# Patient Record
Sex: Female | Born: 1935 | Race: White | Hispanic: No | Smoking: Never smoker
Health system: Southern US, Community
[De-identification: ages and names within clinical notes are randomized; demographics above are authoritative.]

## PROBLEM LIST (undated history)

## (undated) DIAGNOSIS — E785 Hyperlipidemia, unspecified: Secondary | ICD-10-CM

## (undated) DIAGNOSIS — F411 Generalized anxiety disorder: Secondary | ICD-10-CM

## (undated) DIAGNOSIS — M8580 Other specified disorders of bone density and structure, unspecified site: Secondary | ICD-10-CM

## (undated) DIAGNOSIS — Z9289 Personal history of other medical treatment: Secondary | ICD-10-CM

## (undated) DIAGNOSIS — D62 Acute posthemorrhagic anemia: Secondary | ICD-10-CM

## (undated) DIAGNOSIS — J45909 Unspecified asthma, uncomplicated: Secondary | ICD-10-CM

## (undated) DIAGNOSIS — M1990 Unspecified osteoarthritis, unspecified site: Secondary | ICD-10-CM

## (undated) DIAGNOSIS — Z9889 Other specified postprocedural states: Secondary | ICD-10-CM

## (undated) DIAGNOSIS — I2510 Atherosclerotic heart disease of native coronary artery without angina pectoris: Secondary | ICD-10-CM

## (undated) DIAGNOSIS — K56600 Partial intestinal obstruction, unspecified as to cause: Secondary | ICD-10-CM

## (undated) DIAGNOSIS — E039 Hypothyroidism, unspecified: Secondary | ICD-10-CM

## (undated) DIAGNOSIS — G250 Essential tremor: Secondary | ICD-10-CM

## (undated) DIAGNOSIS — I10 Essential (primary) hypertension: Secondary | ICD-10-CM

## (undated) DIAGNOSIS — J309 Allergic rhinitis, unspecified: Secondary | ICD-10-CM

## (undated) DIAGNOSIS — F419 Anxiety disorder, unspecified: Secondary | ICD-10-CM

## (undated) DIAGNOSIS — I25810 Atherosclerosis of coronary artery bypass graft(s) without angina pectoris: Secondary | ICD-10-CM

## (undated) DIAGNOSIS — A419 Sepsis, unspecified organism: Secondary | ICD-10-CM

## (undated) DIAGNOSIS — K219 Gastro-esophageal reflux disease without esophagitis: Secondary | ICD-10-CM

## (undated) DIAGNOSIS — G894 Chronic pain syndrome: Secondary | ICD-10-CM

## (undated) DIAGNOSIS — J189 Pneumonia, unspecified organism: Secondary | ICD-10-CM

## (undated) DIAGNOSIS — G4700 Insomnia, unspecified: Secondary | ICD-10-CM

## (undated) DIAGNOSIS — R5381 Other malaise: Secondary | ICD-10-CM

## (undated) DIAGNOSIS — E119 Type 2 diabetes mellitus without complications: Secondary | ICD-10-CM

## (undated) HISTORY — DX: Unspecified osteoarthritis, unspecified site: M19.90

## (undated) HISTORY — DX: Other malaise: R53.81

## (undated) HISTORY — DX: Other specified disorders of bone density and structure, unspecified site: M85.80

## (undated) HISTORY — DX: Hyperlipidemia, unspecified: E78.5

## (undated) HISTORY — DX: Acute posthemorrhagic anemia: D62

## (undated) HISTORY — DX: Gastro-esophageal reflux disease without esophagitis: K21.9

## (undated) HISTORY — DX: Personal history of other medical treatment: Z92.89

## (undated) HISTORY — DX: Other specified postprocedural states: Z98.890

## (undated) HISTORY — DX: Allergic rhinitis, unspecified: J30.9

## (undated) HISTORY — DX: Essential tremor: G25.0

## (undated) HISTORY — DX: Generalized anxiety disorder: F41.1

## (undated) HISTORY — DX: Chronic pain syndrome: G89.4

## (undated) HISTORY — DX: Anxiety disorder, unspecified: F41.9

## (undated) HISTORY — DX: Sepsis, unspecified organism: J18.9

## (undated) HISTORY — DX: Atherosclerotic heart disease of native coronary artery without angina pectoris: I25.10

## (undated) HISTORY — DX: Partial intestinal obstruction, unspecified as to cause: K56.600

## (undated) HISTORY — DX: Hypothyroidism, unspecified: E03.9

## (undated) HISTORY — DX: Atherosclerosis of coronary artery bypass graft(s) without angina pectoris: I25.810

## (undated) HISTORY — DX: Sepsis, unspecified organism: A41.9

## (undated) HISTORY — DX: Essential (primary) hypertension: I10

## (undated) HISTORY — DX: Insomnia, unspecified: G47.00

---
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
## 1958-08-20 HISTORY — PX: DILATION AND CURETTAGE OF UTERUS: SHX78

## 1968-08-20 HISTORY — PX: OTHER SURGICAL HISTORY: SHX169

## 1968-08-20 HISTORY — PX: BREAST LUMPECTOMY: SHX2

## 1969-08-20 HISTORY — PX: VESICOVAGINAL FISTULA CLOSURE W/ TAH: SUR271

## 1978-08-20 HISTORY — PX: NASAL SINUS SURGERY: SHX719

## 1997-12-03 ENCOUNTER — Ambulatory Visit (HOSPITAL_COMMUNITY): Admission: RE | Admit: 1997-12-03 | Discharge: 1997-12-03 | Payer: Self-pay | Admitting: Family Medicine

## 1997-12-07 ENCOUNTER — Encounter: Admission: RE | Admit: 1997-12-07 | Discharge: 1997-12-07 | Payer: Self-pay | Admitting: Family Medicine

## 1997-12-07 ENCOUNTER — Ambulatory Visit (HOSPITAL_COMMUNITY): Admission: RE | Admit: 1997-12-07 | Discharge: 1997-12-07 | Payer: Self-pay | Admitting: Family Medicine

## 1997-12-15 ENCOUNTER — Encounter: Admission: RE | Admit: 1997-12-15 | Discharge: 1997-12-15 | Payer: Self-pay | Admitting: Family Medicine

## 1997-12-28 ENCOUNTER — Encounter: Admission: RE | Admit: 1997-12-28 | Discharge: 1997-12-28 | Payer: Self-pay | Admitting: Family Medicine

## 1998-01-06 ENCOUNTER — Encounter: Admission: RE | Admit: 1998-01-06 | Discharge: 1998-01-06 | Payer: Self-pay | Admitting: Family Medicine

## 1998-01-20 ENCOUNTER — Encounter: Admission: RE | Admit: 1998-01-20 | Discharge: 1998-01-20 | Payer: Self-pay | Admitting: Family Medicine

## 1998-02-15 ENCOUNTER — Encounter: Admission: RE | Admit: 1998-02-15 | Discharge: 1998-02-15 | Payer: Self-pay | Admitting: Family Medicine

## 1998-03-01 ENCOUNTER — Encounter: Admission: RE | Admit: 1998-03-01 | Discharge: 1998-03-01 | Payer: Self-pay | Admitting: Family Medicine

## 1998-03-29 ENCOUNTER — Encounter: Admission: RE | Admit: 1998-03-29 | Discharge: 1998-03-29 | Payer: Self-pay | Admitting: Family Medicine

## 1998-04-28 ENCOUNTER — Encounter: Admission: RE | Admit: 1998-04-28 | Discharge: 1998-04-28 | Payer: Self-pay | Admitting: Family Medicine

## 1998-05-24 ENCOUNTER — Encounter: Admission: RE | Admit: 1998-05-24 | Discharge: 1998-05-24 | Payer: Self-pay | Admitting: Family Medicine

## 1998-06-21 ENCOUNTER — Encounter: Admission: RE | Admit: 1998-06-21 | Discharge: 1998-06-21 | Payer: Self-pay | Admitting: Family Medicine

## 1998-07-12 ENCOUNTER — Encounter: Admission: RE | Admit: 1998-07-12 | Discharge: 1998-07-12 | Payer: Self-pay | Admitting: Family Medicine

## 1998-07-26 ENCOUNTER — Encounter: Admission: RE | Admit: 1998-07-26 | Discharge: 1998-07-26 | Payer: Self-pay | Admitting: Family Medicine

## 1998-07-28 ENCOUNTER — Encounter: Admission: RE | Admit: 1998-07-28 | Discharge: 1998-07-28 | Payer: Self-pay | Admitting: Family Medicine

## 1998-08-18 ENCOUNTER — Emergency Department (HOSPITAL_COMMUNITY): Admission: EM | Admit: 1998-08-18 | Discharge: 1998-08-18 | Payer: Self-pay | Admitting: Emergency Medicine

## 1998-08-23 ENCOUNTER — Encounter: Admission: RE | Admit: 1998-08-23 | Discharge: 1998-08-23 | Payer: Self-pay | Admitting: Family Medicine

## 1998-08-30 ENCOUNTER — Encounter: Admission: RE | Admit: 1998-08-30 | Discharge: 1998-08-30 | Payer: Self-pay | Admitting: Sports Medicine

## 1998-09-22 ENCOUNTER — Ambulatory Visit (HOSPITAL_COMMUNITY): Admission: RE | Admit: 1998-09-22 | Discharge: 1998-09-22 | Payer: Self-pay | Admitting: Cardiology

## 1998-09-26 DIAGNOSIS — Z9889 Other specified postprocedural states: Secondary | ICD-10-CM

## 1998-09-26 HISTORY — DX: Other specified postprocedural states: Z98.890

## 1998-09-30 ENCOUNTER — Observation Stay (HOSPITAL_COMMUNITY): Admission: AD | Admit: 1998-09-30 | Discharge: 1998-10-01 | Payer: Self-pay | Admitting: Cardiovascular Disease

## 1998-09-30 DIAGNOSIS — Z9889 Other specified postprocedural states: Secondary | ICD-10-CM

## 1998-09-30 HISTORY — DX: Other specified postprocedural states: Z98.890

## 1998-10-11 ENCOUNTER — Encounter: Admission: RE | Admit: 1998-10-11 | Discharge: 1998-10-11 | Payer: Self-pay | Admitting: Family Medicine

## 1998-10-19 ENCOUNTER — Encounter: Admission: RE | Admit: 1998-10-19 | Discharge: 1998-10-19 | Payer: Self-pay | Admitting: Family Medicine

## 1998-10-28 ENCOUNTER — Encounter: Admission: RE | Admit: 1998-10-28 | Discharge: 1998-10-28 | Payer: Self-pay | Admitting: Family Medicine

## 1998-11-15 ENCOUNTER — Encounter: Admission: RE | Admit: 1998-11-15 | Discharge: 1998-11-15 | Payer: Self-pay | Admitting: Family Medicine

## 1998-11-29 ENCOUNTER — Encounter: Admission: RE | Admit: 1998-11-29 | Discharge: 1998-11-29 | Payer: Self-pay | Admitting: Family Medicine

## 1998-12-13 ENCOUNTER — Encounter: Admission: RE | Admit: 1998-12-13 | Discharge: 1998-12-13 | Payer: Self-pay | Admitting: Sports Medicine

## 1998-12-27 ENCOUNTER — Encounter: Admission: RE | Admit: 1998-12-27 | Discharge: 1998-12-27 | Payer: Self-pay | Admitting: Family Medicine

## 1999-01-04 ENCOUNTER — Ambulatory Visit (HOSPITAL_COMMUNITY): Admission: RE | Admit: 1999-01-04 | Discharge: 1999-01-04 | Payer: Self-pay | Admitting: Family Medicine

## 1999-01-04 ENCOUNTER — Encounter: Payer: Self-pay | Admitting: Family Medicine

## 1999-01-17 ENCOUNTER — Inpatient Hospital Stay (HOSPITAL_COMMUNITY): Admission: AD | Admit: 1999-01-17 | Discharge: 1999-01-23 | Payer: Self-pay | Admitting: Family Medicine

## 1999-01-17 ENCOUNTER — Encounter: Admission: RE | Admit: 1999-01-17 | Discharge: 1999-01-17 | Payer: Self-pay | Admitting: Family Medicine

## 1999-01-17 ENCOUNTER — Encounter: Payer: Self-pay | Admitting: Family Medicine

## 1999-01-17 ENCOUNTER — Ambulatory Visit (HOSPITAL_COMMUNITY): Admission: RE | Admit: 1999-01-17 | Discharge: 1999-01-17 | Payer: Self-pay | Admitting: Family Medicine

## 1999-01-19 ENCOUNTER — Encounter: Payer: Self-pay | Admitting: Family Medicine

## 1999-02-07 ENCOUNTER — Encounter: Admission: RE | Admit: 1999-02-07 | Discharge: 1999-02-07 | Payer: Self-pay | Admitting: Family Medicine

## 1999-02-20 ENCOUNTER — Encounter: Admission: RE | Admit: 1999-02-20 | Discharge: 1999-02-20 | Payer: Self-pay | Admitting: Family Medicine

## 1999-02-28 ENCOUNTER — Encounter: Admission: RE | Admit: 1999-02-28 | Discharge: 1999-02-28 | Payer: Self-pay | Admitting: Family Medicine

## 1999-03-21 ENCOUNTER — Encounter: Admission: RE | Admit: 1999-03-21 | Discharge: 1999-03-21 | Payer: Self-pay | Admitting: Family Medicine

## 1999-04-04 ENCOUNTER — Encounter: Admission: RE | Admit: 1999-04-04 | Discharge: 1999-04-04 | Payer: Self-pay | Admitting: Family Medicine

## 1999-04-06 ENCOUNTER — Emergency Department (HOSPITAL_COMMUNITY): Admission: EM | Admit: 1999-04-06 | Discharge: 1999-04-06 | Payer: Self-pay | Admitting: Emergency Medicine

## 1999-04-06 ENCOUNTER — Encounter: Payer: Self-pay | Admitting: Emergency Medicine

## 1999-05-02 ENCOUNTER — Encounter: Admission: RE | Admit: 1999-05-02 | Discharge: 1999-05-02 | Payer: Self-pay | Admitting: Family Medicine

## 1999-05-23 ENCOUNTER — Encounter: Admission: RE | Admit: 1999-05-23 | Discharge: 1999-05-23 | Payer: Self-pay | Admitting: Sports Medicine

## 1999-06-06 ENCOUNTER — Encounter: Admission: RE | Admit: 1999-06-06 | Discharge: 1999-06-06 | Payer: Self-pay | Admitting: Family Medicine

## 1999-07-04 ENCOUNTER — Encounter: Admission: RE | Admit: 1999-07-04 | Discharge: 1999-07-04 | Payer: Self-pay | Admitting: Family Medicine

## 1999-08-01 ENCOUNTER — Encounter: Admission: RE | Admit: 1999-08-01 | Discharge: 1999-08-01 | Payer: Self-pay | Admitting: Sports Medicine

## 1999-08-24 ENCOUNTER — Encounter: Admission: RE | Admit: 1999-08-24 | Discharge: 1999-08-24 | Payer: Self-pay | Admitting: Family Medicine

## 1999-08-29 ENCOUNTER — Encounter: Admission: RE | Admit: 1999-08-29 | Discharge: 1999-08-29 | Payer: Self-pay | Admitting: Family Medicine

## 1999-09-26 ENCOUNTER — Encounter: Admission: RE | Admit: 1999-09-26 | Discharge: 1999-09-26 | Payer: Self-pay | Admitting: Family Medicine

## 1999-11-28 ENCOUNTER — Encounter: Admission: RE | Admit: 1999-11-28 | Discharge: 1999-11-28 | Payer: Self-pay | Admitting: Family Medicine

## 2000-01-31 ENCOUNTER — Encounter: Admission: RE | Admit: 2000-01-31 | Discharge: 2000-01-31 | Payer: Self-pay | Admitting: Family Medicine

## 2000-02-01 ENCOUNTER — Encounter: Payer: Self-pay | Admitting: Family Medicine

## 2000-02-01 ENCOUNTER — Ambulatory Visit (HOSPITAL_COMMUNITY): Admission: RE | Admit: 2000-02-01 | Discharge: 2000-02-01 | Payer: Self-pay | Admitting: Family Medicine

## 2000-03-20 ENCOUNTER — Ambulatory Visit (HOSPITAL_COMMUNITY): Admission: RE | Admit: 2000-03-20 | Discharge: 2000-03-20 | Payer: Self-pay | Admitting: Gastroenterology

## 2000-03-20 ENCOUNTER — Encounter: Payer: Self-pay | Admitting: Gastroenterology

## 2000-03-26 ENCOUNTER — Encounter: Admission: RE | Admit: 2000-03-26 | Discharge: 2000-03-26 | Payer: Self-pay | Admitting: Family Medicine

## 2000-04-19 ENCOUNTER — Encounter: Admission: RE | Admit: 2000-04-19 | Discharge: 2000-07-18 | Payer: Self-pay | Admitting: Gastroenterology

## 2000-05-29 ENCOUNTER — Encounter: Admission: RE | Admit: 2000-05-29 | Discharge: 2000-05-29 | Payer: Self-pay | Admitting: Family Medicine

## 2000-06-11 ENCOUNTER — Encounter: Admission: RE | Admit: 2000-06-11 | Discharge: 2000-06-11 | Payer: Self-pay | Admitting: Family Medicine

## 2000-06-17 ENCOUNTER — Encounter: Payer: Self-pay | Admitting: Family Medicine

## 2000-06-17 ENCOUNTER — Encounter: Admission: RE | Admit: 2000-06-17 | Discharge: 2000-06-17 | Payer: Self-pay | Admitting: Family Medicine

## 2000-06-18 ENCOUNTER — Encounter: Admission: RE | Admit: 2000-06-18 | Discharge: 2000-06-18 | Payer: Self-pay | Admitting: Family Medicine

## 2000-07-30 ENCOUNTER — Encounter: Admission: RE | Admit: 2000-07-30 | Discharge: 2000-07-30 | Payer: Self-pay | Admitting: Family Medicine

## 2000-09-10 ENCOUNTER — Encounter: Admission: RE | Admit: 2000-09-10 | Discharge: 2000-09-10 | Payer: Self-pay | Admitting: Family Medicine

## 2000-09-17 ENCOUNTER — Encounter: Admission: RE | Admit: 2000-09-17 | Discharge: 2000-09-17 | Payer: Self-pay | Admitting: Internal Medicine

## 2000-09-17 ENCOUNTER — Encounter: Payer: Self-pay | Admitting: Internal Medicine

## 2000-10-08 ENCOUNTER — Encounter: Admission: RE | Admit: 2000-10-08 | Discharge: 2000-10-08 | Payer: Self-pay | Admitting: Family Medicine

## 2000-11-05 ENCOUNTER — Encounter: Admission: RE | Admit: 2000-11-05 | Discharge: 2000-11-05 | Payer: Self-pay | Admitting: Family Medicine

## 2000-12-31 ENCOUNTER — Encounter: Admission: RE | Admit: 2000-12-31 | Discharge: 2000-12-31 | Payer: Self-pay | Admitting: Family Medicine

## 2001-03-11 ENCOUNTER — Encounter: Admission: RE | Admit: 2001-03-11 | Discharge: 2001-03-11 | Payer: Self-pay | Admitting: Family Medicine

## 2001-03-12 ENCOUNTER — Encounter: Payer: Self-pay | Admitting: Family Medicine

## 2001-03-12 ENCOUNTER — Ambulatory Visit (HOSPITAL_COMMUNITY): Admission: RE | Admit: 2001-03-12 | Discharge: 2001-03-12 | Payer: Self-pay | Admitting: Family Medicine

## 2001-05-06 ENCOUNTER — Encounter: Admission: RE | Admit: 2001-05-06 | Discharge: 2001-05-06 | Payer: Self-pay | Admitting: Family Medicine

## 2001-07-15 ENCOUNTER — Encounter: Admission: RE | Admit: 2001-07-15 | Discharge: 2001-07-15 | Payer: Self-pay | Admitting: Family Medicine

## 2001-09-30 ENCOUNTER — Encounter: Admission: RE | Admit: 2001-09-30 | Discharge: 2001-09-30 | Payer: Self-pay | Admitting: Family Medicine

## 2001-11-25 ENCOUNTER — Encounter: Admission: RE | Admit: 2001-11-25 | Discharge: 2001-11-25 | Payer: Self-pay | Admitting: Family Medicine

## 2001-12-24 ENCOUNTER — Encounter: Admission: RE | Admit: 2001-12-24 | Discharge: 2001-12-24 | Payer: Self-pay | Admitting: Family Medicine

## 2001-12-24 ENCOUNTER — Encounter: Payer: Self-pay | Admitting: Family Medicine

## 2001-12-31 ENCOUNTER — Encounter: Admission: RE | Admit: 2001-12-31 | Discharge: 2001-12-31 | Payer: Self-pay | Admitting: Family Medicine

## 2002-01-08 ENCOUNTER — Encounter: Admission: RE | Admit: 2002-01-08 | Discharge: 2002-01-08 | Payer: Self-pay | Admitting: Family Medicine

## 2002-02-09 ENCOUNTER — Encounter: Admission: RE | Admit: 2002-02-09 | Discharge: 2002-02-09 | Payer: Self-pay | Admitting: Family Medicine

## 2002-03-16 ENCOUNTER — Encounter: Admission: RE | Admit: 2002-03-16 | Discharge: 2002-03-16 | Payer: Self-pay | Admitting: Family Medicine

## 2002-04-28 ENCOUNTER — Encounter: Admission: RE | Admit: 2002-04-28 | Discharge: 2002-04-28 | Payer: Self-pay | Admitting: Family Medicine

## 2002-06-16 ENCOUNTER — Encounter: Admission: RE | Admit: 2002-06-16 | Discharge: 2002-06-16 | Payer: Self-pay | Admitting: Family Medicine

## 2002-07-14 ENCOUNTER — Encounter: Admission: RE | Admit: 2002-07-14 | Discharge: 2002-07-14 | Payer: Self-pay | Admitting: Family Medicine

## 2002-08-18 ENCOUNTER — Encounter: Admission: RE | Admit: 2002-08-18 | Discharge: 2002-08-18 | Payer: Self-pay | Admitting: Family Medicine

## 2003-08-21 HISTORY — PX: REVISION TOTAL KNEE ARTHROPLASTY: SHX767

## 2003-08-22 ENCOUNTER — Inpatient Hospital Stay (HOSPITAL_COMMUNITY): Admission: EM | Admit: 2003-08-22 | Discharge: 2003-08-22 | Payer: Self-pay | Admitting: Emergency Medicine

## 2003-11-24 ENCOUNTER — Encounter
Admission: RE | Admit: 2003-11-24 | Discharge: 2004-02-22 | Payer: Self-pay | Admitting: Physical Medicine & Rehabilitation

## 2004-03-07 ENCOUNTER — Inpatient Hospital Stay (HOSPITAL_COMMUNITY): Admission: RE | Admit: 2004-03-07 | Discharge: 2004-03-10 | Payer: Self-pay | Admitting: Orthopedic Surgery

## 2004-03-10 ENCOUNTER — Inpatient Hospital Stay (HOSPITAL_COMMUNITY)
Admission: RE | Admit: 2004-03-10 | Discharge: 2004-03-16 | Payer: Self-pay | Admitting: Physical Medicine & Rehabilitation

## 2004-08-24 ENCOUNTER — Ambulatory Visit: Payer: Self-pay | Admitting: Internal Medicine

## 2004-09-14 ENCOUNTER — Ambulatory Visit: Payer: Self-pay | Admitting: Internal Medicine

## 2004-10-20 ENCOUNTER — Ambulatory Visit: Payer: Self-pay | Admitting: Internal Medicine

## 2004-10-30 ENCOUNTER — Inpatient Hospital Stay (HOSPITAL_COMMUNITY): Admission: RE | Admit: 2004-10-30 | Discharge: 2004-11-02 | Payer: Self-pay | Admitting: Orthopedic Surgery

## 2005-01-16 ENCOUNTER — Ambulatory Visit: Payer: Self-pay | Admitting: Internal Medicine

## 2005-03-22 ENCOUNTER — Encounter: Admission: RE | Admit: 2005-03-22 | Discharge: 2005-03-22 | Payer: Self-pay | Admitting: Orthopedic Surgery

## 2005-04-27 ENCOUNTER — Encounter: Admission: RE | Admit: 2005-04-27 | Discharge: 2005-04-27 | Payer: Self-pay | Admitting: General Surgery

## 2005-05-24 ENCOUNTER — Ambulatory Visit: Payer: Self-pay | Admitting: Internal Medicine

## 2005-11-15 ENCOUNTER — Ambulatory Visit: Payer: Self-pay | Admitting: Internal Medicine

## 2005-11-22 ENCOUNTER — Inpatient Hospital Stay (HOSPITAL_COMMUNITY): Admission: RE | Admit: 2005-11-22 | Discharge: 2005-11-26 | Payer: Self-pay | Admitting: Orthopedic Surgery

## 2005-11-22 ENCOUNTER — Ambulatory Visit: Payer: Self-pay | Admitting: Physical Medicine & Rehabilitation

## 2006-03-03 ENCOUNTER — Emergency Department (HOSPITAL_COMMUNITY): Admission: EM | Admit: 2006-03-03 | Discharge: 2006-03-03 | Payer: Self-pay | Admitting: Emergency Medicine

## 2006-05-16 ENCOUNTER — Ambulatory Visit: Payer: Self-pay | Admitting: Internal Medicine

## 2006-12-20 ENCOUNTER — Ambulatory Visit: Payer: Self-pay | Admitting: Internal Medicine

## 2007-07-08 DIAGNOSIS — J302 Other seasonal allergic rhinitis: Secondary | ICD-10-CM

## 2007-07-08 DIAGNOSIS — F419 Anxiety disorder, unspecified: Secondary | ICD-10-CM

## 2007-07-08 DIAGNOSIS — J4520 Mild intermittent asthma, uncomplicated: Secondary | ICD-10-CM | POA: Insufficient documentation

## 2007-07-08 DIAGNOSIS — M129 Arthropathy, unspecified: Secondary | ICD-10-CM | POA: Insufficient documentation

## 2007-07-08 DIAGNOSIS — I2510 Atherosclerotic heart disease of native coronary artery without angina pectoris: Secondary | ICD-10-CM | POA: Insufficient documentation

## 2007-07-08 DIAGNOSIS — J3089 Other allergic rhinitis: Secondary | ICD-10-CM

## 2007-07-08 DIAGNOSIS — E119 Type 2 diabetes mellitus without complications: Secondary | ICD-10-CM | POA: Insufficient documentation

## 2007-07-23 ENCOUNTER — Encounter: Payer: Self-pay | Admitting: Internal Medicine

## 2007-07-23 ENCOUNTER — Ambulatory Visit: Payer: Self-pay | Admitting: Internal Medicine

## 2007-08-05 ENCOUNTER — Telehealth (INDEPENDENT_AMBULATORY_CARE_PROVIDER_SITE_OTHER): Payer: Self-pay | Admitting: *Deleted

## 2007-10-16 DIAGNOSIS — I2510 Atherosclerotic heart disease of native coronary artery without angina pectoris: Secondary | ICD-10-CM | POA: Insufficient documentation

## 2007-11-04 DIAGNOSIS — F411 Generalized anxiety disorder: Secondary | ICD-10-CM | POA: Insufficient documentation

## 2007-11-07 ENCOUNTER — Encounter: Admission: RE | Admit: 2007-11-07 | Discharge: 2007-11-07 | Payer: Self-pay | Admitting: Family Medicine

## 2007-12-22 DIAGNOSIS — Z9889 Other specified postprocedural states: Secondary | ICD-10-CM

## 2007-12-22 HISTORY — DX: Other specified postprocedural states: Z98.890

## 2008-03-04 ENCOUNTER — Encounter: Admission: RE | Admit: 2008-03-04 | Discharge: 2008-03-04 | Payer: Self-pay | Admitting: Family Medicine

## 2008-03-18 ENCOUNTER — Encounter: Admission: RE | Admit: 2008-03-18 | Discharge: 2008-03-18 | Payer: Self-pay | Admitting: Family Medicine

## 2008-03-30 DIAGNOSIS — Z9289 Personal history of other medical treatment: Secondary | ICD-10-CM

## 2008-03-30 HISTORY — DX: Personal history of other medical treatment: Z92.89

## 2009-05-24 DIAGNOSIS — J309 Allergic rhinitis, unspecified: Secondary | ICD-10-CM | POA: Insufficient documentation

## 2010-06-22 ENCOUNTER — Encounter: Payer: Self-pay | Admitting: Internal Medicine

## 2010-07-16 ENCOUNTER — Observation Stay (HOSPITAL_COMMUNITY)
Admission: EM | Admit: 2010-07-16 | Discharge: 2010-07-18 | Payer: Self-pay | Source: Home / Self Care | Admitting: Emergency Medicine

## 2010-07-17 ENCOUNTER — Encounter: Payer: Self-pay | Admitting: Internal Medicine

## 2010-09-21 NOTE — Letter (Signed)
Summary: [facility]  [facility]   Imported By: Sherian Rein 07/07/2010 09:20:50  _____________________________________________________________________  External Attachment:    Type:   Image     Comment:   External Document

## 2010-10-31 LAB — CBC
HCT: 38.8 % (ref 36.0–46.0)
Hemoglobin: 12.3 g/dL (ref 12.0–15.0)
MCHC: 31.7 g/dL (ref 30.0–36.0)
MCV: 82.4 fL (ref 78.0–100.0)
RDW: 14 % (ref 11.5–15.5)

## 2010-10-31 LAB — DIFFERENTIAL
Basophils Relative: 0 % (ref 0–1)
Eosinophils Relative: 4 % (ref 0–5)
Monocytes Absolute: 0.8 10*3/uL (ref 0.1–1.0)
Monocytes Relative: 11 % (ref 3–12)
Neutro Abs: 3.6 10*3/uL (ref 1.7–7.7)

## 2010-10-31 LAB — URINALYSIS, ROUTINE W REFLEX MICROSCOPIC
Bilirubin Urine: NEGATIVE
Ketones, ur: NEGATIVE mg/dL
Nitrite: NEGATIVE
Urobilinogen, UA: 0.2 mg/dL (ref 0.0–1.0)
pH: 5.5 (ref 5.0–8.0)

## 2010-10-31 LAB — BASIC METABOLIC PANEL
BUN: 20 mg/dL (ref 6–23)
BUN: 22 mg/dL (ref 6–23)
CO2: 30 mEq/L (ref 19–32)
Calcium: 9.3 mg/dL (ref 8.4–10.5)
Chloride: 102 mEq/L (ref 96–112)
Chloride: 99 mEq/L (ref 96–112)
Creatinine, Ser: 0.72 mg/dL (ref 0.4–1.2)
Creatinine, Ser: 0.86 mg/dL (ref 0.4–1.2)
GFR calc Af Amer: >60 mL/min (ref >60)
GFR calc non Af Amer: >60 mL/min (ref >60)
Glucose, Bld: 93 mg/dL (ref 70–99)
Potassium: 3.4 mEq/L — ABNORMAL LOW (ref 3.5–5.1)
Sodium: 140 mEq/L (ref 135–145)

## 2010-10-31 LAB — GLUCOSE, CAPILLARY
Glucose-Capillary: 127 mg/dL — ABNORMAL HIGH (ref 70–99)
Glucose-Capillary: 142 mg/dL — ABNORMAL HIGH (ref 70–99)
Glucose-Capillary: 147 mg/dL — ABNORMAL HIGH (ref 70–99)
Glucose-Capillary: 164 mg/dL — ABNORMAL HIGH (ref 70–99)

## 2010-10-31 LAB — POCT CARDIAC MARKERS
Myoglobin, poc: 117 ng/mL (ref 12–200)
Myoglobin, poc: 167 ng/mL (ref 12–200)
Troponin i, poc: <0.05 ng/mL (ref 0.00–0.09)
Troponin i, poc: <0.05 ng/mL (ref 0.00–0.09)

## 2010-10-31 LAB — URINE CULTURE
Colony Count: NO GROWTH
Culture  Setup Time: 201111281748

## 2010-10-31 LAB — HEMOGLOBIN A1C
Hgb A1c MFr Bld: 6.8 % — ABNORMAL HIGH (ref <5.7)
Mean Plasma Glucose: 148 mg/dL — ABNORMAL HIGH (ref <117)

## 2010-10-31 LAB — URINE MICROSCOPIC-ADD ON

## 2010-10-31 LAB — MAGNESIUM: Magnesium: 2 mg/dL (ref 1.5–2.5)

## 2011-01-02 NOTE — Assessment & Plan Note (Signed)
[facility]                             PULMONARY OFFICE NOTE   NAME:RAYLEBristyn, [name]                        MRN:          [id number]  DATE:07/23/2007                            DOB:          05-22-1925    PROBLEM:  1. Allergic rhinitis.  2. Asthmatic bronchitis.  3. Coronary disease.  4. Diabetes.  5. Arthritis.  6. Anxiety.   HISTORY:  Sore throat for the past 2 weeks.  About a week ago she had  stomach upset and stomach still burns a little.  Less nausea now.  Never  did vomit very much.  Has had some global headache.  She began wheezing  about 2 days ago, coughing some green sputum, and she is treating that  with Mucinex and her home nebulizer.  She has had some cortisone in each  ankle a while back.  Had a flu shot.   MEDICATION:  Meds were reviewed without changes noted.   OBJECTIVE:  BP 122/80, pulse 87, room air saturation 94%, weight 207.8  pounds.  She is overweight, not really in any distress but there is some  dry cough and mild bilateral wheeze noted today.  No accessory muscle  use or dullness, no neck vein distention or edema.  HEART SOUNDS:  Regular without murmur.   IMPRESSION:  Exacerbation of asthmatic bronchitis.   PLAN:  1. Z-Pak.  2. Depo-Medrol 80 mg IM.  3. Xopenex 1.25 mg neb treatment.  4. Home meds include nebulizer with albuterol and ipratropium.  5. Schedule return in 6 months, but earlier p.r.n.     Clinton D. Maple Hudson, MD, Tonny Bollman, FACP  Electronically Signed    CDY/MedQ  DD: 07/26/2007  DT: 07/27/2007  Job #: [id number]   cc:   Veverly Fells. Altheimer, M.D.

## 2011-01-05 NOTE — Assessment & Plan Note (Signed)
[facility]                             PULMONARY OFFICE NOTE   NAME:[name], [name]                        MRN:          [id number]  DATE:12/20/2006                            DOB:          Sep 05, 1924    PROBLEM:  1. Allergic rhinitis.  2. Asthmatic bronchitis.  3. Coronary disease.  4. Diabetes.  5. Arthritis.  6. Anxiety.   HISTORY:  She feels her asthma is doing fairly well this spring. Not  bothered by pollen exposures but she is staying in a great deal. Her  activity and outdoor exposure are limited because of her chronic knee  problems.  There have been no acute events and no significant changes.  Medications were reviewed without change.   OBJECTIVE:  Weight 206 pounds, BP 128/70, pulse regular, 71. Room air  saturation 95%.  She is obese, moving with a cane. Nasal airway is clear  with no post nasal drainage or stridor.   Chest is clear and quiet, breathing is unlabored.  Heart sounds are  regular without murmur.   IMPRESSION:  Asthmatic bronchitis and previously significant allergic  rhinitis, not currently a problem.   PLAN:  We have refilled her albuterol rescue inhaler for p.r.n. use.  Schedule to return in 6 months, earlier p.r.n.     Clinton D. Maple Hudson, MD, Tonny Bollman, FACP  Electronically Signed    CDY/MedQ  DD: 12/22/2006  DT: 12/22/2006  Job #: [id number]   cc:   Veverly Fells. Altheimer, M.D.

## 2011-01-05 NOTE — Procedures (Signed)
[facility]  Patient:    [name], [name]                          MRN: [id number] Proc. Date: 03/20/00 Attending:  Florencia Reasons, M.D. CC:         Deniece Portela A. Sheffield Slider, M.D.             Thereasa Solo. Little, M.D.             Clinton D. Maple Hudson, M.D.                           Procedure Report  PROCEDURE PERFORMED:  Screening colonoscopy (attempted).  ENDOSCOPIST:  Florencia Reasons, M.D.  INDICATIONS:  A 77 year old for cancer screening.  There has been a subtle change in bowel habits recently.  There is also a remote history of ischemic colitis.  FINDINGS:  Sigmoid fixation with inability to pass the scope beyond the region of the distal descending colon.  DESCRIPTION OF PROCEDURE:  The nature, purpose and risk of the procedure have been discussed with the patient and she provided written consent.  Sedation for this procedure and the upper endoscopy which preceded it totalled fentanyl 150 mcg and Versed 10 mg IV without arrhythmias or desaturation (she went down to about 90% periodically during the procedure).  The procedure was initiated with the Olympus adult video colonoscope which was able to be advanced to about 50 cm or so and felt to correspond to perhaps the proximal sigmoid region.  However, the scope would seem to "lock" as I tried to advance it beyond that point.  It is not clear whether this is simply due to fixation or some form of looping, but I switched to the pediatric colonoscope which essentially the same results, and therefore decided to terminate the procedure.  Up to the limit of the exam, this was a normal exam without evidence of polyps, cancer, colitis, vascular malformations or diverticular disease, and there were no obvious post-ischemic strictures.  Retroflexion of the rectum was unremarkable although there were some mild to moderate internal hemorrhoids present, best seen during pull out through the anal canal.  No biopsies were  obtained.  The patient tolerated the procedure well and there were no apparent complications.  IMPRESSION:  Normal limited exam of the colon.  Unable to get beyond the region of the distal descending colon due to apparent fixation and/or looping of the scope.  PLAN:  Proceed to single-column barium enema while the patient is prepped, for screening purposes. DD:  03/20/00 TD:  03/21/00 Job: [id number]

## 2011-01-05 NOTE — Discharge Summary (Signed)
[name], [name]                 ACCOUNT NO.:  [id number]   MEDICAL RECORD NO.:  [id number]          PATIENT TYPE:  INP   LOCATION:  0454                         FACILITY:  [facility]   PHYSICIAN:  Annette Frankel. Charlann [name], M.D.  DATE OF BIRTH:  December 07, 1924   DATE OF ADMISSION:  10/30/2004  DATE OF DISCHARGE:  11/02/2004                                 DISCHARGE SUMMARY   ADMITTING DIAGNOSES:  1.  Right knee arthrofibrosis, associated with patella clunk phenomenon.  2.  Hypertension.  3.  Coronary artery disease.  4.  Chronic obstructive pulmonary disease.  5.  Anxiety.  6.  Acid reflux.  7.  Diabetes.  8.  Hypothyroidism.  9.  Osteoarthritis.   DISCHARGE DIAGNOSES:  1.  Right knee arthrofibrosis, associated with patella clunk phenomenon.  2.  Hypertension.  3.  Coronary artery disease.  4.  Chronic obstructive pulmonary disease.  5.  Anxiety.  6.  Acid reflux.  7.  Diabetes.  8.  Hypothyroidism.  9.  Osteoarthritis.   OPERATION:  On October 30, 2004 the patient underwent right knee diagnostic  and operative arthroscopy, with extensive tricompartmental synovectomy and  debridement of scar tissue; re-establishing suprapatellar pouch, medial and  lateral gutters and anterior aspect of the knee.   BRIEF HISTORY:  This 77 year old lady underwent right total knee replacement  arthroplasty in or about July 2005.  Recently she had mechanical symptoms of  the knees, primarily with flexion extension.  Patellar clunk was noted on  examination.  She is quite uncomfortable and had an essentially  deteriorating situation after her total knee last year.  She is now for  surgical intervention.   HOSPITAL COURSE:  The patient tolerated the surgical procedure quite well.  She was placed on CPM.  She was ambulating in the hall, doing quite well  with her knee.  The main complaint was low back pain, consistent and  persistent and radiating to both buttocks.  She had a negative straight-leg  raise.   Sensory was intact.  Motor was intact in the lower extremities.   X-rays taken of the lumbar spine revealed degenerative disk disease, with no  acute abnormality.   It was felt that as long as her back was concerned, she could follow up with  one of our spine surgeons (as far as her knee was concerned).  Home Health  was arranged.  On the day of discharge she was given a Medrol dose pack --  primarily for her back as well as muscle relaxers.  We started the Medrol  dose pack the day prior to discharge; she improved as far has her back was  concerned.  She had minimal discomfort there.   She will start out with outpatient in our office on November 09, 2004.  The  wound was dry.  Neurovascular was intact in the operative extremity.   LABORATORY VALUES:  (In the hospital)  Hematologically showed a very mild  normocytic/normochromic hemoglobin and anemia; with a hemoglobin at 11.7,  hematocrit 35.6.  Blood chemistries were essentially normal.  A back x-ray  showed degenerative  disk disease, and no acute abnormality.  No  electrocardiogram or chest x-ray seen on this chart.   CONDITION ON DISCHARGE:  Improved.   PLAN:  The patient is discharged to her home.  Continue with weightbearing  as tolerated.  Followup with Dr. Otelia Sergeant for her back.  Return to see Dr. Charlann [name]  in two weeks after the date of surgery.  She is to continue with her home  medications and diet.  Vicodin is given for discomfort, and Robaxin 750 mg  as a muscle relaxant.  She is to continue with her Medrol dose pack; which  was started in the hospital.  Call with any problems.  Do a dry dressing  p.r.n.Marland Kitchen      DLU/MEDQ  D:  11/13/2004  T:  11/13/2004  Job:  [id number]

## 2011-01-05 NOTE — H&P (Signed)
NAMESHARETHA, NEWSON                           ACCOUNT NO.:  [id number]   MEDICAL RECORD NO.:  [id number]                   PATIENT TYPE:  INP   LOCATION:  NA                                   [facility]ACILITY[facility]  [facility]   PHYSICIAN:  Madlyn Frankel. Charlann Boxer, M.D.               DATE OF BIRTH:  [birth year]   DATE OF ADMISSION:  DATE OF DISCHARGE:                                HISTORY & PHYSICAL   DATE OF ADMISSION:  March 07, 2004   CHIEF COMPLAINT:  Right knee pain.   HISTORY OF PRESENT ILLNESS:  The patient is a 77 year old female who Dr.  Charlann Boxer has been following for an considerable amount of time.  She was last  seen in the office on Jan 18, 2004 when in prior visits she was noted to  have bone-on-bone degenerative changes primarily in the medial compartment  of the right knee.  She has previously undergone conservative treatment  including steroid injections and antiinflammatories which has not helped her  symptoms at all.  She is getting some relief from a Lidoderm patch that she  has obtained from a pain clinic; however, she continues to have symptoms.  Radiographs in the office revealed bone-on-bone arthritis in the medial  compartment.  Upon reviewing these x-rays and the patient's failed  conservative measures, Dr. Charlann Boxer feels at this point it is best to proceed  with a right total knee arthroplasty and the patient agrees.  The risks and  benefits of the surgery were discussed with the patient and the patient  wishes to proceed.   PAST MEDICAL HISTORY:  1. Gastroesophageal reflux disease.  2. Hypercholesterolemia.  3. Hypertension.  4. Diabetes mellitus.  5. Hypothyroidism.  6. Asthma.  7. Irritable bowel syndrome.   PAST SURGICAL HISTORY:  1. Appendectomy.  2. Hysterectomy.  3. Bladder tack.  4. Two breast surgeries.  5. Cataract surgery on the left.  6. Angioplasty.   MEDICATIONS:  1. Chlorazepate 15 mg one p.o. daily.  2. Astelin dose and schedule not known.  3. Advair 100/50  inhaled b.i.d.  4. Prilosec dosage and schedule not known.  5. Singulair 10 mg p.o. q.h.s.  6. Lipitor 40 mg p.o. q.h.s.  7. Lexapro 10 mg p.o. q.h.s.  8. Toprol-XL 50 mg p.o. q.h.s.  9. Micardis HCT 80 mg/12.5 mg one p.o. q.a.m.  10.      Zetia 10 mg one p.o. q.a.m.  11.      Actos 30 mg one p.o. q.a.m.  12.      Wellbutrin XL 150 mg one p.o. q.a.m.  13.      Isosorbide mononitrate 30 mg one p.o. q.a.m.  14.      Synthroid 75 mcg one p.o. q.a.m.  15.      Albuterol inhaler 17 mcg p.r.n.  16.      NitroQuick 0.4 mg p.r.n.  17.  Mucinex 600 mg schedule not known.   ALLERGIES:  PHENOBARBITAL causes hives.   SOCIAL HISTORY:  The patient denies any tobacco or alcohol use.  She is a  widow.  She lives in a one-story house with two steps entering her house and  her son and granddaughter will be caregivers after surgery.   FAMILY MEDICAL HISTORY:  Coronary artery disease, hypertension,  cerebrovascular accident, osteoarthritis.   REVIEW OF SYSTEMS:  GENERAL:  Denies fevers, chills, night sweats, bleeding  tendencies.  CNS:  Denies blurry or double vision, seizures, headaches,  paralysis.  RESPIRATORY:  Positive shortness of breath.  Denies productive  cough, hemoptysis.  CV:  Denies chest pain, angina, orthopnea.  GI:  Positive diarrhea. Denies nausea, vomiting, constipation, melena, or bloody  stools.  GU:  Positive frequency.  Denies dysuria, hematuria, or discharge.  MUSCULOSKELETAL:  As pertinent to HPI.   PHYSICAL EXAMINATION:  VITAL SIGNS:  Blood pressure 140/80, pulse 76,  respirations 16.  GENERAL:  Well-developed, well-nourished 77 year old female.  HEENT:  Normocephalic, atraumatic.  Pupils equal, round, react to light.  NECK:  Supple, no carotid bruit noted.  CHEST:  Clear to auscultation bilaterally.  No wheezes or crackles.  HEART:  Regular rate and rhythm.  No murmurs, rubs, or gallops.  ABDOMEN:  Soft, nontender, nondistended, positive bowel sounds x4.   EXTREMITIES:  Range of motion of the right knee reveals a near full  extension with a lag of about 5 degrees.  She is able to extend to 100  degrees with pain.  She has stable ligaments to pseudolaxity medially.  She  has intact MCL and she is neurovascularly intact to the right lower  extremity.  SKIN:  No rashes or lesions.   Radiographs reveal bone-on-bone arthritis in the medial compartment of the  right knee.   IMPRESSION:  1. Osteoarthritis of the right knee.  2. Gastroesophageal reflux disease.  3. Hypercholesterolemia.  4. Hypertension.  5. Diabetes mellitus.  6. Hypothyroidism.  7. Asthma.  8. Irritable bowel syndrome.   PLAN:  The patient will be admitted to [facility] on March 07, 2004  and undergo right total knee arthroplasty by Dr. Durene Romans.     Clarene Reamer, P.A.-C.                   Madlyn Frankel Charlann Boxer, M.D.    SW/MEDQ  D:  03/06/2004  T:  03/06/2004  Job:  [id number]   cc:   Veverly Fells. Altheimer, M.D.  [address]  [place]  [place] [address]  Fax: [phone]

## 2011-01-05 NOTE — Consult Note (Signed)
[name], [name]                           ACCOUNT NO.:  [id number]   MEDICAL RECORD NO.:  [id number]                   PATIENT TYPE:  INP   LOCATION:  1828                                 FACILITY:  [facility]   PHYSICIAN:  Oley Balm. Sung Amabile, M.D. [facility]          DATE OF BIRTH:  December 10, 1924   DATE OF CONSULTATION:  DATE OF DISCHARGE:                                   CONSULTATION   REQUESTING PHYSICIAN:  Trudi Ida. Denton Lank, M.D.   REASON FOR CONSULTATION:  Acute asthma exacerbation.   HISTORY OF PRESENT ILLNESS:  The patient is a 77 year old patient of Dr.  Joni Fears D. Young with a 45-year history of asthma.  Purportedly, she only  uses nebulized albuterol which she uses on a daily basis several times.  She  presents with seven days of subjective fever, cough productive of yellow  mucus, dyspnea and chest tightness.  She was treated in the emergency  department with nebulized bronchodilators, and it improved significantly.  Dr. Denton Lank believed she was ready for discharge, but the patient was  reluctant to go home due to the fact that she lives alone.  Consequently I  was called to admit her for inpatient therapy.  After a further discussion  about our options, she is willing to go home and will have her brother who  lives one street away keep a close eye on her.  She also informs me that she  has followup scheduled with Dr. Joni Fears D. Young on August 23, 2003.  She  denies pleuritic and anginal chest pain.  She denies hemoptysis and lower-  extremity edema.  There is no nausea, vomiting, diarrhea or dysuria.   PAST MEDICAL HISTORY:  1. Asthma.  2. Hypertension.  3. Diabetes.  4. Hysterectomy.  5. Appendectomy.  6. Right knee surgery x2.  7. Coronary artery disease, status post percutaneous angioplasty several     years ago.   CURRENT MEDICATIONS:  1. She is uncertain of her current medications.  2. Her only asthma medication that she know of is albuterol.  3. She takes Actos for  diabetes.  4. She takes a hypertension medication.  5. She does not know the rest of her medications.   SOCIAL HISTORY:  She has never smoked.  No history of significant  occupational exposures.   FAMILY HISTORY:  Noncontributory.   REVIEW OF SYSTEMS:  As per the history of present illness.   PHYSICAL EXAMINATION:  VITAL SIGNS: Afebrile with normal vital signs.  GENERAL:  She is well developed, well nourished but in no acute cardiac or  respiratory distress.  HEENT:  Head and neck exam revealed no acute abnormalities.  CHEST:  Examination reveals a normal percussion noted throughout.  BREASTS:  Sounds are full with coarse scattered expiratory wheezes.  CARDIAC:  Exam reveals a regular rate and rhythm, no murmurs.  ABDOMEN:  Obese, soft, nontender with normal bowel sounds.  EXTREMITIES:  Without clubbing, cyanosis or edema.  NEUROLOGIC:  Reveals no focal deficits.   LABORATORY DATA:  Chest x-ray shows no evidence of edema.  I do not believe  there are any infiltrates seen though the radiologist did raise the concern  for a right middle lobe infiltrate.  I believe these are chronic changes and  are seen on prior films.  CBC and basic metabolic panel are okay.   IMPRESSION:  Asthma exacerbation, relatively mild.  I believe the patient is  stable for discharge to home.  Notably, she does not appear to be on a  controller medications such as an inhaled steroid.  I wonder if this is due  to the fact that she does not understand her medical regimen. I feel certain  that it is not Dr. Roxy Cedar intention that she be on nebulized albuterol  only.   PLAN/RECOMMENDATIONS:  We will treat her with a course of prednisone 60 mg  x3 days, 40 mg x3 days, 20 mg x3 days, stop.  Also a course of Avelox 400 mg  a day x5 days.  I have instructed her to follow up with Dr. Maple Hudson tomorrow  as scheduled. I have instructed her to use the nebulized bronchodilators up  to every four hours as needed.  Upon  followup with Dr. Maple Hudson, she needs to  discuss a maintenance regimen for asthma.                                               Oley Balm Sung Amabile, M.D. [facility]    DBS/MEDQ  D:  08/22/2003  T:  08/22/2003  Job:  [id number]   cc:   Joni Fears D. Young, M.D.  [address] [place]  [place] [address]  Fax: [phone]   Veverly Fells. Altheimer, M.D.  [address], [address]  [place]  [place] [address]  Fax: [phone]

## 2011-01-05 NOTE — Assessment & Plan Note (Signed)
REFERRING PHYSICIAN:  Veverly Fells. Altheimer, M.D.   A 77 year old female with several year history of right knee pain as well as  low back pain both made worse by walking as well as doing housework,  relieved by sitting as well as medication.  She was last seen by me in  initial evaluation on November 26, 2003.  We switched her from OxyContin 20  b.i.d. to just hydrocodone 5/500 one p.o. t.i.d. She has had no new medical  complications since I last saw her.  She is interested in getting knee  surgery but has been told that she could not tolerate it from a cardiac  standpoint.  She wants to check out whether this applies to just general  anesthesia problems or whether this would apply to local as well.   In terms of her knee pain, her Lidoderm patch has been a life saver, She  has worn them at night mainly rather than during the day time as this is  helping with her sleep.   PAST MEDICAL HISTORY:  1. History of pneumonia in January, hospitalized for recurrence.  2. Diabetes onset February 2004.  3. Hypertension.  4. Dyslipidemia.  5. Hypothyroidism.  6. Asthma.  7. Irritable bowel syndrome.  8. Depression.   CONSULTATIONS:  1. Thereasa Solo. Little, M.D., cardiology.  2. Clinton D. Maple Hudson, M.D., pulmonary.  3. Bernette Redbird, M.D., GI.   CURRENT MEDICATIONS:  1. Singulair.  2. Synthroid.  3. Lexapro.  4. Zetia.  5. Actos.  6. Wellbutrin.  7. Toprol.  8. Isosorbide dinitrate.  9. Nexium.  10.      Chlorazepate.  11.      Lipitor.  12.      Advair.  13.      Micardis.  14.      Hydrocodone 5/500 one p.o. t.i.d.  15.      Lidoderm patches to the right knee on 12 and off 12.   SOCIAL HISTORY:  She lives with her brother.  Nonsmoker.  No history of  alcohol abuse.   PAIN EXACERBATING FACTORS:  Walking.   IMPROVING FACTORS:  Rest as well as patch medication.   Her average pain is now 3/10, going up sometimes to 5, and depends on how  much she wants to do in terms of housework  which she is now doing again.   REVIEW OF SYMPTOMS:  Weakness in the right leg only.  No longer has any  depression, anxiety symptoms.   PHYSICAL EXAMINATION:  VITAL SIGNS:  Blood pressure 129/66, pulse 68,  respiratory rate 14, O2 saturation 96%.  NEUROLOGIC:  Gait is with antalgia favoring right side slightly wide base of  support.  Affect is alert.  Appearance is normal.   She has good forward flexion approximately 75%, extension to 50%, lateral  rotation 50%.  Motor examination 4/5 bilateral deltoid, biceps, triceps,  grip, hip flexion, knee extension, ankle dorsiflexion.  He has 0 to 120  degree knee range of motion bilaterally.  Good hip range of motion  bilaterally without any pain with internal or external rotation of the hip.  Faber's test shows no evidence of pain in the SI area.  Deep tendon reflexes  are normal.  She has tenderness to palpation right knee.  No evidence of  fusion or erythema.   IMPRESSION:  1. Right knee osteoarthritis with chronic right greater than left knee pain.  2. Lumbar pain, possible stenosis.  She does have a somewhat stenotic  appearing gait.  I do not have the MRIs done at [facility].  3. Balance multifactorial.  No falls recently.  Uses a cane.  4. Irritable bowel syndrome.  Tendency toward diarrhea.  5. Depression which seems to have improved with her pain management.  She is     on Lexapro 10 mg a day as well.   PLAN:  1. She has gone through home health PT and OT and has followed up with home     exercise program.  She is quite happy with this.  Continue Lidoderm patch     to the right knee.  I told her I can write a prescription for the left     knee but she states that really her right knee just needs it.  2. Hydrocodone 5/500 one p.o. t.i.d.  There is a couple of days a week where     she might use an extra tablet and to that effect, I will write her     instead of 90 tablets a month, 100 tablets a month and monitor her  for     signs of dosage escalation.  If so, then would have to switch to a longer     acting medication.   I will see her back in six to eight weeks.      Erick Colace, M.D.   AEK/MedQ  D:  12/24/2003 12:49:19  T:  12/24/2003 14:49:04  Job #:  [id number]   cc:   Veverly Fells. Altheimer, M.D.  [address]  [place]  [place] [address]  Fax: [phone]   Madlyn Frankel. Charlann Boxer, M.D.  [facility]  [address]  [address]  [place]  [place] [address]  Fax: [phone]

## 2011-01-05 NOTE — Assessment & Plan Note (Signed)
[facility]                               PULMONARY OFFICE NOTE   NAME:[name], Ferner                        MRN:          [id number]  DATE:05/16/2006                            DOB:          06-28-1925    PROBLEM LIST:  1. Allergic rhinitis.  2. Asthmatic bronchitis.  3. Coronary disease.  4. Diabetes.  5. Arthritis.  6. Anxiety.   HISTORY:  She says she had no respiratory problems with knee surgery in  April  under spinal anesthesia.  She stayed in most of the summer after her  surgery and to avoid the air quality and weather issues and heat.  She had a  rash, she says was not shingles but she got the shingles vaccine and Dr.  Leslie Dales gave her a flu shot for this year in early September.  She says  her breathing is good right now as far as she is concerned.   MEDICATIONS:  Singulair, Prilosec, Wellbutrin, Advair 100/50, Lexapro,  Lipitor, Actos, Toprol, home nebulizer with albuterol and ipratropium used  rarely, Allegra, albuterol metered inhaler, Astelin, Mucinex, occasional  Benadryl or tranxene.   ALLERGIES:  DRUG INTOLERANCE TO DOXYCYCLINE AND SPIRIVA IS AVOIDED BECAUSE  OF GLAUCOMA.   OBJECTIVE:  VITAL SIGNS:  Weight 204 pounds, blood pressure 122/78, pulse  regular 63, room air saturation 93%.  CHEST:  Quiet chest, shallow respiratory pattern consistent with her obese  body habitus but unlabored.  HEART:  Sounds regular without murmur or gallop.  HEENT: Nasal airway is clear.  SKIN:  I see no visible rash now corresponding to her description from  earlier.  EXTREMITIES:  There is no edema in the feet.   IMPRESSION:  Allergic rhinitis and asthma with bronchitis are currently  stable.   PLAN:  No change to offer.  Schedule return in six months.  As much as she  is able, I have encouraged her to walk for endurance and weight loss.       Clinton D. Maple Hudson, MD, FCCP, FACP      CDY/MedQ  DD:  05/19/2006  DT:  05/20/2006  Job #:  [id number]   cc:   Veverly Fells. Altheimer, M.D.  Thereasa Solo. Little, M.D.  Madlyn Frankel Charlann Boxer, M.D.

## 2011-01-05 NOTE — Discharge Summary (Signed)
[name], [name]                           ACCOUNT NO.:  [id number]   MEDICAL RECORD NO.:  [id number]                   PATIENT TYPE:  INP   LOCATION:  0474                                 FACILITY:  [facility]   PHYSICIAN:  Madlyn Frankel. Charlann Boxer, M.D.               DATE OF BIRTH:  03/17/1925   DATE OF ADMISSION:  03/07/2004  DATE OF DISCHARGE:  03/10/2004                                 DISCHARGE SUMMARY   ADMISSION DIAGNOSES:  1. Osteoarthritis of right knee.  2. Gastroesophageal reflux disease.  3. Hypercholesterolemia.  4. Hypertension.  5. Diabetes mellitus.  6. Hypothyroidism.  7. Asthma.  8. Irritable bowel syndrome.   DISCHARGE DIAGNOSES:  1. Osteoarthritis of right knee.  2. Gastroesophageal reflux disease.  3. Hypercholesterolemia.  4. Hypertension.  5. Diabetes mellitus.  6. Hypothyroidism.  7. Asthma.  8. Irritable bowel syndrome.  9. Mild postoperative anemia with hyponatremia (mild).   OPERATION:  On March 03, 2004, the patient underwent DePuy Sigma knee system  with cobalt chromium tray, all 3 components cemented.  Jenne Campus  assisted.   CONSULTS:  Dr. Casimiro Needle Altheimer   BRIEF HISTORY:  This is 77 year old lady with persistent problems concerning  her right knee.  This has been going on for well over 2 years.  The patient  was cleared for this surgical procedure by Dr. Leslie Dales.  X-rays have shows  significant bone-on-bone deformity in the medial compartment of the right  knee.  She had a deformity to the knee as well as beginning limitation of  range of motion secondary to the pain and actual mechanical deformity.  After much discussion and the clearance admission above, it was decided the  patient would benefit from surgical intervention and admitted for the above  procedure.   COURSE IN THE HOSPITAL:  The patient tolerated the surgical procedure quite  well.  She was placed in the CPM protocol for total knee postoperatively and  tolerated 60 degrees  quite well right off.  Dr. Leslie Dales saw the patient  and followed her very closely throughout her entire hospitalization,  adjusting her medications as indicated.  The patient was placed on Coumadin  protocol for postoperative prevention of DVT.  It was felt that since this  was such an active lady, prior to onset of present illness, she would  benefit from intensive inpatient rehabilitation program.  Dr. Thomasena Edis saw  the patient from the rehab department at [facility] and agreed.  We  worked very carefully with discharge planning and were able to continue with  the postop protocol until the time for transfer.  Dr. Leslie Dales saw the  patient on the day of discharge and was aware of her transfer.  She did have  slightly elevated creatinine, and she was given some Lasix.   On the day of discharge, neurovascular was intact to the operative  extremity.  Wound was dry.  The calf was soft and Homans was negative.   LABORATORY VALUES IN THE HOSPITAL:  Hematologically showed a CBC with  differential on admission completely within normal limits.  Her hemoglobin  dropped to 9.6 with hematocrit of 29.4  She was asymptomatic as far as  ambulation was concerned.  Blood chemistries were essentially normal other  than the slightly elevated glucose at 148, and the sodium was 130 at  discharge.  Urinalysis was essentially negative for urinary tract infection.  Her chest x-ray preoperatively showed right middle lobe air space disease.  That was ready by Jolaine Click, MD.  Electrocardiogram showed normal sinus  rhythm with multiple __________ criteria for LVH which may be normal  variant.   CONDITION ON DISCHARGE:  Improved, stable.   PLAN:  The patient is transferred to [facility] to continue with an  inpatient rehabilitation program.  She is to continue with flexion with CPM,  active and passive to the operative extremity.  She may weightbear as  tolerated.  Please notify Dr. Leslie Dales  should she have any medication  questions concerning this patient's care.   DISCHARGE MEDICATIONS:  1. Coumadin protocol.  2. Senokot 1 tab b.i.d.  3. Trinsicon 1 cap t.i.d.  4. Singular p.o. q.h.s.  5. Advair 100/50 Diskus b.i.d.  6. Avapro 300 mg tab daily.  7. Hydrochlorothiazide 12.5 mg cap daily.  8. Toprol XL 1 p.o. h.s.  9. Imdur 30 mg tab 1 daily.  10.      Zocor 20 mg 1 daily.  11.      Protonix 40 mg EC, 1 daily WC.  12.      Zetia 10 mg tab 1 daily.  13.      Synthroid/Levo thyroid 75 mcg tab 1 daily.  14.      Actos 30 mg tab 1 daily WC.  15.      Lexapro 10 mg tab q.h.s.  16.      Wellbutrin 150 mg XL tab daily.  17.      Robaxin 500 mg p.r.n. muscle spasm.  She only required mild     analgesics.     Dooley L. Cherlynn June.                 Madlyn Frankel Charlann Boxer, M.D.    DLU/MEDQ  D:  03/10/2004  T:  03/10/2004  Job:  [id number]   cc:   Veverly Fells. Altheimer, M.D.  [address], [address]  [place]  [place] [address]  Fax: [phone]

## 2011-01-05 NOTE — Group Therapy Note (Signed)
CHIEF COMPLAINT:  Right knee pain and back pain.   HISTORY OF PRESENT ILLNESS:  A 77 year old female with a several year  history of right knee pain as well as low back pain.  Her back pain and the  knee pain are both made worse by walking and doing her housework.  Relieved  by sitting as well as medication.  She had been followed by Dr. Renaldo Fiddler  in the past for pain management.  She currently is seeing Dr. Lajoyce Corners in  regards to right knee.  She has had two arthroscopic surgeries for the knee.  She has been told by her Cardiologist that it would not be safe for her to  undergo knee replacement surgery.  She has been on OxyContin 20 mg b.i.d.  per Dr. Lew Dawes, as well as Oxycodone 5 mg q.i.d.  She felt that she did just  as well on the short acting medication.  She has not had any injections for  her low back.  However, she did have a right knee injection per Dr. Charlann Boxer  last week.  She states that the knee injections are helpful temporarily for  her.   Functionally, she is able to ambulate but uses a cane to only go household  distances.  She showers in a standing position.  Does not have a tube, bench  or shower chair.  She has difficulty in getting up and sitting down on low  surfaces.  She has had a fall when she was trying to sit back down.  She  notes that her pain ranges from a 2-8/10 averaging 7.   PAST MEDICAL HISTORY:  1. Recent pneumonia in January, hospitalized.  2. Diabetic onset diagnosed February 2004 with hemoglobin A1C most recently     measured at 6.7.  3. Hypertensive, seen by Dr. Clarene Duke from Cardiology.  4. Dyslipidemia.  5. Hypothyroidism.  6. Asthma, sees Dr. Jetty Duhamel.  7. Irritable bowel, see Dr. Matthias Hughs.  8. Depression.   CURRENT MEDICATIONS:  1. Singulair 10 mg p.o. daily.  2. Synthroid 75 mcg daily.  3. Lexapro 10 mg q.h.s.  4. Zetia 10 mg p.o. daily.  5. Actos 30 mg p.o. daily.  6. Wellbutrin XL 150 mg p.o. daily.  7. Toprol XL 50 mg p.o.  q.h.s.  8. Isosorbide dinitrate 30 mg p.o. daily.  9. Nexium 40 mg p.o. daily.  10.      Clorazepate 15 mg t.i.d.  11.      Lipitor 40 mg p.o. daily.  12.      Advair 10/500 daily.  13.      Micardis hydrochlorothiazide 80/12.5 p.o. daily.   ALLERGIES:  None.   PAST SURGICAL HISTORY:  Arthroscopic to the right knee x2.   FAMILY HISTORY:  Noncontributory.   SOCIAL HISTORY:  She lives with her brother.  No history of alcohol abuse.  Nonsmoker.   REVIEW OF SYSTEMS:  Wheezing, coughing, weakness right leg, numbness,  depression, anxiety, poor sleep at times.  She has reflux.  She has diarrhea  more than constipation with her irritable bowel with abdominal pain, poor  appetite.   PHYSICAL EXAMINATION:  VITAL SIGNS:  Blood pressure 153/79, pulse 66,  respirations 14, O2 saturation 95% on room air.  Exam was chaperoned per  Irena Reichmann, R. N.  NEUROLOGICAL:  Gait is antalgic, favoring the right side, uses a cane short  distances.  Needs assistance getting on and off the exam table and a step.  Affect is  alert.  Appearance is normal.  Neck has full range of motion.  Back has good forward flexion, extension is to 50%, lateral rotation is 50%.  Motor strength is 4/5 bilateral, deltoid, biceps and triceps, grip as well  as hip flexion, knee extension, ankle dorsiflexion.  She has 0-120 degree  knee range of motion.  She has good hip range of motion bilaterally,  although, she does have pain with internal and external rotation of the  hips.  Favor test shows no evidence of pain within the SI area.  Deep tendon  reflexes are normal.  She has tenderness to palpation of the right knee.  No  evidence of effusion or erythema.   IMPRESSION:  1. Right knee osteoarthritis with lack of cardiac clearance for total knee     replacement.  2. Lumbar pain, sounds like lumbar stenosis.  I do not have any exams     available to me.  She did have MRI's done at [facility], and I     will  ask her to get that for me.  3. Balance disorder, multifactorial.  4. Irritable bowel, tends to have diarrhea.  5. Depression.   PLAN:  1. Will have home health, physical therapy, occupational going to her home     three times a week.  Work on [employer].  Look at equipment and work on     balance and endurance.  2. Hydrocodone 5/500 one p.o. t.i.d.  3. Lidoderm patch to the right knee, on 12 hours, off 12 hours.  4. I will see her back in four weeks.     Erick Colace, M.D.   AEK/MedQ  D:  11/26/2003 10:42:47  T:  11/26/2003 12:34:19  Job #:  [id number]   cc:   Madlyn Frankel Charlann Boxer, M.D.  [facility]  [address]  [address]  [place]  [place] [address]  Fax: [phone]

## 2011-01-05 NOTE — Op Note (Signed)
[name], [name]                           ACCOUNT NO.:  [id number]   MEDICAL RECORD NO.:  [id number]                   PATIENT TYPE:  INP   LOCATION:  0474                                 FACILITY:  [facility]   PHYSICIAN:  Madlyn Frankel. Charlann Boxer, M.D.               DATE OF BIRTH:  Jul 14, 1925   DATE OF PROCEDURE:  03/07/2004  DATE OF DISCHARGE:                                 OPERATIVE REPORT   PREOPERATIVE DIAGNOSIS:  End-stage right knee osteoarthritis.   POSTOPERATIVE DIAGNOSIS:  End-stage right knee osteoarthritis.   PROCEDURE:  Right total knee replacement.   COMPONENTS USED:  DePuy Sigma Knee System with a size 3 femur, a 2.5 mm  __________ cobalt chromium tray with a 10 mm posterior stabilizing insert  and a 35 patella.   SURGEON:  Madlyn Frankel. Charlann Boxer, M.D.   ASSISTANTDruscilla Brownie. Cherlynn June.   ANESTHESIA:  Spinal plus MAC.   ESTIMATED BLOOD LOSS:  50 cc.   TOURNIQUET TIME:  61 minutes at 300 mmHg.   DRAINS:  One.   COMPLICATIONS:  None apparent.   INDICATIONS FOR PROCEDURE:  [name] is a pleasant 77 year old female who  has had persistent and longstanding problems with the right knee over the  past two years.  She has been cleared for surgical intervention for right  total knee replacement last year by her medical physician, Dr. Debria Garret; however, she delayed her surgery slightly and became a patient of  mine.  There was still some anxiety concerning whether or not she had had  clearance but after working this out with her physician, she was cleared for  surgery.  She had significant end-stage bone-on-bone arthritis in the medial  component of the right knee, which caused her persistent pain despite  conservative measures.  She failed all conservative measures.  Based on  this, we discussed the risks and benefits of knee replacement, which  included the failure of the components, DVT, bleeding, as well as those  associated with her comorbidities.  After  reviewing these, she consented for  the procedure.   PROCEDURE IN DETAIL:  Patient was brought to the operating theater.  Once  adequate anesthesia and preoperative antibiotics had been administered  following 1 gm of Ancef, the patient was positioned supine.  A proximal  thigh tourniquet was placed.  The right lower extremity was then prepped and  draped in a sterile fashion.  A midline incision was then made followed by  medial parapatellar arthrotomy.  The knee was exposed without patellar  eversion.  Following knee exposure, the intramedullary guide was placed into  the femur, and 11 mm of band was dissected off of the distal femur at 5  degrees of valgus.  The patient had a 5-10 degree flexion contracture.  After this, the sizing jig determined the size 3 femur would fit best  without notching anteriorly along the  anterior reference.  Following this,  the anterior, posterior, and chamfer cuts were made for a size 3 femur.  A  box cut was made off the lateral aspect of the medial femoral condyle.  At  this point, attention was directed to the tibia.  Tibial exposure was  obtained.  A 10 mm resection was made off of the lateral side of the tibia.  Adequate bone appeared to be removed with the knee in flexion and extension.  The cut surface of the tibia appeared to be best fitted with a size 2.5  tibia.  Trial reduction was carried out.  The knee came to full extension  and was stable with both flexion and extension with a size 10 insert.  At  this point, attention was directed to the patella.  The patella was everted  to allow for cutting for the cut surface of the patella.  The precut  measurement was approximately 23.  After cutting the patella, it was down to  14 mm.  We sized a size 35 patellar button and drilled holes in the superior  medial aspect of the patella.  The patella tracked without problems or  pressure.  At this point, the tibial rotation was __________.  Final   preparation of the tibia was carried out with drilling and keel punch.  Final components were brought into the field, as noted.  The knee was  copiously irrigated with a pulse lavage, normal saline solution, and dried.  Once the cement was mixed and ready, the tibial component was cemented into  position, followed by the femur with placement of a cruciate-containing  exposure to allow for the knee to be brought into extension while the cement  cured and the patella was cemented into position.  Excessive cement removed.  Once the cement had cured, excessive cement was removed and debrided from  the joints.  The knee was irrigated again, and a final 10 mm posterior  stabilized 2.5 mm polyethelene liner was impacted onto the tray.  The  tourniquet was let down following irrigation.  Hemostasis was obtained other  than that from the cut surface of the bone.  The extensor mechanism was  reapproximated using a #1 PDS.  The remainder of the wound was closed in  several layers, as she had 3 cm of subcutaneous fat present around the knee.  The skin edges were reapproximated with a 4-0 Monocryl.  The skin was then  cleaned and dried and dressed sterilely with Steri-Strips, dressings,  sponges, and tape.  The patient tolerated the procedure without  complications and was transferred to the recovery room.                                               Madlyn Frankel Charlann Boxer, M.D.    MDO/MEDQ  D:  03/07/2004  T:  03/07/2004  Job:  [id number]

## 2011-01-05 NOTE — H&P (Signed)
Annette, [name]                 ACCOUNT NO.:  [id number]   MEDICAL RECORD NO.:  [id number]          PATIENT TYPE:  INP   LOCATION:  NA                           FACILITY:  [facility]   PHYSICIAN:  Madlyn Frankel. Charlann Boxer, M.D.  DATE OF BIRTH:  October 05, 1924   DATE OF ADMISSION:  10/23/2004  DATE OF DISCHARGE:                                HISTORY & PHYSICAL   CHIEF COMPLAINT:  Right knee arthrofibrosis associated with infrapatellar  fat pad scarring in the form of patella clunk.   HISTORY OF PRESENT ILLNESS:  [name] is a 77 year old female, who is now 8  months out from a right total knee replacement.  She had done quite well  until recently when she noted a progressive popping-type sensation which was  mechanical in nature, primarily in the anterior aspect of her knee.  It  happened with terminal extension from a flexed position.  Each time it would  do this, it would aggravate her knee and cause problems.  She has had no  problems with fevers, chills, night sweats, had progressed nicely until this  began bothering her.  I had reviewed with her at one of her most recent  visits the possibility of this being a persistent problem and in follow up,  it has been.  She has had some pulmonary complications that have limited her  function; however, she has made progressive nonetheless with her total knee  replacement.  She is now scheduled as per our discussion to have a right  knee arthroscopic debridement and removal of scar tissue including this fat  pad formation.   PAST MEDICAL HISTORY:  1.  Hypertension.  2.  Coronary artery disease, status post cardiac catheterization.  3.  History of chronic obstructive pulmonary disease, asthma, bronchitis.  4.  Anxiety.  5.  Acid reflux.  6.  Diabetes.  7.  Hypothyroidism.   PAST SURGICAL HISTORY:  Right total knee replacement performed July 2005.   FAMILY MEDICAL HISTORY:  Coronary disease, hypertension, diabetes, stroke,  and arthritis.   MEDICATIONS:  1.  Micardis HCT 80 mg daily.  2.  Zetia.  3.  Actos 30 mg daily.  4.  Wellbutrin.  5.  Singulair.  6.  Toprol.  7.  Lexapro.  8.  Lipitor.  9.  Tranxene.  10. Synthroid.  11. Albuterol.   DRUG ALLERGIES:  No known drug allergies.   REVIEW OF SYSTEMS:  She has been healthy with regard to her upper  respiratory and pulmonary function over the past 2 weeks.  She has had no  lower GI or upper GI symptoms.  No diarrhea.  No problem with her  genitourinary system.  No chest pains.   PHYSICAL EXAMINATION:  VITAL SIGNS:  Examination reveals a temperature of  98.8, pulse 80, respirations 24, blood pressure 130/82.  GENERAL:  She overall is awake, alert, oriented, and cooperative in normal  attire, in no acute distress.  HEAD AND NECK:  Normal facial nerve function.  Her neck is supple to  palpation with no nodules palpated.  She has negative bruits to  auscultation.  CHEST:  Clear to auscultation.  No inspiratory wheezing.  HEART:  Regular rate and rhythm with no murmur detected.  ABDOMEN:  Soft with positive bowel sounds.  EXTREMITIES AND SKIN:  A well-healed surgical incision on the right knee.  She does have a mechanical popping of the knee when it comes from flexed to  terminal extension position.  This produces discomfort.  Ligaments are  otherwise stable.  Patella is stable.  She has otherwise palpable pulses,  intact sensibility in the right lower extremity.  No significant swelling or  erythema or concern for infection in the right leg.   RADIOGRAPHS:  Right knee films had been ordered which revealed well  positioned and fixed right total knee components with no evidence of  complication of the patella or extensor mechanism.   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   PLAN:  As reviewed with Ms. Lafrance, this is a phenomenon that occurs not  completely commonly but does occasional happen with a posterior stabilized   total knee replacement.  Arthroscopic debridement is a very adequate way to  remove this tissue as opposed to an open debridement.  For this reason,  risks and benefits were reviewed; consent will be obtained.  Surgery is  scheduled for March 13.      MDO/MEDQ  D:  10/23/2004  T:  10/23/2004  Job:  [id number]

## 2011-01-05 NOTE — Op Note (Signed)
NAMECICLEY, GANESH                 ACCOUNT NO.:  [id number]   MEDICAL RECORD NO.:  [id number]          PATIENT TYPE:  INP   LOCATION:  1516                         FACILITY:  [facility]   PHYSICIAN:  Madlyn Frankel. Charlann Boxer, M.D.  DATE OF BIRTH:  1925-05-30   DATE OF PROCEDURE:  11/22/2005  DATE OF DISCHARGE:                                 OPERATIVE REPORT   PREOPERATIVE DIAGNOSIS:  Patellar clunk, status post right total knee  replacement, with a diagnosis of abundant scar in the suprapatellar pouch.   POSTOPERATIVE DIAGNOSIS:  Patellar clunk, status post right total knee  replacement with a diagnosis of abundant scar in the suprapatellar pouch.   PROCEDURE:  1.  Incision and drainage of right knee with excision of scar and complete      synovectomy.  2.  Polyethylene exchange with DePuy Sigma fixed bearing, size 2.5 x 10 mm      posterior stabilized insert.   SURGEON:  Madlyn Frankel. Charlann Boxer, M.D.   ASSISTANT:  Georges Lynch. Darrelyn Hillock, M.D.   ANESTHESIA:  Spinal plus MAC.   SPECIMENS:  None.   BLOOD LOSS:  Minimal.   TOURNIQUET TIME:  65 minutes at 250 mmHg.   COMPLICATIONS:  None.   DRAINS:  Times one.   INDICATIONS FOR PROCEDURE:  Ms. Dunnigan is a 77 year old female who is status  post total knee replacement approximately two years out.  She had initially  done well but had begun having symptoms of a snapping-type sensation with  knee flexion.  This was palpable on passive examination in the office.  She  had undergone a knee arthroscopy procedure for debridement of scar which  initially provided relief;however, symptoms recurred.  After discussing with  her over the past several months her current situation, we opted for  operative exploration and excision of scar.  Risks and benefits were  discussed including infection, DVT, component failure, or the persistence of  recurrence of these symptoms.  Consent was obtained.   PROCEDURE DETAILS:  The patient was brought to the operative  theater.  Once  adequate anesthesia and preoperative antibiotics were administered, 1 gm of  Ancef, the patient was positioned supine with the proximal thigh tourniquet  placed.  The right lower extremity was then prepped and draped in a sterile  fashion.  The patient's previous incision was incised.  Sharp dissection was  carried down to the extensor mechanism.  A medial parapatellar arthrotomy  was created.   Initially, upon making the medial patellar arthrotomy, there was a portion  of synovium and scar that pooched out through the wound, which I thought was  somewhat unusual.  But, once I opened up the knee, there did not appear to  be an exuberant amount of scar.  Knee exposure was obtained routinely.  I  did free up and debride the lateral medial gutter, performed synovectomies  in both.  In the suprapatellar pouch, again, there was not an excessive  amount of scar, particularly over this proximal pole of the patella.  Nonetheless, an extensive synovectomy was carried out in the suprapatellar  pouch  back to the normal fat pad.   The knee was flexed.  The old polyethylene insert was removed.  The  posterior aspect of the knee was debrided.  Once this was carried out, the  wound was irrigated with 2 L of normal saline solution with pulse lavage.  Once I was content that the scar excision was satisfactory, I placed a final  size 10 mm posterior stabilizer insert, size 2.5, for this fixed bearing  knee.   Note that the knee came out to near-full extension.   No lateral release was carried out, but the patella tracking was noted to be  excellent without any application of pressure.   At this point, a medium Hemovac drain was placed deep.  The extensor  mechanism was reapproximated using #1 PDS with the knee in flexion.  The  remainder of the wound was closed in layers with a 4-0 Monocryl on the skin.  The skin was dried.  Steri-Strips were applied.  The tourniquet was let down   after wound closure, and the wound was dressed in a sterile bulky Jones  dressing.      Madlyn Frankel Charlann Boxer, M.D.  Electronically Signed     MDO/MEDQ  D:  11/22/2005  T:  11/23/2005  Job:  [id number]

## 2011-01-05 NOTE — Consult Note (Signed)
[name], [name]                           ACCOUNT NO.:  [id number]   MEDICAL RECORD NO.:  [id number]                   PATIENT TYPE:  IPS   LOCATION:  4001                                 FACILITY:  [facility]   PHYSICIAN:  Clinton D. Maple Hudson, M.D.              DATE OF BIRTH:  December 15, 1924   DATE OF CONSULTATION:  03/13/2004  DATE OF DISCHARGE:                                   CONSULTATION   PROBLEM:  A 77 year old white female, nonsmoker, seen in pulmonary  consultation at the request of Dr. Riley Kill for wheezing and dyspnea. She is  postoperative right knee surgery which she reports as uncomplicated with  spinal anesthesia. She has been wheezing somewhat more than usual since  surgery.   REVIEW OF SYSTEMS:  She feels she has a little loose phlegm, but cannot  cough anything up. Denies chest pain or palpitations. He is mildly dyspneic.  Bronchodilator nebulizer treatments help only briefly. She denies nausea or  vomiting, fever, or sweats.   PAST MEDICAL HISTORY:  Chronic asthmatic bronchitis with suspected  components also of vocal cord dysfunction and GERD. Essential hypertension.  Diabetes mellitus, type 2, non-insulin dependent. Report of an unremarkable  cardiac stress test earlier this year with details not available.   SOCIAL HISTORY:  Widow. Assisted by son and granddaughter.   HOME MEDICATIONS:  1. Synthroid 75 mcg.  2. Albuterol/Advair now at 250/50.  3. Astelin nasal spray.  4. Chlorazepate 15 mg daily.  5. Prilosec 20 mg.  6. Mucinex.  7. Phenobarbital.  8. Singulair 10 mg.  9. Lexapro 10 mg.  10.      Actos 3 mg.  11.      Wellbutrin XL 150 mg.  12.      Micardis/HCTZ 80/12.5 mg.   FAMILY HISTORY:  Coronary artery disease, cerebrovascular disease.   RECENT LABORATORY:  In my office on February 15, 2004, chest x-ray showed CT  ratio of 14/26 with clear lung fields, normal heart size, no acute process,  and no recognized change from August 2004.  Pulmonary function  testing  reported an FVC of 1500 (63%), FEV1 of 1000 (56%), and ratio of 0.67.  There  was insignificant response to bronchodilator. Interpretation was moderate  restriction and obstruction. Some of the restrictive component was felt to  reflect obesity with hypoventilation.   OBJECTIVE:  VITAL SIGNS: BP 125/67, respirations 20, temperature 97.7, room  air saturation now 91%.  GENERAL: Obese, passive, calm woman lying supine, unlabored on room air and  worried that her hair does not look right.  SKIN: No rash.  ADENOPATHY: None found.  HEENT: Oral mucosa clear. No stridor. Some slight inspiratory and expiratory  high-pitched wheeze type noise at the larynx was distractible and  interpreted as possible VCD. There seems to be a lipoma on her neck.  CHEST: Mild bilateral upper zone wheeze posteriorly. No dullness. Breath  sounds are  shallow.  HEART: Regular rhythm without murmur or gallop.  ABDOMEN: Obese and soft.  EXTREMITIES: Postoperative right knee. No cyanosis or clubbing.   IMPRESSION:  1. Chronic asthmatic bronchitis which may be near her baseline function now.  2. Obesity with hyperventilation contributing to hypoxia, which is mild,     with room air saturation of 91%.  3. Her current medications are appropriate. I would rather not aggravate her     diabetes by loading with systemic steroids if it can be avoided and we     may have to accept a trial of Solu-Medrol with sliding scale coverage.  4. As soon as possible she needs to spend more time up and out of bed. Her     lungs will inflate better when she is upright.  5. When her knee is ready, she can probably go home like this to her home     medication and outpatient follow-up.  6. To clarify status, I have ordered an ABG and portable chest x-ray.                                               Clinton D. Maple Hudson, M.D.    CDY/MEDQ  D:  03/13/2004  T:  03/13/2004  Job:  [id number]   cc:   Madlyn Frankel Charlann Boxer, M.D.  Signature  Place Office  [address]  [address]  [place]  [place] [address]  Fax: [phone]   Thereasa Solo. Little, M.D.  [address]  [address]  [place]  [place] [address]  Fax: [phone]   Veverly Fells. Altheimer, M.D.  [address], [address]  [place]  [place] [address]  Fax: [phone]

## 2011-01-05 NOTE — Procedures (Signed)
[facility]  Patient:    [name], [name]                        MRN: [id number] Proc. Date: 03/20/00 Adm. Date:  [id number] Attending:  Rich Brave CC:         Wayne A. Sheffield Slider, M.D.  Thereasa Solo. Little, M.D.  Clinton D. Maple Hudson, M.D.   Procedure Report  PROCEDURE:  Upper endoscopy.  INDICATIONS FOR PROCEDURE:  A 77 year old with nonspecific reflux type symptoms.  FINDINGS:  Essentially normal exam.  DESCRIPTION OF PROCEDURE:  The nature, purpose and risk of the procedure have been discussed with the patient who provided written consent. Sedation was fentanyl 100 mcg and Versed 7 mg IV without arrhythmias or desaturation. The Olympus video endoscope was passed under direct vision. The vocal cords appeared slightly atrophic and the larynx perhaps slightly edematous, perhaps from reflux. I did not see any vocal cord nodularity. The esophagus was fairly easily entered and had normal mucosa, without evidence of reflux esophagitis, Barretts esophagus, varices, infection or neoplasia. No ring, stricture or hiatal hernia was appreciated. The stomach was entered. It contained no significant residual and had normal mucosa except in the antrum where there were a couple of linear areas of erythema, as might be seen with aspirin or NSAID exposure (the patient does take a daily enteric coated aspirin tablet). The pylorus, duodenal bulb and second duodenum looked normal. Retroflexed view into the proximal stomach showed no evidence of a hiatal hernia or other abnormality. The scope was then removed from the patient. No biopsies were obtained. The patient tolerated the procedure well and there were no apparent complications.  IMPRESSION:  Essentially normal upper endoscopy. Possible subtle changes of inflammation in the laryngeal region. No reflux-induced esophageal mucosal abnormalities.  PLAN:  Medical therapy as needed to control reflux symptoms. At the  moment, her symptoms are rather nonspecific but are quite well controlled by the use of Prilosec. She does have occasional breakthrough symptoms. DD:  03/20/00 TD:  03/21/00 Job: [id number]

## 2011-01-05 NOTE — H&P (Signed)
[name], [name]                 ACCOUNT NO.:  [id number]   MEDICAL RECORD NO.:  [id number]          PATIENT TYPE:   LOCATION:                                 FACILITY:   PHYSICIAN:  Annette Frankel. Charlann [name], M.D.  DATE OF BIRTH:  08/15/1925   DATE OF ADMISSION:  12/21/2005  DATE OF DISCHARGE:                                HISTORY & PHYSICAL   CHIEF COMPLAINT:  Pain in my right knee.   PRESENT ILLNESS:  This 77 year old lady is status post total knee  replacement arthroplasty to the right knee. She has a limited result  secondary to intraarticular scarring and arthrofibrosis. She has had  arthroscopic surgery after her total knee to remove a considerable amount of  synovium but due to the mechanics of the knee versus the arthroscope, we  were unable to remove the scar tissue. She has developed a patella clunk and  now will require open arthrotomy of the total knee to remove the scar tissue  so that the patella can track correctly. Due to the fact that she has  persistent discomfort and catching in the suprapatellar pouch and the fact  that this lady is really quite an active woman but this is markedly  interfering with her day-to-day activities, and after much discussion  including the risks and benefits of surgery, it was decided to go ahead with  open I&D with excision of scar over the right knee, possible liner exchange.   PAST MEDICAL HISTORY:  This lady has been in relatively good health. She has  been treated by Dr. Leslie Dales for her internal medicine problems and Dr.  Clarene Duke is her cardiologist. She has been cleared preoperatively for this  surgical procedure by Dr. Julieanne Manson and a Cardiolite study in January  2007 showed a 76% ejection fraction and negative for ischemia. She has no  known drug allergies.   CURRENT MEDICATIONS:  1.  Singulair 10 mg daily.  2.  Albuterol 90 mcg inhaler.  3.  Lipitor 40 mg daily.  4.  Lexapro 10 mg daily.  5.  Toprol-XL 50 mg daily.  6.   Aspirin 81 mg daily (will stop prior to surgery).  7.  Zetia 10 mg daily.  8.  Actos 30 mg daily.  9.  Wellbutrin XL 150 mg daily.  10. Synthroid 75 mcg daily.  11. Micardis HCT 80/12.5 mg daily.  12. Advair inhaler p.r.n.  13. Prilosec OTC p.r.n.  14. Tranxene 15 mg  b.i.d. p.r.n.  15. DeVilbiss nebulizer p.r.n.   The patient has asthma with intermittent bronchitis, pneumonia 3 years ago.  She has hypertension, reflux, non-insulin-dependent diabetes mellitus,  hypothyroidism.   SURGERIES:  Include two breast surgeries, some kind of bladder surgery that  sounds like a tack-up, hysterectomy, lens implant left eye after cataract  removal and then subsequent laser iridotomy for glaucoma crisis.   FAMILY HISTORY:  Positive for heart disease, hypertension, diabetes, and  stroke.   SOCIAL HISTORY:  The patient is widowed, retired, has no intake of alcohol  or tobacco products. Has one son who will be the  major caregiver after  surgery.   REVIEW OF SYSTEMS:  CNS:  No seizure disorder, paralysis, numbness, double  vision. RESPIRATORY:  No productive cough, no hemoptysis, rare shortness of  breath and is well controlled with inhalers. CARDIOVASCULAR:  No chest pain,  no angina or orthopnea. GASTROINTESTINAL:  No nausea, vomiting, melena, or  bloody stools. Occasional constipation. GENITOURINARY:  No discharge,  dysuria. MUSCULOSKELETAL:  Primarily in present illness.   PHYSICAL EXAMINATION:  GENERAL:  Alert and cooperative, friendly, somewhat  apprehensive 77 year old white female who is accompanied by her son.  VITAL SIGNS:  Blood pressure 130/78 seated, pulse 82, respirations are 12.  HEENT:  Normocephalic, PERRLA, EOM intact, oropharynx is clear.  CHEST:  Clear to auscultation, no rhonchi, no rales.  HEART:  Regular rate and rhythm, no murmurs are heard.  ABDOMEN:  Soft, nontender. Liver and spleen not felt.  GENITALIA, RECTAL, PELVIC, BREAST:  Not done, not pertinent to present   illness.  EXTREMITIES:  The patient has pretty good range of motion of the right knee.  However, on extension she has catching of patella with pain into the patella  pouch.   ADMITTING DIAGNOSES:  1.  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  2.  Asthma.  3.  Hypothyroidism.  4.  Non-insulin-dependent diabetes mellitus.  5.  Hypertension.  6.  Coronary artery disease.   PLAN:  The patient will undergo open I&D and excision of scar of the right  knee, possible exchange of liner. Should we have any cardiac problems we  will certainly call Dr. Clarene Duke, and any medical problems we will notify Dr.  Leslie Dales.      [name].      [name], M.D.  Electronically Signed    DLU/MEDQ  D:  11/01/2005  T:  11/01/2005  Job:  [id number]   cc:   Thereasa Solo. Little, M.D.  Fax: [phone]   Veverly Fells. Altheimer, M.D.  Fax: [phone]

## 2011-01-05 NOTE — Op Note (Signed)
[name], [name]                 ACCOUNT NO.:  [id number]   MEDICAL RECORD NO.:  [id number]          PATIENT TYPE:  INP   LOCATION:  0007                         FACILITY:  [facility]   PHYSICIAN:  Madlyn Frankel. Charlann Boxer, M.D.  DATE OF BIRTH:  28-Oct-1924   DATE OF PROCEDURE:  10/30/2004  DATE OF DISCHARGE:                                 OPERATIVE REPORT   PREOPERATIVE DIAGNOSIS:  Patellar clunking, status post right total knee  arthroplasty, or arthrofibrosis with abundant scar tissue, right total knee  arthroplasty.   POSTOPERATIVE DIAGNOSIS/FINDINGS:  There was an abundant amount of scar  tissue within the anterior, anterior medial, anterolateral, anterior  patellar pouches of this patient's knee.  Please note that she had fairly  good range of motion after her knee replacement but had significant  mechanical clunking with knee flexion and extension.  Findings were  consistent with her complaints.   PROCEDURES:  Right knee diagnostic and operative arthroscopy with an  extensive tricompartmental synovectomy and debridement of scar tissue,  reestablishing suprapatellar pouch, medial and lateral gutters and anterior  aspect of knee.   SURGEON:  Madlyn Frankel. Charlann Boxer, M.D.   ASSISTANT:  None.   ANESTHESIA:  Spinal plus MAC.   SPECIMENS:  None.   BLOOD LOSS:  None.   COMPLICATIONS:  None.   INDICATIONS FOR PROCEDURE:  [name] is a very pleasant 77 year old female  who is status post a right total knee replacement, roughly in July, 2005.  She had done quite well in her therapy but had presented recently with  concerns for mechanical symptoms in her knee with knee flexion and  extension.  Based on the exam findings of this, this very much had the sense  of a potential scarring of a patellar clunk that can happen after a  posterior cruciate sacrificing total knee replacement.  After reviewing with  her these findings and concerns, she was consented for operative procedure.  Risks included  recurrence of scar tissue and loss of motion and potential  infection, status post total knee replacement.  After reviewing risks and  benefits, consent was obtained for diagnostic and operative arthroscopy  versus open procedure.   PROCEDURE IN DETAIL:  Patient was brought to the operating theater.  Once  adequate anesthesia and preoperative antibiotics of 1 gm of Ancef were  administered, the patient was positioned supine.  The right leg was placed  in a leg holder.  The right lower extremity was then prepped and draped in a  sterile fashion.  Standard inferior lateral, superior lateral, and inferior  medial portals were utilized.  These were previous portal sites that the  patient already had from a previous arthroscopy.  Upon entry into the knee  through the inferior lateral compartment, in the inferior aspect of the  knee, there was noted to be an abundant amount of scar tissue.  Once  orientation was established with volume into the knee, the inferior medial  portal was entered, and a 3.5 shaver introduced.  They used a 3.5 shaver for  a while but then converted over to a  4.2 mm shaver in order to maximize  suction and debridement capacity.  It was clearly evident at this point  there was an abundant amount of scar tissue over the anteromedial and  anterolateral aspect of the knee.  The scar tissue was directly anterior  within the notch.  The 4.2 shaver was utilized and debrided.  We established  the medial and lateral gutters.  Following this debridement, I was able to  get into the suprapatellar pouch and visualized some of the debridement that  was required over there, mainly in the superolateral aspect.  A 4.2 shaver  was utilized to debride this.  I checked the knee several different times to  make sure these gutters were established.  There was noted to be a  significant amount of scar tissue within the notch but also impinging within  the tibiofemoral joint itself.  Following  and at various times during the  case, I would remove the instruments to do range of motion, and I felt that  the range of motion was much more pure without any large clunking sensation.  Following this extensive debridement, instrumentation was removed.  The  portals were reapproximated using 4-0 nylon.  The knee was injected with  0.25% Marcaine with epinephrine, in an effort mainly to establish some  hemostasis.  The knee was then dressed into a sterile, bulky Jones dressing  and transferred to the recovery room in stable condition.   We will have the patient admitted for 23-48 hours observation and begin  range of motion.  She will continue working on therapy and home exercises  and return to see me in two weeks, at which point she will establish  outpatient physical therapy.  May establish home health physical therapy for  a two-week period of time in an effort to maximize her range of motion  potential in this period of time.      MDO/MEDQ  D:  10/30/2004  T:  10/30/2004  Job:  [id number]

## 2011-01-05 NOTE — Discharge Summary (Signed)
[name], [name]                 ACCOUNT NO.:  [id number]   MEDICAL RECORD NO.:  [id number]          PATIENT TYPE:  INP   LOCATION:  1516                         FACILITY:  [facility]   PHYSICIAN:  Madlyn Frankel. Charlann Boxer, M.D.  DATE OF BIRTH:  11-28-1924   DATE OF ADMISSION:  11/22/2005  DATE OF DISCHARGE:  11/26/2005                                 DISCHARGE SUMMARY   ADMISSION DIAGNOSES:  1.  Patella Club syndrome status post right total knee replacement      arthroplasty.  2.  Asthma.  3.  Hypothyroidism.  4.  Non-insulin-dependent diabetes mellitus.  5.  Hypertension.  6.  Coronary artery disease.   DISCHARGE DIAGNOSES:  1.  Patella Club syndrome status post right total knee replacement      arthroplasty with diagnosis of abundant scar in the suprapatellar pouch.  2.  Asthma.  3.  Hypothyroidism.  4.  Non-insulin-dependent diabetes mellitus.  5.  Hypertension.  6.  Coronary artery disease.  7.  Mild postoperative anemia.   OPERATION:  On November 22, 2005, the patient underwent one incision and  drainage of right knee with excision of scar and complete synovectomy.  Polyethelene exchange with diffuse sigma fixed bearing.  Posterior  stabilizing insert.  Ronald A. Gioffre assisted.   BRIEF HISTORY:  This 77 year old lady who underwent total knee replacement  arthroplasty two years ago had been doing well until recently when she had a  snapping feeling in the knee primarily with flexion.  Knee arthroscopy was  done prior to the above surgery for debridement of scar which provided her  with short term relief.  However, the symptoms reoccurred.  After much  discussion, including the risks and benefits of surgery with control in the  patient, opted for operative expiration with excision of the scar.   HOSPITAL COURSE:  The patient tolerated the surgical procedure quite well.  She was very familiar with the total knee protocol, and she worked with  physical therapy, ambulating at least  70 feet in the hall.  Exterior  training was underway prior to discharge, and once this was done she could  be discharged home.  Home Health was arranged with the patient with physical  therapy.  She had much less pain and discomfort with flexion of the knee.  Wound was clean and dry and neurovascularly intact to the operative  extremity, and she was stable.   LABORATORY DATA:  Hematologically, CBC preoperatively completely within  normal limits.  Hemoglobin was 12.3, hematocrit 37.9, final hemoglobin 10.3,  hematocrit 31.3.  Blood chemistries were normal.  Urinalysis was negative  for urinary tract infection.  Chest x-ray showed basilar atelectasis.  No  electrocardiogram seen.   CONDITION ON DISCHARGE:  Stable.   PLAN:  The patient was discharged to her home in the care of her family.  She was to continue with home medications and follow with her family  physician as indicated.  We wrote prescriptions for Vicodin for pain,  Robaxin as a muscle relaxant, one aspirin a day x4 weeks and prednisone two  days as directed.  Dooley L. Cherlynn June.      Madlyn Frankel Charlann Boxer, M.D.  Electronically Signed    DLU/MEDQ  D:  12/06/2005  T:  12/07/2005  Job:  [id number]   cc:   Thereasa Solo. Little, M.D.  Fax: [phone]   Veverly Fells. Altheimer, M.D.  Fax: [phone]

## 2011-01-05 NOTE — Discharge Summary (Signed)
[name], [name]                           ACCOUNT NO.:  [id number]   MEDICAL RECORD NO.:  [id number]                   PATIENT TYPE:  IPS   LOCATION:  4001                                 FACILITY:  [facility]   PHYSICIAN:  Ranelle Oyster, M.D.             DATE OF BIRTH:  [birth year]   DATE OF ADMISSION:  03/10/2004  DATE OF DISCHARGE:  03/16/2004                                 DISCHARGE SUMMARY   DISCHARGE DIAGNOSES:  1. Right total knee replacement.  2. Asthma.  3. Diabetes mellitus.  4. History of anxiety and depression.   HISTORY OF PRESENT ILLNESS:  Mr. [name] is a 77 year old female with history  of hypertension, diabetes mellitus, end-stage OA right knee.  She elected to  undergo right total knee replacement on July 15 by Dr. Charlann Boxer.  Postop is  weightbearing as tolerated and Arixtra for DVT prophylaxis.  Therapies  initiated and this patient progressing along slowly.  ___________ was  consulted for ambulation and ADL needs.   PAST MEDICAL HISTORY:  1. GI bleed.  2. Dyslipidemia.  3. Hypertension.  4. DM type 2.  5. Hypothyroidism.  6. Asthma.  7. Depression.  8. DOE.  9. Frequency.  10.      IBS.  11.      Appendectomy, hysterectomy, bladder tack.  12.      History of angioplasty.   SOCIAL HISTORY:  The patient is a widow, lives in one-level home with two  steps at entry.  Does not use any tobacco or alcohol.   HOSPITAL COURSE:  Ms. [name] was admitted to rehab on March 10, 2004,  for inpatient therapies to consist of PT/OT daily.  Past admission she was  maintained on Coumadin for DVT prophylaxis.  The patient was noted to have  superficial edema of the right knee; however, the incision was intact and  has healed well without any signs or symptoms of infection.  Of issue during  this stay have been problems with increasing dyspnea requiring nebulizer use  and some O2 x24-48 hours past admission.  Dr. Leslie Dales, who has been  following the patient along,  recommended pulmonary input as well as question  of need of short burst of steroids for treatment.  Dr. Maple Hudson, who evaluated  the patient, felt that the patient's chronic asthmatic bronchitis may be  baseline function and that obesity with hypoventilation was contributing to  her hypoxia.  He felt that she needed to be mobilized to help with  evaluation and wanted to hold off on steroids unless absolutely necessary.  The patient's respiratory status has improved.  Dyspnea has resolved.  Wheezing had also resolved by time of discharge.  Other workup includes  follow-up of patient's anemia, showing hemoglobin of 8.9, hematocrit 24.1,  white count 5.2, platelets 297.  The patient continues on supplements past  discharge, iron supplement which is to continue past discharge.  Check  of  labs showed sodium 142, potassium 3.4, and chloride 106, CO2 26, BUN 24,  creatinine 0.9, glucose 140.  The patient was encouraged to push p.o.  fluids.  The patient's diabetes was monitored with a.c. and h.s. blood sugar  checks.  Blood sugars continued ranging from 110 to 140.  Dr. Leslie Dales has  been following for input, and no further adjustments recommended.   Therapy is initiated, and patient's mood has been stable.  She has been  motivated and has progressed along well during her therapies.  At time of  discharge the patient was at modified independent level for upper body care,  modified independent with assistance for lower body care, modified  independent for toileting, requires supervision for kitchen safety, for  advanced ADLs.  She is independent for bed mobility, modified independent  for transfers, modified independent for ambulating 150 feet with a rolling  walker.  Requires supervision for navigating a wheelchair in a controlled  environment.  Knee flexion is currently at 0-95 degrees.  Further follow-up  therapies to include home health PT/OT by [facility] past  discharge.   Home health has been arranged to draw pro times with [facility]  pharmacy to follow Coumadin through August 15.  On March 16, 2004, the  patient is discharged to home.   DISCHARGE MEDICATIONS:  1. Wellbutrin XL 150 mg per day.  2. Coumadin 2 mg two p.o. q.p.m.  3. Lipitor 10 mg a day.  4. Imdur 30 mg a day.  5. Toprol XL 50 mg a day.  6. Lexapro 10 mg a day.  7. Singulair 10 mg a day.  8. Prilosec 30 mg a day.  9. Actos 30 mg a day.  10.      Trinsicon one p.o. b.i.d.  11.      Micardis/hydrochlorothiazide 80/12.5 mg one per day.  12.      Synthroid 25 mcg a day.  13.      Zetia 10 mg a day.  14.      Advair 25/50 mcg one puff b.i.d.  15.      Oxycodone IR 5-10 mg q.4-6h. p.r.n. pain.   ACTIVITY:  Walker.   DIET:  Low fat.   WOUND CARE:  Wash incision with soap and water.  Keep area clean and dry.   SPECIAL INSTRUCTIONS:  No alcohol, no smoking, no driving.  [facility] to provide PT/OT and R.N.   FOLLOW-UP:  Patient to follow up with Dr. Charlann Boxer and Dr. Leslie Dales in the next  few weeks.  Follow up with Dr. Riley Kill as needed.      Greg Cutter, P.A.                    Ranelle Oyster, M.D.    PP/MEDQ  D:  03/16/2004  T:  03/17/2004  Job:  [id number]   cc:   Veverly Fells. Al[place]imer, M.D.  [address], [address]  [place]  [place] [address]  Fax: [phone]   Madlyn Frankel. Charlann Boxer, M.D.  [facility]  [address]  [address]  [place]  [place] [address]  Fax: [phone]   Clinton D. Young, M.D.  [address] [place]  [place] [address]  Fax: [phone]

## 2011-08-28 DIAGNOSIS — E789 Disorder of lipoprotein metabolism, unspecified: Secondary | ICD-10-CM | POA: Diagnosis not present

## 2011-08-28 DIAGNOSIS — I2510 Atherosclerotic heart disease of native coronary artery without angina pectoris: Secondary | ICD-10-CM | POA: Diagnosis not present

## 2011-08-28 DIAGNOSIS — G459 Transient cerebral ischemic attack, unspecified: Secondary | ICD-10-CM | POA: Diagnosis not present

## 2011-08-28 DIAGNOSIS — E119 Type 2 diabetes mellitus without complications: Secondary | ICD-10-CM | POA: Diagnosis not present

## 2011-08-29 ENCOUNTER — Other Ambulatory Visit: Payer: Self-pay | Admitting: Internal Medicine

## 2011-08-29 DIAGNOSIS — R479 Unspecified speech disturbances: Secondary | ICD-10-CM

## 2011-09-04 ENCOUNTER — Ambulatory Visit
Admission: RE | Admit: 2011-09-04 | Discharge: 2011-09-04 | Disposition: A | Discharge status: Home / Self Care | Payer: PRIVATE HEALTH INSURANCE | Source: Ambulatory Visit | Attending: Internal Medicine | Admitting: Internal Medicine

## 2011-09-04 DIAGNOSIS — R479 Unspecified speech disturbances: Secondary | ICD-10-CM

## 2011-09-04 DIAGNOSIS — I6789 Other cerebrovascular disease: Secondary | ICD-10-CM | POA: Diagnosis not present

## 2011-09-04 DIAGNOSIS — E119 Type 2 diabetes mellitus without complications: Secondary | ICD-10-CM | POA: Diagnosis not present

## 2011-09-04 DIAGNOSIS — G459 Transient cerebral ischemic attack, unspecified: Secondary | ICD-10-CM | POA: Diagnosis not present

## 2011-09-04 DIAGNOSIS — R4789 Other speech disturbances: Secondary | ICD-10-CM | POA: Diagnosis not present

## 2011-09-11 ENCOUNTER — Other Ambulatory Visit: Payer: Self-pay | Admitting: Internal Medicine

## 2011-09-11 DIAGNOSIS — E041 Nontoxic single thyroid nodule: Secondary | ICD-10-CM

## 2011-09-18 ENCOUNTER — Ambulatory Visit
Admission: RE | Admit: 2011-09-18 | Discharge: 2011-09-18 | Disposition: A | Discharge status: Home / Self Care | Payer: Medicare Other | Source: Ambulatory Visit | Attending: Internal Medicine | Admitting: Internal Medicine

## 2011-09-18 DIAGNOSIS — E042 Nontoxic multinodular goiter: Secondary | ICD-10-CM | POA: Diagnosis not present

## 2011-09-18 DIAGNOSIS — E041 Nontoxic single thyroid nodule: Secondary | ICD-10-CM

## 2011-09-20 ENCOUNTER — Other Ambulatory Visit: Payer: Self-pay | Admitting: Internal Medicine

## 2011-09-20 DIAGNOSIS — E041 Nontoxic single thyroid nodule: Secondary | ICD-10-CM

## 2011-09-25 DIAGNOSIS — I10 Essential (primary) hypertension: Secondary | ICD-10-CM | POA: Diagnosis not present

## 2011-09-25 DIAGNOSIS — J019 Acute sinusitis, unspecified: Secondary | ICD-10-CM | POA: Diagnosis not present

## 2011-09-26 ENCOUNTER — Ambulatory Visit
Admission: RE | Admit: 2011-09-26 | Discharge: 2011-09-26 | Disposition: A | Discharge status: Home / Self Care | Payer: Medicare Other | Source: Ambulatory Visit | Attending: Internal Medicine | Admitting: Internal Medicine

## 2011-09-26 ENCOUNTER — Other Ambulatory Visit (HOSPITAL_COMMUNITY)
Admission: RE | Admit: 2011-09-26 | Discharge: 2011-09-26 | Disposition: A | Discharge status: Home / Self Care | Payer: Medicare Other | Source: Ambulatory Visit | Attending: Interventional Radiology | Admitting: Interventional Radiology

## 2011-09-26 DIAGNOSIS — E041 Nontoxic single thyroid nodule: Secondary | ICD-10-CM

## 2011-09-26 DIAGNOSIS — E049 Nontoxic goiter, unspecified: Secondary | ICD-10-CM | POA: Diagnosis not present

## 2011-10-08 DIAGNOSIS — R5381 Other malaise: Secondary | ICD-10-CM | POA: Diagnosis not present

## 2011-10-08 DIAGNOSIS — R5383 Other fatigue: Secondary | ICD-10-CM | POA: Diagnosis not present

## 2011-10-08 DIAGNOSIS — J0100 Acute maxillary sinusitis, unspecified: Secondary | ICD-10-CM | POA: Diagnosis not present

## 2011-10-08 DIAGNOSIS — R05 Cough: Secondary | ICD-10-CM | POA: Diagnosis not present

## 2011-10-08 DIAGNOSIS — J029 Acute pharyngitis, unspecified: Secondary | ICD-10-CM | POA: Diagnosis not present

## 2011-10-15 DIAGNOSIS — I10 Essential (primary) hypertension: Secondary | ICD-10-CM | POA: Diagnosis not present

## 2011-10-15 DIAGNOSIS — R197 Diarrhea, unspecified: Secondary | ICD-10-CM | POA: Diagnosis not present

## 2011-10-15 DIAGNOSIS — R232 Flushing: Secondary | ICD-10-CM | POA: Diagnosis not present

## 2011-10-23 DIAGNOSIS — R198 Other specified symptoms and signs involving the digestive system and abdomen: Secondary | ICD-10-CM | POA: Diagnosis not present

## 2011-10-23 DIAGNOSIS — K591 Functional diarrhea: Secondary | ICD-10-CM | POA: Diagnosis not present

## 2011-10-25 DIAGNOSIS — I739 Peripheral vascular disease, unspecified: Secondary | ICD-10-CM | POA: Diagnosis not present

## 2011-10-25 DIAGNOSIS — E1159 Type 2 diabetes mellitus with other circulatory complications: Secondary | ICD-10-CM | POA: Diagnosis not present

## 2011-10-25 DIAGNOSIS — L608 Other nail disorders: Secondary | ICD-10-CM | POA: Diagnosis not present

## 2011-10-25 DIAGNOSIS — L84 Corns and callosities: Secondary | ICD-10-CM | POA: Diagnosis not present

## 2011-10-30 DIAGNOSIS — G4700 Insomnia, unspecified: Secondary | ICD-10-CM | POA: Diagnosis not present

## 2011-10-30 DIAGNOSIS — R5381 Other malaise: Secondary | ICD-10-CM | POA: Diagnosis not present

## 2011-11-06 DIAGNOSIS — E7800 Pure hypercholesterolemia, unspecified: Secondary | ICD-10-CM | POA: Diagnosis not present

## 2011-11-06 DIAGNOSIS — E039 Hypothyroidism, unspecified: Secondary | ICD-10-CM | POA: Diagnosis not present

## 2011-11-06 DIAGNOSIS — E119 Type 2 diabetes mellitus without complications: Secondary | ICD-10-CM | POA: Diagnosis not present

## 2011-11-06 DIAGNOSIS — I10 Essential (primary) hypertension: Secondary | ICD-10-CM | POA: Diagnosis not present

## 2011-11-09 DIAGNOSIS — E119 Type 2 diabetes mellitus without complications: Secondary | ICD-10-CM | POA: Diagnosis not present

## 2011-11-09 DIAGNOSIS — J309 Allergic rhinitis, unspecified: Secondary | ICD-10-CM | POA: Diagnosis not present

## 2011-11-09 DIAGNOSIS — E039 Hypothyroidism, unspecified: Secondary | ICD-10-CM | POA: Diagnosis not present

## 2011-12-03 DIAGNOSIS — H4020X0 Unspecified primary angle-closure glaucoma, stage unspecified: Secondary | ICD-10-CM | POA: Diagnosis not present

## 2011-12-19 DIAGNOSIS — I10 Essential (primary) hypertension: Secondary | ICD-10-CM | POA: Diagnosis not present

## 2011-12-19 DIAGNOSIS — I2510 Atherosclerotic heart disease of native coronary artery without angina pectoris: Secondary | ICD-10-CM | POA: Diagnosis not present

## 2011-12-28 DIAGNOSIS — Z1231 Encounter for screening mammogram for malignant neoplasm of breast: Secondary | ICD-10-CM | POA: Diagnosis not present

## 2012-01-07 DIAGNOSIS — E1159 Type 2 diabetes mellitus with other circulatory complications: Secondary | ICD-10-CM | POA: Diagnosis not present

## 2012-01-07 DIAGNOSIS — L608 Other nail disorders: Secondary | ICD-10-CM | POA: Diagnosis not present

## 2012-01-07 DIAGNOSIS — I739 Peripheral vascular disease, unspecified: Secondary | ICD-10-CM | POA: Diagnosis not present

## 2012-03-10 DIAGNOSIS — E039 Hypothyroidism, unspecified: Secondary | ICD-10-CM | POA: Diagnosis not present

## 2012-03-10 DIAGNOSIS — E119 Type 2 diabetes mellitus without complications: Secondary | ICD-10-CM | POA: Diagnosis not present

## 2012-03-13 DIAGNOSIS — I10 Essential (primary) hypertension: Secondary | ICD-10-CM | POA: Diagnosis not present

## 2012-03-13 DIAGNOSIS — R197 Diarrhea, unspecified: Secondary | ICD-10-CM | POA: Diagnosis not present

## 2012-03-13 DIAGNOSIS — E119 Type 2 diabetes mellitus without complications: Secondary | ICD-10-CM | POA: Diagnosis not present

## 2012-03-13 DIAGNOSIS — R55 Syncope and collapse: Secondary | ICD-10-CM | POA: Diagnosis not present

## 2012-03-17 DIAGNOSIS — E1159 Type 2 diabetes mellitus with other circulatory complications: Secondary | ICD-10-CM | POA: Diagnosis not present

## 2012-03-17 DIAGNOSIS — I739 Peripheral vascular disease, unspecified: Secondary | ICD-10-CM | POA: Diagnosis not present

## 2012-03-17 DIAGNOSIS — M659 Synovitis and tenosynovitis, unspecified: Secondary | ICD-10-CM | POA: Diagnosis not present

## 2012-03-17 DIAGNOSIS — M65979 Unspecified synovitis and tenosynovitis, unspecified ankle and foot: Secondary | ICD-10-CM | POA: Diagnosis not present

## 2012-03-17 DIAGNOSIS — L608 Other nail disorders: Secondary | ICD-10-CM | POA: Diagnosis not present

## 2012-03-17 DIAGNOSIS — G5750 Tarsal tunnel syndrome, unspecified lower limb: Secondary | ICD-10-CM | POA: Diagnosis not present

## 2012-03-17 DIAGNOSIS — M25579 Pain in unspecified ankle and joints of unspecified foot: Secondary | ICD-10-CM | POA: Diagnosis not present

## 2012-03-17 DIAGNOSIS — IMO0002 Reserved for concepts with insufficient information to code with codable children: Secondary | ICD-10-CM | POA: Diagnosis not present

## 2012-03-25 DIAGNOSIS — J019 Acute sinusitis, unspecified: Secondary | ICD-10-CM | POA: Diagnosis not present

## 2012-06-02 DIAGNOSIS — E1139 Type 2 diabetes mellitus with other diabetic ophthalmic complication: Secondary | ICD-10-CM | POA: Diagnosis not present

## 2012-06-09 DIAGNOSIS — E119 Type 2 diabetes mellitus without complications: Secondary | ICD-10-CM | POA: Diagnosis not present

## 2012-06-09 DIAGNOSIS — E1159 Type 2 diabetes mellitus with other circulatory complications: Secondary | ICD-10-CM | POA: Diagnosis not present

## 2012-06-09 DIAGNOSIS — I10 Essential (primary) hypertension: Secondary | ICD-10-CM | POA: Diagnosis not present

## 2012-06-09 DIAGNOSIS — E039 Hypothyroidism, unspecified: Secondary | ICD-10-CM | POA: Diagnosis not present

## 2012-06-09 DIAGNOSIS — L608 Other nail disorders: Secondary | ICD-10-CM | POA: Diagnosis not present

## 2012-06-09 DIAGNOSIS — I739 Peripheral vascular disease, unspecified: Secondary | ICD-10-CM | POA: Diagnosis not present

## 2012-06-09 DIAGNOSIS — N390 Urinary tract infection, site not specified: Secondary | ICD-10-CM | POA: Diagnosis not present

## 2012-06-16 DIAGNOSIS — E7800 Pure hypercholesterolemia, unspecified: Secondary | ICD-10-CM | POA: Diagnosis not present

## 2012-06-16 DIAGNOSIS — E1149 Type 2 diabetes mellitus with other diabetic neurological complication: Secondary | ICD-10-CM | POA: Diagnosis not present

## 2012-06-16 DIAGNOSIS — Z23 Encounter for immunization: Secondary | ICD-10-CM | POA: Diagnosis not present

## 2012-06-16 DIAGNOSIS — E119 Type 2 diabetes mellitus without complications: Secondary | ICD-10-CM | POA: Diagnosis not present

## 2012-06-16 DIAGNOSIS — L749 Eccrine sweat disorder, unspecified: Secondary | ICD-10-CM | POA: Diagnosis not present

## 2012-06-16 DIAGNOSIS — M79609 Pain in unspecified limb: Secondary | ICD-10-CM | POA: Diagnosis not present

## 2012-06-17 DIAGNOSIS — L749 Eccrine sweat disorder, unspecified: Secondary | ICD-10-CM | POA: Diagnosis not present

## 2012-06-23 DIAGNOSIS — E782 Mixed hyperlipidemia: Secondary | ICD-10-CM | POA: Diagnosis not present

## 2012-06-23 DIAGNOSIS — R0609 Other forms of dyspnea: Secondary | ICD-10-CM | POA: Diagnosis not present

## 2012-06-23 DIAGNOSIS — R0989 Other specified symptoms and signs involving the circulatory and respiratory systems: Secondary | ICD-10-CM | POA: Diagnosis not present

## 2012-06-23 DIAGNOSIS — I2510 Atherosclerotic heart disease of native coronary artery without angina pectoris: Secondary | ICD-10-CM | POA: Diagnosis not present

## 2012-06-26 DIAGNOSIS — Z9289 Personal history of other medical treatment: Secondary | ICD-10-CM

## 2012-06-26 DIAGNOSIS — I2510 Atherosclerotic heart disease of native coronary artery without angina pectoris: Secondary | ICD-10-CM | POA: Diagnosis not present

## 2012-06-26 DIAGNOSIS — R0602 Shortness of breath: Secondary | ICD-10-CM | POA: Diagnosis not present

## 2012-06-26 DIAGNOSIS — I10 Essential (primary) hypertension: Secondary | ICD-10-CM | POA: Diagnosis not present

## 2012-06-26 DIAGNOSIS — R079 Chest pain, unspecified: Secondary | ICD-10-CM | POA: Diagnosis not present

## 2012-06-26 HISTORY — DX: Personal history of other medical treatment: Z92.89

## 2012-07-01 DIAGNOSIS — R51 Headache: Secondary | ICD-10-CM | POA: Diagnosis not present

## 2012-07-01 DIAGNOSIS — J329 Chronic sinusitis, unspecified: Secondary | ICD-10-CM | POA: Diagnosis not present

## 2012-07-08 DIAGNOSIS — E119 Type 2 diabetes mellitus without complications: Secondary | ICD-10-CM | POA: Diagnosis not present

## 2012-07-08 DIAGNOSIS — J45909 Unspecified asthma, uncomplicated: Secondary | ICD-10-CM | POA: Diagnosis not present

## 2012-07-08 DIAGNOSIS — J329 Chronic sinusitis, unspecified: Secondary | ICD-10-CM | POA: Diagnosis not present

## 2012-07-19 ENCOUNTER — Emergency Department (HOSPITAL_COMMUNITY): Payer: Medicare Other

## 2012-07-19 ENCOUNTER — Encounter (HOSPITAL_COMMUNITY): Payer: Self-pay | Admitting: Emergency Medicine

## 2012-07-19 ENCOUNTER — Emergency Department (HOSPITAL_COMMUNITY)
Admission: EM | Admit: 2012-07-19 | Discharge: 2012-07-19 | Disposition: A | Discharge status: Home / Self Care | Payer: Medicare Other | Attending: Emergency Medicine | Admitting: Emergency Medicine

## 2012-07-19 DIAGNOSIS — J019 Acute sinusitis, unspecified: Secondary | ICD-10-CM | POA: Diagnosis not present

## 2012-07-19 DIAGNOSIS — E119 Type 2 diabetes mellitus without complications: Secondary | ICD-10-CM | POA: Insufficient documentation

## 2012-07-19 DIAGNOSIS — J45901 Unspecified asthma with (acute) exacerbation: Secondary | ICD-10-CM | POA: Insufficient documentation

## 2012-07-19 DIAGNOSIS — J329 Chronic sinusitis, unspecified: Secondary | ICD-10-CM | POA: Insufficient documentation

## 2012-07-19 DIAGNOSIS — J3489 Other specified disorders of nose and nasal sinuses: Secondary | ICD-10-CM | POA: Diagnosis not present

## 2012-07-19 DIAGNOSIS — I2510 Atherosclerotic heart disease of native coronary artery without angina pectoris: Secondary | ICD-10-CM | POA: Diagnosis not present

## 2012-07-19 DIAGNOSIS — Z79899 Other long term (current) drug therapy: Secondary | ICD-10-CM | POA: Diagnosis not present

## 2012-07-19 DIAGNOSIS — Z7982 Long term (current) use of aspirin: Secondary | ICD-10-CM | POA: Insufficient documentation

## 2012-07-19 DIAGNOSIS — R05 Cough: Secondary | ICD-10-CM | POA: Insufficient documentation

## 2012-07-19 DIAGNOSIS — R0989 Other specified symptoms and signs involving the circulatory and respiratory systems: Secondary | ICD-10-CM | POA: Diagnosis not present

## 2012-07-19 DIAGNOSIS — R509 Fever, unspecified: Secondary | ICD-10-CM | POA: Diagnosis not present

## 2012-07-19 DIAGNOSIS — R059 Cough, unspecified: Secondary | ICD-10-CM | POA: Diagnosis not present

## 2012-07-19 HISTORY — DX: Unspecified asthma, uncomplicated: J45.909

## 2012-07-19 HISTORY — DX: Type 2 diabetes mellitus without complications: E11.9

## 2012-07-19 HISTORY — DX: Atherosclerotic heart disease of native coronary artery without angina pectoris: I25.10

## 2012-07-19 LAB — BASIC METABOLIC PANEL
BUN: 22 mg/dL (ref 6–23)
Calcium: 9.9 mg/dL (ref 8.4–10.5)
Creatinine, Ser: 0.75 mg/dL (ref 0.50–1.10)
GFR calc non Af Amer: 75 mL/min — ABNORMAL LOW (ref >90)
Glucose, Bld: 138 mg/dL — ABNORMAL HIGH (ref 70–99)
Sodium: 140 mEq/L (ref 135–145)

## 2012-07-19 LAB — CBC
HCT: 40.5 % (ref 36.0–46.0)
Hemoglobin: 12.5 g/dL (ref 12.0–15.0)
MCH: 26 pg (ref 26.0–34.0)
MCHC: 30.9 g/dL (ref 30.0–36.0)
MCV: 84.2 fL (ref 78.0–100.0)

## 2012-07-19 MED ORDER — ALBUTEROL SULFATE (5 MG/ML) 0.5% IN NEBU
2.5000 mg | INHALATION_SOLUTION | Freq: Once | RESPIRATORY_TRACT | Status: AC
  Administered 2012-07-19: 2.5 mg via RESPIRATORY_TRACT
  Filled 2012-07-19: qty 0.5

## 2012-07-19 MED ORDER — IOHEXOL 300 MG/ML  SOLN
75.0000 mL | Freq: Once | INTRAMUSCULAR | Status: AC | PRN
  Administered 2012-07-19: 75 mL via INTRAVENOUS

## 2012-07-19 MED ORDER — AMOXICILLIN-POT CLAVULANATE 875-125 MG PO TABS: 1.0000 | ORAL_TABLET | Freq: Two times a day (BID) | ORAL | Status: DC

## 2012-07-19 MED ORDER — ACETAMINOPHEN-CODEINE #3 300-30 MG PO TABS: 1.0000 | ORAL_TABLET | Freq: Four times a day (QID) | ORAL | Status: DC | PRN

## 2012-07-19 MED ORDER — AMOXICILLIN-POT CLAVULANATE 875-125 MG PO TABS
1.0000 | ORAL_TABLET | Freq: Once | ORAL | Status: AC
  Administered 2012-07-19: 1 via ORAL
  Filled 2012-07-19: qty 1

## 2012-07-19 MED ORDER — IPRATROPIUM BROMIDE 0.02 % IN SOLN
0.5000 mg | Freq: Once | RESPIRATORY_TRACT | Status: AC
  Administered 2012-07-19: 0.5 mg via RESPIRATORY_TRACT
  Filled 2012-07-19: qty 2.5

## 2012-07-19 NOTE — ED Provider Notes (Signed)
History     CSN: [id number]  Arrival date & time 07/19/12  1238   First MD Initiated Contact with Patient 07/19/12 1504      Chief Complaint  Patient presents with  . Headache  . Nasal Congestion    (Consider location/radiation/quality/duration/timing/severity/associated sxs/prior treatment) Patient is a 78 y.o. female presenting with URI. The history is provided by the patient.  URI The primary symptoms include fever (subjective), headaches, cough and wheezing. Primary symptoms do not include ear pain, nausea, vomiting or rash. Primary symptoms comment: Facial pain The current episode started more than 1 week ago. This is a new problem. The problem has not changed since onset. The headache began today. The headache developed gradually. Headache is a new problem. The headache is present intermittently. The headache is not associated with aura, double vision, eye pain, decreased vision, stiff neck, paresthesias or loss of balance.  Associated with: unknown. Symptoms associated with the illness include facial pain, sinus pressure, congestion and rhinorrhea. The illness is not associated with chills or plugged ear sensation. Risk factors for severe complications from URI include being elderly.    Past Medical History  Diagnosis Date  . Asthma   . Coronary artery disease   . Diabetes mellitus without complication     History reviewed. No pertinent past surgical history.  History reviewed. No pertinent family history.  History  Substance Use Topics  . Smoking status: Never Smoker   . Smokeless tobacco: Not on file  . Alcohol Use: No    OB History    Grav Para Term Preterm Abortions TAB SAB Ect Mult Living                  Review of Systems  Constitutional: Positive for fever (subjective). Negative for chills.  HENT: Positive for congestion, rhinorrhea and sinus pressure. Negative for hearing loss, ear pain, facial swelling, neck pain, tinnitus and ear discharge.   Eyes:  Negative for double vision and pain.  Respiratory: Positive for cough and wheezing.   Gastrointestinal: Negative for nausea and vomiting.  Skin: Negative for rash.  Neurological: Positive for headaches. Negative for paresthesias and loss of balance.  All other systems reviewed and are negative.    Allergies  Doxycycline  Home Medications  No current outpatient prescriptions on file.  BP 104/65  Pulse 83  Temp 98.3 F (36.8 C) (Oral)  Resp 18  SpO2 96%  Physical Exam  Nursing note and vitals reviewed. Constitutional: She is oriented to person, place, and time. She appears well-developed and well-nourished. No distress.  HENT:  Head: Normocephalic and atraumatic.  Right Ear: External ear normal.  Left Ear: External ear normal.  Mouth/Throat: No oropharyngeal exudate.  Eyes: EOM are normal. Pupils are equal, round, and reactive to light.  Neck: Normal range of motion. Neck supple. No JVD present. No thyromegaly present.  Cardiovascular: Normal rate and regular rhythm.  Exam reveals no friction rub.   No murmur heard. Pulmonary/Chest: Effort normal. No respiratory distress. She has wheezes (very mild). She has no rales.  Abdominal: Soft. She exhibits no distension. There is no tenderness. There is no rebound.  Musculoskeletal: Normal range of motion. She exhibits no edema.  Lymphadenopathy:    She has no cervical adenopathy.  Neurological: She is alert and oriented to person, place, and time.  Skin: She is not diaphoretic.    ED Course  Procedures (including critical care time)  Labs Reviewed  BASIC METABOLIC PANEL - Abnormal; Notable for the following:  Glucose, Bld 138 (*)     GFR calc non Af Amer 75 (*)     GFR calc Af Amer 87 (*)     All other components within normal limits  CBC   Dg Chest 2 View  07/19/2012  *RADIOLOGY REPORT*  Clinical Data: Congestion.  Headache.  CHEST - 2 VIEW  Comparison: 06/16/2012 and 11/15/2005 and 03/13/2004  Findings: The heart  size and pulmonary vascularity are normal and the lungs are clear.  No effusions.  Prominent pericardial fat pads.  Extensive coronary artery calcification visible on the lateral view.  No acute osseous abnormality.  IMPRESSION: No acute abnormality.  Extensive coronary artery calcification.   Original Report Authenticated By: Francene Boyers, M.D.     Date: 07/19/2012  Rate: 79  Rhythm: normal sinus rhythm  QRS Axis: normal  Intervals: normal  ST/T Wave abnormalities: normal  Conduction Disutrbances:none  Narrative Interpretation:   Old EKG Reviewed: none available    1. Sinusitis       MDM   Patient is an 78 year old female who presents with sinus congestion, rhinorrhea, facial pain and headache. Patient has been treated by her primary care doctor with 2 azithromycin regimens and a prednisone taper. She still having symptoms and states no improvement whatsoever. She also states a muscle tension in her neck and shoulders which has been present for the past 3 weeks, however worsening for the past 3-4 days. She denies chest pain. She has mild shortness of breath with exertion however she hasn't routinely and has been deconditioned as she is not moving around much for the past 3 weeks on exam patient has frontal and maxillary sinus tenderness to palpation bilaterally. Her ears and nose are clear. Patient's throat is clear. Her lungs have mild wheezing however she has good air movement. She is normal sinus rhythm on the monitor and no hypoxia with a good waveform. She is not on any oxygen.   I will scan her face and sinuses to see if she has anything going on besides sinusitis. Plan to give her Augmentin. I also give her a breathing treatment and check basic labs and EKG. CT Face with mild sphenoid sinusitis. Continue plan with Augmentin. Patient has small parotid mass, given instructions to f/u with PCP for ENT referral.        Elwin Mocha, MD 07/19/12 [phone]

## 2012-07-19 NOTE — ED Notes (Signed)
Pt sts treated for sinus infection with 2 antibiotics and still having HA and shoulder pain with head congestion; pt sts sent here by PCP due to not improving

## 2012-07-19 NOTE — ED Notes (Signed)
Pt returns from xray, family at bedside 

## 2012-07-19 NOTE — ED Provider Notes (Signed)
I saw and evaluated the patient, reviewed the resident's note and I agree with the findings and plan.  Patient with symptoms consistent with sinusitis. Awaiting her CAT scan however. We'll likely change her current antibiotics to Augmentin and discharged home  Toy Baker, MD 07/19/12 [phone]

## 2012-07-19 NOTE — ED Notes (Signed)
Patient transported to CT 

## 2012-07-19 NOTE — ED Notes (Signed)
The patient is AOx4 and comfortable with her discharge instructions. 

## 2012-07-24 DIAGNOSIS — E782 Mixed hyperlipidemia: Secondary | ICD-10-CM | POA: Diagnosis not present

## 2012-07-24 DIAGNOSIS — I2510 Atherosclerotic heart disease of native coronary artery without angina pectoris: Secondary | ICD-10-CM | POA: Diagnosis not present

## 2012-07-24 DIAGNOSIS — Z79899 Other long term (current) drug therapy: Secondary | ICD-10-CM | POA: Diagnosis not present

## 2012-07-24 DIAGNOSIS — E039 Hypothyroidism, unspecified: Secondary | ICD-10-CM | POA: Diagnosis not present

## 2012-07-26 NOTE — ED Provider Notes (Signed)
I saw and evaluated the patient, reviewed the resident's note and I agree with the findings and plan.  Toy Baker, MD 07/26/12 [phone]

## 2012-07-28 DIAGNOSIS — J329 Chronic sinusitis, unspecified: Secondary | ICD-10-CM | POA: Diagnosis not present

## 2012-07-28 DIAGNOSIS — E119 Type 2 diabetes mellitus without complications: Secondary | ICD-10-CM | POA: Diagnosis not present

## 2012-07-28 DIAGNOSIS — E7800 Pure hypercholesterolemia, unspecified: Secondary | ICD-10-CM | POA: Diagnosis not present

## 2012-07-28 DIAGNOSIS — J45909 Unspecified asthma, uncomplicated: Secondary | ICD-10-CM | POA: Diagnosis not present

## 2012-07-29 DIAGNOSIS — I2510 Atherosclerotic heart disease of native coronary artery without angina pectoris: Secondary | ICD-10-CM | POA: Diagnosis not present

## 2012-07-29 DIAGNOSIS — E039 Hypothyroidism, unspecified: Secondary | ICD-10-CM | POA: Diagnosis not present

## 2012-07-29 DIAGNOSIS — E782 Mixed hyperlipidemia: Secondary | ICD-10-CM | POA: Diagnosis not present

## 2012-07-31 DIAGNOSIS — H6120 Impacted cerumen, unspecified ear: Secondary | ICD-10-CM | POA: Diagnosis not present

## 2012-07-31 DIAGNOSIS — D37039 Neoplasm of uncertain behavior of the major salivary glands, unspecified: Secondary | ICD-10-CM | POA: Diagnosis not present

## 2012-08-28 DIAGNOSIS — J329 Chronic sinusitis, unspecified: Secondary | ICD-10-CM | POA: Diagnosis not present

## 2012-08-28 DIAGNOSIS — R51 Headache: Secondary | ICD-10-CM | POA: Diagnosis not present

## 2012-08-29 ENCOUNTER — Other Ambulatory Visit: Payer: Self-pay | Admitting: Otolaryngology

## 2012-08-29 DIAGNOSIS — J329 Chronic sinusitis, unspecified: Secondary | ICD-10-CM

## 2012-09-01 ENCOUNTER — Other Ambulatory Visit: Payer: Medicare Other

## 2012-09-04 ENCOUNTER — Other Ambulatory Visit: Payer: Medicare Other

## 2012-09-04 ENCOUNTER — Encounter: Payer: Self-pay | Admitting: Internal Medicine

## 2012-09-04 ENCOUNTER — Ambulatory Visit (INDEPENDENT_AMBULATORY_CARE_PROVIDER_SITE_OTHER): Payer: Medicare Other | Admitting: Internal Medicine

## 2012-09-04 VITALS — BP 116/74 | HR 91 | Ht 61.0 in | Wt 198.0 lb

## 2012-09-04 DIAGNOSIS — J309 Allergic rhinitis, unspecified: Secondary | ICD-10-CM | POA: Diagnosis not present

## 2012-09-04 DIAGNOSIS — J302 Other seasonal allergic rhinitis: Secondary | ICD-10-CM

## 2012-09-04 DIAGNOSIS — J45909 Unspecified asthma, uncomplicated: Secondary | ICD-10-CM | POA: Diagnosis not present

## 2012-09-04 NOTE — Progress Notes (Signed)
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 Now describes persistent nasal congestion and feels that she has had a sinusitis since October, 2013. Asthma flare in December needing nebulizer treatments twice daily, Z-Pak, 2 or 3 weeks of prednisone and Augmentin. CT maxillofacial-mild sphenoid sinusitis and an incidental right carotid lesion for which she is seeing Dr Lazarus Salines soon. Remains short of breath and aware of weight gain. Wheeze in cold air. CAD/ Dr Tresa Endo, valvular heart disease, rotablator procedure.  CXR 07/19/12 IMPRESSION:  No acute abnormality. Extensive coronary artery calcification.  Original Report Authenticated By: Francene Boyers, M.D.   Prior to Admission medications   Medication Sig Start Date End Date Taking? Authorizing Provider  albuterol (PROVENTIL HFA;VENTOLIN HFA) 108 (90 BASE) MCG/ACT inhaler Inhale 2 puffs into the lungs every 6 (six) hours as needed. For shortness of breath   Yes Historical Provider, MD  aspirin EC 81 MG tablet Take 81 mg by mouth daily.   Yes Historical Provider, MD  CALCIUM PO Take 1 tablet by mouth daily.   Yes Historical Provider, MD  Cholecalciferol (VITAMIN D3) 2000 UNITS capsule Take 2,000 Units by mouth daily.   Yes Historical Provider, MD  clopidogrel (PLAVIX) 75 MG tablet Take 75 mg by mouth daily.   Yes Historical Provider, MD  clorazepate (TRANXENE) 15 MG tablet Take 15 mg by mouth 3 (three) times daily as needed. anxiety   Yes Historical Provider, MD  ezetimibe (ZETIA) 10 MG tablet Take 10 mg by mouth at bedtime.   Yes Historical Provider, MD  glimepiride (AMARYL) 1 MG tablet Take 1 mg by mouth daily before breakfast.   Yes Historical Provider, MD    levocetirizine (XYZAL) 5 MG tablet Take 5 mg by mouth every morning.   Yes Historical Provider, MD  levothyroxine (SYNTHROID, LEVOTHROID) 50 MCG tablet Take 25 mcg by mouth daily.    Yes Historical Provider, MD  metoprolol succinate (TOPROL-XL) 100 MG 24 hr tablet Take 1 in am and 1.5 tablets in evenings   Yes Historical Provider, MD  MICARDIS HCT 80-25 MG per tablet Take 1 tablet by mouth daily. 08/01/12  Yes Historical Provider, MD  montelukast (SINGULAIR) 10 MG tablet Take 10 mg by mouth daily.   Yes Historical Provider, MD  Multiple Vitamin (MULTIVITAMIN WITH MINERALS) TABS Take 1 tablet by mouth daily.   Yes Historical Provider, MD  nitroGLYCERIN (NITROSTAT) 0.4 MG SL tablet Place 0.4 mg under the tongue every 5 (five) minutes as needed. For chest pain   Yes Historical Provider, MD  omeprazole (PRILOSEC) 20 MG capsule Take 20 mg by mouth daily.   Yes Historical Provider, MD  rosuvastatin (CRESTOR) 20 MG tablet Take 20 mg by mouth daily.   Yes Historical Provider, MD  gabapentin (NEURONTIN) 100 MG capsule Take 100 mg by mouth 3 (three) times daily.    Historical Provider, MD   Past Medical History  Diagnosis Date  . Asthma   . Coronary artery disease   . Diabetes mellitus without complication   . Hypothyroidism   . GERD (gastroesophageal reflux disease)   . Insomnia   . Anxiety   . Hypertension   . CAD (coronary artery disease)   . Osteopenia   .  Osteoarthritis    Past Surgical History  Procedure Date  . Appendectomy 1947  . Dilation and curettage of uterus 1960  . Vesicovaginal fistula closure w/ tah 1971    Dr Dewaine Conger  . Bladder tack 1970    Dr Earlene Plater  . Breast lumpectomy 1970    Left, Dr Barbera Setters  . Nasal sinus surgery 1980    Left; for chronic sinusitis  . Revision total knee arthroplasty 2005    Dr Charlann Boxer Right knee   Family History  Problem Relation Age of Onset  . Diabetes Father   . Parkinson's disease Father   . Heart disease Father   . Asthma Father   . Diabetes  Mother   . Heart disease Mother   . Heart disease Brother   . Diabetes Brother   . Diabetes Brother   . Diabetes Brother    History   Social History  . Marital Status: Widowed    Spouse Name: N/A    Number of Children: N/A  . Years of Education: N/A   Occupational History  . Not on file.   Social History Main Topics  . Smoking status: Never Smoker   . Smokeless tobacco: Not on file  . Alcohol Use: No  . Drug Use: No  . Sexually Active:    Other Topics Concern  . Not on file   Social History Narrative  . No narrative on file   ROS-see HPI Constitutional:   No-   weight loss, night sweats, fevers, chills, fatigue, lassitude. HEENT:   No-  headaches, difficulty swallowing, tooth/dental problems, sore throat,       No-  sneezing, itching, ear ache, +nasal congestion, +post nasal drip,  CV:  No-   chest pain, orthopnea, PND, swelling in lower extremities, anasarca,                                  dizziness, palpitations Resp:+ shortness of breath with exertion or at rest.              No-   productive cough,  No non-productive cough,  No- coughing up of blood.              No-   change in color of mucus.  + wheezing.   Skin: No-   rash or lesions. GI:  No-   heartburn, indigestion, abdominal pain, nausea, vomiting, diarrhea,                 change in bowel habits, loss of appetite GU: No-   dysuria, change in color of urine, no urgency or frequency.  No- flank pain. MS:  No-   joint pain or swelling.  No- decreased range of motion.  No- back pain. Neuro-     nothing unusual Psych:  No- change in mood or affect. No depression or anxiety.  No memory loss.  OBJ- Physical Exam General- Alert, Oriented, Affect-appropriate, Distress- none acute, obese Skin- rash-none, lesions- none, excoriation- none Lymphadenopathy- none Head- atraumatic            Eyes- Gross vision intact, PERRLA, conjunctivae and secretions clear            Ears- Hearing, canals-normal            Nose-  Clear, no-Septal dev, mucus, polyps, erosion, perforation             Throat- Mallampati III , mucosa clear ,  drainage- none, tonsils- atrophic Neck- flexible , trachea midline, no stridor , thyroid nl, carotid no bruit Chest - symmetrical excursion , unlabored           Heart/CV- RRR , no murmur heard , no gallop  , no rub, nl s1 s2                           - JVD- none , edema- none, stasis changes- none, varices- none           Lung- clear to P&A, wheeze- none, cough- none , dullness-none, rub- none           Chest wall-  Abd- tender-no, distended-no, bowel sounds-present, HSM- no Br/ Gen/ Rectal- Not done, not indicated Extrem- cyanosis- none, clubbing, none, atrophy- none, strength- nl Neuro- grossly intact to observation

## 2012-09-04 NOTE — Patient Instructions (Addendum)
Order- schedule PFT, 6 MWT   Sample Spiriva 1 daily   I agree that as you find you are able to walk and get exercise, it will help with your stamina and your weight.

## 2012-09-05 ENCOUNTER — Ambulatory Visit
Admission: RE | Admit: 2012-09-05 | Discharge: 2012-09-05 | Disposition: A | Discharge status: Home / Self Care | Payer: Medicare Other | Source: Ambulatory Visit | Attending: Otolaryngology | Admitting: Otolaryngology

## 2012-09-05 DIAGNOSIS — J329 Chronic sinusitis, unspecified: Secondary | ICD-10-CM

## 2012-09-08 DIAGNOSIS — J029 Acute pharyngitis, unspecified: Secondary | ICD-10-CM | POA: Diagnosis not present

## 2012-09-17 NOTE — Assessment & Plan Note (Signed)
Wheezing cold air. Known coronary and valvular heart disease. Dyspnea is probably multifactorial. Weight loss would help.  Plan-PFT, 6 minute walk test

## 2012-09-17 NOTE — Assessment & Plan Note (Signed)
She describes bothersome persistent head congestion, slowly getting better since treated for sinusitis in October. She has glaucoma and we will avoid Spiriva. I am suggesting short, cautious use of Afrin. Neti pot

## 2012-09-23 DIAGNOSIS — E7800 Pure hypercholesterolemia, unspecified: Secondary | ICD-10-CM | POA: Diagnosis not present

## 2012-09-23 DIAGNOSIS — E039 Hypothyroidism, unspecified: Secondary | ICD-10-CM | POA: Diagnosis not present

## 2012-09-23 DIAGNOSIS — E1149 Type 2 diabetes mellitus with other diabetic neurological complication: Secondary | ICD-10-CM | POA: Diagnosis not present

## 2012-09-23 DIAGNOSIS — E119 Type 2 diabetes mellitus without complications: Secondary | ICD-10-CM | POA: Diagnosis not present

## 2012-09-25 DIAGNOSIS — E039 Hypothyroidism, unspecified: Secondary | ICD-10-CM | POA: Diagnosis not present

## 2012-09-25 DIAGNOSIS — E119 Type 2 diabetes mellitus without complications: Secondary | ICD-10-CM | POA: Diagnosis not present

## 2012-09-25 DIAGNOSIS — M25559 Pain in unspecified hip: Secondary | ICD-10-CM | POA: Diagnosis not present

## 2012-09-25 DIAGNOSIS — E7800 Pure hypercholesterolemia, unspecified: Secondary | ICD-10-CM | POA: Diagnosis not present

## 2012-10-08 DIAGNOSIS — M1710 Unilateral primary osteoarthritis, unspecified knee: Secondary | ICD-10-CM | POA: Diagnosis not present

## 2012-10-10 ENCOUNTER — Encounter: Payer: Self-pay | Admitting: Internal Medicine

## 2012-10-10 ENCOUNTER — Ambulatory Visit (INDEPENDENT_AMBULATORY_CARE_PROVIDER_SITE_OTHER)
Admission: RE | Admit: 2012-10-10 | Discharge: 2012-10-10 | Disposition: A | Discharge status: Home / Self Care | Payer: Medicare Other | Source: Ambulatory Visit | Attending: Internal Medicine | Admitting: Internal Medicine

## 2012-10-10 ENCOUNTER — Ambulatory Visit (INDEPENDENT_AMBULATORY_CARE_PROVIDER_SITE_OTHER): Payer: Medicare Other | Admitting: Internal Medicine

## 2012-10-10 VITALS — BP 112/62 | HR 82 | Ht 61.0 in | Wt 199.4 lb

## 2012-10-10 DIAGNOSIS — J45909 Unspecified asthma, uncomplicated: Secondary | ICD-10-CM

## 2012-10-10 DIAGNOSIS — R0600 Dyspnea, unspecified: Secondary | ICD-10-CM

## 2012-10-10 LAB — PULMONARY FUNCTION TEST

## 2012-10-10 LAB — GLUCOSE, POCT (MANUAL RESULT ENTRY): POC Glucose: 97 mg/dl (ref 70–99)

## 2012-10-10 MED ORDER — BECLOMETHASONE DIPROPIONATE 80 MCG/ACT IN AERS: 2.0000 | INHALATION_SPRAY | Freq: Two times a day (BID) | RESPIRATORY_TRACT | Status: DC

## 2012-10-10 NOTE — Patient Instructions (Addendum)
Order- CXR dx asthma with bronchitis  Sample Qvar 80  2 puffs, then rinse mouth well, twice every day  You can continue using the albuterol rescue inhaler and nebulizer as before, as needed

## 2012-10-10 NOTE — Progress Notes (Signed)
PFT done today. 

## 2012-10-10 NOTE — Progress Notes (Signed)
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 Now describes persistent nasal congestion and feels that she has had a sinusitis since October, 2013. Asthma flare in December needing nebulizer treatments twice daily, Z-Pak, 2 or 3 weeks of prednisone and Augmentin. CT maxillofacial-mild sphenoid sinusitis and an incidental right carotid lesion for which she is seeing Dr Lazarus Salines soon. Remains short of breath and aware of weight gain. Wheeze in cold air. CAD/ Dr Tresa Endo, valvular heart disease, rotablator procedure.  CXR 07/19/12 IMPRESSION:  No acute abnormality. Extensive coronary artery calcification.  Original Report Authenticated By: Francene Boyers, M.D.   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 Dr Lazarus Salines had done CTmax/fac- left sphenoid air-fluid level. She says she has had "asthma" all winter, meaning shortness of breath and some chest tightness. Not relieved by Spiriva. Physically very inactive. She was unable to perform 6 minute walk test PFT: 10/10/2012-mild obstructive airways disease with insignificant response to bronchodilator. Air-trapping with increased residual volume. Diffusion mildly reduced. FEC 1.61/76%, 11.05/77%, FEV1/FVC 0.65, FEF 25-75% 0.56/33%. RV 134%, DLCO 76%.  ROS-see HPI Constitutional:   No-   weight loss, night sweats, fevers, chills, fatigue, lassitude. HEENT:   No-  headaches, difficulty swallowing, tooth/dental problems, sore throat,       No-  sneezing, itching, ear  ache, +nasal congestion, +post nasal drip,  CV:  No-   chest pain, orthopnea, PND, swelling in lower extremities, anasarca,                                  dizziness, palpitations Resp:+ shortness of breath with exertion or at rest.              No-   productive cough,  No non-productive cough,  No- coughing up of blood.              No-   change in color of mucus.  + wheezing.   Skin: No-   rash or lesions. GI:  No-   heartburn, indigestion, abdominal pain, nausea, vomiting,  GU:  MS:  No-   joint pain or swelling.   Neuro-     nothing unusual Psych:  No- change in mood or affect. No depression or anxiety.  No memory loss.  OBJ- Physical Exam General- Alert, Oriented, Affect-appropriate, Distress- none acute, obese Skin- rash-none, lesions- none, excoriation- none Lymphadenopathy- none Head- atraumatic            Eyes- Gross vision intact, PERRLA, conjunctivae and secretions clear            Ears- Hearing, canals-normal            Nose- Clear, no-Septal dev, mucus, polyps, erosion, perforation             Throat- Mallampati III , mucosa clear , drainage- none, tonsils- atrophic Neck- flexible , trachea midline, no stridor , thyroid nl, carotid no bruit Chest - symmetrical excursion , unlabored  Heart/CV- RRR , no murmur heard , no gallop  , no rub, nl s1 s2                           - JVD- none , edema- none, stasis changes- none, varices- none           Lung- clear to P&A, wheeze- none, cough- none , dullness-none, rub- none. Few crackles left greater than right base.           Chest wall-  Abd-  Br/ Gen/ Rectal- Not done, not indicated Extrem- cyanosis- none, clubbing, none, atrophy- none, strength- nl. + cane Neuro- grossly intact to observation

## 2012-10-12 NOTE — Assessment & Plan Note (Signed)
Broadly mild obstructive airways disease with air trapping. Dyspnea is markedly increased by obesity and deconditioning, and by an uncertain component of valvular heart disease. Plan-recommend weight loss and exercise some effort and walking to maintain endurance if allowed by her  Arthritic knees. Start Qvar 80 with discussion.

## 2012-10-12 NOTE — Assessment & Plan Note (Signed)
Mild residual sphenoid sinusitis previously identified.

## 2012-10-13 ENCOUNTER — Telehealth: Payer: Self-pay | Admitting: Internal Medicine

## 2012-10-13 NOTE — Telephone Encounter (Signed)
Notes Recorded by Waymon Budge, MD on 10/10/2012 at 7:58 PM CXR looks clear and normal    Pt aware of results. No questions or concerns at this time.

## 2012-10-13 NOTE — Progress Notes (Signed)
Quick Note:  Pt aware of results. ______ 

## 2012-10-17 ENCOUNTER — Telehealth: Payer: Self-pay | Admitting: Internal Medicine

## 2012-10-17 MED ORDER — BECLOMETHASONE DIPROPIONATE 80 MCG/ACT IN AERS: 2.0000 | INHALATION_SPRAY | Freq: Two times a day (BID) | RESPIRATORY_TRACT | Status: DC

## 2012-10-17 NOTE — Telephone Encounter (Signed)
I spoke with pt. She stated the QVAR 80 has helped with her breathing. Requesting RX.  Also c/o nasal congestion, watery eyes, runny nose, facial pressure, PND x "a good while". No f/c/s/jn/v. Requesting recs. Please advise Dr. Maple Hudson thanks Last OV 10/10/12 Pending 01/07/13  Allergies  Allergen Reactions  . Doxycycline

## 2012-10-17 NOTE — Telephone Encounter (Signed)
Per CY---  rx for astelin nasal spray  1-2 puff bid prn  rx qvar  2 puffs  Once daily--rinse after each use refill prn.

## 2012-10-17 NOTE — Telephone Encounter (Signed)
I spoke with pt. Aware of CDY recs. She stated she already has the astelin nasal spray. She did not need rx for this. Nothing further was needed.

## 2012-11-03 ENCOUNTER — Other Ambulatory Visit: Payer: Self-pay | Admitting: Gastroenterology

## 2012-11-07 ENCOUNTER — Ambulatory Visit
Admission: RE | Admit: 2012-11-07 | Discharge: 2012-11-07 | Disposition: A | Discharge status: Home / Self Care | Payer: Medicare Other | Source: Ambulatory Visit | Attending: Gastroenterology | Admitting: Gastroenterology

## 2012-11-07 DIAGNOSIS — R1013 Epigastric pain: Secondary | ICD-10-CM

## 2012-11-07 DIAGNOSIS — K449 Diaphragmatic hernia without obstruction or gangrene: Secondary | ICD-10-CM | POA: Diagnosis not present

## 2012-11-07 DIAGNOSIS — R110 Nausea: Secondary | ICD-10-CM | POA: Diagnosis not present

## 2012-11-07 DIAGNOSIS — K219 Gastro-esophageal reflux disease without esophagitis: Secondary | ICD-10-CM | POA: Diagnosis not present

## 2012-11-12 DIAGNOSIS — R110 Nausea: Secondary | ICD-10-CM | POA: Diagnosis not present

## 2012-11-18 DIAGNOSIS — G4700 Insomnia, unspecified: Secondary | ICD-10-CM | POA: Diagnosis not present

## 2012-11-18 DIAGNOSIS — F411 Generalized anxiety disorder: Secondary | ICD-10-CM | POA: Diagnosis not present

## 2012-11-18 DIAGNOSIS — K219 Gastro-esophageal reflux disease without esophagitis: Secondary | ICD-10-CM | POA: Diagnosis not present

## 2012-11-30 ENCOUNTER — Encounter (HOSPITAL_COMMUNITY): Payer: Self-pay | Admitting: Emergency Medicine

## 2012-11-30 ENCOUNTER — Emergency Department (HOSPITAL_COMMUNITY): Payer: Medicare Other

## 2012-11-30 ENCOUNTER — Observation Stay (HOSPITAL_COMMUNITY)
Admission: EM | Admit: 2012-11-30 | Discharge: 2012-12-01 | Disposition: A | Discharge status: Home / Self Care | Payer: Medicare Other | Attending: Cardiovascular Disease | Admitting: Cardiovascular Disease

## 2012-11-30 DIAGNOSIS — I2584 Coronary atherosclerosis due to calcified coronary lesion: Secondary | ICD-10-CM | POA: Diagnosis not present

## 2012-11-30 DIAGNOSIS — R4181 Age-related cognitive decline: Secondary | ICD-10-CM | POA: Diagnosis not present

## 2012-11-30 DIAGNOSIS — I080 Rheumatic disorders of both mitral and aortic valves: Secondary | ICD-10-CM | POA: Insufficient documentation

## 2012-11-30 DIAGNOSIS — J45909 Unspecified asthma, uncomplicated: Secondary | ICD-10-CM | POA: Insufficient documentation

## 2012-11-30 DIAGNOSIS — E785 Hyperlipidemia, unspecified: Secondary | ICD-10-CM | POA: Diagnosis present

## 2012-11-30 DIAGNOSIS — E039 Hypothyroidism, unspecified: Secondary | ICD-10-CM | POA: Diagnosis not present

## 2012-11-30 DIAGNOSIS — I2510 Atherosclerotic heart disease of native coronary artery without angina pectoris: Secondary | ICD-10-CM | POA: Diagnosis present

## 2012-11-30 DIAGNOSIS — R079 Chest pain, unspecified: Secondary | ICD-10-CM | POA: Diagnosis present

## 2012-11-30 DIAGNOSIS — E119 Type 2 diabetes mellitus without complications: Secondary | ICD-10-CM | POA: Diagnosis present

## 2012-11-30 DIAGNOSIS — J40 Bronchitis, not specified as acute or chronic: Secondary | ICD-10-CM | POA: Insufficient documentation

## 2012-11-30 DIAGNOSIS — I10 Essential (primary) hypertension: Secondary | ICD-10-CM | POA: Diagnosis not present

## 2012-11-30 DIAGNOSIS — J4520 Mild intermittent asthma, uncomplicated: Secondary | ICD-10-CM | POA: Diagnosis present

## 2012-11-30 DIAGNOSIS — I4891 Unspecified atrial fibrillation: Principal | ICD-10-CM | POA: Insufficient documentation

## 2012-11-30 DIAGNOSIS — R9431 Abnormal electrocardiogram [ECG] [EKG]: Secondary | ICD-10-CM | POA: Diagnosis present

## 2012-11-30 DIAGNOSIS — R110 Nausea: Secondary | ICD-10-CM | POA: Diagnosis not present

## 2012-11-30 DIAGNOSIS — I480 Paroxysmal atrial fibrillation: Secondary | ICD-10-CM | POA: Diagnosis present

## 2012-11-30 LAB — CBC
MCHC: 33 g/dL (ref 30.0–36.0)
RDW: 13.7 % (ref 11.5–15.5)

## 2012-11-30 LAB — COMPREHENSIVE METABOLIC PANEL
ALT: 22 U/L (ref 0–35)
Albumin: 3.5 g/dL (ref 3.5–5.2)
Alkaline Phosphatase: 62 U/L (ref 39–117)
BUN: 20 mg/dL (ref 6–23)
Potassium: 3.8 mEq/L (ref 3.5–5.1)
Sodium: 140 mEq/L (ref 135–145)
Total Protein: 7.1 g/dL (ref 6.0–8.3)

## 2012-11-30 LAB — POCT I-STAT TROPONIN I
Troponin i, poc: 0 ng/mL (ref 0.00–0.08)
Troponin i, poc: 0 ng/mL (ref 0.00–0.08)

## 2012-11-30 MED ORDER — ASPIRIN EC 81 MG PO TBEC
81.0000 mg | DELAYED_RELEASE_TABLET | Freq: Every day | ORAL | Status: DC
  Administered 2012-12-01: 81 mg via ORAL
  Filled 2012-11-30: qty 1

## 2012-11-30 MED ORDER — ZOLPIDEM TARTRATE 5 MG PO TABS
10.0000 mg | ORAL_TABLET | Freq: Every evening | ORAL | Status: DC | PRN
  Administered 2012-12-01: 10 mg via ORAL
  Filled 2012-11-30: qty 2

## 2012-11-30 MED ORDER — ALBUTEROL SULFATE HFA 108 (90 BASE) MCG/ACT IN AERS
2.0000 | INHALATION_SPRAY | Freq: Four times a day (QID) | RESPIRATORY_TRACT | Status: DC | PRN
  Filled 2012-11-30: qty 6.7

## 2012-11-30 MED ORDER — VITAMIN D 1000 UNITS PO TABS
2000.0000 [IU] | ORAL_TABLET | Freq: Every day | ORAL | Status: DC
  Administered 2012-12-01: 2000 [IU] via ORAL
  Filled 2012-11-30: qty 2

## 2012-11-30 MED ORDER — ENOXAPARIN SODIUM 100 MG/ML ~~LOC~~ SOLN: 1.0000 mg/kg | Freq: Once | SUBCUTANEOUS | Status: DC

## 2012-11-30 MED ORDER — LEVOTHYROXINE SODIUM 25 MCG PO TABS
25.0000 ug | ORAL_TABLET | Freq: Every day | ORAL | Status: DC
  Administered 2012-12-01: 25 ug via ORAL
  Filled 2012-11-30: qty 1

## 2012-11-30 MED ORDER — FLUTICASONE PROPIONATE HFA 44 MCG/ACT IN AERO
1.0000 | INHALATION_SPRAY | Freq: Two times a day (BID) | RESPIRATORY_TRACT | Status: DC
  Administered 2012-12-01: 1 via RESPIRATORY_TRACT
  Filled 2012-11-30: qty 10.6

## 2012-11-30 MED ORDER — EZETIMIBE 10 MG PO TABS
10.0000 mg | ORAL_TABLET | Freq: Every day | ORAL | Status: DC
  Administered 2012-12-01: 10 mg via ORAL
  Filled 2012-11-30 (×2): qty 1

## 2012-11-30 MED ORDER — SODIUM CHLORIDE 0.9 % IJ SOLN
3.0000 mL | Freq: Two times a day (BID) | INTRAMUSCULAR | Status: DC
  Administered 2012-12-01 (×2): 3 mL via INTRAVENOUS

## 2012-11-30 MED ORDER — PANTOPRAZOLE SODIUM 40 MG PO TBEC
40.0000 mg | DELAYED_RELEASE_TABLET | Freq: Every day | ORAL | Status: DC
  Administered 2012-12-01: 40 mg via ORAL
  Filled 2012-11-30: qty 1

## 2012-11-30 MED ORDER — ATORVASTATIN CALCIUM 40 MG PO TABS
40.0000 mg | ORAL_TABLET | Freq: Every day | ORAL | Status: DC
  Filled 2012-11-30: qty 1

## 2012-11-30 MED ORDER — TELMISARTAN-HCTZ 80-25 MG PO TABS: 1.0000 | ORAL_TABLET | Freq: Every day | ORAL | Status: DC

## 2012-11-30 MED ORDER — SACCHAROMYCES BOULARDII 250 MG PO CAPS
250.0000 mg | ORAL_CAPSULE | Freq: Two times a day (BID) | ORAL | Status: DC
  Administered 2012-12-01 (×2): 250 mg via ORAL
  Filled 2012-11-30 (×3): qty 1

## 2012-11-30 MED ORDER — IRBESARTAN 300 MG PO TABS
300.0000 mg | ORAL_TABLET | Freq: Every day | ORAL | Status: DC
  Administered 2012-12-01: 300 mg via ORAL
  Filled 2012-11-30: qty 1

## 2012-11-30 MED ORDER — LORAZEPAM 0.5 MG PO TABS: 0.5000 mg | ORAL_TABLET | Freq: Every day | ORAL | Status: DC

## 2012-11-30 MED ORDER — CLOPIDOGREL BISULFATE 75 MG PO TABS
75.0000 mg | ORAL_TABLET | Freq: Every day | ORAL | Status: DC
  Administered 2012-12-01: 75 mg via ORAL

## 2012-11-30 MED ORDER — METOPROLOL SUCCINATE ER 100 MG PO TB24
100.0000 mg | ORAL_TABLET | Freq: Every day | ORAL | Status: DC
  Administered 2012-12-01: 100 mg via ORAL
  Filled 2012-11-30: qty 1

## 2012-11-30 MED ORDER — NITROGLYCERIN 0.4 MG SL SUBL: 0.4000 mg | SUBLINGUAL_TABLET | SUBLINGUAL | Status: DC | PRN

## 2012-11-30 MED ORDER — METOCLOPRAMIDE HCL 5 MG PO TABS
5.0000 mg | ORAL_TABLET | Freq: Two times a day (BID) | ORAL | Status: DC
  Administered 2012-12-01 (×2): 5 mg via ORAL
  Filled 2012-11-30 (×4): qty 1

## 2012-11-30 MED ORDER — GLIMEPIRIDE 1 MG PO TABS
1.0000 mg | ORAL_TABLET | Freq: Every day | ORAL | Status: DC
Start: 2012-12-01 — End: 2012-12-01
  Administered 2012-12-01: 1 mg via ORAL
  Filled 2012-11-30 (×2): qty 1

## 2012-11-30 MED ORDER — LORATADINE 10 MG PO TABS
10.0000 mg | ORAL_TABLET | Freq: Every day | ORAL | Status: DC
  Administered 2012-12-01: 10 mg via ORAL
  Filled 2012-11-30: qty 1

## 2012-11-30 MED ORDER — GI COCKTAIL ~~LOC~~
30.0000 mL | Freq: Once | ORAL | Status: AC
  Administered 2012-11-30: 30 mL via ORAL
  Filled 2012-11-30: qty 30

## 2012-11-30 MED ORDER — HYDROCHLOROTHIAZIDE 25 MG PO TABS
25.0000 mg | ORAL_TABLET | Freq: Every day | ORAL | Status: DC
  Administered 2012-12-01: 25 mg via ORAL
  Filled 2012-11-30: qty 1

## 2012-11-30 MED ORDER — ASPIRIN EC 81 MG PO TBEC
81.0000 mg | DELAYED_RELEASE_TABLET | Freq: Every day | ORAL | Status: DC
  Filled 2012-11-30: qty 1

## 2012-11-30 MED ORDER — LEVOCETIRIZINE DIHYDROCHLORIDE 5 MG PO TABS: 5.0000 mg | ORAL_TABLET | Freq: Every morning | ORAL | Status: DC

## 2012-11-30 MED ORDER — MONTELUKAST SODIUM 10 MG PO TABS
10.0000 mg | ORAL_TABLET | Freq: Every day | ORAL | Status: DC
  Administered 2012-12-01: 10 mg via ORAL
  Filled 2012-11-30: qty 1

## 2012-11-30 NOTE — ED Notes (Signed)
Cardiologist at bedside.  

## 2012-11-30 NOTE — Consult Note (Addendum)
Reason for Consult: Chest Pain  Referring Physician:  Physician   HPI: The patient is an 79 y/o female and former patient of Dr. Clarene Duke. She is now followed by Dr. Tresa Endo. She has a history of known CAD. In 2000, she underwent a high-speed rotational atherectomy of her LAD in the region of a very large diagonal vessel in a calcified LAD system. This was done by Dr. Tresa Endo. She had excellent angiographic result. She has remained cardiovascuarly stable since that time. In November 2013, she underwent a nuclear perfusion study which was normal and without scar or ischemia. A 2D echo at that time also revealed normal systolic function with suggestion of grade 1 diastolic dysfunction with a mitral valve E-to A ratio of 0.63. She did have mild mitral annular calcification with mild MR, mild TR, as well as moderate aortic sclerosis without stenosis. Her history is also significant for HTN, HLD, hypothyroidism and asthma. She presented to the [facility] ER today with a complaint of substernal chest pressure. She reports that she was in her usual state of health until ~9:00 am. She noted chest pressure that was non-radiating and persisted for several hours. No alleviating or exacerbating factors. Non-positional, non-pleuritic. It did not change with exertion. She had associated SOB, palpitations and mild dizziness. She denies diaphoresis, n/v, syncope/presyncope. No orthopnea, PND or LEE. No hematochezia, melena or hematuria. No recent illness, cough, fever or chills. Prior to presenting to the ED, she took 2 nitroglycerin and the pain was not responsive. On arrival, EKGs showed atrial fibrillation with RVR/ atrial flutter with a 3:1 heart block. However, she spontaneously converted to NSR. Her chest pain resolved once in sinus rhythm. She currently endorses no other symptoms. Her HR is in the 80s. BP is stable. Current EKGs show no acute changes.Troponins negative x 2. CXR is normal. Labs are WNL. She reports daily  compliance with all of her home medications.     Past Medical History  Diagnosis Date  . Asthma   . Coronary artery disease   . Diabetes mellitus without complication   . Hypothyroidism   . GERD (gastroesophageal reflux disease)   . Insomnia   . Anxiety   . Hypertension   . CAD (coronary artery disease)   . Osteopenia   . Osteoarthritis     Past Surgical History  Procedure Laterality Date  . Appendectomy  1947  . Dilation and curettage of uterus  1960  . Vesicovaginal fistula closure w/ tah  1971    Dr Dewaine Conger  . Bladder tack  1970    Dr Earlene Plater  . Breast lumpectomy  1970    Left, Dr Barbera Setters  . Nasal sinus surgery  1980    Left; for chronic sinusitis  . Revision total knee arthroplasty  2005    Dr Charlann Boxer Right knee    Family History  Problem Relation Age of Onset  . Diabetes Father   . Parkinson's disease Father   . Heart disease Father   . Asthma Father   . Diabetes Mother   . Heart disease Mother   . Heart disease Brother   . Diabetes Brother   . Diabetes Brother   . Diabetes Brother     Social History:  reports that she has never smoked. She does not have any smokeless tobacco history on file. She reports that she does not drink alcohol or use illicit drugs.  Allergies: No Active Allergies  Medications: Prior to Admission:  (Not in a hospital admission)  Results for orders placed during the hospital encounter of 11/30/12 (from the past 48 hour(s))  CBC     Status: Abnormal   Collection Time    11/30/12  2:06 PM      Result Value Range   WBC 6.6  4.0 - 10.5 K/uL   RBC 4.42  3.87 - 5.11 MIL/uL   Hemoglobin 11.9 (*) 12.0 - 15.0 g/dL   HCT 16.1  09.6 - 04.5 %   MCV 81.7  78.0 - 100.0 fL   MCH 26.9  26.0 - 34.0 pg   MCHC 33.0  30.0 - 36.0 g/dL   RDW 40.9  81.1 - 91.4 %   Platelets 209  150 - 400 K/uL  COMPREHENSIVE METABOLIC PANEL     Status: Abnormal   Collection Time    11/30/12  2:06 PM      Result Value Range   Sodium 140  135 - 145 mEq/L    Potassium 3.8  3.5 - 5.1 mEq/L   Chloride 102  96 - 112 mEq/L   CO2 28  19 - 32 mEq/L   Glucose, Bld 112 (*) 70 - 99 mg/dL   BUN 20  6 - 23 mg/dL   Creatinine, Ser 7.82  0.50 - 1.10 mg/dL   Calcium 9.4  8.4 - 95.6 mg/dL   Total Protein 7.1  6.0 - 8.3 g/dL   Albumin 3.5  3.5 - 5.2 g/dL   AST 23  0 - 37 U/L   ALT 22  0 - 35 U/L   Alkaline Phosphatase 62  39 - 117 U/L   Total Bilirubin 0.3  0.3 - 1.2 mg/dL   GFR calc non Af Amer 76 (*) >90 mL/min   GFR calc Af Amer 89 (*) >90 mL/min   Comment:            The eGFR has been calculated     using the CKD EPI equation.     This calculation has not been     validated in all clinical     situations.     eGFR's persistently     <90 mL/min signify     possible Chronic Kidney Disease.  POCT I-STAT TROPONIN I     Status: None   Collection Time    11/30/12  2:20 PM      Result Value Range   Troponin i, poc 0.00  0.00 - 0.08 ng/mL   Comment 3            Comment: Due to the release kinetics of cTnI,     a negative result within the first hours     of the onset of symptoms does not rule out     myocardial infarction with certainty.     If myocardial infarction is still suspected,     repeat the test at appropriate intervals.  POCT I-STAT TROPONIN I     Status: None   Collection Time    11/30/12  5:32 PM      Result Value Range   Troponin i, poc 0.00  0.00 - 0.08 ng/mL   Comment 3            Comment: Due to the release kinetics of cTnI,     a negative result within the first hours     of the onset of symptoms does not rule out     myocardial infarction with certainty.     If myocardial infarction is still suspected,  repeat the test at appropriate intervals.    Dg Chest 2 View  11/30/2012  *RADIOLOGY REPORT*  Clinical Data: Chest pain  CHEST - 2 VIEW  Comparison: 10/10/2012.  Findings: Lungs are essentially clear.  No focal consolidation.  No pleural effusion or pneumothorax.  The heart is top normal in size.  Mild degenerative  changes of the visualized thoracolumbar spine.  IMPRESSION: No evidence of acute cardiopulmonary disease.   Original Report Authenticated By: Charline Bills, M.D.     Review of Systems  Constitutional: Negative for fever, chills, malaise/fatigue and diaphoresis.  Respiratory: Positive for shortness of breath. Negative for cough.   Cardiovascular: Positive for chest pain and palpitations. Negative for orthopnea, claudication, leg swelling and PND.  Gastrointestinal: Negative for nausea, vomiting, diarrhea, constipation, blood in stool and melena.  Genitourinary: Negative for hematuria.  Musculoskeletal: Negative for falls.  Neurological: Positive for dizziness. Negative for loss of consciousness and weakness.   Blood pressure 131/76, pulse 88, temperature 98.6 F (37 C), temperature source Oral, resp. rate 16, SpO2 95.00%. Physical Exam  Constitutional: She is oriented to person, place, and time. She appears well-developed and well-nourished. No distress.  HENT:  Head: Atraumatic.  Eyes: Conjunctivae and EOM are normal. Pupils are equal, round, and reactive to light.  Neck: No JVD present. Carotid bruit is not present. No thyromegaly present.  Cardiovascular: Normal rate, regular rhythm and intact distal pulses.  Exam reveals no gallop and no friction rub.   No murmur heard. Pulses:      Radial pulses are 2+ on the right side, and 2+ on the left side.       Dorsalis pedis pulses are 2+ on the right side, and 2+ on the left side.  Respiratory: Effort normal and breath sounds normal. No respiratory distress. She has no wheezes. She has no rales.  GI: Soft. Bowel sounds are normal. She exhibits no distension and no mass. There is no tenderness.  Musculoskeletal: She exhibits no edema.  Lymphadenopathy:    She has no cervical adenopathy.  Neurological: She is alert and oriented to person, place, and time.  Skin: Skin is warm and dry. She is not diaphoretic.  Psychiatric: She has a normal  mood and affect. Her behavior is normal. Judgment and thought content normal.    Assessment/Plan: Principal Problem:   Atrial fibrillation with RVR Active Problems:   Chest pain   CORONARY ARTERY DISEASE   Asthma with bronchitis   HTN (hypertension)   HLD (hyperlipidemia)   Hypothyroid  Plan: She is now in NSR. Chest pain has now resolved. EKGs while in SR show no acute changes. Troponin negative x 2. CXR normal. Consider discharging home with OP F/U and 30 day event monitor vs. admitting for telemetry monitoring overnight. MD to follow with further recommendation.    Allayne Butcher, PA-C 11/30/2012, 6:39 PM    Patient seen and examined. Agree with assessment and plan. Very pleasant 78 yo WF with CAD, s/p remote HSRA of very calcified LAD, h/o GERD,who presents tonight with chest pressure different than prior pain and not certain if her GI discomfort. She has shown transitory conduction changes on RBBB and then LBBB; now NSR without bundle. Although ER physician felt pt in AF with 3:1 block, I am not certain and this may be sinus rhythm with LBBB. Will keep overnight. Serial enzymes. Continue beta blocker. ECG in am.   Lennette Bihari, MD, [facility] 11/30/2012 8:00 PM

## 2012-11-30 NOTE — ED Notes (Signed)
Onset today chest pain 8/10 pressure with no shortness of breath. Took own nitro 2 tabs no relief. Called EMS given additional 2 nitro SL. Zofran 4mg  IV for nausea, and 324mg  aspirin PO. Pain improved 6/10 pressure.

## 2012-11-30 NOTE — ED Notes (Signed)
Cardiology at bedside.

## 2012-12-01 DIAGNOSIS — R079 Chest pain, unspecified: Secondary | ICD-10-CM | POA: Diagnosis not present

## 2012-12-01 DIAGNOSIS — I2510 Atherosclerotic heart disease of native coronary artery without angina pectoris: Secondary | ICD-10-CM | POA: Diagnosis not present

## 2012-12-01 DIAGNOSIS — E119 Type 2 diabetes mellitus without complications: Secondary | ICD-10-CM | POA: Diagnosis not present

## 2012-12-01 DIAGNOSIS — R9431 Abnormal electrocardiogram [ECG] [EKG]: Secondary | ICD-10-CM | POA: Diagnosis present

## 2012-12-01 DIAGNOSIS — I4891 Unspecified atrial fibrillation: Secondary | ICD-10-CM | POA: Diagnosis not present

## 2012-12-01 LAB — BASIC METABOLIC PANEL
CO2: 31 mEq/L (ref 19–32)
Chloride: 103 mEq/L (ref 96–112)
Creatinine, Ser: 0.73 mg/dL (ref 0.50–1.10)
Sodium: 144 mEq/L (ref 135–145)

## 2012-12-01 LAB — CBC
HCT: 36.8 % (ref 36.0–46.0)
Hemoglobin: 12 g/dL (ref 12.0–15.0)
MCV: 81.2 fL (ref 78.0–100.0)
RBC: 4.53 MIL/uL (ref 3.87–5.11)
WBC: 5.6 10*3/uL (ref 4.0–10.5)

## 2012-12-01 LAB — TSH: TSH: 0.4 u[IU]/mL (ref 0.350–4.500)

## 2012-12-01 LAB — TROPONIN I: Troponin I: <0.3 ng/mL (ref <0.30)

## 2012-12-01 MED ORDER — POTASSIUM CHLORIDE CRYS ER 20 MEQ PO TBCR
40.0000 meq | EXTENDED_RELEASE_TABLET | Freq: Once | ORAL | Status: AC
  Administered 2012-12-01: 40 meq via ORAL
  Filled 2012-12-01: qty 2

## 2012-12-01 MED ORDER — METOPROLOL SUCCINATE ER 100 MG PO TB24: ORAL_TABLET | ORAL | Status: DC

## 2012-12-01 MED ORDER — METOPROLOL SUCCINATE ER 100 MG PO TB24: 100.0000 mg | ORAL_TABLET | Freq: Every day | ORAL | Status: DC

## 2012-12-01 NOTE — Discharge Summary (Signed)
Patient ID: [name],  MRN: [id number], DOB/AGE: 1925-01-12 79 y.o.  Admit date: 11/30/2012 Discharge date: 12/01/2012  Primary Care Provider: Dr Pearson Grippe Primary Cardiologist: Dr Tresa Endo  Discharge Diagnoses:  Principal Problem:   PAF - with RVR, converted sponateously  Active Problems:   Chest pain   DIABETES MELLITUS   CAD- HSRA LAD 2000, low risk Myoview 11/13   Asthma with bronchitis   HTN (hypertension)   HLD (hyperlipidemia)   Hypothyroid   Nonspecific abnormal electrocardiogram (ECG) - intermittent BBB     Hospital Course:  The patient is an 79 y/o female and former patient of Dr. Clarene Duke. She is now followed by Dr. Tresa Endo. She has a history of known CAD. In 2000, she underwent a high-speed rotational atherectomy of her LAD in the region of a very large diagonal vessel in a calcified LAD system. This was done by Dr. Tresa Endo. She had excellent angiographic result. She has remained cardiovascuarly stable since that time. In November 2013, she underwent a nuclear perfusion study which was normal and without scar or ischemia. A 2D echo at that time also revealed normal systolic function with suggestion of grade 1 diastolic dysfunction with a mitral valve E-to A ratio of 0.63. She did have mild mitral annular calcification with mild MR, mild TR, as well as moderate aortic sclerosis without stenosis. Her history is also significant for HTN, HLD, hypothyroidism and asthma. She presented to the [facility] ER today with a complaint of substernal chest pressure. She reports that she was in her usual state of health until ~9:00 am 11/30/12. She noted chest pressure that was non-radiating and persisted for several hours. No alleviating or exacerbating factors. Non-positional, non-pleuritic. It did not change with exertion. She had associated SOB, palpitations and mild dizziness. She denies diaphoresis, n/v, syncope/presyncope. No orthopnea, PND or LEE. No hematochezia, melena or hematuria. No recent  illness, cough, fever or chills. Prior to presenting to the ED, she took 2 nitroglycerin and the pain was not responsive. On arrival, EKGs showed atrial fibrillation with RVR/ atrial flutter with a 3:1 heart block. However, she spontaneously converted to NSR. Her chest pain resolved once in sinus rhythm. She currently endorses no other symptoms. Her HR is in the 80s. BP is stable. Her EKG show no acute changes.Troponins negative x 2. CXR is normal. Labs are WNL. She reports daily compliance with all of her home medications. In the ER she had transitory conduction changes on her EKG with  RBBB and then LBBB; then NSR without bundle. She was admitted for Obs. Her Troponin were negative. She had no further arrythmia and Dr Tresa Endo felt she could be discharged 12/01/12. She will follow up in the office next week. Her beta blocker was cut to 100mg  of Toprol daily.    Discharge Vitals:  Blood pressure 140/66, pulse 89, temperature 98.2 F (36.8 C), temperature source Oral, resp. rate 18, height 5\' 1"  (1.549 m), weight 88.497 kg (195 lb 1.6 oz), SpO2 97.00%.    Labs: Results for orders placed during the hospital encounter of 11/30/12 (from the past 48 hour(s))  CBC     Status: Abnormal   Collection Time    11/30/12  2:06 PM      Result Value Range   WBC 6.6  4.0 - 10.5 K/uL   RBC 4.42  3.87 - 5.11 MIL/uL   Hemoglobin 11.9 (*) 12.0 - 15.0 g/dL   HCT 08.6  57.8 - 46.9 %   MCV 81.7  78.0 -  100.0 fL   MCH 26.9  26.0 - 34.0 pg   MCHC 33.0  30.0 - 36.0 g/dL   RDW 95.6  21.3 - 08.6 %   Platelets 209  150 - 400 K/uL  COMPREHENSIVE METABOLIC PANEL     Status: Abnormal   Collection Time    11/30/12  2:06 PM      Result Value Range   Sodium 140  135 - 145 mEq/L   Potassium 3.8  3.5 - 5.1 mEq/L   Chloride 102  96 - 112 mEq/L   CO2 28  19 - 32 mEq/L   Glucose, Bld 112 (*) 70 - 99 mg/dL   BUN 20  6 - 23 mg/dL   Creatinine, Ser 5.78  0.50 - 1.10 mg/dL   Calcium 9.4  8.4 - 46.9 mg/dL   Total Protein 7.1   6.0 - 8.3 g/dL   Albumin 3.5  3.5 - 5.2 g/dL   AST 23  0 - 37 U/L   ALT 22  0 - 35 U/L   Alkaline Phosphatase 62  39 - 117 U/L   Total Bilirubin 0.3  0.3 - 1.2 mg/dL   GFR calc non Af Amer 76 (*) >90 mL/min   GFR calc Af Amer 89 (*) >90 mL/min   Comment:            The eGFR has been calculated     using the CKD EPI equation.     This calculation has not been     validated in all clinical     situations.     eGFR's persistently     <90 mL/min signify     possible Chronic Kidney Disease.  POCT I-STAT TROPONIN I     Status: None   Collection Time    11/30/12  2:20 PM      Result Value Range   Troponin i, poc 0.00  0.00 - 0.08 ng/mL   Comment 3            Comment: Due to the release kinetics of cTnI,     a negative result within the first hours     of the onset of symptoms does not rule out     myocardial infarction with certainty.     If myocardial infarction is still suspected,     repeat the test at appropriate intervals.  POCT I-STAT TROPONIN I     Status: None   Collection Time    11/30/12  5:32 PM      Result Value Range   Troponin i, poc 0.00  0.00 - 0.08 ng/mL   Comment 3            Comment: Due to the release kinetics of cTnI,     a negative result within the first hours     of the onset of symptoms does not rule out     myocardial infarction with certainty.     If myocardial infarction is still suspected,     repeat the test at appropriate intervals.  TROPONIN I     Status: None   Collection Time    12/01/12  6:50 AM      Result Value Range   Troponin I <0.30  <0.30 ng/mL   Comment:            Due to the release kinetics of cTnI,     a negative result within the first hours     of the onset of symptoms  does not rule out     myocardial infarction with certainty.     If myocardial infarction is still suspected,     repeat the test at appropriate intervals.  BASIC METABOLIC PANEL     Status: Abnormal   Collection Time    12/01/12  6:50 AM      Result Value  Range   Sodium 144  135 - 145 mEq/L   Potassium 3.3 (*) 3.5 - 5.1 mEq/L   Chloride 103  96 - 112 mEq/L   CO2 31  19 - 32 mEq/L   Glucose, Bld 125 (*) 70 - 99 mg/dL   BUN 17  6 - 23 mg/dL   Creatinine, Ser 5.62  0.50 - 1.10 mg/dL   Calcium 9.7  8.4 - 13.0 mg/dL   GFR calc non Af Amer 76 (*) >90 mL/min   GFR calc Af Amer 88 (*) >90 mL/min   Comment:            The eGFR has been calculated     using the CKD EPI equation.     This calculation has not been     validated in all clinical     situations.     eGFR's persistently     <90 mL/min signify     possible Chronic Kidney Disease.  CBC     Status: None   Collection Time    12/01/12  6:50 AM      Result Value Range   WBC 5.6  4.0 - 10.5 K/uL   RBC 4.53  3.87 - 5.11 MIL/uL   Hemoglobin 12.0  12.0 - 15.0 g/dL   HCT 86.5  78.4 - 69.6 %   MCV 81.2  78.0 - 100.0 fL   MCH 26.5  26.0 - 34.0 pg   MCHC 32.6  30.0 - 36.0 g/dL   RDW 29.5  28.4 - 13.2 %   Platelets 195  150 - 400 K/uL    Disposition:  Follow-up Information   Follow up with Abelino Derrick, PA-C On 12/08/2012. (2:40pm)    Contact information:   [address] [place] [address] [phone]       Discharge Medications:    Medication List    TAKE these medications       albuterol 108 (90 BASE) MCG/ACT inhaler  Commonly known as:  PROVENTIL HFA;VENTOLIN HFA  Inhale 2 puffs into the lungs every 6 (six) hours as needed. For shortness of breath     aspirin EC 81 MG tablet  Take 81 mg by mouth daily.     azelastine 137 MCG/SPRAY nasal spray  Commonly known as:  ASTELIN  Place 1 spray into the nose 2 (two) times daily. Use in each nostril as directed     beclomethasone 80 MCG/ACT inhaler  Commonly known as:  QVAR  Inhale 2 puffs into the lungs 2 (two) times daily.     CALCIUM PO  Take 1 tablet by mouth daily.     clopidogrel 75 MG tablet  Commonly known as:  PLAVIX  Take 75 mg by mouth daily.     clorazepate 15 MG tablet  Commonly  known as:  TRANXENE  Take 15 mg by mouth 3 (three) times daily as needed. anxiety     dexlansoprazole 60 MG capsule  Commonly known as:  DEXILANT  Take 60 mg by mouth daily.     ezetimibe 10 MG tablet  Commonly known as:  ZETIA  Take 10 mg  by mouth at bedtime.     FLORASTOR 250 MG capsule  Generic drug:  saccharomyces boulardii  Take 250 mg by mouth 2 (two) times daily.     glimepiride 1 MG tablet  Commonly known as:  AMARYL  Take 1 mg by mouth daily before breakfast.     levocetirizine 5 MG tablet  Commonly known as:  XYZAL  Take 5 mg by mouth every morning.     levothyroxine 50 MCG tablet  Commonly known as:  SYNTHROID, LEVOTHROID  Take 25 mcg by mouth daily.     LORazepam 0.5 MG tablet  Commonly known as:  ATIVAN  Take 0.5 mg by mouth at bedtime.     metoCLOPramide 5 MG tablet  Commonly known as:  REGLAN  Take 5 mg by mouth 2 (two) times daily.     metoprolol succinate 100 MG 24 hr tablet  Commonly known as:  TOPROL-XL  Take 1 tablet (100 mg total) by mouth daily. Take 1 in am and 1.5 tablets in evenings     MICARDIS HCT 80-25 MG per tablet  Generic drug:  telmisartan-hydrochlorothiazide  Take 1 tablet by mouth daily.     montelukast 10 MG tablet  Commonly known as:  SINGULAIR  Take 10 mg by mouth daily.     multivitamin with minerals Tabs  Take 1 tablet by mouth daily.     nitroGLYCERIN 0.4 MG SL tablet  Commonly known as:  NITROSTAT  Place 0.4 mg under the tongue every 5 (five) minutes as needed. For chest pain     rosuvastatin 20 MG tablet  Commonly known as:  CRESTOR  Take 20 mg by mouth daily.     Vitamin D3 2000 UNITS capsule  Take 2,000 Units by mouth daily.     zolpidem 10 MG tablet  Commonly known as:  AMBIEN  Take 10 mg by mouth at bedtime as needed for sleep.         Duration of Discharge Encounter: Greater than 30 minutes including physician time.  Jolene Provost PA-C 12/01/2012 11:51 AM

## 2012-12-01 NOTE — Progress Notes (Signed)
Subjective:  No chest pain. Frail, weak.  Objective:  Vital Signs in the last 24 hours: Temp:  [98 F (36.7 C)-98.6 F (37 C)] 98.2 F (36.8 C) (04/13 2216) Pulse Rate:  [85-112] 89 (04/14 0923) Resp:  [15-20] 18 (04/13 2216) BP: (110-143)/(49-98) 140/66 mmHg (04/14 0923) SpO2:  [94 %-98 %] 97 % (04/13 2216) Weight:  [88.497 kg (195 lb 1.6 oz)] 88.497 kg (195 lb 1.6 oz) (04/13 2216)  Intake/Output from previous day:  Intake/Output Summary (Last 24 hours) at 12/01/12 1055 Last data filed at 12/01/12 0924  Gross per 24 hour  Intake    243 ml  Output      0 ml  Net    243 ml   . aspirin EC  81 mg Oral Daily  . aspirin EC  81 mg Oral Daily  . atorvastatin  40 mg Oral q1800  . cholecalciferol  2,000 Units Oral Daily  . clopidogrel  75 mg Oral Daily  . ezetimibe  10 mg Oral QHS  . fluticasone  1 puff Inhalation BID  . glimepiride  1 mg Oral QAC breakfast  . hydrochlorothiazide  25 mg Oral Daily  . irbesartan  300 mg Oral Daily  . levothyroxine  25 mcg Oral Daily  . loratadine  10 mg Oral Daily  . LORazepam  0.5 mg Oral QHS  . metoCLOPramide  5 mg Oral BID  . metoprolol succinate  100 mg Oral Daily  . montelukast  10 mg Oral Daily  . pantoprazole  40 mg Oral Daily  . saccharomyces boulardii  250 mg Oral BID  . sodium chloride  3 mL Intravenous Q12H    Physical Exam: General appearance: alert, cooperative, moderately obese and pale Lungs: clear to auscultation bilaterally Heart: regular rate and rhythm Abd: soft, BS+ No edema   Rate: 86  Rhythm: normal sinus rhythm and she went in and out of BBB on telemetry.   Lab Results:  Recent Labs  11/30/12 1406 12/01/12 0650  WBC 6.6 5.6  HGB 11.9* 12.0  PLT 209 195    Recent Labs  11/30/12 1406 12/01/12 0650  NA 140 144  K 3.8 3.3*  CL 102 103  CO2 28 31  GLUCOSE 112* 125*  BUN 20 17  CREATININE 0.71 0.73  BNP (last 3 results) No results found for this basename: PROBNP,  in the last 8760 hours  Recent  Labs  12/01/12 0650  TROPONINI <0.30   Hepatic Function Panel  Recent Labs  11/30/12 1406  PROT 7.1  ALBUMIN 3.5  AST 23  ALT 22  ALKPHOS 62  BILITOT 0.3   No results found for this basename: CHOL,  in the last 72 hours No results found for this basename: INR,  in the last 72 hours  Imaging: Imaging results have been reviewed  Cardiac Studies:  Assessment/Plan:   Principal Problem:   PAF - with RVR, converted sponateously Active Problems:   Chest pain   DIABETES MELLITUS   CAD- HSRA LAD 2000, low risk Myoview 11/13   Asthma with bronchitis   HTN (hypertension)   HLD (hyperlipidemia)   Hypothyroid   Nonspecific abnormal electrocardiogram (ECG) - intermittent BBB  Plan- Check TSH. Replace K+. Ambulate, possibly home later today.    Corine Shelter PA-C 12/01/2012, 10:55 AM    Patient seen and examined. Agree with assessment and plan. Feels better. ECG normal without conduction abnormality. Now on Toprol  XL 100 mg in am in stead of previous bid regimen  of 50/75.  For DC today with extender f/u in 1-2 weeks and f/u with me in several weeks.   Lennette Bihari, MD, [facility] 12/01/2012 11:00 AM

## 2012-12-01 NOTE — Progress Notes (Signed)
Pt discharged to home per MD order. Pt and son received and reviewed all discharge instructions and medication information including follow-up appointments and prescriptions. Pt verbalized understanding. Pt alert and oriented at discharge with no complaints of pain. Pt escorted to private vehicle via wheelchair by guest services. Efraim Kaufmann

## 2012-12-08 DIAGNOSIS — R079 Chest pain, unspecified: Secondary | ICD-10-CM | POA: Diagnosis not present

## 2012-12-08 DIAGNOSIS — E782 Mixed hyperlipidemia: Secondary | ICD-10-CM | POA: Diagnosis not present

## 2012-12-08 DIAGNOSIS — I2510 Atherosclerotic heart disease of native coronary artery without angina pectoris: Secondary | ICD-10-CM | POA: Diagnosis not present

## 2012-12-08 DIAGNOSIS — I10 Essential (primary) hypertension: Secondary | ICD-10-CM | POA: Diagnosis not present

## 2012-12-09 DIAGNOSIS — K219 Gastro-esophageal reflux disease without esophagitis: Secondary | ICD-10-CM | POA: Diagnosis not present

## 2012-12-09 DIAGNOSIS — F411 Generalized anxiety disorder: Secondary | ICD-10-CM | POA: Diagnosis not present

## 2012-12-12 ENCOUNTER — Other Ambulatory Visit: Payer: Self-pay | Admitting: Gastroenterology

## 2012-12-12 DIAGNOSIS — K3189 Other diseases of stomach and duodenum: Secondary | ICD-10-CM | POA: Diagnosis not present

## 2012-12-12 DIAGNOSIS — R1013 Epigastric pain: Secondary | ICD-10-CM | POA: Diagnosis not present

## 2012-12-12 DIAGNOSIS — D131 Benign neoplasm of stomach: Secondary | ICD-10-CM | POA: Diagnosis not present

## 2012-12-15 NOTE — ED Provider Notes (Addendum)
History     CSN: [id number]  Arrival date & time 11/30/12  1347   First MD Initiated Contact with Patient 11/30/12 [phone]      Chief Complaint  Patient presents with  . Chest Pain    (Consider location/radiation/quality/duration/timing/severity/associated sxs/prior treatment) Patient is a 79 y.o. female presenting with chest pain. The history is provided by the patient and the EMS personnel.  Chest Pain Associated symptoms: no abdominal pain, no back pain, no fever, no headache, no nausea, no shortness of breath and not vomiting    patient brought in by EMS. Primary care doctor is Dr. Selena Batten cardiologist [facility] heart and vascular Dr. Tresa Endo. Patient with catheterization of the heart in the past question around 2000. Stress test in the fall of 2013. Patient with onset of chest pain at about 10:00 this morning. Patient took 2 of her own nitroglycerin with no change. EMS was called they gave aspirin 324 mg into more sublingual nitroglycerin decreasing the pain from a 8/10-6/10. Chest pain was left-sided chest nonradiating described as sharp and an ache. Not associated with shortness of breath nausea vomiting.  Past Medical History  Diagnosis Date  . Asthma   . Coronary artery disease   . Diabetes mellitus without complication   . Hypothyroidism   . GERD (gastroesophageal reflux disease)   . Insomnia   . Anxiety   . Hypertension   . CAD (coronary artery disease)   . Osteopenia   . Osteoarthritis     Past Surgical History  Procedure Laterality Date  . Appendectomy  1947  . Dilation and curettage of uterus  1960  . Vesicovaginal fistula closure w/ tah  1971    Dr Dewaine Conger  . Bladder tack  1970    Dr Earlene Plater  . Breast lumpectomy  1970    Left, Dr Barbera Setters  . Nasal sinus surgery  1980    Left; for chronic sinusitis  . Revision total knee arthroplasty  2005    Dr Charlann Boxer Right knee    Family History  Problem Relation Age of Onset  . Diabetes Father   . Parkinson's disease Father    . Heart disease Father   . Asthma Father   . Diabetes Mother   . Heart disease Mother   . Heart disease Brother   . Diabetes Brother   . Diabetes Brother   . Diabetes Brother     History  Substance Use Topics  . Smoking status: Never Smoker   . Smokeless tobacco: Never Used  . Alcohol Use: No    OB History   Grav Para Term Preterm Abortions TAB SAB Ect Mult Living                  Review of Systems  Constitutional: Negative for fever.  HENT: Negative for neck pain.   Eyes: Negative for redness and visual disturbance.  Respiratory: Negative for shortness of breath.   Cardiovascular: Positive for chest pain.  Gastrointestinal: Negative for nausea, vomiting and abdominal pain.  Genitourinary: Negative for dysuria.  Musculoskeletal: Negative for back pain.  Skin: Negative for rash.  Neurological: Negative for syncope and headaches.  Hematological: Does not bruise/bleed easily.  Psychiatric/Behavioral: Negative for confusion.    Allergies  Review of patient's allergies indicates no known allergies.  Home Medications   Current Outpatient Rx  Name  Route  Sig  Dispense  Refill  . albuterol (PROVENTIL HFA;VENTOLIN HFA) 108 (90 BASE) MCG/ACT inhaler   Inhalation   Inhale 2 puffs into  the lungs every 6 (six) hours as needed. For shortness of breath         . aspirin EC 81 MG tablet   Oral   Take 81 mg by mouth daily.         Marland Kitchen azelastine (ASTELIN) 137 MCG/SPRAY nasal spray   Nasal   Place 1 spray into the nose 2 (two) times daily. Use in each nostril as directed         . CALCIUM PO   Oral   Take 1 tablet by mouth daily.         . Cholecalciferol (VITAMIN D3) 2000 UNITS capsule   Oral   Take 2,000 Units by mouth daily.         . clopidogrel (PLAVIX) 75 MG tablet   Oral   Take 75 mg by mouth daily.         Marland Kitchen dexlansoprazole (DEXILANT) 60 MG capsule   Oral   Take 60 mg by mouth daily.         Marland Kitchen ezetimibe (ZETIA) 10 MG tablet   Oral   Take  10 mg by mouth at bedtime.         Marland Kitchen glimepiride (AMARYL) 1 MG tablet   Oral   Take 1 mg by mouth daily before breakfast.         . levocetirizine (XYZAL) 5 MG tablet   Oral   Take 5 mg by mouth every morning.         Marland Kitchen levothyroxine (SYNTHROID, LEVOTHROID) 50 MCG tablet   Oral   Take 25 mcg by mouth daily.          Marland Kitchen LORazepam (ATIVAN) 0.5 MG tablet   Oral   Take 0.5 mg by mouth at bedtime.         . metoCLOPramide (REGLAN) 5 MG tablet   Oral   Take 5 mg by mouth 2 (two) times daily.         Marland Kitchen MICARDIS HCT 80-25 MG per tablet   Oral   Take 1 tablet by mouth daily.         . montelukast (SINGULAIR) 10 MG tablet   Oral   Take 10 mg by mouth daily.         . Multiple Vitamin (MULTIVITAMIN WITH MINERALS) TABS   Oral   Take 1 tablet by mouth daily.         . nitroGLYCERIN (NITROSTAT) 0.4 MG SL tablet   Sublingual   Place 0.4 mg under the tongue every 5 (five) minutes as needed. For chest pain         . rosuvastatin (CRESTOR) 20 MG tablet   Oral   Take 20 mg by mouth daily.         Marland Kitchen saccharomyces boulardii (FLORASTOR) 250 MG capsule   Oral   Take 250 mg by mouth 2 (two) times daily.         Marland Kitchen zolpidem (AMBIEN) 10 MG tablet   Oral   Take 10 mg by mouth at bedtime as needed for sleep.         . beclomethasone (QVAR) 80 MCG/ACT inhaler   Inhalation   Inhale 2 puffs into the lungs 2 (two) times daily.   1 Inhaler   prn   . clorazepate (TRANXENE) 15 MG tablet   Oral   Take 15 mg by mouth 3 (three) times daily as needed. anxiety         . metoprolol succinate (  TOPROL-XL) 100 MG 24 hr tablet      Take 1 in am           BP 140/66  Pulse 89  Temp(Src) 98.2 F (36.8 C) (Oral)  Resp 18  Ht 5\' 1"  (1.549 m)  Wt 195 lb 1.6 oz (88.497 kg)  BMI 36.88 kg/m2  SpO2 97%  Physical Exam  Nursing note and vitals reviewed. Constitutional: She is oriented to person, place, and time. She appears well-developed and well-nourished. No  distress.  HENT:  Head: Normocephalic and atraumatic.  Mouth/Throat: Oropharynx is clear and moist.  Eyes: Conjunctivae and EOM are normal. Pupils are equal, round, and reactive to light.  Neck: Normal range of motion.  Cardiovascular: Normal rate and regular rhythm.   No murmur heard. Pulmonary/Chest: Effort normal and breath sounds normal. No respiratory distress.  Abdominal: Soft. Bowel sounds are normal. There is no tenderness.  Musculoskeletal: Normal range of motion. She exhibits no edema.  Neurological: She is alert and oriented to person, place, and time. No cranial nerve deficit. She exhibits normal muscle tone. Coordination normal.  Skin: Skin is warm. No rash noted.    ED Course  Procedures (including critical care time)  Labs Reviewed  CBC - Abnormal; Notable for the following:    Hemoglobin 11.9 (*)    All other components within normal limits  COMPREHENSIVE METABOLIC PANEL - Abnormal; Notable for the following:    Glucose, Bld 112 (*)    GFR calc non Af Amer 76 (*)    GFR calc Af Amer 89 (*)    All other components within normal limits  BASIC METABOLIC PANEL - Abnormal; Notable for the following:    Potassium 3.3 (*)    Glucose, Bld 125 (*)    GFR calc non Af Amer 76 (*)    GFR calc Af Amer 88 (*)    All other components within normal limits  TROPONIN I  CBC  TSH  POCT I-STAT TROPONIN I  POCT I-STAT TROPONIN I   No results found. Results for orders placed during the hospital encounter of 11/30/12  CBC      Result Value Range   WBC 6.6  4.0 - 10.5 K/uL   RBC 4.42  3.87 - 5.11 MIL/uL   Hemoglobin 11.9 (*) 12.0 - 15.0 g/dL   HCT 16.1  09.6 - 04.5 %   MCV 81.7  78.0 - 100.0 fL   MCH 26.9  26.0 - 34.0 pg   MCHC 33.0  30.0 - 36.0 g/dL   RDW 40.9  81.1 - 91.4 %   Platelets 209  150 - 400 K/uL  COMPREHENSIVE METABOLIC PANEL      Result Value Range   Sodium 140  135 - 145 mEq/L   Potassium 3.8  3.5 - 5.1 mEq/L   Chloride 102  96 - 112 mEq/L   CO2 28  19 -  32 mEq/L   Glucose, Bld 112 (*) 70 - 99 mg/dL   BUN 20  6 - 23 mg/dL   Creatinine, Ser 7.82  0.50 - 1.10 mg/dL   Calcium 9.4  8.4 - 95.6 mg/dL   Total Protein 7.1  6.0 - 8.3 g/dL   Albumin 3.5  3.5 - 5.2 g/dL   AST 23  0 - 37 U/L   ALT 22  0 - 35 U/L   Alkaline Phosphatase 62  39 - 117 U/L   Total Bilirubin 0.3  0.3 - 1.2 mg/dL   GFR calc  non Af Amer 76 (*) >90 mL/min   GFR calc Af Amer 89 (*) >90 mL/min  TROPONIN I      Result Value Range   Troponin I <0.30  <0.30 ng/mL  BASIC METABOLIC PANEL      Result Value Range   Sodium 144  135 - 145 mEq/L   Potassium 3.3 (*) 3.5 - 5.1 mEq/L   Chloride 103  96 - 112 mEq/L   CO2 31  19 - 32 mEq/L   Glucose, Bld 125 (*) 70 - 99 mg/dL   BUN 17  6 - 23 mg/dL   Creatinine, Ser 1.61  0.50 - 1.10 mg/dL   Calcium 9.7  8.4 - 09.6 mg/dL   GFR calc non Af Amer 76 (*) >90 mL/min   GFR calc Af Amer 88 (*) >90 mL/min  CBC      Result Value Range   WBC 5.6  4.0 - 10.5 K/uL   RBC 4.53  3.87 - 5.11 MIL/uL   Hemoglobin 12.0  12.0 - 15.0 g/dL   HCT 04.5  40.9 - 81.1 %   MCV 81.2  78.0 - 100.0 fL   MCH 26.5  26.0 - 34.0 pg   MCHC 32.6  30.0 - 36.0 g/dL   RDW 91.4  78.2 - 95.6 %   Platelets 195  150 - 400 K/uL  TSH      Result Value Range   TSH 0.400  0.350 - 4.500 uIU/mL  POCT I-STAT TROPONIN I      Result Value Range   Troponin i, poc 0.00  0.00 - 0.08 ng/mL   Comment 3           POCT I-STAT TROPONIN I      Result Value Range   Troponin i, poc 0.00  0.00 - 0.08 ng/mL   Comment 3            Results for orders placed during the hospital encounter of 11/30/12  CBC      Result Value Range   WBC 6.6  4.0 - 10.5 K/uL   RBC 4.42  3.87 - 5.11 MIL/uL   Hemoglobin 11.9 (*) 12.0 - 15.0 g/dL   HCT 21.3  08.6 - 57.8 %   MCV 81.7  78.0 - 100.0 fL   MCH 26.9  26.0 - 34.0 pg   MCHC 33.0  30.0 - 36.0 g/dL   RDW 46.9  62.9 - 52.8 %   Platelets 209  150 - 400 K/uL  COMPREHENSIVE METABOLIC PANEL      Result Value Range   Sodium 140  135 - 145 mEq/L    Potassium 3.8  3.5 - 5.1 mEq/L   Chloride 102  96 - 112 mEq/L   CO2 28  19 - 32 mEq/L   Glucose, Bld 112 (*) 70 - 99 mg/dL   BUN 20  6 - 23 mg/dL   Creatinine, Ser 4.13  0.50 - 1.10 mg/dL   Calcium 9.4  8.4 - 24.4 mg/dL   Total Protein 7.1  6.0 - 8.3 g/dL   Albumin 3.5  3.5 - 5.2 g/dL   AST 23  0 - 37 U/L   ALT 22  0 - 35 U/L   Alkaline Phosphatase 62  39 - 117 U/L   Total Bilirubin 0.3  0.3 - 1.2 mg/dL   GFR calc non Af Amer 76 (*) >90 mL/min   GFR calc Af Amer 89 (*) >90 mL/min  TROPONIN I  Result Value Range   Troponin I <0.30  <0.30 ng/mL  BASIC METABOLIC PANEL      Result Value Range   Sodium 144  135 - 145 mEq/L   Potassium 3.3 (*) 3.5 - 5.1 mEq/L   Chloride 103  96 - 112 mEq/L   CO2 31  19 - 32 mEq/L   Glucose, Bld 125 (*) 70 - 99 mg/dL   BUN 17  6 - 23 mg/dL   Creatinine, Ser 4.09  0.50 - 1.10 mg/dL   Calcium 9.7  8.4 - 81.1 mg/dL   GFR calc non Af Amer 76 (*) >90 mL/min   GFR calc Af Amer 88 (*) >90 mL/min  CBC      Result Value Range   WBC 5.6  4.0 - 10.5 K/uL   RBC 4.53  3.87 - 5.11 MIL/uL   Hemoglobin 12.0  12.0 - 15.0 g/dL   HCT 91.4  78.2 - 95.6 %   MCV 81.2  78.0 - 100.0 fL   MCH 26.5  26.0 - 34.0 pg   MCHC 32.6  30.0 - 36.0 g/dL   RDW 21.3  08.6 - 57.8 %   Platelets 195  150 - 400 K/uL  TSH      Result Value Range   TSH 0.400  0.350 - 4.500 uIU/mL  POCT I-STAT TROPONIN I      Result Value Range   Troponin i, poc 0.00  0.00 - 0.08 ng/mL   Comment 3           POCT I-STAT TROPONIN I      Result Value Range   Troponin i, poc 0.00  0.00 - 0.08 ng/mL   Comment 3            Dg Chest 2 View  11/30/2012  *RADIOLOGY REPORT*  Clinical Data: Chest pain  CHEST - 2 VIEW  Comparison: 10/10/2012.  Findings: Lungs are essentially clear.  No focal consolidation.  No pleural effusion or pneumothorax.  The heart is top normal in size.  Mild degenerative changes of the visualized thoracolumbar spine.  IMPRESSION: No evidence of acute cardiopulmonary disease.    Original Report Authenticated By: Charline Bills, M.D.      Date: 12/15/2012  Rate: 111  Rhythm: sinus tachycardia  QRS Axis: normal  Intervals: normal  ST/T Wave abnormalities: nonspecific ST/T changes  Conduction Disutrbances:nonspecific intraventricular conduction delay  Narrative Interpretation:   Old EKG Reviewed: changes noted His EKG raises suspicion for atrial flutter but I do think is probably sinus with a wide QRS complex. Compared to her EKG from 07/19/2012 chest narrow complex sinus rhythm at that time.   Date: 12/15/2012  Rate: 89  Rhythm: normal sinus rhythm  QRS Axis: normal  Intervals: normal  ST/T Wave abnormalities: normal  Conduction Disutrbances:none  Narrative Interpretation:   Old EKG Reviewed: unchanged Repeat EKG approximately 2 hours later shows a sinus rhythm with no significant changes compared to her EKG in November.    1. Chest pain       MDM  Patient will require admission for the chest pain. Patient onset of chest pain today a out of 10 no shortness of breath associated with it took 2 nitroglycerin glycerin tablets called EMS additional 2 nitroglycerin given by EMS also given 324 mg of aspirin. Patient's pain improved from an 8/10-6/10. Workup in the emergency department without any acute abnormalities initial EKG was somewhat confusing with a wide complex tachycardia with heart rate was only in the  low 100s troponins x2 were negative chest x-ray was negative for pneumonia pneumothorax or pulmonary edema. Patient has no leukocytosis no renal insufficiency. Patient's troponins negative x2. Discussed with cardiology and internal medicine. Patient will require admission  they will admit.        Shelda Jakes, MD 12/15/12 1024    Shelda Jakes, MD 12/15/12 1024

## 2012-12-22 DIAGNOSIS — E1139 Type 2 diabetes mellitus with other diabetic ophthalmic complication: Secondary | ICD-10-CM | POA: Diagnosis not present

## 2012-12-23 DIAGNOSIS — L608 Other nail disorders: Secondary | ICD-10-CM | POA: Diagnosis not present

## 2012-12-23 DIAGNOSIS — E1159 Type 2 diabetes mellitus with other circulatory complications: Secondary | ICD-10-CM | POA: Diagnosis not present

## 2012-12-23 DIAGNOSIS — L84 Corns and callosities: Secondary | ICD-10-CM | POA: Diagnosis not present

## 2012-12-23 DIAGNOSIS — I739 Peripheral vascular disease, unspecified: Secondary | ICD-10-CM | POA: Diagnosis not present

## 2012-12-29 DIAGNOSIS — J018 Other acute sinusitis: Secondary | ICD-10-CM | POA: Diagnosis not present

## 2012-12-29 DIAGNOSIS — R51 Headache: Secondary | ICD-10-CM | POA: Diagnosis not present

## 2012-12-29 DIAGNOSIS — R110 Nausea: Secondary | ICD-10-CM | POA: Diagnosis not present

## 2012-12-29 DIAGNOSIS — I2510 Atherosclerotic heart disease of native coronary artery without angina pectoris: Secondary | ICD-10-CM | POA: Diagnosis not present

## 2013-01-07 ENCOUNTER — Ambulatory Visit: Payer: Medicare Other | Admitting: Internal Medicine

## 2013-01-08 DIAGNOSIS — E7800 Pure hypercholesterolemia, unspecified: Secondary | ICD-10-CM | POA: Diagnosis not present

## 2013-01-08 DIAGNOSIS — I10 Essential (primary) hypertension: Secondary | ICD-10-CM | POA: Diagnosis not present

## 2013-01-08 DIAGNOSIS — J329 Chronic sinusitis, unspecified: Secondary | ICD-10-CM | POA: Diagnosis not present

## 2013-01-08 DIAGNOSIS — J309 Allergic rhinitis, unspecified: Secondary | ICD-10-CM | POA: Diagnosis not present

## 2013-01-09 ENCOUNTER — Other Ambulatory Visit: Payer: Self-pay | Admitting: Internal Medicine

## 2013-01-09 DIAGNOSIS — J329 Chronic sinusitis, unspecified: Secondary | ICD-10-CM

## 2013-01-17 ENCOUNTER — Encounter: Payer: Self-pay | Admitting: *Deleted

## 2013-01-19 ENCOUNTER — Ambulatory Visit (INDEPENDENT_AMBULATORY_CARE_PROVIDER_SITE_OTHER): Payer: Medicare Other | Admitting: Cardiovascular Disease

## 2013-01-19 ENCOUNTER — Encounter: Payer: Self-pay | Admitting: Cardiovascular Disease

## 2013-01-19 VITALS — BP 130/70 | HR 81 | Ht 61.0 in | Wt 189.0 lb

## 2013-01-19 DIAGNOSIS — R109 Unspecified abdominal pain: Secondary | ICD-10-CM | POA: Insufficient documentation

## 2013-01-19 DIAGNOSIS — I10 Essential (primary) hypertension: Secondary | ICD-10-CM | POA: Diagnosis not present

## 2013-01-19 DIAGNOSIS — R079 Chest pain, unspecified: Secondary | ICD-10-CM

## 2013-01-19 DIAGNOSIS — R9431 Abnormal electrocardiogram [ECG] [EKG]: Secondary | ICD-10-CM | POA: Diagnosis not present

## 2013-01-19 DIAGNOSIS — F411 Generalized anxiety disorder: Secondary | ICD-10-CM

## 2013-01-19 DIAGNOSIS — I4891 Unspecified atrial fibrillation: Secondary | ICD-10-CM | POA: Diagnosis not present

## 2013-01-19 DIAGNOSIS — I480 Paroxysmal atrial fibrillation: Secondary | ICD-10-CM

## 2013-01-19 NOTE — Progress Notes (Signed)
Patient ID: [name], female   DOB: 10-02-1924, 79 y.o.   MRN: [id number]    HPI: [name], is a 79 y.o. female who has known coronary artery disease and presents to the office today for followup evaluation. He every 2000, I performed high-speed rotational at directly of a calcified LAD in the region of the very large diagonal vessel. She had excellent angiographic result. She been cared for by Dr. Clarene Duke in the past. She has a history of hypothyroidism, hyperlipidemia, asthma, as well as hypertension. She apparently was recently hospitalized for lower chest pain and had transient ECG changes with transient left bundle branch block, right bundle branch block. Her beta blocker dose was reduced. She was felt most likely to have GI symptoms. An upper GI did suggest the possibility of a hiatal hernia. She did see Dr. Jeannetta Ellis and apparently underwent endoscopy and she was told of having some small polyps. Remotely she had been on Paxil and most recently has been on Prilosec. She does admit to being anxious in her abdomen currently she was also given Ativan by Dr. Selena Batten. She saw leucovorin in followup of her recent hospitalization in April 2014. She now presents for subsequent cardiology evaluation.  Past Medical History  Diagnosis Date  . Asthma   . Coronary artery disease   . Diabetes mellitus without complication   . Hypothyroidism   . GERD (gastroesophageal reflux disease)   . Insomnia   . Anxiety   . Hypertension   . CAD (coronary artery disease)   . Osteopenia   . Osteoarthritis   . H/O echocardiogram 06-26-2012    EF 55%  . H/O cardiovascular stress test 06-26-2012    compared to previous study there is no significant change; normal myocardial perfusion study. no significant ischemia demonstrated . this is a low risk scan  . History of cardiac cath 09-30-1998    cath done by Dr. Tresa Endo, successful rotation atherectomy of the left anterior decending artert with reduction from 80 to105  .  Hx of cardiac cath 09-26-1998    dignostic cath dr. Tresa Endo to do rotoblator at a later date  . History of Doppler ultrasound 03-30-2008    neg abd. doppler for aneurysm  . History of Holter monitoring 12-22-2007    NSR with pac occs. pvc's    Past Surgical History  Procedure Laterality Date  . Appendectomy  1947  . Dilation and curettage of uterus  1960  . Vesicovaginal fistula closure w/ tah  1971    Dr Dewaine Conger  . Bladder tack  1970    Dr Earlene Plater  . Breast lumpectomy  1970    Left, Dr Barbera Setters  . Nasal sinus surgery  1980    Left; for chronic sinusitis  . Revision total knee arthroplasty  2005    Dr Charlann Boxer Right knee    No Known Allergies  Current Outpatient Prescriptions  Medication Sig Dispense Refill  . albuterol (PROVENTIL HFA;VENTOLIN HFA) 108 (90 BASE) MCG/ACT inhaler Inhale 2 puffs into the lungs every 6 (six) hours as needed. For shortness of breath      . aspirin EC 81 MG tablet Take 81 mg by mouth daily.      Marland Kitchen azelastine (ASTELIN) 137 MCG/SPRAY nasal spray Place 1 spray into the nose 2 (two) times daily. Use in each nostril as directed      . beclomethasone (QVAR) 80 MCG/ACT inhaler Inhale 2 puffs into the lungs 2 (two) times daily.  1 Inhaler  prn  .  CALCIUM PO Take 1 tablet by mouth daily.      . Cholecalciferol (VITAMIN D3) 2000 UNITS capsule Take 2,000 Units by mouth daily.      . clopidogrel (PLAVIX) 75 MG tablet Take 75 mg by mouth daily.      . clorazepate (TRANXENE) 15 MG tablet Take 15 mg by mouth 3 (three) times daily as needed. anxiety      . ezetimibe (ZETIA) 10 MG tablet Take 10 mg by mouth at bedtime.      Marland Kitchen glimepiride (AMARYL) 1 MG tablet Take 1 mg by mouth daily before breakfast.      . levocetirizine (XYZAL) 5 MG tablet Take 5 mg by mouth every morning.      Marland Kitchen levothyroxine (SYNTHROID, LEVOTHROID) 50 MCG tablet Take 25 mcg by mouth daily.       Marland Kitchen LORazepam (ATIVAN) 0.5 MG tablet Take 0.5 mg by mouth at bedtime.      . metoCLOPramide (REGLAN) 5 MG tablet Take 5  mg by mouth 2 (two) times daily.      . metoprolol succinate (TOPROL-XL) 50 MG 24 hr tablet Take 100 mg by mouth daily. Take with or immediately following a meal.      . MICARDIS HCT 80-25 MG per tablet Take 1 tablet by mouth daily.      . nitroGLYCERIN (NITROSTAT) 0.4 MG SL tablet Place 0.4 mg under the tongue every 5 (five) minutes as needed. For chest pain      . rosuvastatin (CRESTOR) 20 MG tablet Take 20 mg by mouth daily.      Marland Kitchen saccharomyces boulardii (FLORASTOR) 250 MG capsule Take 250 mg by mouth 2 (two) times daily.      Marland Kitchen zolpidem (AMBIEN) 10 MG tablet Take 10 mg by mouth at bedtime as needed for sleep.       No current facility-administered medications for this visit.    Socially she is widowed with one child one grandchild. She with her son. No tobacco history.  ROS is notable for being anxious and nervous. She denies anginal type symptoms. She denies wheezing. She denies recurrent palpitations. She denies presyncope or syncope. She denies change in bowel or bladder habits. She denies significant leg swelling. Other system review is negative. PE BP 130/70  Pulse 81  Ht 5\' 1"  (1.549 m)  Wt 189 lb (85.73 kg)  BMI 35.73 kg/m2  General: Alert, oriented, no distress.  HEENT: Normocephalic, atraumatic. Pupils round and reactive; sclera anicteric;  Nose without nasal septal hypertrophy Mouth/Parynx benign; Mallinpatti scale 3 Neck: No JVD, no carotid briuts Lungs: clear to ausculatation and percussion; no wheezing or rales Heart: RRR, s1 s2 normal 1/6 systolic murmur appeared Abdomen: soft, nontender; no hepatosplenomehaly, BS+; abdominal aorta nontender and not dilated by palpation. Pulses 2+ Extremities: no clubbinbg cyanosis or edema, Homan's sign negative  Neurologic: grossly nonfocal  ECG: Normal sinus rhythm with nonspecific ST-T changes. QT interval is slightly prolonged at 490 ms.  LABS:  BMET    Component Value Date/Time   NA 144 12/01/2012 0650   K 3.3*  12/01/2012 0650   CL 103 12/01/2012 0650   CO2 31 12/01/2012 0650   GLUCOSE 125* 12/01/2012 0650   BUN 17 12/01/2012 0650   CREATININE 0.73 12/01/2012 0650   CALCIUM 9.7 12/01/2012 0650   GFRNONAA 76* 12/01/2012 0650   GFRAA 88* 12/01/2012 0650     Hepatic Function Panel     Component Value Date/Time   PROT 7.1 11/30/2012 1406  ALBUMIN 3.5 11/30/2012 1406   AST 23 11/30/2012 1406   ALT 22 11/30/2012 1406   ALKPHOS 62 11/30/2012 1406   BILITOT 0.3 11/30/2012 1406     CBC    Component Value Date/Time   WBC 5.6 12/01/2012 0650   RBC 4.53 12/01/2012 0650   HGB 12.0 12/01/2012 0650   HCT 36.8 12/01/2012 0650   PLT 195 12/01/2012 0650   MCV 81.2 12/01/2012 0650   MCH 26.5 12/01/2012 0650   MCHC 32.6 12/01/2012 0650   RDW 14.1 12/01/2012 0650   LYMPHSABS 2.1 07/16/2010 1508   MONOABS 0.8 07/16/2010 1508   EOSABS 0.3 07/16/2010 1508   BASOSABS 0.0 07/16/2010 1508     BNP    Component Value Date/Time   PROBNP 47.0 07/16/2010 1508    Lipid Panel  No results found for this basename: chol, trig, hdl, cholhdl, vldl, ldlcalc     RADIOLOGY: No results found.    ASSESSMENT AND PLAN: From a cardiac standpoint, Ms. Reeves Forth seems to be doing well. She did have an echo Doppler study November 2013 which showed an ejection fraction greater than 55% normal systolic function with grade 1 diastolic dysfunction. She did have moderate aortic sclerosis without stenosis. There was mild mitral regurgitation and tricuspid regurgitation. A nuclear perfusion study November 2013 continued to show normal perfusion without scar or ischemia. Post-rest ejection fraction was 73%. This allows recent hospitalization overnight as likely was not cardiac in etiology. She did undergo GI endoscopy procedure by Dr. Lamount Cranker. I did suggest that she may try the methadone to see if this helps with potential abdominal bloating and gas. She will followup with Dr. Selena Batten. From a heart perspective perspective she is stable. I will see  her in 6 months for reassessment.     Lennette Bihari, MD, [facility]  01/19/2013 3:18 PM

## 2013-01-19 NOTE — Patient Instructions (Signed)
Your physician recommends that you schedule a follow-up appointment in: 6 months  

## 2013-01-20 DIAGNOSIS — Z1231 Encounter for screening mammogram for malignant neoplasm of breast: Secondary | ICD-10-CM | POA: Diagnosis not present

## 2013-01-21 ENCOUNTER — Other Ambulatory Visit: Payer: 59

## 2013-01-26 DIAGNOSIS — E039 Hypothyroidism, unspecified: Secondary | ICD-10-CM | POA: Diagnosis not present

## 2013-01-26 DIAGNOSIS — M810 Age-related osteoporosis without current pathological fracture: Secondary | ICD-10-CM | POA: Diagnosis not present

## 2013-01-26 DIAGNOSIS — E119 Type 2 diabetes mellitus without complications: Secondary | ICD-10-CM | POA: Diagnosis not present

## 2013-01-30 DIAGNOSIS — D37039 Neoplasm of uncertain behavior of the major salivary glands, unspecified: Secondary | ICD-10-CM | POA: Diagnosis not present

## 2013-01-30 DIAGNOSIS — R51 Headache: Secondary | ICD-10-CM | POA: Diagnosis not present

## 2013-02-02 DIAGNOSIS — R51 Headache: Secondary | ICD-10-CM | POA: Diagnosis not present

## 2013-02-02 DIAGNOSIS — R110 Nausea: Secondary | ICD-10-CM | POA: Diagnosis not present

## 2013-02-24 DIAGNOSIS — G4700 Insomnia, unspecified: Secondary | ICD-10-CM | POA: Diagnosis not present

## 2013-02-24 DIAGNOSIS — R110 Nausea: Secondary | ICD-10-CM | POA: Diagnosis not present

## 2013-02-24 DIAGNOSIS — F411 Generalized anxiety disorder: Secondary | ICD-10-CM | POA: Diagnosis not present

## 2013-02-24 DIAGNOSIS — R109 Unspecified abdominal pain: Secondary | ICD-10-CM | POA: Diagnosis not present

## 2013-02-25 ENCOUNTER — Other Ambulatory Visit: Payer: Self-pay | Admitting: Internal Medicine

## 2013-02-25 DIAGNOSIS — R109 Unspecified abdominal pain: Secondary | ICD-10-CM

## 2013-02-25 DIAGNOSIS — I10 Essential (primary) hypertension: Secondary | ICD-10-CM

## 2013-02-25 DIAGNOSIS — I771 Stricture of artery: Secondary | ICD-10-CM

## 2013-02-25 DIAGNOSIS — K551 Chronic vascular disorders of intestine: Secondary | ICD-10-CM

## 2013-02-26 DIAGNOSIS — I10 Essential (primary) hypertension: Secondary | ICD-10-CM | POA: Diagnosis not present

## 2013-03-02 ENCOUNTER — Ambulatory Visit: Payer: Medicare Other | Admitting: Internal Medicine

## 2013-03-04 ENCOUNTER — Ambulatory Visit
Admission: RE | Admit: 2013-03-04 | Discharge: 2013-03-04 | Disposition: A | Discharge status: Home / Self Care | Payer: Medicare Other | Source: Ambulatory Visit | Attending: Internal Medicine | Admitting: Internal Medicine

## 2013-03-04 DIAGNOSIS — I10 Essential (primary) hypertension: Secondary | ICD-10-CM

## 2013-03-04 DIAGNOSIS — I700 Atherosclerosis of aorta: Secondary | ICD-10-CM | POA: Diagnosis not present

## 2013-03-04 MED ORDER — GADOBENATE DIMEGLUMINE 529 MG/ML IV SOLN
8.0000 mL | Freq: Once | INTRAVENOUS | Status: AC | PRN
  Administered 2013-03-04: 8 mL via INTRAVENOUS

## 2013-03-11 ENCOUNTER — Other Ambulatory Visit: Payer: Self-pay | Admitting: Gastroenterology

## 2013-03-11 DIAGNOSIS — R110 Nausea: Secondary | ICD-10-CM

## 2013-03-11 DIAGNOSIS — R634 Abnormal weight loss: Secondary | ICD-10-CM | POA: Diagnosis not present

## 2013-03-12 DIAGNOSIS — L608 Other nail disorders: Secondary | ICD-10-CM | POA: Diagnosis not present

## 2013-03-12 DIAGNOSIS — I739 Peripheral vascular disease, unspecified: Secondary | ICD-10-CM | POA: Diagnosis not present

## 2013-03-12 DIAGNOSIS — E1159 Type 2 diabetes mellitus with other circulatory complications: Secondary | ICD-10-CM | POA: Diagnosis not present

## 2013-03-12 DIAGNOSIS — L84 Corns and callosities: Secondary | ICD-10-CM | POA: Diagnosis not present

## 2013-03-13 ENCOUNTER — Ambulatory Visit
Admission: RE | Admit: 2013-03-13 | Discharge: 2013-03-13 | Disposition: A | Discharge status: Home / Self Care | Payer: Medicare Other | Source: Ambulatory Visit | Attending: Gastroenterology | Admitting: Gastroenterology

## 2013-03-13 DIAGNOSIS — R634 Abnormal weight loss: Secondary | ICD-10-CM

## 2013-03-13 DIAGNOSIS — R110 Nausea: Secondary | ICD-10-CM

## 2013-03-13 DIAGNOSIS — K449 Diaphragmatic hernia without obstruction or gangrene: Secondary | ICD-10-CM | POA: Diagnosis not present

## 2013-03-13 DIAGNOSIS — K5730 Diverticulosis of large intestine without perforation or abscess without bleeding: Secondary | ICD-10-CM | POA: Diagnosis not present

## 2013-03-13 MED ORDER — IOHEXOL 300 MG/ML  SOLN
100.0000 mL | Freq: Once | INTRAMUSCULAR | Status: AC | PRN
  Administered 2013-03-13: 100 mL via INTRAVENOUS

## 2013-03-24 DIAGNOSIS — R110 Nausea: Secondary | ICD-10-CM | POA: Diagnosis not present

## 2013-03-24 DIAGNOSIS — F411 Generalized anxiety disorder: Secondary | ICD-10-CM | POA: Diagnosis not present

## 2013-03-24 DIAGNOSIS — G4700 Insomnia, unspecified: Secondary | ICD-10-CM | POA: Diagnosis not present

## 2013-03-24 DIAGNOSIS — N289 Disorder of kidney and ureter, unspecified: Secondary | ICD-10-CM | POA: Diagnosis not present

## 2013-03-31 DIAGNOSIS — F411 Generalized anxiety disorder: Secondary | ICD-10-CM | POA: Diagnosis not present

## 2013-03-31 DIAGNOSIS — G4700 Insomnia, unspecified: Secondary | ICD-10-CM | POA: Diagnosis not present

## 2013-03-31 DIAGNOSIS — R110 Nausea: Secondary | ICD-10-CM | POA: Diagnosis not present

## 2013-03-31 DIAGNOSIS — R109 Unspecified abdominal pain: Secondary | ICD-10-CM | POA: Diagnosis not present

## 2013-04-28 DIAGNOSIS — G4700 Insomnia, unspecified: Secondary | ICD-10-CM | POA: Diagnosis not present

## 2013-04-28 DIAGNOSIS — F411 Generalized anxiety disorder: Secondary | ICD-10-CM | POA: Diagnosis not present

## 2013-04-28 DIAGNOSIS — M549 Dorsalgia, unspecified: Secondary | ICD-10-CM | POA: Diagnosis not present

## 2013-05-22 ENCOUNTER — Ambulatory Visit (HOSPITAL_COMMUNITY): Payer: Medicare Other | Admitting: Psychiatry

## 2013-05-25 DIAGNOSIS — M25569 Pain in unspecified knee: Secondary | ICD-10-CM | POA: Diagnosis not present

## 2013-06-08 DIAGNOSIS — M25569 Pain in unspecified knee: Secondary | ICD-10-CM | POA: Diagnosis not present

## 2013-06-08 DIAGNOSIS — M1710 Unilateral primary osteoarthritis, unspecified knee: Secondary | ICD-10-CM | POA: Diagnosis not present

## 2013-06-09 DIAGNOSIS — E1159 Type 2 diabetes mellitus with other circulatory complications: Secondary | ICD-10-CM | POA: Diagnosis not present

## 2013-06-09 DIAGNOSIS — IMO0002 Reserved for concepts with insufficient information to code with codable children: Secondary | ICD-10-CM | POA: Diagnosis not present

## 2013-06-09 DIAGNOSIS — L608 Other nail disorders: Secondary | ICD-10-CM | POA: Diagnosis not present

## 2013-06-09 DIAGNOSIS — I739 Peripheral vascular disease, unspecified: Secondary | ICD-10-CM | POA: Diagnosis not present

## 2013-06-11 DIAGNOSIS — E119 Type 2 diabetes mellitus without complications: Secondary | ICD-10-CM | POA: Diagnosis not present

## 2013-06-11 DIAGNOSIS — E039 Hypothyroidism, unspecified: Secondary | ICD-10-CM | POA: Diagnosis not present

## 2013-06-12 DIAGNOSIS — M5137 Other intervertebral disc degeneration, lumbosacral region: Secondary | ICD-10-CM | POA: Diagnosis not present

## 2013-06-12 DIAGNOSIS — M545 Low back pain: Secondary | ICD-10-CM | POA: Diagnosis not present

## 2013-06-15 DIAGNOSIS — M1710 Unilateral primary osteoarthritis, unspecified knee: Secondary | ICD-10-CM | POA: Diagnosis not present

## 2013-06-19 DIAGNOSIS — M1710 Unilateral primary osteoarthritis, unspecified knee: Secondary | ICD-10-CM | POA: Diagnosis not present

## 2013-06-24 DIAGNOSIS — H26499 Other secondary cataract, unspecified eye: Secondary | ICD-10-CM | POA: Diagnosis not present

## 2013-06-24 DIAGNOSIS — E119 Type 2 diabetes mellitus without complications: Secondary | ICD-10-CM | POA: Diagnosis not present

## 2013-06-24 DIAGNOSIS — H3530 Unspecified macular degeneration: Secondary | ICD-10-CM | POA: Diagnosis not present

## 2013-06-25 DIAGNOSIS — M1710 Unilateral primary osteoarthritis, unspecified knee: Secondary | ICD-10-CM | POA: Diagnosis not present

## 2013-06-26 DIAGNOSIS — M1710 Unilateral primary osteoarthritis, unspecified knee: Secondary | ICD-10-CM | POA: Diagnosis not present

## 2013-07-02 ENCOUNTER — Ambulatory Visit (HOSPITAL_COMMUNITY): Payer: 59 | Admitting: Psychiatry

## 2013-07-13 ENCOUNTER — Encounter: Payer: Self-pay | Admitting: Cardiovascular Disease

## 2013-07-13 ENCOUNTER — Ambulatory Visit (INDEPENDENT_AMBULATORY_CARE_PROVIDER_SITE_OTHER): Payer: Medicare Other | Admitting: Cardiovascular Disease

## 2013-07-13 VITALS — BP 118/60 | HR 79 | Ht 61.0 in | Wt 182.0 lb

## 2013-07-13 DIAGNOSIS — E039 Hypothyroidism, unspecified: Secondary | ICD-10-CM

## 2013-07-13 DIAGNOSIS — E782 Mixed hyperlipidemia: Secondary | ICD-10-CM | POA: Diagnosis not present

## 2013-07-13 DIAGNOSIS — I10 Essential (primary) hypertension: Secondary | ICD-10-CM

## 2013-07-13 DIAGNOSIS — I2510 Atherosclerotic heart disease of native coronary artery without angina pectoris: Secondary | ICD-10-CM | POA: Diagnosis not present

## 2013-07-13 DIAGNOSIS — E119 Type 2 diabetes mellitus without complications: Secondary | ICD-10-CM

## 2013-07-13 DIAGNOSIS — E785 Hyperlipidemia, unspecified: Secondary | ICD-10-CM

## 2013-07-13 NOTE — Patient Instructions (Signed)
Your physician recommends that you return for lab work fasting.  Your physician recommends that you schedule a follow-up appointment in: 1 YEAR 

## 2013-07-15 ENCOUNTER — Encounter: Payer: Self-pay | Admitting: Cardiovascular Disease

## 2013-07-15 NOTE — Progress Notes (Signed)
Patient ID: [name], female   DOB: 10/02/1924, 79 y.o.   MRN: [id number]      HPI: [name], is a 79 y.o. female who has known coronary artery disease and presents to the office today for a six-month followup evaluation.  [name] underwent  high-speed rotational atherectomy of a calcified LAD in the region of the very large diagonal vessel by me in 2000. She had excellent angiographic result. She been cared for by Dr. Clarene Duke in the past. She has a history of hypothyroidism, hyperlipidemia, asthma, as well as hypertension. She apparently was recently hospitalized for lower chest pain and had transient ECG changes with transient left bundle branch block, right bundle branch block. Her beta blocker dose was reduced. She was felt most likely to have GI symptoms. An upper GI did suggest the possibility of a hiatal hernia. She underwent endoscopy  and she was told of having some small polyps. Remotely she had been on Paxil and most recently has been on Prilosec presents for subsequent cardiology evaluation. Over the past 6 months, she has continued to be stable from a cardiac standpoint. She uses a walker secondary to her significant arthritis. She does have some asthma. She denies recent chest pain. She denies tachycardia palpitations. There is no presyncope.  Past Medical History  Diagnosis Date  . Asthma   . Coronary artery disease   . Diabetes mellitus without complication   . Hypothyroidism   . GERD (gastroesophageal reflux disease)   . Insomnia   . Anxiety   . Hypertension   . CAD (coronary artery disease)   . Osteopenia   . Osteoarthritis   . H/O echocardiogram 06-26-2012    EF 55%  . H/O cardiovascular stress test 06-26-2012    compared to previous study there is no significant change; normal myocardial perfusion study. no significant ischemia demonstrated . this is a low risk scan  . History of cardiac cath 09-30-1998    cath done by Dr. Tresa Endo, successful rotation atherectomy  of the left anterior decending artert with reduction from 80 to105  . Hx of cardiac cath 09-26-1998    dignostic cath dr. Tresa Endo to do rotoblator at a later date  . History of Doppler ultrasound 03-30-2008    neg abd. doppler for aneurysm  . History of Holter monitoring 12-22-2007    NSR with pac occs. pvc's    Past Surgical History  Procedure Laterality Date  . Appendectomy  1947  . Dilation and curettage of uterus  1960  . Vesicovaginal fistula closure w/ tah  1971    Dr Dewaine Conger  . Bladder tack  1970    Dr Earlene Plater  . Breast lumpectomy  1970    Left, Dr Barbera Setters  . Nasal sinus surgery  1980    Left; for chronic sinusitis  . Revision total knee arthroplasty  2005    Dr Charlann Boxer Right knee    No Known Allergies  Current Outpatient Prescriptions  Medication Sig Dispense Refill  . acetaminophen-codeine (TYLENOL #3) 300-30 MG per tablet Take 1 tablet by mouth every 4 (four) hours as needed for moderate pain.      Marland Kitchen albuterol (PROVENTIL HFA;VENTOLIN HFA) 108 (90 BASE) MCG/ACT inhaler Inhale 2 puffs into the lungs every 6 (six) hours as needed. For shortness of breath      . aspirin EC 81 MG tablet Take 81 mg by mouth daily.      Marland Kitchen azelastine (ASTELIN) 137 MCG/SPRAY nasal spray Place 1 spray  into the nose 2 (two) times daily. Use in each nostril as directed      . beclomethasone (QVAR) 80 MCG/ACT inhaler Inhale 2 puffs into the lungs 2 (two) times daily.  1 Inhaler  prn  . CALCIUM PO Take 1 tablet by mouth daily.      . Cholecalciferol (VITAMIN D3) 2000 UNITS capsule Take 2,000 Units by mouth daily.      . clopidogrel (PLAVIX) 75 MG tablet Take 75 mg by mouth daily.      . clorazepate (TRANXENE) 15 MG tablet Take 15 mg by mouth 3 (three) times daily as needed. anxiety      . diclofenac sodium (VOLTAREN) 1 % GEL Apply 2 g topically as needed.      . ezetimibe (ZETIA) 10 MG tablet Take 10 mg by mouth at bedtime.      . fluticasone (FLONASE) 50 MCG/ACT nasal spray Place 1 spray into both nostrils  daily as needed for allergies or rhinitis.      Marland Kitchen glimepiride (AMARYL) 1 MG tablet Take 1 mg by mouth daily before breakfast.      . levocetirizine (XYZAL) 5 MG tablet Take 5 mg by mouth every morning.      Marland Kitchen levothyroxine (SYNTHROID, LEVOTHROID) 50 MCG tablet Take 25 mcg by mouth daily.       Marland Kitchen LORazepam (ATIVAN) 0.5 MG tablet Take 0.5 mg by mouth at bedtime.      . metoprolol succinate (TOPROL-XL) 50 MG 24 hr tablet Take 100 mg by mouth daily. Take with or immediately following a meal.      . MICARDIS HCT 80-25 MG per tablet Take 1 tablet by mouth daily.      . nitroGLYCERIN (NITROSTAT) 0.4 MG SL tablet Place 0.4 mg under the tongue every 5 (five) minutes as needed. For chest pain      . omeprazole (PRILOSEC) 20 MG capsule Take 20 mg by mouth daily.      . rosuvastatin (CRESTOR) 20 MG tablet Take 20 mg by mouth daily.      Marland Kitchen saccharomyces boulardii (FLORASTOR) 250 MG capsule Take 250 mg by mouth 2 (two) times daily.      . sucralfate (CARAFATE) 1 G tablet Take 1 g by mouth 4 (four) times daily.      Marland Kitchen zolpidem (AMBIEN) 10 MG tablet Take 10 mg by mouth at bedtime as needed for sleep.       No current facility-administered medications for this visit.    Socially she is widowed with one child one grandchild. She with her son. No tobacco history.  ROS is negative for fever chills or night sweats. She denies any skin rash. She denies change in vision. She does wear glasses. She denies cough or sputum production. She does admit to some fatigue. She denies anginal symptoms. She denies GI bleeding. She denies blood in her stool. She denies myalgias. She does have arthritis. There is a history of hypothyroidism. She also has history of hyperlipidemia as well as history of diabetes. Dr. Selena Batten had check laboratory this past summer. She denies anginal type symptoms. She denies wheezing. She denies recurrent palpitations. She denies presyncope or syncope. She denies significant leg swelling. Other  comprehensive 12 point system review is negative.   PE BP 118/60  Pulse 79  Ht 5\' 1"  (1.549 m)  Wt 182 lb (82.555 kg)  BMI 34.41 kg/m2  General: Alert, oriented, no distress.  HEENT: Normocephalic, atraumatic. Pupils round and reactive; sclera anicteric;  Nose  without nasal septal hypertrophy Mouth/Parynx benign; Mallinpatti scale 3 Neck: No JVD, no carotid briuts Lungs: clear to ausculatation and percussion; no wheezing or rales Heart: RRR, s1 s2 normal 1/6 systolic murmur appeared Abdomen: soft, nontender; no hepatosplenomehaly, BS+; abdominal aorta nontender and not dilated by palpation. Pulses 2+ Extremities: no clubbinbg cyanosis or edema, Homan's sign negative  Neurologic: grossly nonfocal  ECG: Normal sinus rhythm at 79 beats per minute. Mild incomplete conduction delay. Normal intervals. LABS:  BMET    Component Value Date/Time   NA 144 12/01/2012 0650   K 3.3* 12/01/2012 0650   CL 103 12/01/2012 0650   CO2 31 12/01/2012 0650   GLUCOSE 125* 12/01/2012 0650   BUN 17 12/01/2012 0650   CREATININE 0.73 12/01/2012 0650   CALCIUM 9.7 12/01/2012 0650   GFRNONAA 76* 12/01/2012 0650   GFRAA 88* 12/01/2012 0650     Hepatic Function Panel     Component Value Date/Time   PROT 7.1 11/30/2012 1406   ALBUMIN 3.5 11/30/2012 1406   AST 23 11/30/2012 1406   ALT 22 11/30/2012 1406   ALKPHOS 62 11/30/2012 1406   BILITOT 0.3 11/30/2012 1406     CBC    Component Value Date/Time   WBC 5.6 12/01/2012 0650   RBC 4.53 12/01/2012 0650   HGB 12.0 12/01/2012 0650   HCT 36.8 12/01/2012 0650   PLT 195 12/01/2012 0650   MCV 81.2 12/01/2012 0650   MCH 26.5 12/01/2012 0650   MCHC 32.6 12/01/2012 0650   RDW 14.1 12/01/2012 0650   LYMPHSABS 2.1 07/16/2010 1508   MONOABS 0.8 07/16/2010 1508   EOSABS 0.3 07/16/2010 1508   BASOSABS 0.0 07/16/2010 1508     BNP    Component Value Date/Time   PROBNP 47.0 07/16/2010 1508    Lipid Panel  No results found for this basename: chol,  trig,  hdl,   cholhdl,  vldl,  ldlcalc     RADIOLOGY: No results found.    ASSESSMENT AND PLAN: From a cardiac standpoint, [name] continues to be doing well. An echo Doppler study November 2013 showed an ejection fraction greater than 55% normal systolic function with grade 1 diastolic dysfunction. She did have moderate aortic sclerosis without stenosis. There was mild mitral regurgitation and tricuspid regurgitation. A nuclear perfusion study November 2013 revealed normal perfusion without scar or ischemia. Post-rest ejection fraction was 73%. Presently, she is without anginal symptoms. Her blood pressure is well-controlled on combination therapy consisting of myocarditis HCT 80/25, Toprol-XL 100 mg.  She does have arthritic issues. She is on lipid-lowering therapy with Crestor 20 mg and Zetia 10 mg. She is on antiplatelet therapy with Plavix and aspirin 81 mg. As long as she remains stable, we'll see her in 6 months for cardiology reevaluation.   Lennette Bihari, MD, [facility]  07/15/2013 4:53 PM

## 2013-07-20 ENCOUNTER — Encounter: Payer: Self-pay | Admitting: Cardiovascular Disease

## 2013-08-03 ENCOUNTER — Emergency Department (HOSPITAL_COMMUNITY): Payer: Medicare Other

## 2013-08-03 ENCOUNTER — Emergency Department (HOSPITAL_COMMUNITY)
Admission: EM | Admit: 2013-08-03 | Discharge: 2013-08-04 | Disposition: A | Discharge status: Home / Self Care | Payer: Medicare Other | Attending: Emergency Medicine | Admitting: Emergency Medicine

## 2013-08-03 ENCOUNTER — Encounter (HOSPITAL_COMMUNITY): Payer: Self-pay | Admitting: Emergency Medicine

## 2013-08-03 DIAGNOSIS — E039 Hypothyroidism, unspecified: Secondary | ICD-10-CM | POA: Insufficient documentation

## 2013-08-03 DIAGNOSIS — Z7982 Long term (current) use of aspirin: Secondary | ICD-10-CM | POA: Diagnosis not present

## 2013-08-03 DIAGNOSIS — M899 Disorder of bone, unspecified: Secondary | ICD-10-CM | POA: Diagnosis not present

## 2013-08-03 DIAGNOSIS — K219 Gastro-esophageal reflux disease without esophagitis: Secondary | ICD-10-CM | POA: Insufficient documentation

## 2013-08-03 DIAGNOSIS — IMO0002 Reserved for concepts with insufficient information to code with codable children: Secondary | ICD-10-CM | POA: Diagnosis not present

## 2013-08-03 DIAGNOSIS — Z7902 Long term (current) use of antithrombotics/antiplatelets: Secondary | ICD-10-CM | POA: Diagnosis not present

## 2013-08-03 DIAGNOSIS — R079 Chest pain, unspecified: Secondary | ICD-10-CM | POA: Diagnosis not present

## 2013-08-03 DIAGNOSIS — M1990 Unspecified osteoarthritis, unspecified site: Secondary | ICD-10-CM | POA: Diagnosis not present

## 2013-08-03 DIAGNOSIS — R071 Chest pain on breathing: Secondary | ICD-10-CM | POA: Diagnosis not present

## 2013-08-03 DIAGNOSIS — I10 Essential (primary) hypertension: Secondary | ICD-10-CM | POA: Diagnosis not present

## 2013-08-03 DIAGNOSIS — Z79899 Other long term (current) drug therapy: Secondary | ICD-10-CM | POA: Insufficient documentation

## 2013-08-03 DIAGNOSIS — R911 Solitary pulmonary nodule: Secondary | ICD-10-CM | POA: Diagnosis not present

## 2013-08-03 DIAGNOSIS — Z95818 Presence of other cardiac implants and grafts: Secondary | ICD-10-CM | POA: Diagnosis not present

## 2013-08-03 DIAGNOSIS — G4700 Insomnia, unspecified: Secondary | ICD-10-CM | POA: Diagnosis not present

## 2013-08-03 DIAGNOSIS — R0781 Pleurodynia: Secondary | ICD-10-CM

## 2013-08-03 DIAGNOSIS — Z9889 Other specified postprocedural states: Secondary | ICD-10-CM | POA: Insufficient documentation

## 2013-08-03 DIAGNOSIS — I2510 Atherosclerotic heart disease of native coronary artery without angina pectoris: Secondary | ICD-10-CM | POA: Insufficient documentation

## 2013-08-03 DIAGNOSIS — J45909 Unspecified asthma, uncomplicated: Secondary | ICD-10-CM | POA: Diagnosis not present

## 2013-08-03 DIAGNOSIS — E119 Type 2 diabetes mellitus without complications: Secondary | ICD-10-CM | POA: Insufficient documentation

## 2013-08-03 DIAGNOSIS — J9819 Other pulmonary collapse: Secondary | ICD-10-CM | POA: Diagnosis not present

## 2013-08-03 DIAGNOSIS — F411 Generalized anxiety disorder: Secondary | ICD-10-CM | POA: Insufficient documentation

## 2013-08-03 DIAGNOSIS — R0602 Shortness of breath: Secondary | ICD-10-CM | POA: Diagnosis not present

## 2013-08-03 LAB — POCT I-STAT TROPONIN I: Troponin i, poc: 0 ng/mL (ref 0.00–0.08)

## 2013-08-03 NOTE — ED Notes (Signed)
Pt reports she was putting on her pajamas and began having R sided lower rib pain, denies injuring this area, reports feeling ShOB with pain with deep inspiration.

## 2013-08-04 ENCOUNTER — Emergency Department (HOSPITAL_COMMUNITY): Payer: Medicare Other

## 2013-08-04 ENCOUNTER — Encounter (HOSPITAL_COMMUNITY): Payer: Self-pay

## 2013-08-04 DIAGNOSIS — R911 Solitary pulmonary nodule: Secondary | ICD-10-CM | POA: Diagnosis not present

## 2013-08-04 DIAGNOSIS — J9819 Other pulmonary collapse: Secondary | ICD-10-CM | POA: Diagnosis not present

## 2013-08-04 LAB — HEPATIC FUNCTION PANEL
AST: 28 U/L (ref 0–37)
Albumin: 3.7 g/dL (ref 3.5–5.2)
Total Bilirubin: 0.1 mg/dL — ABNORMAL LOW (ref 0.3–1.2)
Total Protein: 7.2 g/dL (ref 6.0–8.3)

## 2013-08-04 LAB — POCT I-STAT TROPONIN I: Troponin i, poc: 0.01 ng/mL (ref 0.00–0.08)

## 2013-08-04 LAB — BASIC METABOLIC PANEL
BUN: 31 mg/dL — ABNORMAL HIGH (ref 6–23)
CO2: 24 mEq/L (ref 19–32)
Chloride: 102 mEq/L (ref 96–112)
Creatinine, Ser: 0.94 mg/dL (ref 0.50–1.10)
GFR calc non Af Amer: 53 mL/min — ABNORMAL LOW (ref >90)
Sodium: 139 mEq/L (ref 135–145)

## 2013-08-04 LAB — TROPONIN I: Troponin I: <0.3 ng/mL (ref <0.30)

## 2013-08-04 LAB — CBC
HCT: 38.7 % (ref 36.0–46.0)
Hemoglobin: 12.3 g/dL (ref 12.0–15.0)
MCHC: 31.8 g/dL (ref 30.0–36.0)
MCV: 83.2 fL (ref 78.0–100.0)
RDW: 13.5 % (ref 11.5–15.5)
WBC: 9.8 10*3/uL (ref 4.0–10.5)

## 2013-08-04 MED ORDER — POTASSIUM CHLORIDE CRYS ER 20 MEQ PO TBCR
40.0000 meq | EXTENDED_RELEASE_TABLET | Freq: Once | ORAL | Status: AC
  Administered 2013-08-04: 40 meq via ORAL
  Filled 2013-08-04: qty 2

## 2013-08-04 MED ORDER — IOHEXOL 350 MG/ML SOLN
100.0000 mL | Freq: Once | INTRAVENOUS | Status: AC | PRN
  Administered 2013-08-04: 100 mL via INTRAVENOUS

## 2013-08-04 MED ORDER — FENTANYL CITRATE 0.05 MG/ML IJ SOLN
50.0000 ug | INTRAMUSCULAR | Status: DC | PRN
  Administered 2013-08-04: 50 ug via INTRAVENOUS
  Filled 2013-08-04: qty 2

## 2013-08-04 MED ORDER — ONDANSETRON HCL 4 MG/2ML IJ SOLN
4.0000 mg | Freq: Once | INTRAMUSCULAR | Status: DC
  Filled 2013-08-04: qty 2

## 2013-08-04 NOTE — ED Provider Notes (Signed)
CSN: [id number]     Arrival date & time 08/03/13  2202 History   First MD Initiated Contact with Patient 08/04/13 0032     Chief Complaint  Patient presents with  . Rib Pain    (Consider location/radiation/quality/duration/timing/severity/associated sxs/prior Treatment) HPI Hx per PT - R sided rib pain onset tonight at home. Was putting on her pajamas with inset of pain, she is unable to say if she was bending or twisting, pain sharp and severe, now worse with deep inspiration. No radiation of pain, no pain if she lays still and breaths easy, no SOB otherwise. Lives alone.   Past Medical History  Diagnosis Date  . Asthma   . Coronary artery disease   . Diabetes mellitus without complication   . Hypothyroidism   . GERD (gastroesophageal reflux disease)   . Insomnia   . Anxiety   . Hypertension   . CAD (coronary artery disease)   . Osteopenia   . Osteoarthritis   . H/O echocardiogram 06-26-2012    EF 55%  . H/O cardiovascular stress test 06-26-2012    compared to previous study there is no significant change; normal myocardial perfusion study. no significant ischemia demonstrated . this is a low risk scan  . History of cardiac cath 09-30-1998    cath done by Dr. Tresa Endo, successful rotation atherectomy of the left anterior decending artert with reduction from 80 to105  . Hx of cardiac cath 09-26-1998    dignostic cath dr. Tresa Endo to do rotoblator at a later date  . History of Doppler ultrasound 03-30-2008    neg abd. doppler for aneurysm  . History of Holter monitoring 12-22-2007    NSR with pac occs. pvc's   Past Surgical History  Procedure Laterality Date  . Appendectomy  1947  . Dilation and curettage of uterus  1960  . Vesicovaginal fistula closure w/ tah  1971    Dr Dewaine Conger  . Bladder tack  1970    Dr Earlene Plater  . Breast lumpectomy  1970    Left, Dr Barbera Setters  . Nasal sinus surgery  1980    Left; for chronic sinusitis  . Revision total knee arthroplasty  2005    Dr Charlann Boxer Right knee    Family History  Problem Relation Age of Onset  . Diabetes Father   . Parkinson's disease Father   . Heart disease Father   . Asthma Father   . Diabetes Mother   . Heart disease Mother   . Heart disease Brother   . Diabetes Brother   . Diabetes Brother   . Diabetes Brother    History  Substance Use Topics  . Smoking status: Never Smoker   . Smokeless tobacco: Never Used  . Alcohol Use: No   OB History   Grav Para Term Preterm Abortions TAB SAB Ect Mult Living                 Review of Systems  Constitutional: Negative for fever and chills.  Eyes: Negative for pain.  Respiratory: Negative for shortness of breath.   Cardiovascular: Positive for chest pain.  Gastrointestinal: Negative for vomiting and abdominal pain.  Genitourinary: Negative for dysuria.  Musculoskeletal: Negative for back pain, neck pain and neck stiffness.  Skin: Negative for rash.  Neurological: Negative for headaches.  All other systems reviewed and are negative.    Allergies  Review of patient's allergies indicates no known allergies.  Home Medications   Current Outpatient Rx  Name  Route  Sig  Dispense  Refill  . acetaminophen-codeine (TYLENOL #3) 300-30 MG per tablet   Oral   Take 1 tablet by mouth every 4 (four) hours as needed for moderate pain.         Marland Kitchen albuterol (PROVENTIL HFA;VENTOLIN HFA) 108 (90 BASE) MCG/ACT inhaler   Inhalation   Inhale 2 puffs into the lungs every 6 (six) hours as needed. For shortness of breath         . aspirin EC 81 MG tablet   Oral   Take 81 mg by mouth at bedtime.          Marland Kitchen azelastine (ASTELIN) 137 MCG/SPRAY nasal spray   Nasal   Place 1 spray into the nose 2 (two) times daily. Use in each nostril as directed         . beclomethasone (QVAR) 80 MCG/ACT inhaler   Inhalation   Inhale 2 puffs into the lungs 2 (two) times daily.   1 Inhaler   prn   . CALCIUM PO   Oral   Take 1 tablet by mouth daily.         . Cholecalciferol (VITAMIN  D3) 2000 UNITS capsule   Oral   Take 2,000 Units by mouth daily.         . clopidogrel (PLAVIX) 75 MG tablet   Oral   Take 75 mg by mouth daily.         . clorazepate (TRANXENE) 15 MG tablet   Oral   Take 15 mg by mouth at bedtime. anxiety         . ezetimibe (ZETIA) 10 MG tablet   Oral   Take 10 mg by mouth at bedtime.         . fluticasone (FLONASE) 50 MCG/ACT nasal spray   Each Nare   Place 1 spray into both nostrils daily as needed for allergies or rhinitis.         Marland Kitchen glimepiride (AMARYL) 1 MG tablet   Oral   Take 1 mg by mouth daily before breakfast.         . levothyroxine (SYNTHROID, LEVOTHROID) 50 MCG tablet   Oral   Take 25 mcg by mouth daily.          Marland Kitchen LORazepam (ATIVAN) 0.5 MG tablet   Oral   Take 0.5 mg by mouth at bedtime.         . metoprolol succinate (TOPROL-XL) 50 MG 24 hr tablet   Oral   Take 100 mg by mouth daily. Take with or immediately following a meal.         . MICARDIS HCT 80-25 MG per tablet   Oral   Take 1 tablet by mouth daily.         . nitroGLYCERIN (NITROSTAT) 0.4 MG SL tablet   Sublingual   Place 0.4 mg under the tongue every 5 (five) minutes as needed. For chest pain         . omeprazole (PRILOSEC) 20 MG capsule   Oral   Take 20 mg by mouth daily.         . rosuvastatin (CRESTOR) 20 MG tablet   Oral   Take 20 mg by mouth at bedtime.          Marland Kitchen saccharomyces boulardii (FLORASTOR) 250 MG capsule   Oral   Take 250 mg by mouth 2 (two) times daily.         . sucralfate (CARAFATE) 1 G tablet   Oral  Take 1 g by mouth 4 (four) times daily.         Marland Kitchen zolpidem (AMBIEN) 10 MG tablet   Oral   Take 10 mg by mouth at bedtime.           BP 143/64  Pulse 78  Temp(Src) 99 F (37.2 C) (Oral)  Resp 20  SpO2 94% Physical Exam  Constitutional: She is oriented to person, place, and time. She appears well-developed and well-nourished.  HENT:  Head: Normocephalic and atraumatic.  Eyes: EOM are  normal. Pupils are equal, round, and reactive to light.  Neck: Neck supple.  Cardiovascular: Normal rate, regular rhythm and intact distal pulses.   Pulmonary/Chest: Effort normal and breath sounds normal. No respiratory distress. She exhibits no tenderness.  No rash or crepitus  Abdominal: Soft. Bowel sounds are normal. She exhibits no distension. There is no tenderness.  No RUQ TTP, neg Murphys  Musculoskeletal: Normal range of motion. She exhibits no edema.  Neurological: She is alert and oriented to person, place, and time.  Skin: Skin is warm and dry.    ED Course  Procedures (including critical care time) Labs Review Labs Reviewed  BASIC METABOLIC PANEL - Abnormal; Notable for the following:    Potassium 3.4 (*)    Glucose, Bld 130 (*)    BUN 31 (*)    GFR calc non Af Amer 53 (*)    GFR calc Af Amer 62 (*)    All other components within normal limits  D-DIMER, QUANTITATIVE - Abnormal; Notable for the following:    D-Dimer, Quant 1.66 (*)    All other components within normal limits  HEPATIC FUNCTION PANEL - Abnormal; Notable for the following:    Total Bilirubin 0.1 (*)    All other components within normal limits  CBC  TROPONIN I  LIPASE, BLOOD  POCT I-STAT TROPONIN I  POCT I-STAT TROPONIN I   Imaging Review Dg Chest 2 View (if Patient Has Fever And/or Copd)  08/03/2013   CLINICAL DATA:  Shortness of breath.  Right-sided chest pain  EXAM: CHEST  2 VIEW  COMPARISON:  11/30/2012  FINDINGS: Normal heart size and mediastinal contours. Mild thickening of the right minor fissure. No infiltrate or edema. No effusion or pneumothorax. No visible fracture.  IMPRESSION: No active cardiopulmonary disease.   Electronically Signed   By: Tiburcio Pea M.D.   On: 08/03/2013 23:02   Ct Angio Chest Pe W/cm &/or Wo Cm  08/04/2013   CLINICAL DATA:  Right-sided chest pain  EXAM: CT ANGIOGRAPHY CHEST WITH CONTRAST  TECHNIQUE: Multidetector CT imaging of the chest was performed using the  standard protocol during bolus administration of intravenous contrast. Multiplanar CT image reconstructions including MIPs were obtained to evaluate the vascular anatomy.  CONTRAST:  OMNIPAQUE IOHEXOL 350 MG/ML SOLN  COMPARISON:  None.  FINDINGS: THORACIC INLET/BODY WALL:  No acute abnormality.  MEDIASTINUM:  Normal heart size. No pericardial effusion. Extensive atherosclerosis, including the coronary arteries diffusely. No acute vascular abnormality, including aortic dissection or pulmonary embolism. No adenopathy.  LUNG WINDOWS:  No consolidation or edema. Mild diffuse bronchial wall thickening. No pneumothorax or pleural effusion. Mild atelectasis in the right lower and middle lobes. 4 mm pulmonary nodule in the peripheral right lower lobe.  UPPER ABDOMEN:  No acute findings.  OSSEOUS:  No acute fracture.  No suspicious lytic or blastic lesions.  Review of the MIP images confirms the above findings.  IMPRESSION: 1. Negative for pulmonary embolism or other  acute intrathoracic disease. 2. 4 mm pulmonary nodule on the right.   Electronically Signed   By: Tiburcio Pea M.D.   On: 08/04/2013 01:55    EKG Interpretation    Date/Time:  Tuesday August 04 2013 02:17:54 EST Ventricular Rate:  68 PR Interval:  169 QRS Duration: 92 QT Interval:  419 QTC Calculation: 446 R Axis:   48 Text Interpretation:  Sinus rhythm Borderline repolarization abnormality Nonspecific ST abnormality Abnormal ECG No significant change since last tracing Confirmed by Silvanna Ohmer  MD, Altonio Schwertner ([id number]) on 08/04/2013 2:39:42 AM           Room air pulse ox 94% is adequate  IV fentanyl. IV Zofran. Potassium for mild hypokalemia.  3:15 AM on recheck has been ambulating to the bathroom still with some pain, sharp and worse with movement.   I offered patient further evaluation but she prefers to be discharged home. Plan treatment for musculoskeletal etiology of symptoms. She agrees to recheck with her physician in one to 2 days  for any persistent symptoms or return here for any worsening condition. She has Tylenol No. 3 at home and prefers to take those as needed.  MDM  Diagnoses: Right lateral rib pain  Evaluated with chest x-ray labs, EKG and CT scan as above. No pneumothorax. No PE. Incidental pulmonary nodule.  Treated with IV narcotics with intermittent pain control Vital signs and nursing notes reviewed and considered    Sunnie Nielsen, MD 08/04/13 [phone]

## 2013-09-10 DIAGNOSIS — R51 Headache: Secondary | ICD-10-CM | POA: Diagnosis not present

## 2013-09-10 DIAGNOSIS — L989 Disorder of the skin and subcutaneous tissue, unspecified: Secondary | ICD-10-CM | POA: Diagnosis not present

## 2013-09-17 DIAGNOSIS — L84 Corns and callosities: Secondary | ICD-10-CM | POA: Diagnosis not present

## 2013-09-17 DIAGNOSIS — L608 Other nail disorders: Secondary | ICD-10-CM | POA: Diagnosis not present

## 2013-09-17 DIAGNOSIS — I739 Peripheral vascular disease, unspecified: Secondary | ICD-10-CM | POA: Diagnosis not present

## 2013-09-17 DIAGNOSIS — E1159 Type 2 diabetes mellitus with other circulatory complications: Secondary | ICD-10-CM | POA: Diagnosis not present

## 2013-10-28 DIAGNOSIS — H61009 Unspecified perichondritis of external ear, unspecified ear: Secondary | ICD-10-CM | POA: Diagnosis not present

## 2013-11-02 ENCOUNTER — Other Ambulatory Visit: Payer: Self-pay | Admitting: *Deleted

## 2013-11-02 MED ORDER — METOPROLOL SUCCINATE ER 50 MG PO TB24: 100.0000 mg | ORAL_TABLET | Freq: Every day | ORAL | Status: DC

## 2013-11-02 NOTE — Telephone Encounter (Signed)
Rx was sent to pharmacy electronically. 

## 2013-12-03 DIAGNOSIS — I10 Essential (primary) hypertension: Secondary | ICD-10-CM | POA: Diagnosis not present

## 2013-12-03 DIAGNOSIS — E039 Hypothyroidism, unspecified: Secondary | ICD-10-CM | POA: Diagnosis not present

## 2013-12-03 DIAGNOSIS — E119 Type 2 diabetes mellitus without complications: Secondary | ICD-10-CM | POA: Diagnosis not present

## 2013-12-14 DIAGNOSIS — I739 Peripheral vascular disease, unspecified: Secondary | ICD-10-CM | POA: Diagnosis not present

## 2013-12-14 DIAGNOSIS — L84 Corns and callosities: Secondary | ICD-10-CM | POA: Diagnosis not present

## 2013-12-14 DIAGNOSIS — E1159 Type 2 diabetes mellitus with other circulatory complications: Secondary | ICD-10-CM | POA: Diagnosis not present

## 2013-12-14 DIAGNOSIS — L608 Other nail disorders: Secondary | ICD-10-CM | POA: Diagnosis not present

## 2013-12-15 DIAGNOSIS — F411 Generalized anxiety disorder: Secondary | ICD-10-CM | POA: Diagnosis not present

## 2013-12-15 DIAGNOSIS — I10 Essential (primary) hypertension: Secondary | ICD-10-CM | POA: Diagnosis not present

## 2013-12-15 DIAGNOSIS — E119 Type 2 diabetes mellitus without complications: Secondary | ICD-10-CM | POA: Diagnosis not present

## 2013-12-15 DIAGNOSIS — E039 Hypothyroidism, unspecified: Secondary | ICD-10-CM | POA: Diagnosis not present

## 2014-01-06 DIAGNOSIS — H0019 Chalazion unspecified eye, unspecified eyelid: Secondary | ICD-10-CM | POA: Diagnosis not present

## 2014-01-18 ENCOUNTER — Encounter (HOSPITAL_COMMUNITY): Payer: Self-pay | Admitting: Emergency Medicine

## 2014-01-18 ENCOUNTER — Observation Stay (HOSPITAL_COMMUNITY)
Admission: EM | Admit: 2014-01-18 | Discharge: 2014-01-19 | Disposition: A | Discharge status: Home / Self Care | Payer: Medicare Other | Attending: Family Medicine | Admitting: Family Medicine

## 2014-01-18 ENCOUNTER — Emergency Department (HOSPITAL_COMMUNITY): Payer: Medicare Other

## 2014-01-18 DIAGNOSIS — I4891 Unspecified atrial fibrillation: Secondary | ICD-10-CM | POA: Diagnosis not present

## 2014-01-18 DIAGNOSIS — Z7982 Long term (current) use of aspirin: Secondary | ICD-10-CM | POA: Diagnosis not present

## 2014-01-18 DIAGNOSIS — E039 Hypothyroidism, unspecified: Secondary | ICD-10-CM | POA: Diagnosis not present

## 2014-01-18 DIAGNOSIS — R0902 Hypoxemia: Secondary | ICD-10-CM

## 2014-01-18 DIAGNOSIS — Z79899 Other long term (current) drug therapy: Secondary | ICD-10-CM | POA: Diagnosis not present

## 2014-01-18 DIAGNOSIS — R079 Chest pain, unspecified: Secondary | ICD-10-CM

## 2014-01-18 DIAGNOSIS — R0602 Shortness of breath: Secondary | ICD-10-CM | POA: Diagnosis not present

## 2014-01-18 DIAGNOSIS — Z7902 Long term (current) use of antithrombotics/antiplatelets: Secondary | ICD-10-CM | POA: Diagnosis not present

## 2014-01-18 DIAGNOSIS — R05 Cough: Secondary | ICD-10-CM | POA: Diagnosis not present

## 2014-01-18 DIAGNOSIS — K219 Gastro-esophageal reflux disease without esophagitis: Secondary | ICD-10-CM | POA: Insufficient documentation

## 2014-01-18 DIAGNOSIS — J302 Other seasonal allergic rhinitis: Secondary | ICD-10-CM

## 2014-01-18 DIAGNOSIS — J45909 Unspecified asthma, uncomplicated: Secondary | ICD-10-CM | POA: Diagnosis not present

## 2014-01-18 DIAGNOSIS — I2510 Atherosclerotic heart disease of native coronary artery without angina pectoris: Secondary | ICD-10-CM | POA: Diagnosis not present

## 2014-01-18 DIAGNOSIS — I480 Paroxysmal atrial fibrillation: Secondary | ICD-10-CM

## 2014-01-18 DIAGNOSIS — J45901 Unspecified asthma with (acute) exacerbation: Principal | ICD-10-CM | POA: Diagnosis present

## 2014-01-18 DIAGNOSIS — Z9089 Acquired absence of other organs: Secondary | ICD-10-CM | POA: Diagnosis not present

## 2014-01-18 DIAGNOSIS — J3089 Other allergic rhinitis: Secondary | ICD-10-CM

## 2014-01-18 DIAGNOSIS — F411 Generalized anxiety disorder: Secondary | ICD-10-CM | POA: Diagnosis not present

## 2014-01-18 DIAGNOSIS — R9431 Abnormal electrocardiogram [ECG] [EKG]: Secondary | ICD-10-CM

## 2014-01-18 DIAGNOSIS — E785 Hyperlipidemia, unspecified: Secondary | ICD-10-CM | POA: Diagnosis not present

## 2014-01-18 DIAGNOSIS — R109 Unspecified abdominal pain: Secondary | ICD-10-CM

## 2014-01-18 DIAGNOSIS — E119 Type 2 diabetes mellitus without complications: Secondary | ICD-10-CM | POA: Diagnosis present

## 2014-01-18 DIAGNOSIS — I10 Essential (primary) hypertension: Secondary | ICD-10-CM | POA: Diagnosis present

## 2014-01-18 DIAGNOSIS — R0789 Other chest pain: Secondary | ICD-10-CM | POA: Diagnosis not present

## 2014-01-18 DIAGNOSIS — R0609 Other forms of dyspnea: Secondary | ICD-10-CM

## 2014-01-18 DIAGNOSIS — M129 Arthropathy, unspecified: Secondary | ICD-10-CM

## 2014-01-18 DIAGNOSIS — R059 Cough, unspecified: Secondary | ICD-10-CM | POA: Diagnosis not present

## 2014-01-18 LAB — URINALYSIS, ROUTINE W REFLEX MICROSCOPIC
BILIRUBIN URINE: NEGATIVE
Glucose, UA: NEGATIVE mg/dL
Hgb urine dipstick: NEGATIVE
Ketones, ur: NEGATIVE mg/dL
NITRITE: NEGATIVE
PROTEIN: NEGATIVE mg/dL
Specific Gravity, Urine: 1.016 (ref 1.005–1.030)
UROBILINOGEN UA: 0.2 mg/dL (ref 0.0–1.0)
pH: 5 (ref 5.0–8.0)

## 2014-01-18 LAB — CBC
HCT: 37.5 % (ref 36.0–46.0)
Hemoglobin: 12.1 g/dL (ref 12.0–15.0)
MCH: 26.4 pg (ref 26.0–34.0)
MCHC: 32.3 g/dL (ref 30.0–36.0)
MCV: 81.7 fL (ref 78.0–100.0)
PLATELETS: 208 10*3/uL (ref 150–400)
RBC: 4.59 MIL/uL (ref 3.87–5.11)
RDW: 14 % (ref 11.5–15.5)
WBC: 7.7 10*3/uL (ref 4.0–10.5)

## 2014-01-18 LAB — BASIC METABOLIC PANEL
BUN: 36 mg/dL — ABNORMAL HIGH (ref 6–23)
CALCIUM: 9.8 mg/dL (ref 8.4–10.5)
CO2: 25 mEq/L (ref 19–32)
CREATININE: 0.9 mg/dL (ref 0.50–1.10)
Chloride: 103 mEq/L (ref 96–112)
GFR calc Af Amer: 65 mL/min — ABNORMAL LOW (ref >90)
GFR calc non Af Amer: 56 mL/min — ABNORMAL LOW (ref >90)
GLUCOSE: 87 mg/dL (ref 70–99)
Potassium: 4.2 mEq/L (ref 3.7–5.3)
Sodium: 140 mEq/L (ref 137–147)

## 2014-01-18 LAB — URINE MICROSCOPIC-ADD ON

## 2014-01-18 LAB — PRO B NATRIURETIC PEPTIDE: Pro B Natriuretic peptide (BNP): 250.3 pg/mL (ref 0–450)

## 2014-01-18 LAB — TROPONIN I

## 2014-01-18 MED ORDER — TOPIRAMATE 25 MG PO TABS
25.0000 mg | ORAL_TABLET | Freq: Every day | ORAL | Status: DC
  Administered 2014-01-18: 25 mg via ORAL
  Filled 2014-01-18 (×2): qty 1

## 2014-01-18 MED ORDER — HYDROCHLOROTHIAZIDE 25 MG PO TABS
25.0000 mg | ORAL_TABLET | Freq: Every day | ORAL | Status: DC
  Administered 2014-01-19: 25 mg via ORAL
  Filled 2014-01-18: qty 1

## 2014-01-18 MED ORDER — LORAZEPAM 1 MG PO TABS
1.0000 mg | ORAL_TABLET | Freq: Three times a day (TID) | ORAL | Status: DC | PRN
  Administered 2014-01-19: 1 mg via ORAL
  Filled 2014-01-18: qty 1

## 2014-01-18 MED ORDER — FLUOXETINE HCL 10 MG PO CAPS
10.0000 mg | ORAL_CAPSULE | Freq: Every day | ORAL | Status: DC
  Administered 2014-01-19: 10 mg via ORAL
  Filled 2014-01-18: qty 1

## 2014-01-18 MED ORDER — ONDANSETRON HCL 4 MG/2ML IJ SOLN: 4.0000 mg | Freq: Four times a day (QID) | INTRAMUSCULAR | Status: DC | PRN

## 2014-01-18 MED ORDER — IRBESARTAN 300 MG PO TABS
300.0000 mg | ORAL_TABLET | Freq: Every day | ORAL | Status: DC
  Administered 2014-01-19: 300 mg via ORAL
  Filled 2014-01-18: qty 1

## 2014-01-18 MED ORDER — ACETAMINOPHEN 325 MG PO TABS
650.0000 mg | ORAL_TABLET | Freq: Four times a day (QID) | ORAL | Status: DC | PRN
  Administered 2014-01-18: 650 mg via ORAL
  Filled 2014-01-18: qty 2

## 2014-01-18 MED ORDER — ALBUTEROL SULFATE (2.5 MG/3ML) 0.083% IN NEBU: 2.5000 mg | INHALATION_SOLUTION | Freq: Four times a day (QID) | RESPIRATORY_TRACT | Status: DC | PRN

## 2014-01-18 MED ORDER — EZETIMIBE 10 MG PO TABS
10.0000 mg | ORAL_TABLET | Freq: Every day | ORAL | Status: DC
  Administered 2014-01-18: 10 mg via ORAL
  Filled 2014-01-18 (×2): qty 1

## 2014-01-18 MED ORDER — METOPROLOL SUCCINATE ER 100 MG PO TB24
100.0000 mg | ORAL_TABLET | Freq: Every day | ORAL | Status: DC
  Administered 2014-01-19: 100 mg via ORAL
  Filled 2014-01-18: qty 1

## 2014-01-18 MED ORDER — SACCHAROMYCES BOULARDII 250 MG PO CAPS
250.0000 mg | ORAL_CAPSULE | Freq: Two times a day (BID) | ORAL | Status: DC
  Administered 2014-01-18 – 2014-01-19 (×2): 250 mg via ORAL
  Filled 2014-01-18 (×3): qty 1

## 2014-01-18 MED ORDER — GLIMEPIRIDE 1 MG PO TABS
1.0000 mg | ORAL_TABLET | Freq: Every day | ORAL | Status: DC
  Administered 2014-01-19: 1 mg via ORAL
  Filled 2014-01-18 (×2): qty 1

## 2014-01-18 MED ORDER — ALBUTEROL SULFATE (2.5 MG/3ML) 0.083% IN NEBU
2.5000 mg | INHALATION_SOLUTION | RESPIRATORY_TRACT | Status: DC
  Administered 2014-01-18: 2.5 mg via RESPIRATORY_TRACT
  Filled 2014-01-18: qty 3

## 2014-01-18 MED ORDER — INSULIN ASPART 100 UNIT/ML ~~LOC~~ SOLN: 0.0000 [IU] | Freq: Three times a day (TID) | SUBCUTANEOUS | Status: DC

## 2014-01-18 MED ORDER — ACETAMINOPHEN 650 MG RE SUPP: 650.0000 mg | Freq: Four times a day (QID) | RECTAL | Status: DC | PRN

## 2014-01-18 MED ORDER — TELMISARTAN-HCTZ 80-25 MG PO TABS: 1.0000 | ORAL_TABLET | Freq: Every day | ORAL | Status: DC

## 2014-01-18 MED ORDER — AZELASTINE HCL 0.1 % NA SOLN
1.0000 | Freq: Two times a day (BID) | NASAL | Status: DC
  Administered 2014-01-18 – 2014-01-19 (×2): 1 via NASAL
  Filled 2014-01-18: qty 30

## 2014-01-18 MED ORDER — MONTELUKAST SODIUM 10 MG PO TABS
10.0000 mg | ORAL_TABLET | Freq: Every day | ORAL | Status: DC
  Administered 2014-01-18: 10 mg via ORAL
  Filled 2014-01-18 (×2): qty 1

## 2014-01-18 MED ORDER — IPRATROPIUM BROMIDE 0.02 % IN SOLN
0.5000 mg | Freq: Once | RESPIRATORY_TRACT | Status: AC
  Administered 2014-01-18: 0.5 mg via RESPIRATORY_TRACT
  Filled 2014-01-18: qty 2.5

## 2014-01-18 MED ORDER — ENOXAPARIN SODIUM 40 MG/0.4ML ~~LOC~~ SOLN
40.0000 mg | SUBCUTANEOUS | Status: DC
  Administered 2014-01-18: 40 mg via SUBCUTANEOUS
  Filled 2014-01-18 (×2): qty 0.4

## 2014-01-18 MED ORDER — ONDANSETRON HCL 4 MG PO TABS: 4.0000 mg | ORAL_TABLET | Freq: Four times a day (QID) | ORAL | Status: DC | PRN

## 2014-01-18 MED ORDER — AZITHROMYCIN 250 MG PO TABS
500.0000 mg | ORAL_TABLET | Freq: Every day | ORAL | Status: AC
  Administered 2014-01-18: 500 mg via ORAL
  Filled 2014-01-18: qty 2

## 2014-01-18 MED ORDER — CLOPIDOGREL BISULFATE 75 MG PO TABS
75.0000 mg | ORAL_TABLET | Freq: Every day | ORAL | Status: DC
  Administered 2014-01-19: 75 mg via ORAL
  Filled 2014-01-18: qty 1

## 2014-01-18 MED ORDER — ALBUTEROL SULFATE (2.5 MG/3ML) 0.083% IN NEBU: 2.5000 mg | INHALATION_SOLUTION | RESPIRATORY_TRACT | Status: DC | PRN

## 2014-01-18 MED ORDER — BUDESONIDE 0.25 MG/2ML IN SUSP
0.2500 mg | Freq: Two times a day (BID) | RESPIRATORY_TRACT | Status: DC
  Administered 2014-01-18 – 2014-01-19 (×2): 0.25 mg via RESPIRATORY_TRACT
  Filled 2014-01-18 (×4): qty 2

## 2014-01-18 MED ORDER — LEVOTHYROXINE SODIUM 25 MCG PO TABS
25.0000 ug | ORAL_TABLET | Freq: Every day | ORAL | Status: DC
  Administered 2014-01-19: 25 ug via ORAL
  Filled 2014-01-18 (×2): qty 1

## 2014-01-18 MED ORDER — PREDNISONE 20 MG PO TABS
60.0000 mg | ORAL_TABLET | Freq: Once | ORAL | Status: AC
  Administered 2014-01-18: 60 mg via ORAL
  Filled 2014-01-18: qty 3

## 2014-01-18 MED ORDER — QUETIAPINE FUMARATE 50 MG PO TABS
50.0000 mg | ORAL_TABLET | Freq: Every day | ORAL | Status: DC
  Administered 2014-01-19: 50 mg via ORAL
  Filled 2014-01-18 (×2): qty 1

## 2014-01-18 MED ORDER — SODIUM CHLORIDE 0.9 % IJ SOLN
3.0000 mL | Freq: Two times a day (BID) | INTRAMUSCULAR | Status: DC
  Administered 2014-01-18 – 2014-01-19 (×2): 3 mL via INTRAVENOUS

## 2014-01-18 MED ORDER — CLORAZEPATE DIPOTASSIUM 7.5 MG PO TABS
15.0000 mg | ORAL_TABLET | Freq: Every day | ORAL | Status: DC
  Administered 2014-01-18: 15 mg via ORAL
  Filled 2014-01-18: qty 2

## 2014-01-18 MED ORDER — ALBUTEROL SULFATE (2.5 MG/3ML) 0.083% IN NEBU
2.5000 mg | INHALATION_SOLUTION | Freq: Three times a day (TID) | RESPIRATORY_TRACT | Status: DC
  Administered 2014-01-19 (×2): 2.5 mg via RESPIRATORY_TRACT
  Filled 2014-01-18 (×2): qty 3

## 2014-01-18 MED ORDER — ATORVASTATIN CALCIUM 40 MG PO TABS
40.0000 mg | ORAL_TABLET | Freq: Every day | ORAL | Status: DC
  Filled 2014-01-18: qty 1

## 2014-01-18 MED ORDER — ALBUTEROL SULFATE (2.5 MG/3ML) 0.083% IN NEBU
5.0000 mg | INHALATION_SOLUTION | Freq: Once | RESPIRATORY_TRACT | Status: AC
  Administered 2014-01-18: 5 mg via RESPIRATORY_TRACT
  Filled 2014-01-18: qty 6

## 2014-01-18 MED ORDER — NITROGLYCERIN 0.4 MG SL SUBL: 0.4000 mg | SUBLINGUAL_TABLET | SUBLINGUAL | Status: DC | PRN

## 2014-01-18 MED ORDER — ADULT MULTIVITAMIN W/MINERALS CH
1.0000 | ORAL_TABLET | Freq: Every day | ORAL | Status: DC
  Administered 2014-01-19: 1 via ORAL
  Filled 2014-01-18: qty 1

## 2014-01-18 MED ORDER — ASPIRIN EC 81 MG PO TBEC
81.0000 mg | DELAYED_RELEASE_TABLET | Freq: Every day | ORAL | Status: DC
  Administered 2014-01-18: 81 mg via ORAL
  Filled 2014-01-18 (×2): qty 1

## 2014-01-18 MED ORDER — ALBUTEROL SULFATE (2.5 MG/3ML) 0.083% IN NEBU: 5.0000 mg | INHALATION_SOLUTION | RESPIRATORY_TRACT | Status: DC | PRN

## 2014-01-18 MED ORDER — PANTOPRAZOLE SODIUM 40 MG PO TBEC
40.0000 mg | DELAYED_RELEASE_TABLET | Freq: Every day | ORAL | Status: DC
  Administered 2014-01-19: 40 mg via ORAL
  Filled 2014-01-18: qty 1

## 2014-01-18 MED ORDER — AZITHROMYCIN 250 MG PO TABS
250.0000 mg | ORAL_TABLET | Freq: Every day | ORAL | Status: DC
  Administered 2014-01-19: 250 mg via ORAL
  Filled 2014-01-18: qty 1

## 2014-01-18 MED ORDER — FLUTICASONE PROPIONATE 50 MCG/ACT NA SUSP
1.0000 | Freq: Every day | NASAL | Status: DC | PRN
  Filled 2014-01-18: qty 16

## 2014-01-18 NOTE — Progress Notes (Signed)
  CARE MANAGEMENT ED NOTE 01/18/2014  Patient:  [name], [name]   Account Number:  [id number]  Date Initiated:  01/18/2014  Documentation initiated by:  Livia Snellen  Subjective/Objective Assessment:   Patient presents to Ed with worsening shortness of breath and wheezing     Subjective/Objective Assessment Detail:   Patient with pmhx of asthma, CAD, DM, HTN.  Pulse ox on room air while ambulating 88%     Action/Plan:   Action/Plan Detail:   Anticipated DC Date:       Status Recommendation to Physician:   Result of Recommendation:    Other ED Services  Consult [facility] [place]  Other    Choice offered to / List presented to:  C-4 Adult Children          Status of service:  Completed, signed off  ED Comments:   ED Comments Detail:  EDCM spoke to patient and her son at bedside.  Patient lives at home alone.  Patient's son Delfino Lovett checks in on and assists patient occassionally.  Patient's brother lives around the corner from her.  Patient does not have any home health services at this time.  Patient has a cane, walker, bedside commode, a lift chair, and a raised toilet seat at home.  Patient's son reports, some neighbors call and check in on the patient.  Patient reports she worked at [employer] for twenty years on the [employer]. Patient confirms her pcp is Dr. [name].  Patient reports it is getting more difficult to do things on her own at home and, "I could use a little help.  How do I go about getting help?"  [facility] provided patient with a list of home health agencies in [facility], list of private duty nursing agencies, printed information regarding the senior resources of [place] and printed information regarding the [facility].  EDCM explained with home health services she  may receive a visiting RN, PT, OT, aide and social worker if needed.  EDCM also explained that private duty nursing services would be an out of pocket expense for her.   Patient verbalized understanding.  Dr. [name] is a participant in the [facility] and patient is agreeable to referral.  [facility] placed [facility] referral.  Patient very thankful for assistance.  All printed information given to patinet's son Delfino Lovett.  No further EDCM needs at this time.

## 2014-01-18 NOTE — H&P (Signed)
[facility] History and Physical  [name] LOV:[id number] DOB: 11/20/1924 DOA: 01/18/2014  Referring physician: ER physician. PCP: Annette Gravel, MD   Chief Complaint: Shortness of breath.  HPI: [name] is a 80 y.o. female with history of bronchial asthma, diabetes mellitus, CAD, atrial fibrillation, hypothyroidism has been experiencing shortness of breath with nonproductive cough for last one week. Denies any chest pain or exertional symptoms. In the ER patient was found to be wheezing. Chest x-ray does not show anything acute. Patient's symptoms improved with albuterol but still has mild wheezing and has been admitted for bronchial asthma exacerbation. Patient otherwise denies any nausea vomiting abdominal pain diarrhea.   Review of Systems: As presented in the history of presenting illness, rest negative.  Past Medical History  Diagnosis Date  . Asthma   . Coronary artery disease   . Diabetes mellitus without complication   . Hypothyroidism   . GERD (gastroesophageal reflux disease)   . Insomnia   . Anxiety   . Hypertension   . CAD (coronary artery disease)   . Osteopenia   . Osteoarthritis   . H/O echocardiogram 06-26-2012    EF 55%  . H/O cardiovascular stress test 06-26-2012    compared to previous study there is no significant change; normal myocardial perfusion study. no significant ischemia demonstrated . this is a low risk scan  . History of cardiac cath 09-30-1998    cath done by Dr. Claiborne Billings, successful rotation atherectomy of the left anterior decending artert with reduction from 80 to105  . Hx of cardiac cath 09-26-1998    [facility] cath dr. Claiborne Billings to do rotoblator at a later date  . History of Doppler ultrasound 03-30-2008    neg abd. doppler for aneurysm  . History of Holter monitoring 12-22-2007    NSR with pac occs. pvc's   Past Surgical History  Procedure Laterality Date  . Appendectomy  1947  . Dilation and curettage of uterus  1960  . Vesicovaginal  fistula closure w/ tah  1971    Dr Mallie Mussel  . Bladder tack  1970    Dr Rosana Hoes  . Breast lumpectomy  1970    Left, Dr Leandrew Koyanagi  . Nasal sinus surgery  1980    Left; for chronic sinusitis  . Revision total knee arthroplasty  2005    Dr Alvan Dame Right knee   Social History:  reports that she has never smoked. She has never used smokeless tobacco. She reports that she does not drink alcohol or use illicit drugs. Where does patient live home. Can patient participate in ADLs? Yes.  No Known Allergies  Family History:  Family History  Problem Relation Age of Onset  . Diabetes Father   . Parkinson's disease Father   . Heart disease Father   . Asthma Father   . Diabetes Mother   . Heart disease Mother   . Heart disease Brother   . Diabetes Brother   . Diabetes Brother   . Diabetes Brother       Prior to Admission medications   Medication Sig Start Date End Date Taking? Authorizing Provider  albuterol (PROVENTIL HFA;VENTOLIN HFA) 108 (90 BASE) MCG/ACT inhaler Inhale 2 puffs into the lungs every 6 (six) hours as needed. For shortness of breath   Yes Historical Provider, MD  aspirin EC 81 MG tablet Take 81 mg by mouth at bedtime.    Yes Historical Provider, MD  azelastine (ASTELIN) 137 MCG/SPRAY nasal spray Place 1 spray into the nose  2 (two) times daily. Use in each nostril as directed   Yes Historical Provider, MD  clopidogrel (PLAVIX) 75 MG tablet Take 75 mg by mouth daily.   Yes Historical Provider, MD  clorazepate (TRANXENE) 15 MG tablet Take 15 mg by mouth at bedtime. anxiety   Yes Historical Provider, MD  ezetimibe (ZETIA) 10 MG tablet Take 10 mg by mouth at bedtime.   Yes Historical Provider, MD  FLUoxetine (PROZAC) 10 MG capsule Take 10 mg by mouth daily.   Yes Historical Provider, MD  fluticasone (FLONASE) 50 MCG/ACT nasal spray Place 1 spray into both nostrils daily as needed for allergies or rhinitis.   Yes Historical Provider, MD  glimepiride (AMARYL) 1 MG tablet Take 1 mg by mouth  daily before breakfast.   Yes Historical Provider, MD  levothyroxine (SYNTHROID, LEVOTHROID) 50 MCG tablet Take 25 mcg by mouth daily.    Yes Historical Provider, MD  LORazepam (ATIVAN) 1 MG tablet Take 1 mg by mouth every 8 (eight) hours as needed for anxiety.   Yes Historical Provider, MD  metoprolol succinate (TOPROL-XL) 50 MG 24 hr tablet Take 2 tablets (100 mg total) by mouth daily. Take with or immediately following a meal. 11/02/13  Yes Troy Sine, MD  MICARDIS HCT 80-25 MG per tablet Take 1 tablet by mouth daily. 08/01/12  Yes Historical Provider, MD  montelukast (SINGULAIR) 10 MG tablet Take 10 mg by mouth at bedtime.   Yes Historical Provider, MD  Multiple Vitamin (MULTIVITAMIN WITH MINERALS) TABS tablet Take 1 tablet by mouth daily.   Yes Historical Provider, MD  nitroGLYCERIN (NITROSTAT) 0.4 MG SL tablet Place 0.4 mg under the tongue every 5 (five) minutes as needed. For chest pain   Yes Historical Provider, MD  omeprazole (PRILOSEC) 20 MG capsule Take 20 mg by mouth daily.   Yes Historical Provider, MD  QUEtiapine (SEROQUEL) 25 MG tablet Take 50 mg by mouth daily with breakfast.   Yes Historical Provider, MD  rosuvastatin (CRESTOR) 20 MG tablet Take 20 mg by mouth at bedtime.    Yes Historical Provider, MD  saccharomyces boulardii (FLORASTOR) 250 MG capsule Take 250 mg by mouth 2 (two) times daily.   Yes Historical Provider, MD  topiramate (TOPAMAX) 25 MG tablet Take 25 mg by mouth at bedtime.   Yes Historical Provider, MD    Physical Exam: Filed Vitals:   01/18/14 1712 01/18/14 1908 01/18/14 1930 01/18/14 2046  BP:  114/48 126/66 99/57  Pulse:  73 76 72  Temp:      TempSrc:      Resp:  16 18 18   SpO2: 88% 93% 93% 95%     General:  Well-developed and nourished.  Eyes: Anicteric no pallor.  ENT: No discharge from the ears eyes nose mouth.  Neck: No mass felt.  Cardiovascular: S1-S2 heard.  Respiratory: Mild expiratory wheeze heard no crepitations.  Abdomen: Soft  nontender bowel sounds present. No guarding rigidity.  Skin: No rash.  Musculoskeletal: No edema.  Psychiatric: Appears normal.  Neurologic: Alert awake oriented to time place and person. Moves all extremities.  Labs on Admission:  Basic Metabolic Panel:  Recent Labs Lab 01/18/14 1449  NA 140  K 4.2  CL 103  CO2 25  GLUCOSE 87  BUN 36*  CREATININE 0.90  CALCIUM 9.8   Liver Function Tests: No results found for this basename: AST, ALT, ALKPHOS, BILITOT, PROT, ALBUMIN,  in the last 168 hours No results found for this basename: LIPASE, AMYLASE,  in the last 168 hours No results found for this basename: AMMONIA,  in the last 168 hours CBC:  Recent Labs Lab 01/18/14 1449  WBC 7.7  HGB 12.1  HCT 37.5  MCV 81.7  PLT 208   Cardiac Enzymes:  Recent Labs Lab 01/18/14 1513  TROPONINI <0.30    BNP (last 3 results)  Recent Labs  01/18/14 1512  PROBNP 250.3   CBG: No results found for this basename: GLUCAP,  in the last 168 hours  Radiological Exams on Admission: Dg Chest 2 View (if Patient Has Fever And/or Copd)  01/18/2014   CLINICAL DATA:  Cough and congestion  EXAM: CHEST  2 VIEW  COMPARISON:  08/03/2013  FINDINGS: The heart size and mediastinal contours are within normal limits. Both lungs are clear. The visualized skeletal structures are unremarkable.  IMPRESSION: No active cardiopulmonary disease.   Electronically Signed   By: Inez Catalina M.D.   On: 01/18/2014 15:01    EKG: Independently reviewed. Normal sinus rhythm.  Assessment/Plan Principal Problem:   Asthma exacerbation Active Problems:   DIABETES MELLITUS   CAD- HSRA LAD 2000, low risk Myoview 11/13   HTN (hypertension)   HLD (hyperlipidemia)   1. Bronchial asthma exacerbation - patient had received one dose of prednisone in the ER. I have placed patient on Pulmicort and albuterol nebulizer further doses of steroids to be decided based on patient's response. Patient is on  Zithromax. 2. Diabetes mellitus - continue home medications and closely follow CBGs since patient has received prednisone. 3. CAD - denies any chest pain. 4. Atrial fibrillation - presently in sinus rhythm. 5. Hyperlipidemia - continue present medications. 6. Hypertension - continue present medications. 7. Hypothyroidism - on Synthroid.    Code Status: Full code.  Family Communication: Patient's son at the bedside.  Disposition Plan: Admit to inpatient.    [facility] Pager [phone].  If 7PM-7AM, please contact night-coverage [email or web address] Password [facility] 01/18/2014, 8:49 PM

## 2014-01-18 NOTE — ED Notes (Signed)
Pt states that she has asthma and last Monday she started having issues with it. Pt been taking her meds but not helping with the congestion. Pt was admitted here in December for similar thing.

## 2014-01-18 NOTE — ED Notes (Signed)
Patient transported to X-ray 

## 2014-01-18 NOTE — ED Notes (Signed)
Pt still unable to urinate, gave some water, and advised pt she still was not allowed food, pt states she is diabetic, and is afraid her sugar will drop.

## 2014-01-18 NOTE — ED Notes (Signed)
Pt had provided urine specimen, but unfortunately sample was dumped from hat, when I returned to retrieve specimen. Pt unable to urinate now due to empty bladder.

## 2014-01-18 NOTE — ED Notes (Signed)
MD at bedside. 

## 2014-01-18 NOTE — ED Provider Notes (Signed)
CSN: [id number]     Arrival date & time 01/18/14  1340 History   First MD Initiated Contact with Patient 01/18/14 1500     Chief Complaint  Patient presents with  . Asthma     (Consider location/radiation/quality/duration/timing/severity/associated sxs/prior Treatment) HPI Patient reports she has a history of asthma. She reports for the past week she's been having worsening shortness of breath and wheezing. She states she has a cough with mucus however she is unable to cough it up. She denies any fever or chills. She reports dyspnea on exertion. She denies any pain or swelling in her lower extremities. She states she has a sore throat when she coughs. She has white rhinorrhea. She denies sneezing. She states her nebulizers are not helping. She does not use oxygen at home. She states she has chest tightness without chest pain. She does not recall being on steroids recently. She relates the last time she was admitted for several years ago for pneumonia. She states she's never had to be on a ventilator.  PCP Dr Maudie Mercury Pulmonary Dr Annamaria Boots years ago Cardiologist Dr Claiborne Billings  Past Medical History  Diagnosis Date  . Asthma   . Coronary artery disease   . Diabetes mellitus without complication   . Hypothyroidism   . GERD (gastroesophageal reflux disease)   . Insomnia   . Anxiety   . Hypertension   . CAD (coronary artery disease)   . Osteopenia   . Osteoarthritis   . H/O echocardiogram 06-26-2012    EF 55%  . H/O cardiovascular stress test 06-26-2012    compared to previous study there is no significant change; normal myocardial perfusion study. no significant ischemia demonstrated . this is a low risk scan  . History of cardiac cath 09-30-1998    cath done by Dr. Claiborne Billings, successful rotation atherectomy of the left anterior decending artert with reduction from 80 to105  . Hx of cardiac cath 09-26-1998    [facility] cath dr. Claiborne Billings to do rotoblator at a later date  . History of Doppler ultrasound  03-30-2008    neg abd. doppler for aneurysm  . History of Holter monitoring 12-22-2007    NSR with pac occs. pvc's   Past Surgical History  Procedure Laterality Date  . Appendectomy  1947  . Dilation and curettage of uterus  1960  . Vesicovaginal fistula closure w/ tah  1971    Dr Mallie Mussel  . Bladder tack  1970    Dr Rosana Hoes  . Breast lumpectomy  1970    Left, Dr Leandrew Koyanagi  . Nasal sinus surgery  1980    Left; for chronic sinusitis  . Revision total knee arthroplasty  2005    Dr Alvan Dame Right knee   Family History  Problem Relation Age of Onset  . Diabetes Father   . Parkinson's disease Father   . Heart disease Father   . Asthma Father   . Diabetes Mother   . Heart disease Mother   . Heart disease Brother   . Diabetes Brother   . Diabetes Brother   . Diabetes Brother    History  Substance Use Topics  . Smoking status: Never Smoker   . Smokeless tobacco: Never Used  . Alcohol Use: No   Lives at home Lives alone Uses a cane  OB History   Grav Para Term Preterm Abortions TAB SAB Ect Mult Living                 Review of Systems  All other systems reviewed and are negative.     Allergies  Review of patient's allergies indicates no known allergies.  Home Medications   Prior to Admission medications   Medication Sig Start Date End Date Taking? Authorizing Provider  albuterol (PROVENTIL HFA;VENTOLIN HFA) 108 (90 BASE) MCG/ACT inhaler Inhale 2 puffs into the lungs every 6 (six) hours as needed. For shortness of breath   Yes Historical Provider, MD  aspirin EC 81 MG tablet Take 81 mg by mouth at bedtime.    Yes Historical Provider, MD  azelastine (ASTELIN) 137 MCG/SPRAY nasal spray Place 1 spray into the nose 2 (two) times daily. Use in each nostril as directed   Yes Historical Provider, MD  clopidogrel (PLAVIX) 75 MG tablet Take 75 mg by mouth daily.   Yes Historical Provider, MD  clorazepate (TRANXENE) 15 MG tablet Take 15 mg by mouth at bedtime. anxiety   Yes Historical  Provider, MD  ezetimibe (ZETIA) 10 MG tablet Take 10 mg by mouth at bedtime.   Yes Historical Provider, MD  FLUoxetine (PROZAC) 10 MG capsule Take 10 mg by mouth daily.   Yes Historical Provider, MD  fluticasone (FLONASE) 50 MCG/ACT nasal spray Place 1 spray into both nostrils daily as needed for allergies or rhinitis.   Yes Historical Provider, MD  glimepiride (AMARYL) 1 MG tablet Take 1 mg by mouth daily before breakfast.   Yes Historical Provider, MD  levothyroxine (SYNTHROID, LEVOTHROID) 50 MCG tablet Take 25 mcg by mouth daily.    Yes Historical Provider, MD  LORazepam (ATIVAN) 1 MG tablet Take 1 mg by mouth every 8 (eight) hours as needed for anxiety.   Yes Historical Provider, MD  metoprolol succinate (TOPROL-XL) 50 MG 24 hr tablet Take 2 tablets (100 mg total) by mouth daily. Take with or immediately following a meal. 11/02/13  Yes Troy Sine, MD  MICARDIS HCT 80-25 MG per tablet Take 1 tablet by mouth daily. 08/01/12  Yes Historical Provider, MD  montelukast (SINGULAIR) 10 MG tablet Take 10 mg by mouth at bedtime.   Yes Historical Provider, MD  Multiple Vitamin (MULTIVITAMIN WITH MINERALS) TABS tablet Take 1 tablet by mouth daily.   Yes Historical Provider, MD  nitroGLYCERIN (NITROSTAT) 0.4 MG SL tablet Place 0.4 mg under the tongue every 5 (five) minutes as needed. For chest pain   Yes Historical Provider, MD  omeprazole (PRILOSEC) 20 MG capsule Take 20 mg by mouth daily.   Yes Historical Provider, MD  QUEtiapine (SEROQUEL) 25 MG tablet Take 50 mg by mouth daily with breakfast.   Yes Historical Provider, MD  rosuvastatin (CRESTOR) 20 MG tablet Take 20 mg by mouth at bedtime.    Yes Historical Provider, MD  saccharomyces boulardii (FLORASTOR) 250 MG capsule Take 250 mg by mouth 2 (two) times daily.   Yes Historical Provider, MD  topiramate (TOPAMAX) 25 MG tablet Take 25 mg by mouth at bedtime.   Yes Historical Provider, MD   BP 91/75  Pulse 73  Temp(Src) 99.8 F (37.7 C) (Oral)   Resp 18  SpO2 95%  Vital signs normal except hypotension  Physical Exam  Nursing note and vitals reviewed. Constitutional: She is oriented to person, place, and time. She appears well-developed and well-nourished.  Non-toxic appearance. She does not appear ill. No distress.  HENT:  Head: Normocephalic and atraumatic.  Right Ear: External ear normal.  Left Ear: External ear normal.  Nose: Nose normal. No mucosal edema or rhinorrhea.  Mouth/Throat: Oropharynx is clear and  moist and mucous membranes are normal. No dental abscesses or uvula swelling.  Eyes: Conjunctivae and EOM are normal. Pupils are equal, round, and reactive to light.  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 Neck supple.  Cardiovascular: Normal rate, regular rhythm and normal heart sounds.  Exam reveals no gallop and no friction rub.   No murmur heard. Pulmonary/Chest: Effort normal and breath sounds normal. No respiratory distress. She has no wheezes. She has no rhonchi. She has no rales. She exhibits no tenderness and no crepitus.  Patient has diffuse diminished breath sounds. She has faint end expiratory wheezing that is diffuse  Abdominal: Soft. Normal appearance and bowel sounds are normal. She exhibits no distension. There is no tenderness. There is no rebound and no guarding.  Musculoskeletal: Normal range of motion. She exhibits no edema and no tenderness.  Moves all extremities well.   Neurological: She is alert and oriented to person, place, and time. She has normal strength. No cranial nerve deficit.  Skin: Skin is warm, dry and intact. No rash noted. No erythema. No pallor.  Psychiatric: She has a normal mood and affect. Her speech is normal and behavior is normal. Her mood appears not anxious.    ED Course  Procedures (including critical care time)  Medications  azithromycin  (ZITHROMAX) tablet 500 mg (500 mg Oral Given 01/18/14 1910)    Followed by  azithromycin (ZITHROMAX) tablet 250 mg (not administered)  albuterol (PROVENTIL) (2.5 MG/3ML) 0.083% nebulizer solution 5 mg (5 mg Nebulization Given 01/18/14 1541)  ipratropium (ATROVENT) nebulizer solution 0.5 mg (0.5 mg Nebulization Given 01/18/14 1542)  predniSONE (DELTASONE) tablet 60 mg (60 mg Oral Given 01/18/14 1528)  ipratropium (ATROVENT) nebulizer solution 0.5 mg (0.5 mg Nebulization Given 01/18/14 1736)  albuterol (PROVENTIL) (2.5 MG/3ML) 0.083% nebulizer solution 5 mg (5 mg Nebulization Given 01/18/14 1736)    16:45 Recheck, feeling better, has end faint expir wheezing then starts coughing and gets audible wheezing.   17:10Nursing staff states when patient ambulated to the bathroom her pulse ox dropped to 88% both going to the bathroom and coming back from the bathroom to her room. Pt reports it made her feel more SOB.   20:34 Dr Hal Hope admit to med-surg, team 8  Labs Review Results for orders placed during the hospital encounter of [id number]  BASIC METABOLIC PANEL      Result Value Ref Range   Sodium 140  137 - 147 mEq/L   Potassium 4.2  3.7 - 5.3 mEq/L   Chloride 103  96 - 112 mEq/L   CO2 25  19 - 32 mEq/L   Glucose, Bld 87  70 - 99 mg/dL   BUN 36 (*) 6 - 23 mg/dL   Creatinine, Ser 0.90  0.50 - 1.10 mg/dL   Calcium 9.8  8.4 - 10.5 mg/dL   GFR calc non Af Amer 56 (*) >90 mL/min   GFR calc Af Amer 65 (*) >90 mL/min  CBC      Result Value Ref Range   WBC 7.7  4.0 - 10.5 K/uL   RBC 4.59  3.87 - 5.11 MIL/uL   Hemoglobin 12.1  12.0 - 15.0 g/dL   HCT 37.5  36.0 - 46.0 %   MCV 81.7  78.0 - 100.0 fL   MCH 26.4  26.0 - 34.0 pg   MCHC 32.3  30.0 -  36.0 g/dL   RDW 14.0  11.5 - 15.5 %   Platelets 208  150 - 400 K/uL  PRO B NATRIURETIC PEPTIDE      Result Value Ref Range   Pro B Natriuretic peptide (BNP) 250.3  0 - 450 pg/mL  TROPONIN I      Result Value Ref Range   Troponin I <0.30  <0.30 ng/mL    Laboratory interpretation all normal except elevated BUN   Imaging Review Dg Chest 2 View (if Patient Has Fever And/or Copd)  01/18/2014   CLINICAL DATA:  Cough and congestion  EXAM: CHEST  2 VIEW  COMPARISON:  08/03/2013  FINDINGS: The heart size and mediastinal contours are within normal limits. Both lungs are clear. The visualized skeletal structures are unremarkable.  IMPRESSION: No active cardiopulmonary disease.   Electronically Signed   By: Inez Catalina M.D.   On: 01/18/2014 15:01     EKG Interpretation   Date/Time:  Monday January 18 2014 14:19:07 EDT Ventricular Rate:  74 PR Interval:  141 QRS Duration: 92 QT Interval:  385 QTC Calculation: 427 R Axis:   21 Text Interpretation:  Sinus rhythm Anteroseptal infarct, old Borderline  repolarization abnormality No significant change since last tracing 04 Aug 2013 Confirmed by Catrina Fellenz  MD-I, Hanz Winterhalter ([id number]) on 01/18/2014 4:28:06 PM      MDM   Final diagnoses:  Asthma with bronchitis  Hypoxia  DOE (dyspnea on exertion)   Plan admission  Rolland Porter, MD, Alanson Aly, MD 01/18/14 [phone]

## 2014-01-19 DIAGNOSIS — J45909 Unspecified asthma, uncomplicated: Secondary | ICD-10-CM | POA: Diagnosis not present

## 2014-01-19 LAB — BASIC METABOLIC PANEL
BUN: 39 mg/dL — ABNORMAL HIGH (ref 6–23)
CHLORIDE: 101 meq/L (ref 96–112)
CO2: 24 mEq/L (ref 19–32)
CREATININE: 0.9 mg/dL (ref 0.50–1.10)
Calcium: 9.3 mg/dL (ref 8.4–10.5)
GFR calc Af Amer: 65 mL/min — ABNORMAL LOW (ref >90)
GFR calc non Af Amer: 56 mL/min — ABNORMAL LOW (ref >90)
GLUCOSE: 156 mg/dL — AB (ref 70–99)
Potassium: 4.8 mEq/L (ref 3.7–5.3)
Sodium: 137 mEq/L (ref 137–147)

## 2014-01-19 LAB — CBC
HEMATOCRIT: 38.1 % (ref 36.0–46.0)
HEMOGLOBIN: 12.3 g/dL (ref 12.0–15.0)
MCH: 26.6 pg (ref 26.0–34.0)
MCHC: 32.3 g/dL (ref 30.0–36.0)
MCV: 82.5 fL (ref 78.0–100.0)
Platelets: 209 10*3/uL (ref 150–400)
RBC: 4.62 MIL/uL (ref 3.87–5.11)
RDW: 14.2 % (ref 11.5–15.5)
WBC: 5.7 10*3/uL (ref 4.0–10.5)

## 2014-01-19 LAB — GLUCOSE, CAPILLARY
Glucose-Capillary: 223 mg/dL — ABNORMAL HIGH (ref 70–99)
Glucose-Capillary: 97 mg/dL (ref 70–99)
Glucose-Capillary: 98 mg/dL (ref 70–99)

## 2014-01-19 MED ORDER — PREDNISONE 50 MG PO TABS: ORAL_TABLET | ORAL | Status: DC

## 2014-01-19 MED ORDER — ALBUTEROL SULFATE (2.5 MG/3ML) 0.083% IN NEBU: 2.5000 mg | INHALATION_SOLUTION | Freq: Four times a day (QID) | RESPIRATORY_TRACT | Status: DC | PRN

## 2014-01-19 MED ORDER — FLUTICASONE PROPIONATE HFA 44 MCG/ACT IN AERO: 2.0000 | INHALATION_SPRAY | Freq: Two times a day (BID) | RESPIRATORY_TRACT | Status: DC

## 2014-01-19 NOTE — Progress Notes (Addendum)
Received referral for [facility] Care Management services from ED RNCM. Met with patient and son. Explained [facility] Care Management services Consents obtained. She will receive post hospital discharge calls and will be evaluated for monthly home visits. Confirmed best contact number and left [facility] Care Management packet at bedside. Appreciative of visit.  [facility] [phone]

## 2014-01-19 NOTE — Discharge Summary (Signed)
Physician Discharge Summary  [name] AOZ:[id number] DOB: 1925/02/14 DOA: 01/18/2014  PCP: [name], MD  Admit date: 01/18/2014 Discharge date: 01/19/2014  Time spent: > 35 minutes  Recommendations for Outpatient Follow-up:  1. Added flovent and short course of prednisone  Discharge Diagnoses:   Discharge Condition: stable  Diet recommendation: diabetic diet  Filed Weights   01/18/14 2233  Weight: 84.142 kg (185 lb 8 oz)    History of present illness:  Pt is a 80 y/o with history of bronchial asthma, diabetes mellitus, CAD, atrial fibrillation, hypothyroidism has been experiencing shortness of breath with nonproductive cough for last one week.     Hospital Course:  Asthma exacerbation - add flovent - pt breathing comfortably on room air - may continue albuterol q 4 hours prn sob or wheezes - not convinced patient has active bacterial lung infection given abscence of fevers, normal WBC levels and non productive cough as such will not prescribe abx on discharge.  For other known medical conditions will continue home regimen  Consultations:  None  Discharge Exam: Filed Vitals:   01/19/14 1032  BP: 135/70  Pulse: 63  Temp:   Resp:     General: Pt in NAD, alert and awake Cardiovascular: normal s1 and s2, no murmurs Respiratory: no audible wheezes, no increased wob on room air, breath sounds BL  Discharge Instructions You were cared for by a hospitalist during your hospital stay. If you have any questions about your discharge medications or the care you received while you were in the hospital after you are discharged, you can call the unit and asked to speak with the hospitalist on call if the hospitalist that took care of you is not available. Once you are discharged, your primary care physician will handle any further medical issues. Please note that NO REFILLS for any discharge medications will be authorized once you are discharged, as it is imperative that you  return to your primary care physician (or establish a relationship with a primary care physician if you do not have one) for your aftercare needs so that they can reassess your need for medications and monitor your lab values.  Discharge Instructions   Call MD for:  difficulty breathing, headache or visual disturbances    Complete by:  As directed      Call MD for:  temperature >100.4    Complete by:  As directed      Diet - low sodium heart healthy    Complete by:  As directed      Discharge instructions    Complete by:  As directed   Please f/u with your primary care physician in 1-2 weeks or sooner should any new concerns arise.     Increase activity slowly    Complete by:  As directed             Medication List    STOP taking these medications       albuterol 108 (90 BASE) MCG/ACT inhaler  Commonly known as:  PROVENTIL HFA;VENTOLIN HFA  Replaced by:  albuterol (2.5 MG/3ML) 0.083% nebulizer solution      TAKE these medications       albuterol (2.5 MG/3ML) 0.083% nebulizer solution  Commonly known as:  PROVENTIL  Take 3 mLs (2.5 mg total) by nebulization every 6 (six) hours as needed for wheezing.     aspirin EC 81 MG tablet  Take 81 mg by mouth at bedtime.     azelastine 0.1 % nasal  spray  Commonly known as:  ASTELIN  Place 1 spray into the nose 2 (two) times daily. Use in each nostril as directed     clopidogrel 75 MG tablet  Commonly known as:  PLAVIX  Take 75 mg by mouth daily.     clorazepate 15 MG tablet  Commonly known as:  TRANXENE  Take 15 mg by mouth at bedtime. anxiety     ezetimibe 10 MG tablet  Commonly known as:  ZETIA  Take 10 mg by mouth at bedtime.     FLUoxetine 10 MG capsule  Commonly known as:  PROZAC  Take 10 mg by mouth daily.     fluticasone 44 MCG/ACT inhaler  Commonly known as:  FLOVENT HFA  Inhale 2 puffs into the lungs 2 (two) times daily.     fluticasone 50 MCG/ACT nasal spray  Commonly known as:  FLONASE  Place 1 spray into  both nostrils daily as needed for allergies or rhinitis.     glimepiride 1 MG tablet  Commonly known as:  AMARYL  Take 1 mg by mouth daily before breakfast.     levothyroxine 50 MCG tablet  Commonly known as:  SYNTHROID, LEVOTHROID  Take 25 mcg by mouth daily.     LORazepam 1 MG tablet  Commonly known as:  ATIVAN  Take 1 mg by mouth every 8 (eight) hours as needed for anxiety.     metoprolol succinate 50 MG 24 hr tablet  Commonly known as:  TOPROL-XL  Take 2 tablets (100 mg total) by mouth daily. Take with or immediately following a meal.     MICARDIS HCT 80-25 MG per tablet  Generic drug:  telmisartan-hydrochlorothiazide  Take 1 tablet by mouth daily.     montelukast 10 MG tablet  Commonly known as:  SINGULAIR  Take 10 mg by mouth at bedtime.     multivitamin with minerals Tabs tablet  Take 1 tablet by mouth daily.     nitroGLYCERIN 0.4 MG SL tablet  Commonly known as:  NITROSTAT  Place 0.4 mg under the tongue every 5 (five) minutes as needed. For chest pain     omeprazole 20 MG capsule  Commonly known as:  PRILOSEC  Take 20 mg by mouth daily.     predniSONE 50 MG tablet  Commonly known as:  DELTASONE  Take one tab daily for the next 4 days     QUEtiapine 25 MG tablet  Commonly known as:  SEROQUEL  Take 50 mg by mouth daily with breakfast.     rosuvastatin 20 MG tablet  Commonly known as:  CRESTOR  Take 20 mg by mouth at bedtime.     saccharomyces boulardii 250 MG capsule  Commonly known as:  FLORASTOR  Take 250 mg by mouth 2 (two) times daily.     topiramate 25 MG tablet  Commonly known as:  TOPAMAX  Take 25 mg by mouth at bedtime.       No Known Allergies    The results of significant diagnostics from this hospitalization (including imaging, microbiology, ancillary and laboratory) are listed below for reference.    Significant Diagnostic Studies: Dg Chest 2 View (if Patient Has Fever And/or Copd)  01/18/2014   CLINICAL DATA:  Cough and congestion   EXAM: CHEST  2 VIEW  COMPARISON:  08/03/2013  FINDINGS: The heart size and mediastinal contours are within normal limits. Both lungs are clear. The visualized skeletal structures are unremarkable.  IMPRESSION: No active cardiopulmonary disease.  Electronically Signed   By: Inez Catalina M.D.   On: 01/18/2014 15:01    Microbiology: No results found for this or any previous visit (from the past 240 hour(s)).   Labs: Basic Metabolic Panel:  Recent Labs Lab 01/18/14 1449 01/19/14 0531  NA 140 137  K 4.2 4.8  CL 103 101  CO2 25 24  GLUCOSE 87 156*  BUN 36* 39*  CREATININE 0.90 0.90  CALCIUM 9.8 9.3   Liver Function Tests: No results found for this basename: AST, ALT, ALKPHOS, BILITOT, PROT, ALBUMIN,  in the last 168 hours No results found for this basename: LIPASE, AMYLASE,  in the last 168 hours No results found for this basename: AMMONIA,  in the last 168 hours CBC:  Recent Labs Lab 01/18/14 1449 01/19/14 0531  WBC 7.7 5.7  HGB 12.1 12.3  HCT 37.5 38.1  MCV 81.7 82.5  PLT 208 209   Cardiac Enzymes:  Recent Labs Lab 01/18/14 1513  TROPONINI <0.30   BNP: BNP (last 3 results)  Recent Labs  01/18/14 1512  PROBNP 250.3   CBG:  Recent Labs Lab 01/18/14 2121 01/19/14 0753 01/19/14 1140  GLUCAP 223* 97 98       Signed:  Gaius Ishaq  [facility] 01/19/2014, 2:07 PM

## 2014-01-19 NOTE — Care Management Note (Unsigned)
    Page 1 of 1   01/19/2014     2:55:50 PM CARE MANAGEMENT NOTE 01/19/2014  Patient:  [name], [name]   Account Number:  [id number]  Date Initiated:  01/19/2014  Documentation initiated by:  [facility]  Subjective/Objective Assessment:   80 year old female admitted with asthma exacerbation.     Action/Plan:   From home.   Anticipated DC Date:  01/22/2014   Anticipated DC Plan:  [place]  CM consult      Choice offered to / List presented to:             Status of service:  Completed, signed off Medicare Important Message given?   (If response is "NO", the following Medicare IM given date fields will be blank) Date Medicare IM given:   Date Additional Medicare IM given:    Discharge Disposition:  HOME/SELF CARE  Per UR Regulation:  Reviewed for med. necessity/level of care/duration of stay  If discussed at [place] of Stay Meetings, dates discussed:    Comments:

## 2014-01-19 NOTE — Progress Notes (Signed)
Patient verbalized understanding of discharge instructions. Patient is stable at discharge. Patient assessment has not changed from am. 

## 2014-01-19 NOTE — Progress Notes (Signed)
Clinical Social Work Department BRIEF PSYCHOSOCIAL ASSESSMENT 01/19/2014  Patient:  [name], [name]     Account Number:  [id number]     Admit date:  01/18/2014  Clinical Social Worker:  Earlie Server  Date/Time:  01/19/2014 02:30 PM  Referred by:  Physician  Date Referred:  01/19/2014 Referred for  Advanced Directives   Other Referral:   Interview type:  Patient Other interview type:    PSYCHOSOCIAL DATA Living Status:  ALONE Admitted from facility:   Level of care:   Primary support name:  Richard Primary support relationship to patient:  CHILD, ADULT Degree of support available:   Strong    CURRENT CONCERNS Current Concerns  Other - See comment   Other Concerns:   Advanced Directives    SOCIAL WORK ASSESSMENT / PLAN CSW received referral in order to assist with advanced directives. CSW reviewed chart and met with patient and son at bedside. CSW introduced myself and explained role.    Patient reports that she lives alone and is not married and is concerned about HCPOA and durable POA. CSW explained the difference between durable POA and HCPOA and family is more interested in durable POA but took advanced directive packet that CSW provided. CSW explained living will and HCPOA. Patient reports that she is not married and only has one son that makes decisions. CSW reported that if patient wanted to designate son as HCPOA then CSW would assist with completing forms today. Patient reports she is not feeling well and will review forms at home. CSW reminded patient to not sign forms until in front of witness and notary.    CSW provided CSW contact information and encouraged patient to call with any further questions. CSW is signing off but available if any further needs arise.   Assessment/plan status:  No Further Intervention Required Other assessment/ plan:   Information/referral to community resources:   Advanced directives    PATIENT'S/FAMILY'S RESPONSE TO PLAN OF  CARE: Patient alert and oriented. Patient reports that her son is very involved and assists and needed so she would want him to be her HCPOA. Son aware of durable POA needs and reports he will contact a Chief Executive Officer. Patient reports that she will review all information for advanced directives later this week because she does not want to feel rushed to complete forms. Patient thanked CSW for assistance but reports no further needs.       [place], [place] [phone]

## 2014-02-04 DIAGNOSIS — G894 Chronic pain syndrome: Secondary | ICD-10-CM | POA: Diagnosis not present

## 2014-02-04 DIAGNOSIS — M5137 Other intervertebral disc degeneration, lumbosacral region: Secondary | ICD-10-CM | POA: Diagnosis not present

## 2014-02-10 DIAGNOSIS — J45909 Unspecified asthma, uncomplicated: Secondary | ICD-10-CM | POA: Diagnosis not present

## 2014-03-19 DIAGNOSIS — L608 Other nail disorders: Secondary | ICD-10-CM | POA: Diagnosis not present

## 2014-03-19 DIAGNOSIS — L84 Corns and callosities: Secondary | ICD-10-CM | POA: Diagnosis not present

## 2014-03-19 DIAGNOSIS — I739 Peripheral vascular disease, unspecified: Secondary | ICD-10-CM | POA: Diagnosis not present

## 2014-03-19 DIAGNOSIS — E1159 Type 2 diabetes mellitus with other circulatory complications: Secondary | ICD-10-CM | POA: Diagnosis not present

## 2014-04-14 DIAGNOSIS — H35379 Puckering of macula, unspecified eye: Secondary | ICD-10-CM | POA: Diagnosis not present

## 2014-04-29 DIAGNOSIS — G894 Chronic pain syndrome: Secondary | ICD-10-CM | POA: Diagnosis not present

## 2014-04-29 DIAGNOSIS — M5137 Other intervertebral disc degeneration, lumbosacral region: Secondary | ICD-10-CM | POA: Diagnosis not present

## 2014-04-30 ENCOUNTER — Telehealth: Payer: Self-pay | Admitting: Cardiovascular Disease

## 2014-04-30 NOTE — Telephone Encounter (Signed)
Dr. Martinique OK'd for patient to HOLD Plavix x 5 days prior to spinal injections.  Order faxed [phone].

## 2014-04-30 NOTE — Telephone Encounter (Signed)
[name] called in needing medical clearance for [name], stating that she will be receiving an injection next Friday and will need to come off of plavix 3 days prior. Does she need to be seen to receive clearance? Please call  Thanks

## 2014-05-07 DIAGNOSIS — M545 Low back pain, unspecified: Secondary | ICD-10-CM | POA: Diagnosis not present

## 2014-05-07 DIAGNOSIS — M47817 Spondylosis without myelopathy or radiculopathy, lumbosacral region: Secondary | ICD-10-CM | POA: Diagnosis not present

## 2014-05-07 DIAGNOSIS — G894 Chronic pain syndrome: Secondary | ICD-10-CM | POA: Diagnosis not present

## 2014-06-03 DIAGNOSIS — I739 Peripheral vascular disease, unspecified: Secondary | ICD-10-CM | POA: Diagnosis not present

## 2014-06-03 DIAGNOSIS — L603 Nail dystrophy: Secondary | ICD-10-CM | POA: Diagnosis not present

## 2014-06-03 DIAGNOSIS — E1151 Type 2 diabetes mellitus with diabetic peripheral angiopathy without gangrene: Secondary | ICD-10-CM | POA: Diagnosis not present

## 2014-06-03 DIAGNOSIS — L84 Corns and callosities: Secondary | ICD-10-CM | POA: Diagnosis not present

## 2014-06-10 DIAGNOSIS — I10 Essential (primary) hypertension: Secondary | ICD-10-CM | POA: Diagnosis not present

## 2014-06-10 DIAGNOSIS — E039 Hypothyroidism, unspecified: Secondary | ICD-10-CM | POA: Diagnosis not present

## 2014-06-10 DIAGNOSIS — E119 Type 2 diabetes mellitus without complications: Secondary | ICD-10-CM | POA: Diagnosis not present

## 2014-06-23 DIAGNOSIS — E780 Pure hypercholesterolemia: Secondary | ICD-10-CM | POA: Diagnosis not present

## 2014-06-23 DIAGNOSIS — I10 Essential (primary) hypertension: Secondary | ICD-10-CM | POA: Diagnosis not present

## 2014-06-23 DIAGNOSIS — E119 Type 2 diabetes mellitus without complications: Secondary | ICD-10-CM | POA: Diagnosis not present

## 2014-06-23 DIAGNOSIS — F419 Anxiety disorder, unspecified: Secondary | ICD-10-CM | POA: Diagnosis not present

## 2014-07-01 DIAGNOSIS — R140 Abdominal distension (gaseous): Secondary | ICD-10-CM | POA: Diagnosis not present

## 2014-07-01 DIAGNOSIS — R110 Nausea: Secondary | ICD-10-CM | POA: Diagnosis not present

## 2014-07-01 DIAGNOSIS — R194 Change in bowel habit: Secondary | ICD-10-CM | POA: Diagnosis not present

## 2014-07-13 ENCOUNTER — Ambulatory Visit: Payer: Self-pay | Admitting: Cardiovascular Disease

## 2014-07-29 ENCOUNTER — Encounter: Payer: Self-pay | Admitting: Cardiovascular Disease

## 2014-07-29 ENCOUNTER — Ambulatory Visit (INDEPENDENT_AMBULATORY_CARE_PROVIDER_SITE_OTHER): Payer: Medicare Other | Admitting: Cardiovascular Disease

## 2014-07-29 VITALS — BP 140/78 | HR 85 | Ht 61.0 in | Wt 187.7 lb

## 2014-07-29 DIAGNOSIS — I2510 Atherosclerotic heart disease of native coronary artery without angina pectoris: Secondary | ICD-10-CM | POA: Diagnosis not present

## 2014-07-29 DIAGNOSIS — E785 Hyperlipidemia, unspecified: Secondary | ICD-10-CM

## 2014-07-29 DIAGNOSIS — I10 Essential (primary) hypertension: Secondary | ICD-10-CM

## 2014-07-29 DIAGNOSIS — I447 Left bundle-branch block, unspecified: Secondary | ICD-10-CM

## 2014-07-29 DIAGNOSIS — E039 Hypothyroidism, unspecified: Secondary | ICD-10-CM | POA: Diagnosis not present

## 2014-07-29 NOTE — Patient Instructions (Signed)
Your physician has requested that you have a lexiscan myoview and office appointment in November 2016. No changes were made today in your therapy.

## 2014-07-29 NOTE — Progress Notes (Signed)
Patient ID: [name], female   DOB: 1924/11/02, 80 y.o.   MRN: [id number]     HPI: [name],is a 80 y.o. female who has known coronary artery disease and presents to the office today for a one year followup evaluation.  Mrs Tredway has known CAD and underwent  high-speed rotational atherectomy of a calcified LAD in the region of the very large diagonal vessel by me in 2000. She had excellent angiographic result. She been cared for by Dr. Rex Kras in the past. She has a history of hypothyroidism, hyperlipidemia, asthma, as well as hypertension. She was hospitalized last year for lower chest pain and had transient ECG changes with transient left bundle branch block, right bundle branch block. Her beta blocker dose was reduced. She was felt most likely to have GI symptoms. An upper GI did suggest the possibility of a hiatal hernia. She underwent endoscopy  and she was told of having some small polyps. Remotely she had been on Paxil and most recently has been on Prilosec presents for subsequent cardiology evaluation.  She has a history of hypertension and is on Micardis HCT 80/25, Toprol 100 mg in the morning.  She has a history of hyper lipidemia and has been on Zetia 10 mg in addition to Crestor 20 mg daily.  She has been taking Synthroid 25 g for mild hypothyroidism.  Type 2 diabetes mellitus has been treated with Clomid per minute 1 mg.  She continues to be on dual antiplatelet therapy.  Since I saw her one year ago she has had some issues with asthma.  She  recently saw Dr. Cristina Gong and was placed on probiotics.  She has required back injections into her lower spine by Dr. Nelva Bush.  She denies recurrent anginal symptoms.  She denies significant shortness of breath.   She underwent blood work by Dr. Maudie Mercury and I was able to obtain these results today for my review.  Laboratory was done on 06/10/2014 revealed a hemoglobin of 12.8, hematocrit 40.4.  Blood sugar was 119.  BUN 44, creatinine 1.3.  Hemoglobin  A1c was elevated at 6.4.  She normal liver function studies.  Lipid studies revealed a total cholesterol 194, triglycerides 132, HDL 64, and LDL 104.  TSH was 0.58.   She presents for cardiology evaluation.  Past Medical History  Diagnosis Date  . Asthma   . Coronary artery disease   . Diabetes mellitus without complication   . Hypothyroidism   . GERD (gastroesophageal reflux disease)   . Insomnia   . Anxiety   . Hypertension   . CAD (coronary artery disease)   . Osteopenia   . Osteoarthritis   . H/O echocardiogram 06-26-2012    EF 55%  . H/O cardiovascular stress test 06-26-2012    compared to previous study there is no significant change; normal myocardial perfusion study. no significant ischemia demonstrated . this is a low risk scan  . History of cardiac cath 09-30-1998    cath done by Dr. Claiborne Billings, successful rotation atherectomy of the left anterior decending artert with reduction from 80 to105  . Hx of cardiac cath 09-26-1998    dignostic cath dr. Claiborne Billings to do rotoblator at a later date  . History of Doppler ultrasound 03-30-2008    neg abd. doppler for aneurysm  . History of Holter monitoring 12-22-2007    NSR with pac occs. pvc's    Past Surgical History  Procedure Laterality Date  . Appendectomy  1947  . Dilation and  curettage of uterus  1960  . Vesicovaginal fistula closure w/ tah  1971    Dr Mallie Mussel  . Bladder tack  1970    Dr Rosana Hoes  . Breast lumpectomy  1970    Left, Dr Leandrew Koyanagi  . Nasal sinus surgery  1980    Left; for chronic sinusitis  . Revision total knee arthroplasty  2005    Dr Alvan Dame Right knee    No Known Allergies  Current Outpatient Prescriptions  Medication Sig Dispense Refill  . acetaminophen-codeine (TYLENOL #3) 300-30 MG per tablet Take 1 tablet by mouth as needed.   0  . albuterol (PROVENTIL) (2.5 MG/3ML) 0.083% nebulizer solution Take 3 mLs (2.5 mg total) by nebulization every 6 (six) hours as needed for wheezing. 75 mL 0  . aspirin EC 81 MG tablet Take  81 mg by mouth at bedtime.     Marland Kitchen azelastine (ASTELIN) 137 MCG/SPRAY nasal spray Place 1 spray into the nose 2 (two) times daily. Use in each nostril as directed    . clopidogrel (PLAVIX) 75 MG tablet Take 75 mg by mouth daily.    . clorazepate (TRANXENE) 15 MG tablet Take 15 mg by mouth at bedtime. anxiety    . ezetimibe (ZETIA) 10 MG tablet Take 10 mg by mouth at bedtime.    Marland Kitchen FLUoxetine (PROZAC) 20 MG capsule Take 20 mg by mouth daily.    Marland Kitchen glimepiride (AMARYL) 1 MG tablet Take 1 mg by mouth daily before breakfast.    . levothyroxine (SYNTHROID, LEVOTHROID) 50 MCG tablet Take 25 mcg by mouth daily.     Marland Kitchen LORazepam (ATIVAN) 1 MG tablet Take 1 mg by mouth every 8 (eight) hours as needed for anxiety.    . metoprolol succinate (TOPROL-XL) 50 MG 24 hr tablet Take 2 tablets (100 mg total) by mouth daily. Take with or immediately following a meal. 180 tablet 2  . MICARDIS HCT 80-25 MG per tablet Take 1 tablet by mouth daily.    . montelukast (SINGULAIR) 10 MG tablet Take 10 mg by mouth at bedtime.    . Multiple Vitamin (MULTIVITAMIN WITH MINERALS) TABS tablet Take 1 tablet by mouth daily.    . nitroGLYCERIN (NITROSTAT) 0.4 MG SL tablet Place 0.4 mg under the tongue every 5 (five) minutes as needed. For chest pain    . omeprazole (PRILOSEC) 20 MG capsule Take 20 mg by mouth daily.    . predniSONE (DELTASONE) 50 MG tablet Take one tab daily for the next 4 days 4 tablet 0  . PROAIR HFA 108 (90 BASE) MCG/ACT inhaler Inhale 1 puff into the lungs as needed.   4  . QUEtiapine (SEROQUEL) 25 MG tablet Take 50 mg by mouth daily with breakfast.    . rosuvastatin (CRESTOR) 20 MG tablet Take 20 mg by mouth at bedtime.     . saccharomyces boulardii (FLORASTOR) 250 MG capsule Take 250 mg by mouth 2 (two) times daily.    Marland Kitchen topiramate (TOPAMAX) 25 MG tablet Take 25 mg by mouth at bedtime.     No current facility-administered medications for this visit.    Socially she is widowed with one child one grandchild. She  with her son. No tobacco history.   ROS General: Negative; No fevers, chills, or night sweats;  HEENT: Negative; No changes in vision or hearing, sinus congestion, difficulty swallowing Pulmonary: Positive for asthma; no shortness of breath, hemoptysis Cardiovascular: Negative; No chest pain, presyncope, syncope, palpitations GI: Negative; No nausea, vomiting, diarrhea, or abdominal  pain GU: Negative; No dysuria, hematuria, or difficulty voiding Musculoskeletal: Negative; no myalgias, joint pain, or weakness Hematologic/Oncology: Negative; no easy bruising, bleeding Endocrine: Positive for hypothyroidism and type 2 diabetes mellitus Neuro: Negative; no changes in balance, headaches Skin: Negative; No rashes or skin lesions Psychiatric: Negative; No behavioral problems, depression Sleep: Negative; No snoring, daytime sleepiness, hypersomnolence, bruxism, restless legs, hypnogognic hallucinations, no cataplexy Other comprehensive 14 point system review is negative.   PE BP 140/78 mmHg  Pulse 85  Ht 5\' 1"  (1.549 m)  Wt 187 lb 11.2 oz (85.14 kg)  BMI 35.48 kg/m2  General: Alert, oriented, no distress.  HEENT: Normocephalic, atraumatic. Pupils round and reactive; sclera anicteric;  Nose without nasal septal hypertrophy Mouth/Parynx benign; Mallinpatti scale 3 Neck: No JVD, no carotid bruits with normal carotid upstroke Lungs: clear to ausculatation and percussion; no wheezing or rales Heart: RRR, s1 s2 normal 1/6 systolic murmur; no diastolic murmur.  No rubs thrills or heaves. Abdomen: soft, nontender; no hepatosplenomehaly, BS+; abdominal aorta nontender and not dilated by palpation. Pulses 2+ Extremities: no clubbinbg cyanosis or edema, Homan's sign negative  Neurologic: grossly nonfocal Psychological: Normal affect and mood  ECG (independently read by me).  Normal sinus rhythm. Left Bundle branch block with repolarization changes compared to her prior tracing of one year  ago.  Prior November 2014 ECG: Normal sinus rhythm at 79 beats per minute. Mild incomplete conduction delay. Normal intervals. LABS:  BMET    Component Value Date/Time   NA 137 01/19/2014 0531   K 4.8 01/19/2014 0531   CL 101 01/19/2014 0531   CO2 24 01/19/2014 0531   GLUCOSE 156* 01/19/2014 0531   BUN 39* 01/19/2014 0531   CREATININE 0.90 01/19/2014 0531   CALCIUM 9.3 01/19/2014 0531   GFRNONAA 56* 01/19/2014 0531   GFRAA 65* 01/19/2014 0531     Hepatic Function Panel     Component Value Date/Time   PROT 7.2 08/04/2013 0010   ALBUMIN 3.7 08/04/2013 0010   AST 28 08/04/2013 0010   ALT 29 08/04/2013 0010   ALKPHOS 68 08/04/2013 0010   BILITOT 0.1* 08/04/2013 0010   BILIDIR <0.1 08/04/2013 0010   IBILI NOT CALCULATED 08/04/2013 0010     CBC    Component Value Date/Time   WBC 5.7 01/19/2014 0531   RBC 4.62 01/19/2014 0531   HGB 12.3 01/19/2014 0531   HCT 38.1 01/19/2014 0531   PLT 209 01/19/2014 0531   MCV 82.5 01/19/2014 0531   MCH 26.6 01/19/2014 0531   MCHC 32.3 01/19/2014 0531   RDW 14.2 01/19/2014 0531   LYMPHSABS 2.1 07/16/2010 1508   MONOABS 0.8 07/16/2010 1508   EOSABS 0.3 07/16/2010 1508   BASOSABS 0.0 07/16/2010 1508     BNP    Component Value Date/Time   PROBNP 250.3 01/18/2014 1512    Lipid Panel  No results found for: CHOL   RADIOLOGY: No results found.    ASSESSMENT AND PLAN: Ms. Stotts is now 80 years old and is 15 years status post high-speed rotational atherectomy of a very calcified LAD.  She continues to be without anginal symptomatology.  She has been on chronic delirium antiplatelet therapy and continues to tolerate this well without bleeding.  Her blood pressure today initially was 140/78.  When taken by the nurse and on repeat by me was 130/78.  On her current dose of my Cardis HCT 80/25 in addition to Toprol-XL 100 mg.  Her resting pulse is 85.  She is now with  left bundle branch block.  She continues to be on Zetia and Crestor  for hyperlipidemia.  Ideal LDL is less than 70 in this patient with established card artery disease.  Her most recent laboratory in October 2015 revealed an LDL of 104.  She is 80 years old.   An echo Doppler study November 2013 showed an ejection fraction greater than 20% normal systolic function with grade 1 diastolic dysfunction. She did have moderate aortic sclerosis without stenosis. There was mild mitral regurgitation and tricuspid regurgitation. A nuclear perfusion study November 2013 revealed normal perfusion without scar or ischemia. Post-rest ejection fraction was 73%.  She is on Synthroid for hypothyroidism and Nipride for her diabetes mellitus type 2.  As long as she is stable, she will continue current therapy.  In November 2016 .  I am recommending that she undergo a three-year follow-up Lexiscan myocardial perfusion study to reassess scar/ischemia.  I will see her in the office in follow-up and further recommendations will be made at that time.  Time spent: 25 minutes.  Troy Sine, MD, [facility]  07/29/2014 4:33 PM

## 2014-07-31 ENCOUNTER — Encounter: Payer: Self-pay | Admitting: Cardiovascular Disease

## 2014-07-31 DIAGNOSIS — I447 Left bundle-branch block, unspecified: Secondary | ICD-10-CM | POA: Insufficient documentation

## 2014-08-05 ENCOUNTER — Other Ambulatory Visit: Payer: Self-pay | Admitting: Cardiovascular Disease

## 2014-08-05 NOTE — Telephone Encounter (Signed)
Rx was sent to pharmacy electronically. 

## 2014-08-31 DIAGNOSIS — B370 Candidal stomatitis: Secondary | ICD-10-CM | POA: Diagnosis not present

## 2014-09-13 DIAGNOSIS — L84 Corns and callosities: Secondary | ICD-10-CM | POA: Diagnosis not present

## 2014-09-13 DIAGNOSIS — L603 Nail dystrophy: Secondary | ICD-10-CM | POA: Diagnosis not present

## 2014-09-13 DIAGNOSIS — I739 Peripheral vascular disease, unspecified: Secondary | ICD-10-CM | POA: Diagnosis not present

## 2014-09-13 DIAGNOSIS — E1151 Type 2 diabetes mellitus with diabetic peripheral angiopathy without gangrene: Secondary | ICD-10-CM | POA: Diagnosis not present

## 2014-10-07 DIAGNOSIS — J45901 Unspecified asthma with (acute) exacerbation: Secondary | ICD-10-CM | POA: Diagnosis not present

## 2014-11-05 DIAGNOSIS — R143 Flatulence: Secondary | ICD-10-CM | POA: Diagnosis not present

## 2014-11-05 DIAGNOSIS — J45909 Unspecified asthma, uncomplicated: Secondary | ICD-10-CM | POA: Diagnosis not present

## 2014-11-05 DIAGNOSIS — F458 Other somatoform disorders: Secondary | ICD-10-CM | POA: Diagnosis not present

## 2014-11-05 DIAGNOSIS — R05 Cough: Secondary | ICD-10-CM | POA: Diagnosis not present

## 2014-11-05 DIAGNOSIS — K219 Gastro-esophageal reflux disease without esophagitis: Secondary | ICD-10-CM | POA: Diagnosis not present

## 2014-11-05 DIAGNOSIS — R110 Nausea: Secondary | ICD-10-CM | POA: Diagnosis not present

## 2014-11-09 DIAGNOSIS — M791 Myalgia: Secondary | ICD-10-CM | POA: Diagnosis not present

## 2014-11-09 DIAGNOSIS — W19XXXA Unspecified fall, initial encounter: Secondary | ICD-10-CM | POA: Diagnosis not present

## 2014-11-09 DIAGNOSIS — M25552 Pain in left hip: Secondary | ICD-10-CM | POA: Diagnosis not present

## 2014-11-09 DIAGNOSIS — S3992XA Unspecified injury of lower back, initial encounter: Secondary | ICD-10-CM | POA: Diagnosis not present

## 2014-11-09 DIAGNOSIS — M545 Low back pain: Secondary | ICD-10-CM | POA: Diagnosis not present

## 2014-11-09 DIAGNOSIS — S79912A Unspecified injury of left hip, initial encounter: Secondary | ICD-10-CM | POA: Diagnosis not present

## 2014-11-09 DIAGNOSIS — F419 Anxiety disorder, unspecified: Secondary | ICD-10-CM | POA: Diagnosis not present

## 2014-11-19 DIAGNOSIS — J45901 Unspecified asthma with (acute) exacerbation: Secondary | ICD-10-CM | POA: Diagnosis not present

## 2014-11-19 DIAGNOSIS — J209 Acute bronchitis, unspecified: Secondary | ICD-10-CM | POA: Diagnosis not present

## 2014-11-19 DIAGNOSIS — J45909 Unspecified asthma, uncomplicated: Secondary | ICD-10-CM | POA: Diagnosis not present

## 2014-12-02 DIAGNOSIS — R0989 Other specified symptoms and signs involving the circulatory and respiratory systems: Secondary | ICD-10-CM | POA: Diagnosis not present

## 2014-12-02 DIAGNOSIS — J45909 Unspecified asthma, uncomplicated: Secondary | ICD-10-CM | POA: Diagnosis not present

## 2014-12-02 DIAGNOSIS — B370 Candidal stomatitis: Secondary | ICD-10-CM | POA: Diagnosis not present

## 2014-12-02 DIAGNOSIS — Z79899 Other long term (current) drug therapy: Secondary | ICD-10-CM | POA: Diagnosis not present

## 2014-12-13 DIAGNOSIS — L84 Corns and callosities: Secondary | ICD-10-CM | POA: Diagnosis not present

## 2014-12-13 DIAGNOSIS — I739 Peripheral vascular disease, unspecified: Secondary | ICD-10-CM | POA: Diagnosis not present

## 2014-12-13 DIAGNOSIS — L603 Nail dystrophy: Secondary | ICD-10-CM | POA: Diagnosis not present

## 2014-12-13 DIAGNOSIS — E1151 Type 2 diabetes mellitus with diabetic peripheral angiopathy without gangrene: Secondary | ICD-10-CM | POA: Diagnosis not present

## 2014-12-15 DIAGNOSIS — J45909 Unspecified asthma, uncomplicated: Secondary | ICD-10-CM | POA: Diagnosis not present

## 2014-12-15 DIAGNOSIS — R0989 Other specified symptoms and signs involving the circulatory and respiratory systems: Secondary | ICD-10-CM | POA: Diagnosis not present

## 2014-12-15 DIAGNOSIS — R1310 Dysphagia, unspecified: Secondary | ICD-10-CM | POA: Diagnosis not present

## 2014-12-15 DIAGNOSIS — B370 Candidal stomatitis: Secondary | ICD-10-CM | POA: Diagnosis not present

## 2014-12-16 DIAGNOSIS — E780 Pure hypercholesterolemia: Secondary | ICD-10-CM | POA: Diagnosis not present

## 2014-12-16 DIAGNOSIS — E559 Vitamin D deficiency, unspecified: Secondary | ICD-10-CM | POA: Diagnosis not present

## 2014-12-16 DIAGNOSIS — I10 Essential (primary) hypertension: Secondary | ICD-10-CM | POA: Diagnosis not present

## 2014-12-16 DIAGNOSIS — E039 Hypothyroidism, unspecified: Secondary | ICD-10-CM | POA: Diagnosis not present

## 2014-12-16 DIAGNOSIS — E119 Type 2 diabetes mellitus without complications: Secondary | ICD-10-CM | POA: Diagnosis not present

## 2014-12-20 DIAGNOSIS — K219 Gastro-esophageal reflux disease without esophagitis: Secondary | ICD-10-CM | POA: Diagnosis not present

## 2014-12-20 DIAGNOSIS — J387 Other diseases of larynx: Secondary | ICD-10-CM | POA: Diagnosis not present

## 2014-12-20 DIAGNOSIS — R220 Localized swelling, mass and lump, head: Secondary | ICD-10-CM | POA: Diagnosis not present

## 2014-12-20 DIAGNOSIS — J329 Chronic sinusitis, unspecified: Secondary | ICD-10-CM | POA: Diagnosis not present

## 2014-12-22 DIAGNOSIS — I10 Essential (primary) hypertension: Secondary | ICD-10-CM | POA: Diagnosis not present

## 2014-12-22 DIAGNOSIS — E119 Type 2 diabetes mellitus without complications: Secondary | ICD-10-CM | POA: Diagnosis not present

## 2014-12-22 DIAGNOSIS — E039 Hypothyroidism, unspecified: Secondary | ICD-10-CM | POA: Diagnosis not present

## 2014-12-22 DIAGNOSIS — E780 Pure hypercholesterolemia: Secondary | ICD-10-CM | POA: Diagnosis not present

## 2015-01-20 ENCOUNTER — Ambulatory Visit (INDEPENDENT_AMBULATORY_CARE_PROVIDER_SITE_OTHER): Payer: Medicare Other | Admitting: Internal Medicine

## 2015-01-20 ENCOUNTER — Encounter: Payer: Self-pay | Admitting: Internal Medicine

## 2015-01-20 ENCOUNTER — Ambulatory Visit (INDEPENDENT_AMBULATORY_CARE_PROVIDER_SITE_OTHER)
Admission: RE | Admit: 2015-01-20 | Discharge: 2015-01-20 | Disposition: A | Discharge status: Home / Self Care | Payer: Medicare Other | Source: Ambulatory Visit | Attending: Internal Medicine | Admitting: Internal Medicine

## 2015-01-20 VITALS — BP 122/62 | HR 90 | Ht 61.0 in | Wt 191.0 lb

## 2015-01-20 DIAGNOSIS — J4540 Moderate persistent asthma, uncomplicated: Secondary | ICD-10-CM | POA: Diagnosis not present

## 2015-01-20 DIAGNOSIS — I480 Paroxysmal atrial fibrillation: Secondary | ICD-10-CM

## 2015-01-20 DIAGNOSIS — J45909 Unspecified asthma, uncomplicated: Secondary | ICD-10-CM | POA: Diagnosis not present

## 2015-01-20 DIAGNOSIS — J4521 Mild intermittent asthma with (acute) exacerbation: Secondary | ICD-10-CM

## 2015-01-20 MED ORDER — UMECLIDINIUM-VILANTEROL 62.5-25 MCG/INH IN AEPB: 1.0000 | INHALATION_SPRAY | Freq: Every day | RESPIRATORY_TRACT | Status: DC

## 2015-01-20 MED ORDER — LEVALBUTEROL HCL 0.63 MG/3ML IN NEBU
0.6300 mg | INHALATION_SOLUTION | Freq: Once | RESPIRATORY_TRACT | Status: AC
  Administered 2015-01-20: 0.63 mg via RESPIRATORY_TRACT

## 2015-01-20 MED ORDER — METHYLPREDNISOLONE ACETATE 80 MG/ML IJ SUSP
80.0000 mg | Freq: Once | INTRAMUSCULAR | Status: AC
  Administered 2015-01-20: 80 mg via INTRAMUSCULAR

## 2015-01-20 NOTE — Assessment & Plan Note (Signed)
Probably initially a winter viral bronchitis, now lingering. Plan- CXR, neb treatment and depomedrol. Try Anoro.

## 2015-01-20 NOTE — Progress Notes (Signed)
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 Now describes persistent nasal congestion and feels that she has had a sinusitis since October, 2013. Asthma flare in December needing nebulizer treatments twice daily, Z-Pak, 2 or 3 weeks of prednisone and Augmentin. CT maxillofacial-mild sphenoid sinusitis and an incidental right carotid lesion for which she is seeing Dr Erik Obey soon. Remains short of breath and aware of weight gain. Wheeze in cold air. CAD/ Dr Claiborne Billings, valvular heart disease, rotablator procedure.  CXR 07/19/12 IMPRESSION:  No acute abnormality. Extensive coronary artery calcification.  Original Report Authenticated By: Lorriane Shire, M.D.   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 Dr Erik Obey had done CTmax/fac- left sphenoid air-fluid level. She says she has had "asthma" all winter, meaning shortness of breath and some chest tightness. Not relieved by Spiriva. Physically very inactive. She was unable to perform 6 minute walk test PFT: 10/10/2012-mild obstructive airways disease with insignificant response to bronchodilator. Air-trapping with increased residual volume. Diffusion mildly reduced. FEC 1.61/76%, 11.05/77%, FEV1/FVC 0.65, FEF 25-75% 0.56/33%. RV 134%, DLCO 76%.  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 Dr Erik Obey ENT saw raw area near larynx "cough and reflux". Wheezes with any exertion. Mostly dry cough. Proair 1x daily, nebulizer 2-3x/ week.  ROS-see HPI Constitutional:   No-   weight loss, night sweats, fevers, chills, fatigue, lassitude. HEENT:   No-  headaches, difficulty swallowing, tooth/dental problems, sore throat,       No-  sneezing, itching, ear ache, +nasal congestion, +post nasal drip,  CV:  No-   chest pain, orthopnea, PND, swelling in lower extremities, anasarca,                                  dizziness, palpitations Resp:+ shortness of breath with exertion or at rest.              No-   productive cough,  No non-productive cough,  No- coughing up of blood.              No-   change in color of mucus.  + wheezing.   Skin: No-   rash or lesions. GI:  No-   heartburn, indigestion, abdominal pain, nausea, vomiting,  GU:  MS:  No-   joint pain or swelling.   Neuro-     nothing unusual Psych:  No- change in mood or affect. No depression or anxiety.  No memory loss.  OBJ- Physical Exam General- Alert, Oriented, Affect-appropriate, Distress- none acute, obese Skin- rash-none, lesions- none, excoriation- none Lymphadenopathy- none Head- atraumatic            Eyes- Gross vision intact, PERRLA, conjunctivae and secretions clear  Ears- Hearing, canals-normal            Nose- Clear, no-Septal dev, mucus, polyps, erosion, perforation             Throat- Mallampati III , mucosa clear , drainage- none, tonsils- atrophic, + broken tooth Neck- flexible , trachea midline, no stridor , thyroid nl, carotid no bruit Chest - symmetrical excursion , unlabored           Heart/CV- RRR , no murmur heard , no gallop  , no rub, nl s1 s2                           - JVD- none , edema- none, stasis changes- none, varices- none           Lung- clear to P&A, wheeze+trace, cough-+dry , dullness-none, rub- none.            Chest wall-   Abd-  Br/ Gen/ Rectal- Not done, not indicated Extrem- cyanosis- none, clubbing, none, atrophy- none, strength- nl. + cane Neuro- grossly intact to observation

## 2015-01-20 NOTE — Patient Instructions (Signed)
Order- CXr   Dx asthmatic bronchitis  Neb xop 0.63  Depo 80  Sample Anoro Ellipta inhaler   1 puff, once daily  Ok to use your Proair and nebulizer as needed

## 2015-01-20 NOTE — Assessment & Plan Note (Signed)
She is not describing recent palpitation that might relate to sympathetic bronchotherapy.

## 2015-01-21 NOTE — Progress Notes (Signed)
Quick Note:  Called and spoke to pt. Informed her of the results and recs per CY. Pt verbalized understanding and denied any further questions or concerns at this time.   ______ 

## 2015-01-27 ENCOUNTER — Telehealth: Payer: Self-pay | Admitting: Internal Medicine

## 2015-01-27 MED ORDER — UMECLIDINIUM-VILANTEROL 62.5-25 MCG/INH IN AEPB: 1.0000 | INHALATION_SPRAY | Freq: Every day | RESPIRATORY_TRACT | Status: DC

## 2015-01-27 NOTE — Telephone Encounter (Signed)
Spoke with pt's husband. She needs a prescription for Anoro sent to their pharmacy. This has been taken care of. Nothing further was needed.

## 2015-02-09 DIAGNOSIS — R490 Dysphonia: Secondary | ICD-10-CM | POA: Diagnosis not present

## 2015-02-09 DIAGNOSIS — K5901 Slow transit constipation: Secondary | ICD-10-CM | POA: Diagnosis not present

## 2015-02-09 DIAGNOSIS — R140 Abdominal distension (gaseous): Secondary | ICD-10-CM | POA: Diagnosis not present

## 2015-03-07 DIAGNOSIS — L603 Nail dystrophy: Secondary | ICD-10-CM | POA: Diagnosis not present

## 2015-03-07 DIAGNOSIS — L84 Corns and callosities: Secondary | ICD-10-CM | POA: Diagnosis not present

## 2015-03-07 DIAGNOSIS — E1151 Type 2 diabetes mellitus with diabetic peripheral angiopathy without gangrene: Secondary | ICD-10-CM | POA: Diagnosis not present

## 2015-03-07 DIAGNOSIS — I739 Peripheral vascular disease, unspecified: Secondary | ICD-10-CM | POA: Diagnosis not present

## 2015-03-22 DIAGNOSIS — J329 Chronic sinusitis, unspecified: Secondary | ICD-10-CM | POA: Diagnosis not present

## 2015-03-22 DIAGNOSIS — R490 Dysphonia: Secondary | ICD-10-CM | POA: Diagnosis not present

## 2015-03-22 DIAGNOSIS — K219 Gastro-esophageal reflux disease without esophagitis: Secondary | ICD-10-CM | POA: Diagnosis not present

## 2015-04-27 DIAGNOSIS — E119 Type 2 diabetes mellitus without complications: Secondary | ICD-10-CM | POA: Diagnosis not present

## 2015-04-27 DIAGNOSIS — I10 Essential (primary) hypertension: Secondary | ICD-10-CM | POA: Diagnosis not present

## 2015-04-27 DIAGNOSIS — E039 Hypothyroidism, unspecified: Secondary | ICD-10-CM | POA: Diagnosis not present

## 2015-05-02 ENCOUNTER — Ambulatory Visit (INDEPENDENT_AMBULATORY_CARE_PROVIDER_SITE_OTHER): Payer: Medicare Other | Admitting: Internal Medicine

## 2015-05-02 ENCOUNTER — Encounter: Payer: Self-pay | Admitting: Internal Medicine

## 2015-05-02 VITALS — BP 120/76 | HR 96 | Ht 61.0 in | Wt 193.4 lb

## 2015-05-02 DIAGNOSIS — K219 Gastro-esophageal reflux disease without esophagitis: Secondary | ICD-10-CM | POA: Diagnosis not present

## 2015-05-02 DIAGNOSIS — J4530 Mild persistent asthma, uncomplicated: Secondary | ICD-10-CM | POA: Diagnosis not present

## 2015-05-02 MED ORDER — BENZONATATE 200 MG PO CAPS: 200.0000 mg | ORAL_CAPSULE | Freq: Three times a day (TID) | ORAL | Status: DC | PRN

## 2015-05-02 MED ORDER — ALBUTEROL SULFATE (2.5 MG/3ML) 0.083% IN NEBU: 2.5000 mg | INHALATION_SOLUTION | Freq: Four times a day (QID) | RESPIRATORY_TRACT | Status: AC | PRN

## 2015-05-02 NOTE — Progress Notes (Signed)
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 Now describes persistent nasal congestion and feels that she has had a sinusitis since October, 2013. Asthma flare in December needing nebulizer treatments twice daily, Z-Pak, 2 or 3 weeks of prednisone and Augmentin. CT maxillofacial-mild sphenoid sinusitis and an incidental right carotid lesion for which she is seeing Dr Erik Obey soon. Remains short of breath and aware of weight gain. Wheeze in cold air. CAD/ Dr Claiborne Billings, valvular heart disease, rotablator procedure.  CXR 07/19/12 IMPRESSION:  No acute abnormality. Extensive coronary artery calcification.  Original Report Authenticated By: Lorriane Shire, M.D.   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 Dr Erik Obey had done CTmax/fac- left sphenoid air-fluid level. She says she has had "asthma" all winter, meaning shortness of breath and some chest tightness. Not relieved by Spiriva. Physically very inactive. She was unable to perform 6 minute walk test PFT: 10/10/2012-mild obstructive airways disease with insignificant response to bronchodilator. Air-trapping with increased residual volume. Diffusion mildly reduced. FEC 1.61/76%, 11.05/77%, FEV1/FVC 0.65, FEF 25-75% 0.56/33%. RV 134%, DLCO 76%.  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 Dr Erik Obey ENT saw raw area near larynx "cough and reflux". Wheezes with any exertion. Mostly dry cough. Proair 1x daily, nebulizer 2-3x/ week.  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 Denies any fever or chills.  Husband here              got flu vaccine from primary physician Describes some green sputum with cough. Following with ENT for some ulceration on vocal cord attributed to reflux and cough. CT of sinuses reported clear. Continues Anoro, pro-air, Singulair, omeprazole. CXR 01/20/15 IMPRESSION: No active cardiopulmonary disease. Electronically Signed  By: Kerby Moors M.D.  On: 01/20/2015 14:36  ROS-see HPI Constitutional:   No-   weight loss, night sweats, fevers, chills, fatigue, lassitude. HEENT:   No-  headaches, difficulty swallowing, tooth/dental problems, sore throat,       No-  sneezing, itching, ear ache, +nasal congestion, +post nasal drip,  CV:  No-   chest pain, orthopnea, PND, swelling in lower extremities, anasarca,                                                        dizziness, palpitations Resp:+ shortness of breath with exertion or at rest.              No-   productive cough,  No non-productive cough,  No- coughing up of blood.  No-   change in color of mucus.  + wheezing.   Skin: No-   rash or lesions. GI:  No-   heartburn, indigestion, abdominal pain, nausea, vomiting,  GU:  MS:  No-   joint pain or swelling.   Neuro-     nothing unusual Psych:  No- change in mood or affect. No depression or anxiety.  No memory loss.  OBJ- Physical Exam General- Alert, Oriented, Affect-appropriate, Distress- none acute, + obese Skin- rash-none, lesions- none, excoriation- none Lymphadenopathy- none Head- atraumatic            Eyes-  Gross vision intact, PERRLA, conjunctivae and secretions clear            Ears- Hearing, canals-normal            Nose- Clear, no-Septal dev, mucus, polyps, erosion, perforation             Throat- Mallampati III , mucosa clear , drainage- none, tonsils- atrophic, + broken tooth Neck- flexible , trachea midline, no stridor , thyroid nl, carotid no bruit Chest - symmetrical excursion , unlabored           Heart/CV- RRR , no murmur heard , no gallop  , no rub, nl s1 s2                           - JVD- none , edema- none, stasis changes- none, varices- none           Lung- clear to P&A, wheeze+trace, cough-+ raspy upper airway , dullness-none, rub- none.            Chest wall-  Abd-  Br/ Gen/ Rectal- Not done, not indicated Extrem- cyanosis- none, clubbing, none, atrophy- none, strength- nl. + cane Neuro- grossly intact to observation

## 2015-05-02 NOTE — Patient Instructions (Signed)
Script sent refilling your albuterol nebulizer solution  Script to try benzonatate perles for cough  Ok to use otc Delsym cough syrup and throat lozenges in betgween the benzonatate perles as needed  Don't forget to get your flu shot this fall

## 2015-05-04 DIAGNOSIS — I10 Essential (primary) hypertension: Secondary | ICD-10-CM | POA: Diagnosis not present

## 2015-05-04 DIAGNOSIS — K219 Gastro-esophageal reflux disease without esophagitis: Secondary | ICD-10-CM | POA: Diagnosis not present

## 2015-05-04 DIAGNOSIS — E119 Type 2 diabetes mellitus without complications: Secondary | ICD-10-CM | POA: Diagnosis not present

## 2015-05-04 DIAGNOSIS — E039 Hypothyroidism, unspecified: Secondary | ICD-10-CM | POA: Diagnosis not present

## 2015-05-08 DIAGNOSIS — K219 Gastro-esophageal reflux disease without esophagitis: Secondary | ICD-10-CM | POA: Insufficient documentation

## 2015-05-08 NOTE — Assessment & Plan Note (Signed)
Chronic bronchitis versus reflux causing cough Plan-refill albuterol nebulizer solution, benzonatate for cough, discussed indications for antibiotic

## 2015-05-08 NOTE — Assessment & Plan Note (Signed)
Plan-reflux precautions, continue omeprazole, follow-up with ENT

## 2015-05-30 DIAGNOSIS — I739 Peripheral vascular disease, unspecified: Secondary | ICD-10-CM | POA: Diagnosis not present

## 2015-05-30 DIAGNOSIS — L603 Nail dystrophy: Secondary | ICD-10-CM | POA: Diagnosis not present

## 2015-05-30 DIAGNOSIS — L84 Corns and callosities: Secondary | ICD-10-CM | POA: Diagnosis not present

## 2015-05-30 DIAGNOSIS — E1151 Type 2 diabetes mellitus with diabetic peripheral angiopathy without gangrene: Secondary | ICD-10-CM | POA: Diagnosis not present

## 2015-05-31 DIAGNOSIS — G894 Chronic pain syndrome: Secondary | ICD-10-CM | POA: Diagnosis not present

## 2015-05-31 DIAGNOSIS — M5136 Other intervertebral disc degeneration, lumbar region: Secondary | ICD-10-CM | POA: Diagnosis not present

## 2015-06-10 DIAGNOSIS — M47816 Spondylosis without myelopathy or radiculopathy, lumbar region: Secondary | ICD-10-CM | POA: Diagnosis not present

## 2015-06-10 DIAGNOSIS — M545 Low back pain: Secondary | ICD-10-CM | POA: Diagnosis not present

## 2015-06-20 ENCOUNTER — Encounter: Payer: Self-pay | Admitting: Cardiovascular Disease

## 2015-06-25 ENCOUNTER — Emergency Department (HOSPITAL_COMMUNITY): Payer: Medicare Other

## 2015-06-25 ENCOUNTER — Encounter (HOSPITAL_COMMUNITY): Payer: Self-pay | Admitting: Emergency Medicine

## 2015-06-25 ENCOUNTER — Emergency Department (HOSPITAL_COMMUNITY)
Admission: EM | Admit: 2015-06-25 | Discharge: 2015-06-26 | Disposition: A | Discharge status: Home / Self Care | Payer: Medicare Other | Attending: Emergency Medicine | Admitting: Emergency Medicine

## 2015-06-25 DIAGNOSIS — I2510 Atherosclerotic heart disease of native coronary artery without angina pectoris: Secondary | ICD-10-CM | POA: Insufficient documentation

## 2015-06-25 DIAGNOSIS — R0602 Shortness of breath: Secondary | ICD-10-CM | POA: Diagnosis present

## 2015-06-25 DIAGNOSIS — E039 Hypothyroidism, unspecified: Secondary | ICD-10-CM | POA: Insufficient documentation

## 2015-06-25 DIAGNOSIS — E119 Type 2 diabetes mellitus without complications: Secondary | ICD-10-CM | POA: Diagnosis not present

## 2015-06-25 DIAGNOSIS — F419 Anxiety disorder, unspecified: Secondary | ICD-10-CM | POA: Insufficient documentation

## 2015-06-25 DIAGNOSIS — J45901 Unspecified asthma with (acute) exacerbation: Secondary | ICD-10-CM | POA: Diagnosis not present

## 2015-06-25 DIAGNOSIS — R05 Cough: Secondary | ICD-10-CM | POA: Diagnosis not present

## 2015-06-25 DIAGNOSIS — Z79899 Other long term (current) drug therapy: Secondary | ICD-10-CM | POA: Diagnosis not present

## 2015-06-25 DIAGNOSIS — Z7982 Long term (current) use of aspirin: Secondary | ICD-10-CM | POA: Diagnosis not present

## 2015-06-25 DIAGNOSIS — K219 Gastro-esophageal reflux disease without esophagitis: Secondary | ICD-10-CM | POA: Insufficient documentation

## 2015-06-25 DIAGNOSIS — Z9889 Other specified postprocedural states: Secondary | ICD-10-CM | POA: Diagnosis not present

## 2015-06-25 DIAGNOSIS — M1990 Unspecified osteoarthritis, unspecified site: Secondary | ICD-10-CM | POA: Insufficient documentation

## 2015-06-25 DIAGNOSIS — I10 Essential (primary) hypertension: Secondary | ICD-10-CM | POA: Diagnosis not present

## 2015-06-25 DIAGNOSIS — Z7902 Long term (current) use of antithrombotics/antiplatelets: Secondary | ICD-10-CM | POA: Diagnosis not present

## 2015-06-25 DIAGNOSIS — G4700 Insomnia, unspecified: Secondary | ICD-10-CM | POA: Diagnosis not present

## 2015-06-25 LAB — BASIC METABOLIC PANEL
Anion gap: 10 (ref 5–15)
BUN: 27 mg/dL — ABNORMAL HIGH (ref 6–20)
CO2: 24 mmol/L (ref 22–32)
Calcium: 9.2 mg/dL (ref 8.9–10.3)
Chloride: 105 mmol/L (ref 101–111)
Creatinine, Ser: 1.07 mg/dL — ABNORMAL HIGH (ref 0.44–1.00)
GFR calc Af Amer: 52 mL/min — ABNORMAL LOW (ref >60)
GFR calc non Af Amer: 45 mL/min — ABNORMAL LOW (ref >60)
Glucose, Bld: 127 mg/dL — ABNORMAL HIGH (ref 65–99)
Potassium: 3.1 mmol/L — ABNORMAL LOW (ref 3.5–5.1)
Sodium: 139 mmol/L (ref 135–145)

## 2015-06-25 LAB — CBC WITH DIFFERENTIAL/PLATELET
Basophils Absolute: 0 10*3/uL (ref 0.0–0.1)
Basophils Relative: 0 %
Eosinophils Absolute: 0.2 10*3/uL (ref 0.0–0.7)
Eosinophils Relative: 2 %
HCT: 35.2 % — ABNORMAL LOW (ref 36.0–46.0)
Hemoglobin: 11.4 g/dL — ABNORMAL LOW (ref 12.0–15.0)
Lymphocytes Relative: 14 %
Lymphs Abs: 1.1 10*3/uL (ref 0.7–4.0)
MCH: 27.3 pg (ref 26.0–34.0)
MCHC: 32.4 g/dL (ref 30.0–36.0)
MCV: 84.2 fL (ref 78.0–100.0)
Monocytes Absolute: 0.9 10*3/uL (ref 0.1–1.0)
Monocytes Relative: 11 %
Neutro Abs: 5.6 10*3/uL (ref 1.7–7.7)
Neutrophils Relative %: 73 %
Platelets: 219 10*3/uL (ref 150–400)
RBC: 4.18 MIL/uL (ref 3.87–5.11)
RDW: 13.4 % (ref 11.5–15.5)
WBC: 7.8 10*3/uL (ref 4.0–10.5)

## 2015-06-25 MED ORDER — HYDROCOD POLST-CPM POLST ER 10-8 MG/5ML PO SUER
5.0000 mL | Freq: Once | ORAL | Status: AC
Start: 2015-06-25 — End: 2015-06-26
  Administered 2015-06-26: 5 mL via ORAL
  Filled 2015-06-25: qty 5

## 2015-06-25 MED ORDER — IPRATROPIUM BROMIDE 0.02 % IN SOLN
0.5000 mg | Freq: Once | RESPIRATORY_TRACT | Status: AC
  Administered 2015-06-25: 0.5 mg via RESPIRATORY_TRACT
  Filled 2015-06-25: qty 2.5

## 2015-06-25 MED ORDER — METHYLPREDNISOLONE SODIUM SUCC 125 MG IJ SOLR
125.0000 mg | Freq: Once | INTRAMUSCULAR | Status: AC
  Administered 2015-06-25: 125 mg via INTRAVENOUS
  Filled 2015-06-25: qty 2

## 2015-06-25 MED ORDER — ALBUTEROL (5 MG/ML) CONTINUOUS INHALATION SOLN
15.0000 mg/h | INHALATION_SOLUTION | RESPIRATORY_TRACT | Status: AC
  Administered 2015-06-25: 15 mg/h via RESPIRATORY_TRACT
  Filled 2015-06-25: qty 20

## 2015-06-25 NOTE — ED Notes (Signed)
Bed: WA02 Expected date:  Expected time:  Means of arrival:  Comments: Save for triage 2, please

## 2015-06-25 NOTE — ED Notes (Signed)
Pulse ox while ambulating was 96%

## 2015-06-25 NOTE — ED Notes (Signed)
Pt c/o shortness of breath with cough for the past week. Pt has hx of asthma and gets these symptoms every when the seasons change. She has used her nebulizer a few times today with little relief. She denies chest pain, fever, chills. She has some fatigue and weakness. She arrives to triage in wheelchair. She has no acute distress. Skin warm, dry. She is alert/oriented x 4.

## 2015-06-25 NOTE — ED Notes (Signed)
Patient unable to give urine sample at this time. She verbalizes some improvement following breathing treatment but not relief.

## 2015-06-26 DIAGNOSIS — J45901 Unspecified asthma with (acute) exacerbation: Secondary | ICD-10-CM | POA: Diagnosis not present

## 2015-06-26 MED ORDER — ACETAMINOPHEN-CODEINE 120-12 MG/5ML PO SOLN: 10.0000 mL | ORAL | Status: DC | PRN

## 2015-06-26 MED ORDER — PREDNISONE 50 MG PO TABS: 50.0000 mg | ORAL_TABLET | Freq: Every day | ORAL | Status: DC

## 2015-06-26 MED ORDER — GUAIFENESIN ER 1200 MG PO TB12: 1.0000 | ORAL_TABLET | Freq: Two times a day (BID) | ORAL | Status: DC

## 2015-06-26 NOTE — Discharge Instructions (Signed)
Return here as needed.  Follow-up with your primary care doctor, increase your fluid intake °

## 2015-06-26 NOTE — ED Provider Notes (Signed)
CSN: [id number]     Arrival date & time 06/25/15  1805 History   First MD Initiated Contact with Patient 06/25/15 1827     Chief Complaint  Patient presents with  . Shortness of Breath     (Consider location/radiation/quality/duration/timing/severity/associated sxs/prior Treatment) HPI Patient presents to the emergency department with asthma exacerbation is been ongoing over the last 2 weeks.  Patient states that worsened last few days she has been using her nebulized treatments at home without much relief of her symptoms.  Patient, states she has had this happen to her in the past.  She states that she has never been intubated for her asthma.  Patient states that nothing seems make her condition better, but exertion makes her asthma worse.  Patient states he does not have any chest pain, nausea, vomiting, weakness, dizziness, headache, blurred vision, fever, runny nose, sore throat, back pain, neck pain, rash, lightheadedness or syncope.  The patient states that she did not take any other medications for asthma other than her prescribed medications Past Medical History  Diagnosis Date  . Asthma   . Coronary artery disease   . Diabetes mellitus without complication ([place])   . Hypothyroidism   . GERD (gastroesophageal reflux disease)   . Insomnia   . Anxiety   . Hypertension   . CAD (coronary artery disease)   . Osteopenia   . Osteoarthritis   . H/O echocardiogram 06-26-2012    EF 55%  . H/O cardiovascular stress test 06-26-2012    compared to previous study there is no significant change; normal myocardial perfusion study. no significant ischemia demonstrated . this is a low risk scan  . History of cardiac cath 09-30-1998    cath done by Dr. Claiborne Billings, successful rotation atherectomy of the left anterior decending artert with reduction from 80 to105  . Hx of cardiac cath 09-26-1998    dignostic cath dr. Claiborne Billings to do rotoblator at a later date  . History of Doppler ultrasound 03-30-2008    neg  abd. doppler for aneurysm  . History of Holter monitoring 12-22-2007    NSR with pac occs. pvc's   Past Surgical History  Procedure Laterality Date  . Appendectomy  1947  . Dilation and curettage of uterus  1960  . Vesicovaginal fistula closure w/ tah  1971    Dr Mallie Mussel  . Bladder tack  1970    Dr Rosana Hoes  . Breast lumpectomy  1970    Left, Dr Leandrew Koyanagi  . Nasal sinus surgery  1980    Left; for chronic sinusitis  . Revision total knee arthroplasty  2005    Dr Alvan Dame Right knee   Family History  Problem Relation Age of Onset  . Diabetes Father   . Parkinson's disease Father   . Heart disease Father   . Asthma Father   . Diabetes Mother   . Heart disease Mother   . Heart disease Brother   . Diabetes Brother   . Diabetes Brother   . Diabetes Brother    Social History  Substance Use Topics  . Smoking status: Never Smoker   . Smokeless tobacco: Never Used  . Alcohol Use: No   OB History    No data available     Review of Systems All other systems negative except as documented in the HPI. All pertinent positives and negatives as reviewed in the HPI.   Allergies  Review of patient's allergies indicates no known allergies.  Home Medications   Prior to  Admission medications   Medication Sig Start Date End Date Taking? Authorizing Provider  acetaminophen (TYLENOL) 500 MG tablet Take 500 mg by mouth every 6 (six) hours as needed for moderate pain or headache.   Yes Historical Provider, MD  acetaminophen-codeine (TYLENOL #3) 300-30 MG per tablet Take 1 tablet by mouth as needed for moderate pain.  04/30/14  Yes Historical Provider, MD  albuterol (PROVENTIL) (2.5 MG/3ML) 0.083% nebulizer solution Take 3 mLs (2.5 mg total) by nebulization every 6 (six) hours as needed for wheezing. 05/02/15  Yes Deneise Lever, MD  aspirin EC 81 MG tablet Take 81 mg by mouth at bedtime.    Yes Historical Provider, MD  azelastine (ASTELIN) 137 MCG/SPRAY nasal spray Place 1 spray into the nose 2 (two)  times daily. Use in each nostril as directed   Yes Historical Provider, MD  benzonatate (TESSALON) 200 MG capsule Take 1 capsule (200 mg total) by mouth 3 (three) times daily as needed for cough. 05/02/15  Yes Deneise Lever, MD  clopidogrel (PLAVIX) 75 MG tablet Take 75 mg by mouth daily.   Yes Historical Provider, MD  clorazepate (TRANXENE) 15 MG tablet Take 15 mg by mouth at bedtime. anxiety   Yes Historical Provider, MD  ezetimibe (ZETIA) 10 MG tablet Take 10 mg by mouth at bedtime.   Yes Historical Provider, MD  glimepiride (AMARYL) 1 MG tablet Take 1 mg by mouth daily before breakfast.   Yes Historical Provider, MD  ibuprofen (ADVIL,MOTRIN) 200 MG tablet Take 400 mg by mouth every 6 (six) hours as needed for headache or moderate pain.   Yes Historical Provider, MD  levothyroxine (SYNTHROID, LEVOTHROID) 50 MCG tablet Take 25 mcg by mouth daily.    Yes Historical Provider, MD  LORazepam (ATIVAN) 1 MG tablet Take 1 mg by mouth every 8 (eight) hours as needed for anxiety.   Yes Historical Provider, MD  metoprolol succinate (TOPROL-XL) 50 MG 24 hr tablet TAKE 2 TABLETS BY MOUTH ONCE DAILY WITH OR IMMEDIATELY FOLLOWING A MEAL 08/05/14  Yes Troy Sine, MD  MICARDIS HCT 80-25 MG per tablet Take 1 tablet by mouth daily. 08/01/12  Yes Historical Provider, MD  montelukast (SINGULAIR) 10 MG tablet Take 10 mg by mouth at bedtime.   Yes Historical Provider, MD  Multiple Vitamin (MULTIVITAMIN WITH MINERALS) TABS tablet Take 1 tablet by mouth daily.   Yes Historical Provider, MD  nitroGLYCERIN (NITROSTAT) 0.4 MG SL tablet Place 0.4 mg under the tongue every 5 (five) minutes as needed. For chest pain   Yes Historical Provider, MD  pantoprazole (PROTONIX) 40 MG tablet Take 40 mg by mouth 2 (two) times daily. 05/31/15  Yes Historical Provider, MD  PROAIR HFA 108 (90 BASE) MCG/ACT inhaler Inhale 1 puff into the lungs as needed.  05/11/14  Yes Historical Provider, MD  QUEtiapine (SEROQUEL) 25 MG tablet Take 50  mg by mouth daily with breakfast.   Yes Historical Provider, MD  rosuvastatin (CRESTOR) 20 MG tablet Take 20 mg by mouth at bedtime.    Yes Historical Provider, MD  topiramate (TOPAMAX) 25 MG tablet Take 25 mg by mouth at bedtime.   Yes Historical Provider, MD  Umeclidinium-Vilanterol (ANORO ELLIPTA) 62.5-25 MCG/INH AEPB Inhale 1 puff into the lungs daily. 01/27/15  Yes Deneise Lever, MD   BP 126/62 mmHg  Pulse 90  Temp(Src) 98.5 F (36.9 C) (Oral)  Resp 16  SpO2 99% Physical Exam  Constitutional: She is oriented to person, place, and time. She  appears well-developed and well-nourished. No distress.  HENT:  Head: Normocephalic and atraumatic.  Mouth/Throat: Oropharynx is clear and moist.  Eyes: Pupils are equal, round, and reactive to light.  Neck: Normal range of motion. Neck supple.  Cardiovascular: Normal rate, regular rhythm and normal heart sounds.  Exam reveals no gallop and no friction rub.   No murmur heard. Pulmonary/Chest: Effort normal. No respiratory distress. She has wheezes.  Abdominal: Soft. Bowel sounds are normal. She exhibits no distension. There is no tenderness.  Neurological: She is alert and oriented to person, place, and time. She exhibits normal muscle tone. Coordination normal.  Skin: Skin is warm and dry. No rash noted. No erythema.  Psychiatric: She has a normal mood and affect. Her behavior is normal.  Nursing note and vitals reviewed.   ED Course  Procedures (including critical care time) Labs Review Labs Reviewed  BASIC METABOLIC PANEL - Abnormal; Notable for the following:    Potassium 3.1 (*)    Glucose, Bld 127 (*)    BUN 27 (*)    Creatinine, Ser 1.07 (*)    GFR calc non Af Amer 45 (*)    GFR calc Af Amer 52 (*)    All other components within normal limits  CBC WITH DIFFERENTIAL/PLATELET - Abnormal; Notable for the following:    Hemoglobin 11.4 (*)    HCT 35.2 (*)    All other components within normal limits  URINALYSIS, ROUTINE W REFLEX  MICROSCOPIC (NOT AT [facility])    Imaging Review Dg Chest 2 View  06/25/2015  CLINICAL DATA:  Asthma x 1 week, cough with phlegm x 6 months HTN,diabetes Hx  Of coronary artery disease and cardiac cath-2000 EXAM: CHEST  2 VIEW COMPARISON:  01/20/2015 FINDINGS: Cardiac silhouette is top-normal in size. No mediastinal or hilar masses or evidence of adenopathy. Lungs are mildly hyperexpanded but clear. No pleural effusion or pneumothorax. Bony thorax is demineralized intact. No change from the prior study. IMPRESSION: No active cardiopulmonary disease. Electronically Signed   By: Lajean Manes M.D.   On: 06/25/2015 19:29   I have personally reviewed and evaluated these images and lab results as part of my medical decision-making.   EKG Interpretation None      Patient improved dramatically following the hour-long neb treatment and her fluids.  Patient is advised return here as needed.  I also asked her to follow-up with her primary care doctor.  She is also asked to return here as needed for worsening in her condition.  She was ambulated and her pulse oximetries in the upper 90s   I reassessed the patient and she had no current wheezing at this time  Dalia Heading, PA-C 06/27/15 0235  Leo Grosser, MD 07/01/15 [phone]

## 2015-07-11 DIAGNOSIS — G894 Chronic pain syndrome: Secondary | ICD-10-CM | POA: Diagnosis not present

## 2015-07-11 DIAGNOSIS — R490 Dysphonia: Secondary | ICD-10-CM | POA: Diagnosis not present

## 2015-07-11 DIAGNOSIS — E7800 Pure hypercholesterolemia, unspecified: Secondary | ICD-10-CM | POA: Diagnosis not present

## 2015-07-11 DIAGNOSIS — J029 Acute pharyngitis, unspecified: Secondary | ICD-10-CM | POA: Diagnosis not present

## 2015-07-11 DIAGNOSIS — R05 Cough: Secondary | ICD-10-CM | POA: Diagnosis not present

## 2015-07-11 DIAGNOSIS — M5136 Other intervertebral disc degeneration, lumbar region: Secondary | ICD-10-CM | POA: Diagnosis not present

## 2015-07-15 ENCOUNTER — Encounter (HOSPITAL_COMMUNITY): Payer: Self-pay | Admitting: Emergency Medicine

## 2015-07-15 ENCOUNTER — Inpatient Hospital Stay (HOSPITAL_COMMUNITY)
Admission: EM | Admit: 2015-07-15 | Discharge: 2015-07-19 | DRG: 871 | Disposition: A | Discharge status: Skilled Nursing Facility | Payer: Medicare Other | Attending: Internal Medicine | Admitting: Internal Medicine

## 2015-07-15 ENCOUNTER — Emergency Department (HOSPITAL_COMMUNITY): Payer: Medicare Other

## 2015-07-15 DIAGNOSIS — M8580 Other specified disorders of bone density and structure, unspecified site: Secondary | ICD-10-CM | POA: Diagnosis present

## 2015-07-15 DIAGNOSIS — K5900 Constipation, unspecified: Secondary | ICD-10-CM | POA: Diagnosis present

## 2015-07-15 DIAGNOSIS — A419 Sepsis, unspecified organism: Principal | ICD-10-CM

## 2015-07-15 DIAGNOSIS — E119 Type 2 diabetes mellitus without complications: Secondary | ICD-10-CM | POA: Diagnosis not present

## 2015-07-15 DIAGNOSIS — M1990 Unspecified osteoarthritis, unspecified site: Secondary | ICD-10-CM | POA: Diagnosis present

## 2015-07-15 DIAGNOSIS — E785 Hyperlipidemia, unspecified: Secondary | ICD-10-CM

## 2015-07-15 DIAGNOSIS — R2681 Unsteadiness on feet: Secondary | ICD-10-CM | POA: Diagnosis not present

## 2015-07-15 DIAGNOSIS — Z90710 Acquired absence of both cervix and uterus: Secondary | ICD-10-CM | POA: Diagnosis not present

## 2015-07-15 DIAGNOSIS — J181 Lobar pneumonia, unspecified organism: Secondary | ICD-10-CM | POA: Diagnosis present

## 2015-07-15 DIAGNOSIS — I10 Essential (primary) hypertension: Secondary | ICD-10-CM

## 2015-07-15 DIAGNOSIS — Z79899 Other long term (current) drug therapy: Secondary | ICD-10-CM

## 2015-07-15 DIAGNOSIS — Z96651 Presence of right artificial knee joint: Secondary | ICD-10-CM | POA: Diagnosis present

## 2015-07-15 DIAGNOSIS — Z7902 Long term (current) use of antithrombotics/antiplatelets: Secondary | ICD-10-CM | POA: Diagnosis not present

## 2015-07-15 DIAGNOSIS — Z833 Family history of diabetes mellitus: Secondary | ICD-10-CM | POA: Diagnosis not present

## 2015-07-15 DIAGNOSIS — Z8249 Family history of ischemic heart disease and other diseases of the circulatory system: Secondary | ICD-10-CM | POA: Diagnosis not present

## 2015-07-15 DIAGNOSIS — N179 Acute kidney failure, unspecified: Secondary | ICD-10-CM | POA: Diagnosis present

## 2015-07-15 DIAGNOSIS — J45901 Unspecified asthma with (acute) exacerbation: Secondary | ICD-10-CM

## 2015-07-15 DIAGNOSIS — R069 Unspecified abnormalities of breathing: Secondary | ICD-10-CM | POA: Diagnosis not present

## 2015-07-15 DIAGNOSIS — K219 Gastro-esophageal reflux disease without esophagitis: Secondary | ICD-10-CM | POA: Diagnosis present

## 2015-07-15 DIAGNOSIS — Z7982 Long term (current) use of aspirin: Secondary | ICD-10-CM

## 2015-07-15 DIAGNOSIS — E872 Acidosis, unspecified: Secondary | ICD-10-CM

## 2015-07-15 DIAGNOSIS — Z820 Family history of epilepsy and other diseases of the nervous system: Secondary | ICD-10-CM | POA: Diagnosis not present

## 2015-07-15 DIAGNOSIS — Z825 Family history of asthma and other chronic lower respiratory diseases: Secondary | ICD-10-CM | POA: Diagnosis not present

## 2015-07-15 DIAGNOSIS — I2510 Atherosclerotic heart disease of native coronary artery without angina pectoris: Secondary | ICD-10-CM | POA: Diagnosis present

## 2015-07-15 DIAGNOSIS — M6281 Muscle weakness (generalized): Secondary | ICD-10-CM | POA: Diagnosis not present

## 2015-07-15 DIAGNOSIS — R05 Cough: Secondary | ICD-10-CM | POA: Diagnosis not present

## 2015-07-15 DIAGNOSIS — E039 Hypothyroidism, unspecified: Secondary | ICD-10-CM | POA: Diagnosis present

## 2015-07-15 DIAGNOSIS — I959 Hypotension, unspecified: Secondary | ICD-10-CM | POA: Diagnosis present

## 2015-07-15 DIAGNOSIS — Z79891 Long term (current) use of opiate analgesic: Secondary | ICD-10-CM | POA: Diagnosis not present

## 2015-07-15 DIAGNOSIS — F419 Anxiety disorder, unspecified: Secondary | ICD-10-CM | POA: Diagnosis present

## 2015-07-15 DIAGNOSIS — J189 Pneumonia, unspecified organism: Secondary | ICD-10-CM

## 2015-07-15 DIAGNOSIS — R509 Fever, unspecified: Secondary | ICD-10-CM | POA: Diagnosis not present

## 2015-07-15 DIAGNOSIS — Z5189 Encounter for other specified aftercare: Secondary | ICD-10-CM | POA: Diagnosis not present

## 2015-07-15 LAB — CREATININE, SERUM
Creatinine, Ser: 1.22 mg/dL — ABNORMAL HIGH (ref 0.44–1.00)
GFR, EST AFRICAN AMERICAN: 44 mL/min — AB (ref >60)
GFR, EST NON AFRICAN AMERICAN: 38 mL/min — AB (ref >60)

## 2015-07-15 LAB — URINALYSIS, ROUTINE W REFLEX MICROSCOPIC
Bilirubin Urine: NEGATIVE
GLUCOSE, UA: NEGATIVE mg/dL
Hgb urine dipstick: NEGATIVE
KETONES UR: NEGATIVE mg/dL
LEUKOCYTES UA: NEGATIVE
NITRITE: NEGATIVE
PH: 5.5 (ref 5.0–8.0)
Protein, ur: NEGATIVE mg/dL
SPECIFIC GRAVITY, URINE: 1.019 (ref 1.005–1.030)

## 2015-07-15 LAB — CBC WITH DIFFERENTIAL/PLATELET
BASOS ABS: 0 10*3/uL (ref 0.0–0.1)
BASOS PCT: 0 %
EOS PCT: 1 %
Eosinophils Absolute: 0.1 10*3/uL (ref 0.0–0.7)
HEMATOCRIT: 36.1 % (ref 36.0–46.0)
HEMOGLOBIN: 11 g/dL — AB (ref 12.0–15.0)
LYMPHS PCT: 9 %
Lymphs Abs: 1.1 10*3/uL (ref 0.7–4.0)
MCH: 26.3 pg (ref 26.0–34.0)
MCHC: 30.5 g/dL (ref 30.0–36.0)
MCV: 86.4 fL (ref 78.0–100.0)
MONOS PCT: 5 %
Monocytes Absolute: 0.6 10*3/uL (ref 0.1–1.0)
NEUTROS ABS: 10.1 10*3/uL — AB (ref 1.7–7.7)
Neutrophils Relative %: 85 %
Platelets: 166 10*3/uL (ref 150–400)
RBC: 4.18 MIL/uL (ref 3.87–5.11)
RDW: 13.9 % (ref 11.5–15.5)
WBC: 11.9 10*3/uL — ABNORMAL HIGH (ref 4.0–10.5)

## 2015-07-15 LAB — COMPREHENSIVE METABOLIC PANEL
ALBUMIN: 3.4 g/dL — AB (ref 3.5–5.0)
ALT: 23 U/L (ref 14–54)
ANION GAP: 11 (ref 5–15)
AST: 26 U/L (ref 15–41)
Alkaline Phosphatase: 51 U/L (ref 38–126)
BUN: 31 mg/dL — AB (ref 6–20)
CHLORIDE: 106 mmol/L (ref 101–111)
CO2: 24 mmol/L (ref 22–32)
Calcium: 8.8 mg/dL — ABNORMAL LOW (ref 8.9–10.3)
Creatinine, Ser: 1.21 mg/dL — ABNORMAL HIGH (ref 0.44–1.00)
GFR calc Af Amer: 45 mL/min — ABNORMAL LOW (ref >60)
GFR calc non Af Amer: 39 mL/min — ABNORMAL LOW (ref >60)
GLUCOSE: 218 mg/dL — AB (ref 65–99)
POTASSIUM: 3.4 mmol/L — AB (ref 3.5–5.1)
SODIUM: 141 mmol/L (ref 135–145)
TOTAL PROTEIN: 6.7 g/dL (ref 6.5–8.1)
Total Bilirubin: 0.5 mg/dL (ref 0.3–1.2)

## 2015-07-15 LAB — BRAIN NATRIURETIC PEPTIDE: B NATRIURETIC PEPTIDE 5: 44.2 pg/mL (ref 0.0–100.0)

## 2015-07-15 LAB — CBC
HCT: 31.6 % — ABNORMAL LOW (ref 36.0–46.0)
Hemoglobin: 10.1 g/dL — ABNORMAL LOW (ref 12.0–15.0)
MCH: 27.4 pg (ref 26.0–34.0)
MCHC: 32 g/dL (ref 30.0–36.0)
MCV: 85.6 fL (ref 78.0–100.0)
PLATELETS: 157 10*3/uL (ref 150–400)
RBC: 3.69 MIL/uL — AB (ref 3.87–5.11)
RDW: 13.9 % (ref 11.5–15.5)
WBC: 11.5 10*3/uL — AB (ref 4.0–10.5)

## 2015-07-15 LAB — I-STAT TROPONIN, ED: Troponin i, poc: 0.03 ng/mL (ref 0.00–0.08)

## 2015-07-15 LAB — GLUCOSE, CAPILLARY: Glucose-Capillary: 359 mg/dL — ABNORMAL HIGH (ref 65–99)

## 2015-07-15 LAB — I-STAT CG4 LACTIC ACID, ED: LACTIC ACID, VENOUS: 3.5 mmol/L — AB (ref 0.5–2.0)

## 2015-07-15 LAB — PROCALCITONIN: PROCALCITONIN: 0.35 ng/mL

## 2015-07-15 MED ORDER — GUAIFENESIN ER 600 MG PO TB12
1200.0000 mg | ORAL_TABLET | Freq: Two times a day (BID) | ORAL | Status: DC
  Administered 2015-07-15 – 2015-07-19 (×8): 1200 mg via ORAL
  Filled 2015-07-15 (×10): qty 2

## 2015-07-15 MED ORDER — ACETAMINOPHEN 325 MG PO TABS
650.0000 mg | ORAL_TABLET | Freq: Four times a day (QID) | ORAL | Status: DC | PRN
Start: 2015-07-15 — End: 2015-07-19

## 2015-07-15 MED ORDER — BENZONATATE 100 MG PO CAPS
200.0000 mg | ORAL_CAPSULE | Freq: Three times a day (TID) | ORAL | Status: DC | PRN
  Administered 2015-07-16: 200 mg via ORAL
  Filled 2015-07-15: qty 2

## 2015-07-15 MED ORDER — ASPIRIN EC 81 MG PO TBEC
81.0000 mg | DELAYED_RELEASE_TABLET | Freq: Every day | ORAL | Status: DC
  Administered 2015-07-15 – 2015-07-18 (×4): 81 mg via ORAL
  Filled 2015-07-15 (×4): qty 1

## 2015-07-15 MED ORDER — DEXTROSE 5 % IV SOLN
1.0000 g | INTRAVENOUS | Status: DC
  Administered 2015-07-16 – 2015-07-17 (×2): 1 g via INTRAVENOUS
  Filled 2015-07-15 (×3): qty 10

## 2015-07-15 MED ORDER — METHYLPREDNISOLONE SODIUM SUCC 125 MG IJ SOLR
60.0000 mg | Freq: Two times a day (BID) | INTRAMUSCULAR | Status: DC
  Administered 2015-07-15 – 2015-07-17 (×5): 60 mg via INTRAVENOUS
  Filled 2015-07-15 (×2): qty 2
  Filled 2015-07-15 (×4): qty 0.96

## 2015-07-15 MED ORDER — IRBESARTAN 300 MG PO TABS
300.0000 mg | ORAL_TABLET | Freq: Every day | ORAL | Status: DC
  Administered 2015-07-16 – 2015-07-19 (×4): 300 mg via ORAL
  Filled 2015-07-15 (×4): qty 1

## 2015-07-15 MED ORDER — IPRATROPIUM-ALBUTEROL 0.5-2.5 (3) MG/3ML IN SOLN: 3.0000 mL | RESPIRATORY_TRACT | Status: DC | PRN

## 2015-07-15 MED ORDER — SODIUM CHLORIDE 0.9 % IV SOLN
INTRAVENOUS | Status: DC
  Administered 2015-07-15 – 2015-07-18 (×5): via INTRAVENOUS

## 2015-07-15 MED ORDER — DEXTROSE 5 % IV SOLN
1.0000 g | Freq: Once | INTRAVENOUS | Status: AC
  Administered 2015-07-15: 1 g via INTRAVENOUS
  Filled 2015-07-15: qty 10

## 2015-07-15 MED ORDER — ACETAMINOPHEN 500 MG PO TABS
1000.0000 mg | ORAL_TABLET | Freq: Once | ORAL | Status: AC
  Administered 2015-07-15: 1000 mg via ORAL
  Filled 2015-07-15: qty 2

## 2015-07-15 MED ORDER — HYDROCHLOROTHIAZIDE 25 MG PO TABS
25.0000 mg | ORAL_TABLET | Freq: Every day | ORAL | Status: DC
  Administered 2015-07-16 – 2015-07-19 (×4): 25 mg via ORAL
  Filled 2015-07-15 (×4): qty 1

## 2015-07-15 MED ORDER — ACETAMINOPHEN 650 MG RE SUPP: 650.0000 mg | Freq: Four times a day (QID) | RECTAL | Status: DC | PRN

## 2015-07-15 MED ORDER — ROSUVASTATIN CALCIUM 20 MG PO TABS
20.0000 mg | ORAL_TABLET | Freq: Every day | ORAL | Status: DC
  Administered 2015-07-15 – 2015-07-18 (×4): 20 mg via ORAL
  Filled 2015-07-15 (×4): qty 1

## 2015-07-15 MED ORDER — SODIUM CHLORIDE 0.9 % IJ SOLN
3.0000 mL | Freq: Two times a day (BID) | INTRAMUSCULAR | Status: DC
  Administered 2015-07-15 – 2015-07-18 (×3): 3 mL via INTRAVENOUS

## 2015-07-15 MED ORDER — TELMISARTAN-HCTZ 80-25 MG PO TABS
1.0000 | ORAL_TABLET | Freq: Every day | ORAL | Status: DC
Start: 2015-07-15 — End: 2015-07-15

## 2015-07-15 MED ORDER — QUETIAPINE FUMARATE 50 MG PO TABS
50.0000 mg | ORAL_TABLET | Freq: Every day | ORAL | Status: DC
  Administered 2015-07-16 – 2015-07-19 (×4): 50 mg via ORAL
  Filled 2015-07-15 (×4): qty 1

## 2015-07-15 MED ORDER — IPRATROPIUM-ALBUTEROL 0.5-2.5 (3) MG/3ML IN SOLN
3.0000 mL | Freq: Four times a day (QID) | RESPIRATORY_TRACT | Status: DC
  Administered 2015-07-15 – 2015-07-16 (×5): 3 mL via RESPIRATORY_TRACT
  Filled 2015-07-15 (×5): qty 3

## 2015-07-15 MED ORDER — ONDANSETRON HCL 4 MG PO TABS: 4.0000 mg | ORAL_TABLET | Freq: Four times a day (QID) | ORAL | Status: DC | PRN

## 2015-07-15 MED ORDER — ONDANSETRON HCL 4 MG/2ML IJ SOLN: 4.0000 mg | Freq: Four times a day (QID) | INTRAMUSCULAR | Status: DC | PRN

## 2015-07-15 MED ORDER — TOPIRAMATE 25 MG PO TABS
25.0000 mg | ORAL_TABLET | Freq: Every day | ORAL | Status: DC
  Administered 2015-07-15 – 2015-07-18 (×4): 25 mg via ORAL
  Filled 2015-07-15 (×5): qty 1

## 2015-07-15 MED ORDER — SODIUM CHLORIDE 0.9 % IV BOLUS (SEPSIS): 1000.0000 mL | Freq: Once | INTRAVENOUS | Status: DC

## 2015-07-15 MED ORDER — AZITHROMYCIN 500 MG IV SOLR
500.0000 mg | INTRAVENOUS | Status: DC
  Administered 2015-07-16 – 2015-07-17 (×2): 500 mg via INTRAVENOUS
  Filled 2015-07-15 (×3): qty 500

## 2015-07-15 MED ORDER — LEVOTHYROXINE SODIUM 25 MCG PO TABS
25.0000 ug | ORAL_TABLET | Freq: Every day | ORAL | Status: DC
  Administered 2015-07-16 – 2015-07-19 (×4): 25 ug via ORAL
  Filled 2015-07-15 (×4): qty 1

## 2015-07-15 MED ORDER — IPRATROPIUM-ALBUTEROL 0.5-2.5 (3) MG/3ML IN SOLN
3.0000 mL | Freq: Once | RESPIRATORY_TRACT | Status: AC
  Administered 2015-07-15: 3 mL via RESPIRATORY_TRACT
  Filled 2015-07-15: qty 3

## 2015-07-15 MED ORDER — CLOPIDOGREL BISULFATE 75 MG PO TABS
75.0000 mg | ORAL_TABLET | Freq: Every day | ORAL | Status: DC
  Administered 2015-07-16 – 2015-07-19 (×4): 75 mg via ORAL
  Filled 2015-07-15 (×4): qty 1

## 2015-07-15 MED ORDER — CLORAZEPATE DIPOTASSIUM 7.5 MG PO TABS
15.0000 mg | ORAL_TABLET | Freq: Every day | ORAL | Status: DC
  Administered 2015-07-15 – 2015-07-18 (×4): 15 mg via ORAL
  Filled 2015-07-15 (×4): qty 2

## 2015-07-15 MED ORDER — HEPARIN SODIUM (PORCINE) 5000 UNIT/ML IJ SOLN
5000.0000 [IU] | Freq: Three times a day (TID) | INTRAMUSCULAR | Status: DC
  Administered 2015-07-15 – 2015-07-19 (×11): 5000 [IU] via SUBCUTANEOUS
  Filled 2015-07-15 (×13): qty 1

## 2015-07-15 MED ORDER — METOPROLOL SUCCINATE ER 50 MG PO TB24: 50.0000 mg | ORAL_TABLET | Freq: Every day | ORAL | Status: DC

## 2015-07-15 MED ORDER — SODIUM CHLORIDE 0.9 % IV BOLUS (SEPSIS)
1000.0000 mL | Freq: Once | INTRAVENOUS | Status: AC
  Administered 2015-07-15: 1000 mL via INTRAVENOUS

## 2015-07-15 MED ORDER — PANTOPRAZOLE SODIUM 40 MG PO TBEC
40.0000 mg | DELAYED_RELEASE_TABLET | Freq: Two times a day (BID) | ORAL | Status: DC
  Administered 2015-07-15 – 2015-07-19 (×8): 40 mg via ORAL
  Filled 2015-07-15 (×11): qty 1

## 2015-07-15 MED ORDER — EZETIMIBE 10 MG PO TABS
10.0000 mg | ORAL_TABLET | Freq: Every day | ORAL | Status: DC
  Administered 2015-07-15 – 2015-07-18 (×4): 10 mg via ORAL
  Filled 2015-07-15 (×5): qty 1

## 2015-07-15 MED ORDER — INSULIN ASPART 100 UNIT/ML ~~LOC~~ SOLN
0.0000 [IU] | Freq: Three times a day (TID) | SUBCUTANEOUS | Status: DC
  Administered 2015-07-16 (×3): 5 [IU] via SUBCUTANEOUS
  Administered 2015-07-17: 3 [IU] via SUBCUTANEOUS
  Administered 2015-07-17: 5 [IU] via SUBCUTANEOUS
  Administered 2015-07-17: 3 [IU] via SUBCUTANEOUS
  Administered 2015-07-18: 5 [IU] via SUBCUTANEOUS
  Administered 2015-07-18: 3 [IU] via SUBCUTANEOUS
  Administered 2015-07-18: 2 [IU] via SUBCUTANEOUS
  Administered 2015-07-19: 3 [IU] via SUBCUTANEOUS
  Administered 2015-07-19: 2 [IU] via SUBCUTANEOUS

## 2015-07-15 MED ORDER — LORAZEPAM 1 MG PO TABS: 1.0000 mg | ORAL_TABLET | Freq: Three times a day (TID) | ORAL | Status: DC | PRN

## 2015-07-15 MED ORDER — DEXTROSE 5 % IV SOLN
500.0000 mg | Freq: Once | INTRAVENOUS | Status: AC
  Administered 2015-07-15: 500 mg via INTRAVENOUS
  Filled 2015-07-15: qty 500

## 2015-07-15 MED ORDER — INSULIN ASPART 100 UNIT/ML ~~LOC~~ SOLN
0.0000 [IU] | Freq: Every day | SUBCUTANEOUS | Status: DC
  Administered 2015-07-15: 5 [IU] via SUBCUTANEOUS
  Administered 2015-07-16 – 2015-07-17 (×2): 2 [IU] via SUBCUTANEOUS

## 2015-07-15 NOTE — H&P (Addendum)
[facility] History and Physical  [name] [id number] DOB: [birth year] DOA: 07/15/2015  Referring physician: Emergency department PCP: [name], MD  Specialists:   Chief Complaint: cough, fevers  HPI: [name] is a 81 y.o. female with a hx of asthma, DM2, CAD, HTN, who presents to the ED with worsening productive cough, wheezing and sob. Pt was seen in the ED 2 weeks prior to admit and was discharged for asthma exacerbation.  In the ED, pt was found to be febrile with tmax of 102.20F rectally, hypotensive, and with lactate of 3.5. IVF hydration was initiated and patient was started on empiric azithromycin and rocephin in the ED. Pt was also given duoneb with one dose of IV steroids already given by EMS in the field  Given acuity of illness, hospitalist service was consulted for consideration for admission.  Review of Systems:  Review of Systems  Constitutional: Positive for fever and malaise/fatigue.  HENT: Negative for ear pain and tinnitus.   Eyes: Negative for pain and discharge.  Respiratory: Positive for cough, shortness of breath and wheezing.   Cardiovascular: Negative for chest pain and palpitations.  Gastrointestinal: Negative for vomiting and abdominal pain.  Genitourinary: Positive for hematuria. Negative for flank pain.  Musculoskeletal: Negative for joint pain and neck pain.  Neurological: Negative for tingling, tremors and loss of consciousness.  Psychiatric/Behavioral: Negative for hallucinations and memory loss.     Past Medical History  Diagnosis Date  . Asthma   . Coronary artery disease   . Diabetes mellitus without complication ([place])   . Hypothyroidism   . GERD (gastroesophageal reflux disease)   . Insomnia   . Anxiety   . Hypertension   . CAD (coronary artery disease)   . Osteopenia   . Osteoarthritis   . H/O echocardiogram 06-26-2012    EF 55%  . H/O cardiovascular stress test 06-26-2012    compared to previous study there is no  significant change; normal myocardial perfusion study. no significant ischemia demonstrated . this is a low risk scan  . History of cardiac cath 09-30-1998    cath done by Dr. Claiborne Billings, successful rotation atherectomy of the left anterior decending artert with reduction from 80 to105  . Hx of cardiac cath 09-26-1998    dign[facility] cath dr. Claiborne Billings to do rotoblator at a later date  . History of Doppler ultrasound 03-30-2008    neg abd. doppler for aneurysm  . History of Holter monitoring 12-22-2007    NSR with pac occs. pvc's   Past Surgical History  Procedure Laterality Date  . Appendectomy  1947  . Dilation and curettage of uterus  1960  . Vesicovaginal fistula closure w/ tah  1971    Dr Mallie Mussel  . Bladder tack  1970    Dr Rosana Hoes  . Breast lumpectomy  1970    Left, Dr Leandrew Koyanagi  . Nasal sinus surgery  1980    Left; for chronic sinusitis  . Revision total knee arthroplasty  2005    Dr Alvan Dame Right knee   Social History:  reports that she has never smoked. She has never used smokeless tobacco. She reports that she does not drink alcohol or use illicit drugs.  where does patient live--home, ALF, SNF? and with whom if at home?  Can patient participate in ADLs?  No Known Allergies  Family History  Problem Relation Age of Onset  . Diabetes Father   . Parkinson's disease Father   . Heart disease Father   .  Asthma Father   . Diabetes Mother   . Heart disease Mother   . Heart disease Brother   . Diabetes Brother   . Diabetes Brother   . Diabetes Brother      Prior to Admission medications   Medication Sig Start Date End Date Taking? Authorizing Provider  acetaminophen (TYLENOL) 500 MG tablet Take 500 mg by mouth every 6 (six) hours as needed for moderate pain or headache.   Yes Historical Provider, MD  acetaminophen-codeine (TYLENOL #3) 300-30 MG tablet Take 1 tablet by mouth 3 (three) times daily as needed. Pain 07/11/15  Yes Historical Provider, MD  acetaminophen-codeine 120-12 MG/5ML solution  Take 10 mLs by mouth every 4 (four) hours as needed (cough). 06/26/15  Yes Christopher Lawyer, PA-C  albuterol (PROVENTIL) (2.5 MG/3ML) 0.083% nebulizer solution Take 3 mLs (2.5 mg total) by nebulization every 6 (six) hours as needed for wheezing. 05/02/15  Yes Deneise Lever, MD  aspirin EC 81 MG tablet Take 81 mg by mouth at bedtime.    Yes Historical Provider, MD  azelastine (ASTELIN) 137 MCG/SPRAY nasal spray Place 1 spray into the nose 2 (two) times daily. Use in each nostril as directed   Yes Historical Provider, MD  benzonatate (TESSALON) 200 MG capsule Take 1 capsule (200 mg total) by mouth 3 (three) times daily as needed for cough. 05/02/15  Yes Deneise Lever, MD  clopidogrel (PLAVIX) 75 MG tablet Take 75 mg by mouth daily.   Yes Historical Provider, MD  clorazepate (TRANXENE) 15 MG tablet Take 15 mg by mouth at bedtime. anxiety   Yes Historical Provider, MD  ezetimibe (ZETIA) 10 MG tablet Take 10 mg by mouth at bedtime.   Yes Historical Provider, MD  glimepiride (AMARYL) 1 MG tablet Take 1 mg by mouth daily before breakfast.   Yes Historical Provider, MD  Guaifenesin 1200 MG TB12 Take 1 tablet (1,200 mg total) by mouth 2 (two) times daily. 06/26/15  Yes Christopher Lawyer, PA-C  ibuprofen (ADVIL,MOTRIN) 200 MG tablet Take 400 mg by mouth every 6 (six) hours as needed for headache or moderate pain.   Yes Historical Provider, MD  levothyroxine (SYNTHROID, LEVOTHROID) 50 MCG tablet Take 25 mcg by mouth daily.    Yes Historical Provider, MD  LORazepam (ATIVAN) 1 MG tablet Take 1 mg by mouth every 8 (eight) hours as needed for anxiety.   Yes Historical Provider, MD  metoprolol succinate (TOPROL-XL) 50 MG 24 hr tablet TAKE 2 TABLETS BY MOUTH ONCE DAILY WITH OR IMMEDIATELY FOLLOWING A MEAL 08/05/14  Yes Troy Sine, MD  MICARDIS HCT 80-25 MG per tablet Take 1 tablet by mouth daily. 08/01/12  Yes Historical Provider, MD  montelukast (SINGULAIR) 10 MG tablet Take 10 mg by mouth at bedtime.   Yes  Historical Provider, MD  Multiple Vitamin (MULTIVITAMIN WITH MINERALS) TABS tablet Take 1 tablet by mouth daily.   Yes Historical Provider, MD  nitroGLYCERIN (NITROSTAT) 0.4 MG SL tablet Place 0.4 mg under the tongue every 5 (five) minutes as needed. For chest pain   Yes Historical Provider, MD  pantoprazole (PROTONIX) 40 MG tablet Take 40 mg by mouth 2 (two) times daily. 05/31/15  Yes Historical Provider, MD  PROAIR HFA 108 (90 BASE) MCG/ACT inhaler Inhale 1 puff into the lungs as needed for wheezing.  05/11/14  Yes Historical Provider, MD  QUEtiapine (SEROQUEL) 25 MG tablet Take 50 mg by mouth daily with breakfast.   Yes Historical Provider, MD  rosuvastatin (CRESTOR) 20 MG  tablet Take 20 mg by mouth at bedtime.    Yes Historical Provider, MD  topiramate (TOPAMAX) 25 MG tablet Take 25 mg by mouth at bedtime.   Yes Historical Provider, MD  Umeclidinium-Vilanterol (ANORO ELLIPTA) 62.5-25 MCG/INH AEPB Inhale 1 puff into the lungs daily. 01/27/15  Yes Deneise Lever, MD  predniSONE (DELTASONE) 50 MG tablet Take 1 tablet (50 mg total) by mouth daily. Patient not taking: Reported on 07/15/2015 06/26/15   Dalia Heading, PA-C   Physical Exam: Danley Danker Vitals:   07/15/15 1736 07/15/15 1745 07/15/15 1745 07/15/15 1800  BP: 90/67 99/48 87/45  91/43  Pulse: 104 103 103   Temp:      TempSrc:      Resp: 20 17 19 17   SpO2: 95% 95%       General:  Awake, in nad  Eyes: PERRL B  ENT: membranes moist, dentition fair  Neck: trachea midline, neck supple  Cardiovascular: regular, s1, s2  Respiratory: mildly increased resp effort, end-expiratory wheezing throughout  Abdomen: soft,nondistended  Skin: normal skin turgor, no abnormal skin lesions seen  Musculoskeletal: perfused,no clubbing  Psychiatric: mood/affect normal// no auditory/visual hallucinations  Neurologic: cn2-12 grossly intact, strength/sensation intact  Labs on Admission:  Basic Metabolic Panel:  Recent Labs Lab 07/15/15 1515   NA 141  K 3.4*  CL 106  CO2 24  GLUCOSE 218*  BUN 31*  CREATININE 1.21*  CALCIUM 8.8*   Liver Function Tests:  Recent Labs Lab 07/15/15 1515  AST 26  ALT 23  ALKPHOS 51  BILITOT 0.5  PROT 6.7  ALBUMIN 3.4*   No results for input(s): LIPASE, AMYLASE in the last 168 hours. No results for input(s): AMMONIA in the last 168 hours. CBC:  Recent Labs Lab 07/15/15 1515  WBC 11.9*  NEUTROABS 10.1*  HGB 11.0*  HCT 36.1  MCV 86.4  PLT 166   Cardiac Enzymes: No results for input(s): CKTOTAL, CKMB, CKMBINDEX, TROPONINI in the last 168 hours.  BNP (last 3 results)  Recent Labs  07/15/15 1515  BNP 44.2    ProBNP (last 3 results) No results for input(s): PROBNP in the last 8760 hours.  CBG: No results for input(s): GLUCAP in the last 168 hours.  Radiological Exams on Admission: Dg Chest 2 View  07/15/2015  CLINICAL DATA:  Fever, productive cough, and progressive dyspnea. EXAM: CHEST  2 VIEW COMPARISON:  06/25/2015 and 01/20/2015 FINDINGS: There is slight peribronchial thickening. No discrete infiltrates or effusions. Slight atelectasis at the right lung base. Heart size and vascularity are normal. No acute osseous abnormality. IMPRESSION: Bronchitic changes with slight right base atelectasis anteriorly. Electronically Signed   By: Lorriane Shire M.D.   On: 07/15/2015 15:43    Assessment/Plan Principal Problem:   Sepsis due to pneumonia [facility]) Active Problems:   HTN (hypertension)   HLD (hyperlipidemia)   Asthma exacerbation   Diabetes mellitus type 2 without retinopathy ([place])   1. Likely sepsis with CAP 1. Pt with hypotension, fevers, mild leukocytosis and lactic acidosis 2. CXR largely unremarkable, however pt is having increased productive cough prior to admit 3. Cont on empiric azithromycin and rocephin 4. Cultures pending 5. Will check flu, strep pneumo, legionella serologies, hiv 6. Follow lactate trends 7. Given severity of illness, admit to  stepdown 2. Asthma exacerbation 1. Will cont on scheduled nebs and steroids 2. Cont O2 as needed 3. DM2 1. Will cont on SSI coverage 4. HLD 1. Cont statin per home regimen 5. CAD 1. Stable 2. Cont plavix  and ASA 3. Hold metoprolol secondary to hypotension 6. HTN 1. Pt presenting hypotensive 2. Cont with volume resuscitation 3. Hold BP meds until BP improves 7. DVT prophylaxis 1. Heparin subQ  Code Status: Full Family Communication: Pt in room Disposition Plan: Admit to stepdown   [facility], [facility] Pager [phone]  If 7PM-7AM, please contact night-coverage [email or web address] Password [email or web address] 07/15/2015, 6:30 PM

## 2015-07-15 NOTE — ED Notes (Signed)
Called to give 20 min timer to charge RN.  Room assignment changed to 1225, but room not clean at this time.

## 2015-07-15 NOTE — ED Notes (Signed)
Patient transported to X-ray 

## 2015-07-15 NOTE — Progress Notes (Signed)
PHARMACY NOTE -  ANTIBIOTIC RENAL DOSE ADJUSTMENT   Request received for Pharmacy to assist with antibiotic renal dose adjustment.   Patient has been initiated on ceftriaxone/azithromycin for CAP.  SCr 1.21, estimated CrCl  52ml/min  Plan: Current dosage is appropriate and need for further dosage adjustment appears unlikely at present. Will sign off at this time.  Please reconsult if a change in clinical status warrants re-evaluation of dosage.  Thanks, Doreene Eland, PharmD, BCPS.   Pager: [phone] 07/15/2015 6:28 PM

## 2015-07-15 NOTE — ED Notes (Signed)
Per EMS pt comes from home for weakness, shob.  Pt was up all night last night with her asthma and coughing.  Pt had to have assistance getting her pants back up when she was in the bathroom earlier.  Pt had LBBB on EKG per EMS. Albuterol 5mg , Duoneb and Solu-medrol 125mg .  Pt wasn't able to answer questions to EMS that have gotten worse since MOnday when she saw her PA.

## 2015-07-15 NOTE — ED Provider Notes (Signed)
CSN: [id number]     Arrival date & time 07/15/15  1431 History   First MD Initiated Contact with Patient 07/15/15 1456     Chief Complaint  Patient presents with  . Shortness of Breath  . Weakness     (Consider location/radiation/quality/duration/timing/severity/associated sxs/prior Treatment) HPI  Level V caveat as patient poor historian.   81 year old female who presents with dyspnea. History of asthma, coronary artery disease, type 2 diabetes. She was recently seen in the emergency department on 06/24/2014 for asthma exacerbation. States that she was treated, and took a course of steroids, which improved her symptoms temporarily. Over the course of the past several days she has been having worsening dyspnea, productive cough, and generalized weakness. Denies any chest pain, lower extremity edema, abdominal distention, nausea or vomiting, diarrhea, dysuria or urinary frequency.   Past Medical History  Diagnosis Date  . Asthma   . Coronary artery disease   . Diabetes mellitus without complication ([place])   . Hypothyroidism   . GERD (gastroesophageal reflux disease)   . Insomnia   . Anxiety   . Hypertension   . CAD (coronary artery disease)   . Osteopenia   . Osteoarthritis   . H/O echocardiogram 06-26-2012    EF 55%  . H/O cardiovascular stress test 06-26-2012    compared to previous study there is no significant change; normal myocardial perfusion study. no significant ischemia demonstrated . this is a low risk scan  . History of cardiac cath 09-30-1998    cath done by Dr. Claiborne Billings, successful rotation atherectomy of the left anterior decending artert with reduction from 80 to105  . Hx of cardiac cath 09-26-1998    dign[facility] cath dr. Claiborne Billings to do rotoblator at a later date  . History of Doppler ultrasound 03-30-2008    neg abd. doppler for aneurysm  . History of Holter monitoring 12-22-2007    NSR with pac occs. pvc's   Past Surgical History  Procedure Laterality Date  .  Appendectomy  1947  . Dilation and curettage of uterus  1960  . Vesicovaginal fistula closure w/ tah  1971    Dr Mallie Mussel  . Bladder tack  1970    Dr Rosana Hoes  . Breast lumpectomy  1970    Left, Dr Leandrew Koyanagi  . Nasal sinus surgery  1980    Left; for chronic sinusitis  . Revision total knee arthroplasty  2005    Dr Alvan Dame Right knee   Family History  Problem Relation Age of Onset  . Diabetes Father   . Parkinson's disease Father   . Heart disease Father   . Asthma Father   . Diabetes Mother   . Heart disease Mother   . Heart disease Brother   . Diabetes Brother   . Diabetes Brother   . Diabetes Brother    Social History  Substance Use Topics  . Smoking status: Never Smoker   . Smokeless tobacco: Never Used  . Alcohol Use: No   OB History    No data available     Review of Systems 10/14 systems reviewed and are negative other than those stated in the HPI  Allergies  Review of patient's allergies indicates no known allergies.  Home Medications   Prior to Admission medications   Medication Sig Start Date End Date Taking? Authorizing Provider  acetaminophen (TYLENOL) 500 MG tablet Take 500 mg by mouth every 6 (six) hours as needed for moderate pain or headache.   Yes Historical Provider, MD  acetaminophen-codeine (  TYLENOL #3) 300-30 MG tablet Take 1 tablet by mouth 3 (three) times daily as needed. Pain 07/11/15  Yes Historical Provider, MD  acetaminophen-codeine 120-12 MG/5ML solution Take 10 mLs by mouth every 4 (four) hours as needed (cough). 06/26/15  Yes Christopher Lawyer, PA-C  albuterol (PROVENTIL) (2.5 MG/3ML) 0.083% nebulizer solution Take 3 mLs (2.5 mg total) by nebulization every 6 (six) hours as needed for wheezing. 05/02/15  Yes Deneise Lever, MD  aspirin EC 81 MG tablet Take 81 mg by mouth at bedtime.    Yes Historical Provider, MD  azelastine (ASTELIN) 137 MCG/SPRAY nasal spray Place 1 spray into the nose 2 (two) times daily. Use in each nostril as directed   Yes  Historical Provider, MD  benzonatate (TESSALON) 200 MG capsule Take 1 capsule (200 mg total) by mouth 3 (three) times daily as needed for cough. 05/02/15  Yes Deneise Lever, MD  clopidogrel (PLAVIX) 75 MG tablet Take 75 mg by mouth daily.   Yes Historical Provider, MD  clorazepate (TRANXENE) 15 MG tablet Take 15 mg by mouth at bedtime. anxiety   Yes Historical Provider, MD  ezetimibe (ZETIA) 10 MG tablet Take 10 mg by mouth at bedtime.   Yes Historical Provider, MD  glimepiride (AMARYL) 1 MG tablet Take 1 mg by mouth daily before breakfast.   Yes Historical Provider, MD  Guaifenesin 1200 MG TB12 Take 1 tablet (1,200 mg total) by mouth 2 (two) times daily. 06/26/15  Yes Christopher Lawyer, PA-C  ibuprofen (ADVIL,MOTRIN) 200 MG tablet Take 400 mg by mouth every 6 (six) hours as needed for headache or moderate pain.   Yes Historical Provider, MD  levothyroxine (SYNTHROID, LEVOTHROID) 50 MCG tablet Take 25 mcg by mouth daily.    Yes Historical Provider, MD  LORazepam (ATIVAN) 1 MG tablet Take 1 mg by mouth every 8 (eight) hours as needed for anxiety.   Yes Historical Provider, MD  metoprolol succinate (TOPROL-XL) 50 MG 24 hr tablet TAKE 2 TABLETS BY MOUTH ONCE DAILY WITH OR IMMEDIATELY FOLLOWING A MEAL 08/05/14  Yes Troy Sine, MD  MICARDIS HCT 80-25 MG per tablet Take 1 tablet by mouth daily. 08/01/12  Yes Historical Provider, MD  montelukast (SINGULAIR) 10 MG tablet Take 10 mg by mouth at bedtime.   Yes Historical Provider, MD  Multiple Vitamin (MULTIVITAMIN WITH MINERALS) TABS tablet Take 1 tablet by mouth daily.   Yes Historical Provider, MD  nitroGLYCERIN (NITROSTAT) 0.4 MG SL tablet Place 0.4 mg under the tongue every 5 (five) minutes as needed. For chest pain   Yes Historical Provider, MD  pantoprazole (PROTONIX) 40 MG tablet Take 40 mg by mouth 2 (two) times daily. 05/31/15  Yes Historical Provider, MD  PROAIR HFA 108 (90 BASE) MCG/ACT inhaler Inhale 1 puff into the lungs as needed for  wheezing.  05/11/14  Yes Historical Provider, MD  QUEtiapine (SEROQUEL) 25 MG tablet Take 50 mg by mouth daily with breakfast.   Yes Historical Provider, MD  rosuvastatin (CRESTOR) 20 MG tablet Take 20 mg by mouth at bedtime.    Yes Historical Provider, MD  topiramate (TOPAMAX) 25 MG tablet Take 25 mg by mouth at bedtime.   Yes Historical Provider, MD  Umeclidinium-Vilanterol (ANORO ELLIPTA) 62.5-25 MCG/INH AEPB Inhale 1 puff into the lungs daily. 01/27/15  Yes Deneise Lever, MD  predniSONE (DELTASONE) 50 MG tablet Take 1 tablet (50 mg total) by mouth daily. Patient not taking: Reported on 07/15/2015 06/26/15   Dalia Heading, PA-C  BP 87/45 mmHg  Pulse 103  Temp(Src) 102.5 F (39.2 C) (Rectal)  Resp 19  SpO2 95% Physical Exam Physical Exam  Nursing note and vitals reviewed. Constitutional: Elderly woman, mild respiratory distress Head: Normocephalic and atraumatic.  Mouth/Throat: Oropharynx is clear. Mucous membranes are dry.  Neck: Normal range of motion. Neck supple.  Cardiovascular: Tachycardic rate and regular rhythm.  Trace pedal edema. Pulmonary/Chest: Tachypnea. No conversational dyspnea. Coarse breath sounds throughout with expiratory wheezing in all lung fields.  Abdominal: Soft. Obese. There is no tenderness. There is no rebound and no guarding.  Musculoskeletal: Normal range of motion.  Neurological: Alert, no facial droop, fluent speech, moves all extremities symmetrically Skin: Skin is warm and dry.  Psychiatric: Cooperative  ED Course  Procedures (including critical care time) Labs Review Labs Reviewed  COMPREHENSIVE METABOLIC PANEL - Abnormal; Notable for the following:    Potassium 3.4 (*)    Glucose, Bld 218 (*)    BUN 31 (*)    Creatinine, Ser 1.21 (*)    Calcium 8.8 (*)    Albumin 3.4 (*)    GFR calc non Af Amer 39 (*)    GFR calc Af Amer 45 (*)    All other components within normal limits  CBC WITH DIFFERENTIAL/PLATELET - Abnormal; Notable for the  following:    WBC 11.9 (*)    Hemoglobin 11.0 (*)    Neutro Abs 10.1 (*)    All other components within normal limits  I-STAT CG4 LACTIC ACID, ED - Abnormal; Notable for the following:    Lactic Acid, Venous 3.50 (*)    All other components within normal limits  CULTURE, BLOOD (ROUTINE X 2)  CULTURE, BLOOD (ROUTINE X 2)  URINE CULTURE  URINALYSIS, ROUTINE W REFLEX MICROSCOPIC (NOT AT [facility])  BRAIN NATRIURETIC PEPTIDE  I-STAT TROPOININ, ED  I-STAT CG4 LACTIC ACID, ED    Imaging Review Dg Chest 2 View  07/15/2015  CLINICAL DATA:  Fever, productive cough, and progressive dyspnea. EXAM: CHEST  2 VIEW COMPARISON:  06/25/2015 and 01/20/2015 FINDINGS: There is slight peribronchial thickening. No discrete infiltrates or effusions. Slight atelectasis at the right lung base. Heart size and vascularity are normal. No acute osseous abnormality. IMPRESSION: Bronchitic changes with slight right base atelectasis anteriorly. Electronically Signed   By: Lorriane Shire M.D.   On: 07/15/2015 15:43   I have personally reviewed and evaluated these images and lab results as part of my medical decision-making.   EKG Interpretation   Date/Time:  Friday July 15 2015 14:43:11 EST Ventricular Rate:  114 PR Interval:  135 QRS Duration: 152 QT Interval:  373 QTC Calculation: 514 R Axis:   10 Text Interpretation:  Sinus tachycardia Left bundle branch block since  last tracing no significant change Confirmed by BELFI  MD, MELANIE ([id number])  on 07/15/2015 2:55:02 PM      CRITICAL CARE Performed by: Forde Dandy   Total critical care time: 35 minutes  Critical care time was exclusive of separately billable procedures and treating other patients.  Critical care was necessary to treat or prevent imminent or life-threatening deterioration.  Critical care was time spent personally by me on the following activities: development of treatment plan with patient and/or surrogate as well as nursing,  discussions with consultants, evaluation of patient's response to treatment, examination of patient, obtaining history from patient or surrogate, ordering and performing treatments and interventions, ordering and review of laboratory studies, ordering and review of radiographic studies, pulse oximetry and re-evaluation of  patient's condition.  MDM   Final diagnoses:  Sepsis, due to unspecified organism ([place])  CAP (community acquired pneumonia)  Lobar pneumonia ([place])  Acute asthma exacerbation, unspecified asthma severity  AKI (acute kidney injury) ([place])  Lactic acidosis    81 year old with history of asthma, diabetes, and CAD who presents with dyspnea. Concern for sepsis that she is initially febrile to 102 Fahrenheit, tachycardic with a heart rate in the 110s to 120s, but normotensive. Has tachypnea, with coarse breath sounds and diffuse inspiratory and expiratory wheezing. Appears dry on exam. He did receive 125 mg of Solu-Medrol and a breathing treatment by EMS prior to arrival. Given additional 2 breathing treatments, and is noted to have improved work of breathing, improved air movement, but continues to have a 2 L oxygen requirement. Blood work concerning for leukocytosis of 11 and lactic acidosis of 3.5. Also notable mild AKI. Urinalysis is not suggestive of infection. Blood cultures are obtained and pending. Had received 500 mL of fluid by EMS, and given an additional liter of fluids here. She was intermittently noted to have lower blood pressures in the 0000000 systolic. Is on an additional liter of fluids here, with plans to repeat lactic acid after her second liter here. I had received ceftriaxone and azithromycin for presumed community-acquired pneumonia. Her chest x-ray shows blurring of the right heart border that is concerning for lobar pneumonia. Discussed with [facility] to admit to stepdown for ongoing management.    Forde Dandy, MD 07/15/15 [phone]

## 2015-07-15 NOTE — ED Notes (Signed)
Bed: RESB Expected date:  Expected time:  Means of arrival:  Comments: EMS SHOB 

## 2015-07-16 LAB — COMPREHENSIVE METABOLIC PANEL
ALBUMIN: 3.5 g/dL (ref 3.5–5.0)
ALK PHOS: 50 U/L (ref 38–126)
ALT: 27 U/L (ref 14–54)
AST: 42 U/L — AB (ref 15–41)
Anion gap: 10 (ref 5–15)
BILIRUBIN TOTAL: 0.3 mg/dL (ref 0.3–1.2)
BUN: 28 mg/dL — AB (ref 6–20)
CALCIUM: 8.1 mg/dL — AB (ref 8.9–10.3)
CO2: 21 mmol/L — ABNORMAL LOW (ref 22–32)
Chloride: 105 mmol/L (ref 101–111)
Creatinine, Ser: 1.08 mg/dL — ABNORMAL HIGH (ref 0.44–1.00)
GFR calc Af Amer: 52 mL/min — ABNORMAL LOW (ref >60)
GFR, EST NON AFRICAN AMERICAN: 44 mL/min — AB (ref >60)
Glucose, Bld: 284 mg/dL — ABNORMAL HIGH (ref 65–99)
POTASSIUM: 3 mmol/L — AB (ref 3.5–5.1)
SODIUM: 136 mmol/L (ref 135–145)
TOTAL PROTEIN: 6.9 g/dL (ref 6.5–8.1)

## 2015-07-16 LAB — MRSA PCR SCREENING: MRSA by PCR: NEGATIVE

## 2015-07-16 LAB — STREP PNEUMONIAE URINARY ANTIGEN: Strep Pneumo Urinary Antigen: NEGATIVE

## 2015-07-16 LAB — GLUCOSE, CAPILLARY
GLUCOSE-CAPILLARY: 222 mg/dL — AB (ref 65–99)
GLUCOSE-CAPILLARY: 246 mg/dL — AB (ref 65–99)
Glucose-Capillary: 247 mg/dL — ABNORMAL HIGH (ref 65–99)
Glucose-Capillary: 232 mg/dL — ABNORMAL HIGH (ref 65–99)

## 2015-07-16 LAB — INFLUENZA PANEL BY PCR (TYPE A & B)
H1N1FLUPCR: NOT DETECTED
Influenza A By PCR: NEGATIVE
Influenza B By PCR: NEGATIVE

## 2015-07-16 LAB — CBC
HEMATOCRIT: 35.1 % — AB (ref 36.0–46.0)
Hemoglobin: 11.2 g/dL — ABNORMAL LOW (ref 12.0–15.0)
MCH: 27.1 pg (ref 26.0–34.0)
MCHC: 31.9 g/dL (ref 30.0–36.0)
MCV: 85 fL (ref 78.0–100.0)
Platelets: 150 10*3/uL (ref 150–400)
RBC: 4.13 MIL/uL (ref 3.87–5.11)
RDW: 13.8 % (ref 11.5–15.5)
WBC: 7.6 10*3/uL (ref 4.0–10.5)

## 2015-07-16 LAB — LACTIC ACID, PLASMA
LACTIC ACID, VENOUS: 2 mmol/L (ref 0.5–2.0)
Lactic Acid, Venous: 2.2 mmol/L (ref 0.5–2.0)

## 2015-07-16 LAB — HIV ANTIBODY (ROUTINE TESTING W REFLEX): HIV Screen 4th Generation wRfx: NONREACTIVE

## 2015-07-16 MED ORDER — SODIUM CHLORIDE 0.9 % IV BOLUS (SEPSIS)
500.0000 mL | Freq: Once | INTRAVENOUS | Status: AC
  Administered 2015-07-16: 500 mL via INTRAVENOUS

## 2015-07-16 MED ORDER — POTASSIUM CHLORIDE CRYS ER 20 MEQ PO TBCR
40.0000 meq | EXTENDED_RELEASE_TABLET | Freq: Two times a day (BID) | ORAL | Status: AC
  Administered 2015-07-16 (×2): 40 meq via ORAL
  Filled 2015-07-16 (×2): qty 2

## 2015-07-16 MED ORDER — CETYLPYRIDINIUM CHLORIDE 0.05 % MT LIQD
7.0000 mL | Freq: Two times a day (BID) | OROMUCOSAL | Status: DC
  Administered 2015-07-16 – 2015-07-19 (×6): 7 mL via OROMUCOSAL

## 2015-07-16 MED ORDER — IPRATROPIUM-ALBUTEROL 0.5-2.5 (3) MG/3ML IN SOLN
3.0000 mL | Freq: Three times a day (TID) | RESPIRATORY_TRACT | Status: DC
  Administered 2015-07-17 – 2015-07-19 (×8): 3 mL via RESPIRATORY_TRACT
  Filled 2015-07-16 (×8): qty 3

## 2015-07-16 NOTE — Progress Notes (Signed)
[facility] PROGRESS NOTE  Annette RUPEL [id number] DOB: Feb 19, 1925 DOA: 07/15/2015 PCP: [name], MD  HPI/Brief narrative 81 y.o. female with a hx of asthma, DM2, CAD, HTN, who presents to the ED with worsening productive cough, wheezing and sob. Pt was seen in the ED 2 weeks prior to admit and was discharged for asthma exacerbation. Patient presented febrile with hypotension and with lactic acidosis. The patient was admitted for further work up.  Assessment/Plan:  1. Likely sepsis with CAP 1. Pt with hypotension, fevers, mild leukocytosis and lactic acidosis 2. CXR largely unremarkable, however pt is having increased productive cough prior to admit 3. Cont on empiric azithromycin and rocephin 4. flu neg, strep pneumo neg, hiv neg 5. Legionella pending 6. Lactate improving 7. On min O2 support 2. Asthma exacerbation 1. Will cont on scheduled nebs and steroids 2. Cont O2 as needed 3. DM2 1. Will cont on SSI coverage 4. HLD 1. Cont statin per home regimen 5. CAD 1. Stable 2. Cont plavix and ASA 3. Hold metoprolol secondary to hypotension 6. HTN 1. Pt presenting hypotensive 2. Cont with volume resuscitation 3. Holding BP meds until BP further improves 7. DVT prophylaxis 1. Heparin subQ  Code Status: Full Family Communication: Pt in room Disposition Plan: Home when on RA and PO meds   Consultants:    Procedures:    Antibiotics: Anti-infectives    Start     Dose/Rate Route Frequency Ordered Stop   07/15/15 1830  cefTRIAXone (ROCEPHIN) 1 g in dextrose 5 % 50 mL IVPB     1 g 100 mL/hr over 30 Minutes Intravenous Every 24 hours 07/15/15 1824 07/22/15 1559   07/15/15 1830  azithromycin (ZITHROMAX) 500 mg in dextrose 5 % 250 mL IVPB     500 mg 250 mL/hr over 60 Minutes Intravenous Every 24 hours 07/15/15 1824 07/22/15 1559   07/15/15 1515  cefTRIAXone (ROCEPHIN) 1 g in dextrose 5 % 50 mL IVPB     1 g 100 mL/hr over 30 Minutes Intravenous  Once 07/15/15  1512 07/15/15 1639   07/15/15 1515  azithromycin (ZITHROMAX) 500 mg in dextrose 5 % 250 mL IVPB     500 mg 250 mL/hr over 60 Minutes Intravenous  Once 07/15/15 1512 07/15/15 1739      HPI/Subjective: Feels better today  Objective: Filed Vitals:   07/16/15 0838 07/16/15 1000 07/16/15 1200 07/16/15 1345  BP: 126/52 106/48 97/45   Pulse: 80 95 91   Temp:      TempSrc:      Resp: 17 16 23    Height:      Weight:      SpO2: 99% 95% 93% 95%    Intake/Output Summary (Last 24 hours) at 07/16/15 1615 Last data filed at 07/16/15 1406  Gross per 24 hour  Intake 2131.67 ml  Output   1135 ml  Net 996.67 ml   Filed Weights   07/15/15 1951 07/16/15 0400  Weight: 89 kg (196 lb 3.4 oz) 91.8 kg (202 lb 6.1 oz)    Exam:   General:  Awake, in nad  Cardiovascular: regular, s1, s2  Respiratory: normal resp effort, no wheezing  Abdomen: soft,nondistended  Musculoskeletal: perfused, no clubbing   Data Reviewed: Basic Metabolic Panel:  Recent Labs Lab 07/15/15 1515 07/15/15 1846 07/16/15 0205  NA 141  --  136  K 3.4*  --  3.0*  CL 106  --  105  CO2 24  --  21*  GLUCOSE 218*  --  284*  BUN 31*  --  28*  CREATININE 1.21* 1.22* 1.08*  CALCIUM 8.8*  --  8.1*   Liver Function Tests:  Recent Labs Lab 07/15/15 1515 07/16/15 0205  AST 26 42*  ALT 23 27  ALKPHOS 51 50  BILITOT 0.5 0.3  PROT 6.7 6.9  ALBUMIN 3.4* 3.5   No results for input(s): LIPASE, AMYLASE in the last 168 hours. No results for input(s): AMMONIA in the last 168 hours. CBC:  Recent Labs Lab 07/15/15 1515 07/15/15 1846 07/16/15 0205  WBC 11.9* 11.5* 7.6  NEUTROABS 10.1*  --   --   HGB 11.0* 10.1* 11.2*  HCT 36.1 31.6* 35.1*  MCV 86.4 85.6 85.0  PLT 166 157 150   Cardiac Enzymes: No results for input(s): CKTOTAL, CKMB, CKMBINDEX, TROPONINI in the last 168 hours. BNP (last 3 results)  Recent Labs  07/15/15 1515  BNP 44.2    ProBNP (last 3 results) No results for input(s): PROBNP in  the last 8760 hours.  CBG:  Recent Labs Lab 07/15/15 2214 07/16/15 0752 07/16/15 1151  GLUCAP 359* 222* 247*    Recent Results (from the past 240 hour(s))  Blood Culture (routine x 2)     Status: None (Preliminary result)   Collection Time: 07/15/15  3:11 PM  Result Value Ref Range Status   Specimen Description BLOOD LEFT WRIST  Final   Special Requests BOTTLES DRAWN AEROBIC AND ANAEROBIC 5ML  Final   Culture   Final    NO GROWTH < 24 HOURS Performed at [facility]    Report Status PENDING  Incomplete  Blood Culture (routine x 2)     Status: None (Preliminary result)   Collection Time: 07/15/15  3:15 PM  Result Value Ref Range Status   Specimen Description BLOOD RIGHT HAND  Final   Special Requests BOTTLES DRAWN AEROBIC AND ANAEROBIC 5ML  Final   Culture   Final    NO GROWTH < 24 HOURS Performed at [facility]    Report Status PENDING  Incomplete  Urine culture     Status: None (Preliminary result)   Collection Time: 07/15/15  4:56 PM  Result Value Ref Range Status   Specimen Description URINE, CATHETERIZED  Final   Special Requests NONE  Final   Culture   Final    NO GROWTH < 24 HOURS Performed at [facility]    Report Status PENDING  Incomplete  MRSA PCR Screening     Status: None   Collection Time: 07/15/15 10:48 PM  Result Value Ref Range Status   MRSA by PCR NEGATIVE NEGATIVE Final    Comment:        The GeneXpert MRSA Assay (FDA approved for NASAL specimens only), is one component of a comprehensive MRSA colonization surveillance program. It is not intended to diagnose MRSA infection nor to guide or monitor treatment for MRSA infections.      Studies: Dg Chest 2 View  07/15/2015  CLINICAL DATA:  Fever, productive cough, and progressive dyspnea. EXAM: CHEST  2 VIEW COMPARISON:  06/25/2015 and 01/20/2015 FINDINGS: There is slight peribronchial thickening. No discrete infiltrates or effusions. Slight atelectasis at the right  lung base. Heart size and vascularity are normal. No acute osseous abnormality. IMPRESSION: Bronchitic changes with slight right base atelectasis anteriorly. Electronically Signed   By: Lorriane Shire M.D.   On: 07/15/2015 15:43    Scheduled Meds: . antiseptic oral rinse  7 mL Mouth Rinse BID  . aspirin  EC  81 mg Oral QHS  . azithromycin  500 mg Intravenous Q24H  . cefTRIAXone (ROCEPHIN)  IV  1 g Intravenous Q24H  . clopidogrel  75 mg Oral Daily  . clorazepate  15 mg Oral QHS  . ezetimibe  10 mg Oral QHS  . guaiFENesin  1,200 mg Oral BID  . heparin  5,000 Units Subcutaneous 3 times per day  . irbesartan  300 mg Oral Daily   And  . hydrochlorothiazide  25 mg Oral Daily  . insulin aspart  0-15 Units Subcutaneous TID WC  . insulin aspart  0-5 Units Subcutaneous QHS  . ipratropium-albuterol  3 mL Nebulization Q6H  . levothyroxine  25 mcg Oral QAC breakfast  . methylPREDNISolone (SOLU-MEDROL) injection  60 mg Intravenous Q12H  . pantoprazole  40 mg Oral BID  . potassium chloride  40 mEq Oral BID  . QUEtiapine  50 mg Oral Q breakfast  . rosuvastatin  20 mg Oral QHS  . sodium chloride  3 mL Intravenous Q12H  . topiramate  25 mg Oral QHS   Continuous Infusions: . sodium chloride 100 mL/hr at 07/15/15 2253    Principal Problem:   Sepsis due to pneumonia [facility]) Active Problems:   HTN (hypertension)   HLD (hyperlipidemia)   Asthma exacerbation   Diabetes mellitus type 2 without retinopathy ([place])   [facility], [facility] Pager [phone]. If 7PM-7AM, please contact night-coverage at [email or web address], password [facility] 07/16/2015, 4:15 PM  LOS: 1 day

## 2015-07-16 NOTE — Progress Notes (Signed)
CRITICAL VALUE ALERT  Critical value received:  Lactic acid 2.2  Date of notification:  07/16/2015  Time of notification:  0000  Critical value read back:Yes.    Nurse who received alert:  Reche Dixon  MD notified (1st page):  Fredirick Maudlin  Time of first page:  0002  MD notified (2nd page):  Time of second page:  Responding MD:  Fredirick Maudlin  Time MD responded: [phone]

## 2015-07-17 LAB — GLUCOSE, CAPILLARY
Glucose-Capillary: 188 mg/dL — ABNORMAL HIGH (ref 65–99)
Glucose-Capillary: 208 mg/dL — ABNORMAL HIGH (ref 65–99)
Glucose-Capillary: 195 mg/dL — ABNORMAL HIGH (ref 65–99)
Glucose-Capillary: 224 mg/dL — ABNORMAL HIGH (ref 65–99)

## 2015-07-17 LAB — URINE CULTURE: CULTURE: NO GROWTH

## 2015-07-17 LAB — BASIC METABOLIC PANEL
ANION GAP: 9 (ref 5–15)
BUN: 28 mg/dL — AB (ref 6–20)
CALCIUM: 8 mg/dL — AB (ref 8.9–10.3)
CO2: 21 mmol/L — ABNORMAL LOW (ref 22–32)
CREATININE: 0.9 mg/dL (ref 0.44–1.00)
Chloride: 108 mmol/L (ref 101–111)
GFR calc Af Amer: >60 mL/min (ref >60)
GFR, EST NON AFRICAN AMERICAN: 55 mL/min — AB (ref >60)
GLUCOSE: 209 mg/dL — AB (ref 65–99)
Potassium: 3.6 mmol/L (ref 3.5–5.1)
Sodium: 138 mmol/L (ref 135–145)

## 2015-07-17 LAB — MAGNESIUM: Magnesium: 2.2 mg/dL (ref 1.7–2.4)

## 2015-07-17 MED ORDER — MAGNESIUM CITRATE PO SOLN
1.0000 | Freq: Once | ORAL | Status: AC
  Administered 2015-07-17: 1 via ORAL

## 2015-07-17 NOTE — Progress Notes (Signed)
[facility] PROGRESS NOTE  [name] [id number] DOB: 1925-06-29 DOA: 07/15/2015 PCP: [name], MD  HPI/Brief narrative 81 y.o. female with a hx of asthma, DM2, CAD, HTN, who presents to the ED with worsening productive cough, wheezing and sob. Pt was seen in the ED 2 weeks prior to admit and was discharged for asthma exacerbation. Patient presented febrile with hypotension and with lactic acidosis. The patient was admitted for further work up.  Assessment/Plan:  1. Likely CAP with sepsis present on admission 1. Pt with hypotension, fevers, mild leukocytosis and lactic acidosis 2. CXR largely unremarkable, however pt is having increased productive cough prior to admit 3. Patient is continued on empiric azithromycin and rocephin 4. flu neg, strep pneumo neg, hiv neg 5. Legionella pending 6. Lactate improving 7. On min O2 support 2. Asthma exacerbation 1. Will cont on scheduled nebs and steroids 2. Cont O2 as needed 3. Will begin weaning solumedrol 3. DM2 1. Will cont on SSI coverage 4. HLD 1. Cont statin per home regimen 5. CAD 1. Stable 2. Cont plavix and ASA 3. Hold metoprolol secondary to hypotension 6. HTN 1. Pt presenting hypotensive 2. BP now improved with IVF 3. Holding BP meds until BP further improves 7. DVT prophylaxis 1. Heparin subQ 8. Constipation 1. No BM in past several days 2. Will give trial of mg citrate  Code Status: Full Family Communication: Pt in room, pt's son at bedside Disposition Plan: Home when on RA and PO meds   Consultants:    Procedures:    Antibiotics: Anti-infectives    Start     Dose/Rate Route Frequency Ordered Stop   07/15/15 1830  cefTRIAXone (ROCEPHIN) 1 g in dextrose 5 % 50 mL IVPB     1 g 100 mL/hr over 30 Minutes Intravenous Every 24 hours 07/15/15 1824 07/22/15 1559   07/15/15 1830  azithromycin (ZITHROMAX) 500 mg in dextrose 5 % 250 mL IVPB     500 mg 250 mL/hr over 60 Minutes Intravenous Every 24  hours 07/15/15 1824 07/22/15 1559   07/15/15 1515  cefTRIAXone (ROCEPHIN) 1 g in dextrose 5 % 50 mL IVPB     1 g 100 mL/hr over 30 Minutes Intravenous  Once 07/15/15 1512 07/15/15 1639   07/15/15 1515  azithromycin (ZITHROMAX) 500 mg in dextrose 5 % 250 mL IVPB     500 mg 250 mL/hr over 60 Minutes Intravenous  Once 07/15/15 1512 07/15/15 1739      HPI/Subjective: Reports feeling constipated  Objective: Filed Vitals:   07/16/15 2014 07/17/15 0531 07/17/15 0820 07/17/15 1246  BP: 100/48 130/68    Pulse: 96 90    Temp: 98.3 F (36.8 C) 98 F (36.7 C)    TempSrc: Oral Oral    Resp: 18 16    Height:      Weight:      SpO2: 97% 97% 96% 98%    Intake/Output Summary (Last 24 hours) at 07/17/15 1404 Last data filed at 07/17/15 0631  Gross per 24 hour  Intake 2511.67 ml  Output   1725 ml  Net 786.67 ml   Filed Weights   07/15/15 1951 07/16/15 0400  Weight: 89 kg (196 lb 3.4 oz) 91.8 kg (202 lb 6.1 oz)    Exam:   General:  Awake, laying in bed, in nad  Cardiovascular: regular, s1, s2  Respiratory: normal resp effort, no wheezing  Abdomen: soft,distended, pos bs  Musculoskeletal: perfused, no clubbing   Data Reviewed: Basic Metabolic Panel:  Recent Labs Lab 07/15/15 1515 07/15/15 1846 07/16/15 0205 07/17/15 0555  NA 141  --  136 138  K 3.4*  --  3.0* 3.6  CL 106  --  105 108  CO2 24  --  21* 21*  GLUCOSE 218*  --  284* 209*  BUN 31*  --  28* 28*  CREATININE 1.21* 1.22* 1.08* 0.90  CALCIUM 8.8*  --  8.1* 8.0*  MG  --   --   --  2.2   Liver Function Tests:  Recent Labs Lab 07/15/15 1515 07/16/15 0205  AST 26 42*  ALT 23 27  ALKPHOS 51 50  BILITOT 0.5 0.3  PROT 6.7 6.9  ALBUMIN 3.4* 3.5   No results for input(s): LIPASE, AMYLASE in the last 168 hours. No results for input(s): AMMONIA in the last 168 hours. CBC:  Recent Labs Lab 07/15/15 1515 07/15/15 1846 07/16/15 0205  WBC 11.9* 11.5* 7.6  NEUTROABS 10.1*  --   --   HGB 11.0* 10.1*  11.2*  HCT 36.1 31.6* 35.1*  MCV 86.4 85.6 85.0  PLT 166 157 150   Cardiac Enzymes: No results for input(s): CKTOTAL, CKMB, CKMBINDEX, TROPONINI in the last 168 hours. BNP (last 3 results)  Recent Labs  07/15/15 1515  BNP 44.2    ProBNP (last 3 results) No results for input(s): PROBNP in the last 8760 hours.  CBG:  Recent Labs Lab 07/16/15 1151 07/16/15 1626 07/16/15 2302 07/17/15 0746 07/17/15 1150  GLUCAP 247* 246* 232* 188* 208*    Recent Results (from the past 240 hour(s))  Blood Culture (routine x 2)     Status: None (Preliminary result)   Collection Time: 07/15/15  3:11 PM  Result Value Ref Range Status   Specimen Description BLOOD LEFT WRIST  Final   Special Requests BOTTLES DRAWN AEROBIC AND ANAEROBIC 5ML  Final   Culture   Final    NO GROWTH 2 DAYS Performed at [facility]    Report Status PENDING  Incomplete  Blood Culture (routine x 2)     Status: None (Preliminary result)   Collection Time: 07/15/15  3:15 PM  Result Value Ref Range Status   Specimen Description BLOOD RIGHT HAND  Final   Special Requests BOTTLES DRAWN AEROBIC AND ANAEROBIC 5ML  Final   Culture   Final    NO GROWTH 2 DAYS Performed at [facility]    Report Status PENDING  Incomplete  Urine culture     Status: None   Collection Time: 07/15/15  4:56 PM  Result Value Ref Range Status   Specimen Description URINE, CATHETERIZED  Final   Special Requests NONE  Final   Culture   Final    NO GROWTH 2 DAYS Performed at [facility]    Report Status 07/17/2015 FINAL  Final  MRSA PCR Screening     Status: None   Collection Time: 07/15/15 10:48 PM  Result Value Ref Range Status   MRSA by PCR NEGATIVE NEGATIVE Final    Comment:        The GeneXpert MRSA Assay (FDA approved for NASAL specimens only), is one component of a comprehensive MRSA colonization surveillance program. It is not intended to diagnose MRSA infection nor to guide or monitor treatment  for MRSA infections.      Studies: Dg Chest 2 View  07/15/2015  CLINICAL DATA:  Fever, productive cough, and progressive dyspnea. EXAM: CHEST  2 VIEW COMPARISON:  06/25/2015 and 01/20/2015 FINDINGS: There  is slight peribronchial thickening. No discrete infiltrates or effusions. Slight atelectasis at the right lung base. Heart size and vascularity are normal. No acute osseous abnormality. IMPRESSION: Bronchitic changes with slight right base atelectasis anteriorly. Electronically Signed   By: Lorriane Shire M.D.   On: 07/15/2015 15:43    Scheduled Meds: . antiseptic oral rinse  7 mL Mouth Rinse BID  . aspirin EC  81 mg Oral QHS  . azithromycin  500 mg Intravenous Q24H  . cefTRIAXone (ROCEPHIN)  IV  1 g Intravenous Q24H  . clopidogrel  75 mg Oral Daily  . clorazepate  15 mg Oral QHS  . ezetimibe  10 mg Oral QHS  . guaiFENesin  1,200 mg Oral BID  . heparin  5,000 Units Subcutaneous 3 times per day  . irbesartan  300 mg Oral Daily   And  . hydrochlorothiazide  25 mg Oral Daily  . insulin aspart  0-15 Units Subcutaneous TID WC  . insulin aspart  0-5 Units Subcutaneous QHS  . ipratropium-albuterol  3 mL Nebulization TID  . levothyroxine  25 mcg Oral QAC breakfast  . methylPREDNISolone (SOLU-MEDROL) injection  60 mg Intravenous Q12H  . pantoprazole  40 mg Oral BID  . QUEtiapine  50 mg Oral Q breakfast  . rosuvastatin  20 mg Oral QHS  . sodium chloride  3 mL Intravenous Q12H  . topiramate  25 mg Oral QHS   Continuous Infusions: . sodium chloride 100 mL/hr at 07/17/15 0601    Principal Problem:   Sepsis due to pneumonia [facility]) Active Problems:   HTN (hypertension)   HLD (hyperlipidemia)   Asthma exacerbation   Diabetes mellitus type 2 without retinopathy ([place])   [facility], [place] [facility] Pager [phone]. If 7PM-7AM, please contact night-coverage at [email or web address], password [facility] 07/17/2015, 2:04 PM  LOS: 2 days

## 2015-07-17 NOTE — Progress Notes (Signed)
Utilization review completed.  

## 2015-07-18 ENCOUNTER — Telehealth: Payer: Self-pay | Admitting: Cardiovascular Disease

## 2015-07-18 LAB — BASIC METABOLIC PANEL
Anion gap: 7 (ref 5–15)
BUN: 28 mg/dL — AB (ref 6–20)
CALCIUM: 8.3 mg/dL — AB (ref 8.9–10.3)
CO2: 24 mmol/L (ref 22–32)
Chloride: 112 mmol/L — ABNORMAL HIGH (ref 101–111)
Creatinine, Ser: 0.83 mg/dL (ref 0.44–1.00)
GFR calc Af Amer: >60 mL/min (ref >60)
GLUCOSE: 233 mg/dL — AB (ref 65–99)
Potassium: 4 mmol/L (ref 3.5–5.1)
Sodium: 143 mmol/L (ref 135–145)

## 2015-07-18 LAB — GLUCOSE, CAPILLARY
GLUCOSE-CAPILLARY: 195 mg/dL — AB (ref 65–99)
GLUCOSE-CAPILLARY: 169 mg/dL — AB (ref 65–99)
GLUCOSE-CAPILLARY: 202 mg/dL — AB (ref 65–99)
GLUCOSE-CAPILLARY: 189 mg/dL — AB (ref 65–99)

## 2015-07-18 LAB — CBC
HEMATOCRIT: 29.4 % — AB (ref 36.0–46.0)
Hemoglobin: 8.9 g/dL — ABNORMAL LOW (ref 12.0–15.0)
MCH: 25.6 pg — AB (ref 26.0–34.0)
MCHC: 30.3 g/dL (ref 30.0–36.0)
MCV: 84.7 fL (ref 78.0–100.0)
Platelets: 180 10*3/uL (ref 150–400)
RBC: 3.47 MIL/uL — ABNORMAL LOW (ref 3.87–5.11)
RDW: 14.2 % (ref 11.5–15.5)
WBC: 7.4 10*3/uL (ref 4.0–10.5)

## 2015-07-18 LAB — LEGIONELLA PNEUMOPHILA SEROGP 1 UR AG: L. PNEUMOPHILA SEROGP 1 UR AG: NEGATIVE

## 2015-07-18 LAB — HEMOGLOBIN AND HEMATOCRIT, BLOOD
HCT: 31.6 % — ABNORMAL LOW (ref 36.0–46.0)
HEMOGLOBIN: 9.9 g/dL — AB (ref 12.0–15.0)

## 2015-07-18 MED ORDER — CEFUROXIME AXETIL 250 MG PO TABS
250.0000 mg | ORAL_TABLET | Freq: Two times a day (BID) | ORAL | Status: DC
  Administered 2015-07-18 – 2015-07-19 (×3): 250 mg via ORAL
  Filled 2015-07-18 (×4): qty 1

## 2015-07-18 MED ORDER — METHYLPREDNISOLONE SODIUM SUCC 125 MG IJ SOLR
60.0000 mg | INTRAMUSCULAR | Status: DC
  Filled 2015-07-18: qty 0.96

## 2015-07-18 MED ORDER — AZITHROMYCIN 500 MG PO TABS
500.0000 mg | ORAL_TABLET | Freq: Every day | ORAL | Status: DC
  Administered 2015-07-18 – 2015-07-19 (×2): 500 mg via ORAL
  Filled 2015-07-18 (×2): qty 1

## 2015-07-18 MED ORDER — METHYLPREDNISOLONE SODIUM SUCC 40 MG IJ SOLR
40.0000 mg | INTRAMUSCULAR | Status: DC
  Administered 2015-07-18 – 2015-07-19 (×2): 40 mg via INTRAVENOUS
  Filled 2015-07-18 (×2): qty 1

## 2015-07-18 NOTE — Evaluation (Signed)
Physical Therapy Evaluation Patient Details Name: [name] MRN: [id number] DOB: [birth year] Today's Date: 07/18/2015   History of Present Illness  81 yo female admitted with hypotension, fever, pna, sepsis. Hx of asthma, dm, cad,osteopenia, oa  Clinical Impression  On eval, pt required Min assist for mobility-walked ~10 feet in room with RW. Generally weak. Fatigues quickly with minimal activity. Audible wheezing noted during session. Discussed d/c plan with pt/family-agreeable to ST rehab at [facility].     Follow Up Recommendations SNF    Equipment Recommendations  None recommended by PT    Recommendations for Other Services OT consult     Precautions / Restrictions Precautions Precautions: Fall Restrictions Weight Bearing Restrictions: No      Mobility  Bed Mobility Overal bed mobility: Needs Assistance Bed Mobility: Supine to Sit     Supine to sit: HOB elevated;Min guard     General bed mobility comments: close guard for safety. increased time. moderate reliance on bedrail  Transfers Overall transfer level: Needs assistance Equipment used: Rolling walker (2 wheeled) Transfers: Sit to/from Stand Sit to Stand: Min assist         General transfer comment: assist to rise, stabilize, control descent. x2. VC safety, hand placement  Ambulation/Gait Ambulation/Gait assistance: Min assist Ambulation Distance (Feet): 10 Feet Assistive device: Rolling walker (2 wheeled) Gait Pattern/deviations: Step-through pattern;Decreased stride length     General Gait Details: assist to stabilize. pt fatigues quickly. audible wheezing noted. O2 sats 94% on RA  Stairs            Wheelchair Mobility    Modified Rankin (Stroke Patients Only)       Balance Overall balance assessment: Needs assistance         Standing balance support: Bilateral upper extremity supported;During functional activity Standing balance-Leahy Scale: Poor                                Pertinent Vitals/Pain Pain Assessment: No/denies pain    Home Living Family/patient expects to be discharged to:: Private residence Living Arrangements: Children;Other relatives Available Help at Discharge: Family Type of Home:  (condo) Home Access: Stairs to enter   [employer] of Steps: 1 Home Layout: One level Home Equipment: [place] - 4 wheels;Cane - single point      Prior Function Level of Independence: Independent with assistive device(s);Needs assistance      ADL's / Homemaking Assistance Needed: assist with bathing, dressing on occasion  Comments: uses cane primarily.      Hand Dominance        Extremity/Trunk Assessment   Upper Extremity Assessment: Generalized weakness           Lower Extremity Assessment: Generalized weakness      Cervical / Trunk Assessment: Kyphotic  Communication   Communication: No difficulties  Cognition Arousal/Alertness: Awake/alert Behavior During Therapy: WFL for tasks assessed/performed Overall Cognitive Status: Within Functional Limits for tasks assessed                      General Comments      Exercises        Assessment/Plan    PT Assessment Patient needs continued PT services  PT Diagnosis Difficulty walking;Generalized weakness   PT Problem List Decreased strength;Decreased activity tolerance;Decreased balance;Decreased mobility;Decreased knowledge of use of DME  PT Treatment Interventions DME instruction;Gait training;Functional mobility training;Therapeutic activities;Patient/family education;Balance training;Therapeutic exercise   PT Goals (Current goals can  be found in the Care Plan section) Acute Rehab PT Goals Patient Stated Goal: to get stronger so she can go home PT Goal Formulation: With patient/family Time For Goal Achievement: 08/01/15 Potential to Achieve Goals: Good    Frequency Min 3X/week   Barriers to discharge        Co-evaluation                End of Session   Activity Tolerance: Patient limited by fatigue Patient left: in chair;with call bell/phone within reach;with chair alarm set           Time: [id number] PT Time Calculation (min) (ACUTE ONLY): 31 min   Charges:   PT Evaluation $Initial PT Evaluation Tier I: 1 Procedure PT Treatments $Gait Training: 8-22 mins   PT G Codes:        Weston Anna, MPT Pager: [phone]

## 2015-07-18 NOTE — Care Management Important Message (Signed)
Important Message  Patient Details  Name: [name] MRN: [id number] Date of Birth: 1925-08-03   Medicare Important Message Given:  Yes    Camillo Flaming 07/18/2015, 12:18 [place] Message  Patient Details  Name: [name] MRN: [id number] Date of Birth: [birth year]   Medicare Important Message Given:  Yes    Camillo Flaming 07/18/2015, 12:18 PM

## 2015-07-18 NOTE — Telephone Encounter (Signed)
Received records from [place] for appointment on 08/10/15 with Dr Claiborne Billings.  Records given to [facility] (medical records) for Dr Evette Georges schedule on 08/10/15. lp

## 2015-07-18 NOTE — NC FL2 (Signed)
[place] LEVEL OF CARE SCREENING TOOL     IDENTIFICATION  Patient Name: [name] Birthdate: [birth year] Sex: female Admission Date (Current Location): 07/15/2015  [facility] and [place] Number: Herbalist and Address:  [facility],  [place], [place]      Provider Number: [id number]  Attending Physician Name and Address:  Donne Hazel, MD  Relative Name and Phone Number:       Current Level of Care: Hospital Recommended Level of Care: [place] Prior Approval Number:    Date Approved/Denied:   PASRR Number:   [id number]  Discharge Plan: SNF    Current Diagnoses: Patient Active Problem List   Diagnosis Date Noted  . Diabetes mellitus type 2 without retinopathy ([place]) 07/15/2015  . Sepsis due to pneumonia ([place]) 07/15/2015  . GERD (gastroesophageal reflux disease) 05/08/2015  . Left bundle branch block 07/31/2014  . Asthma exacerbation 01/18/2014  . Abdominal pain, other specified site 01/19/2013  . Nonspecific abnormal electrocardiogram (ECG) - intermittent BBB 12/01/2012  . HTN (hypertension) 11/30/2012  . HLD (hyperlipidemia) 11/30/2012  . Chest pain 11/30/2012  . PAF - with RVR, converted sponateously 11/30/2012  . Hypothyroid 11/30/2012  . DIABETES MELLITUS 07/08/2007  . ANXIETY 07/08/2007  . Coronary atherosclerosis 07/08/2007  . Asthma with bronchitis 07/08/2007  . Seasonal and perennial allergic rhinitis 07/08/2007  . ARTHRITIS 07/08/2007    Orientation ACTIVITIES/SOCIAL BLADDER RESPIRATION    Self, Time, Situation, Place  Active Continent O2 (As needed) (1 L)  BEHAVIORAL SYMPTOMS/MOOD NEUROLOGICAL BOWEL NUTRITION STATUS      Continent Diet  PHYSICIAN VISITS COMMUNICATION OF NEEDS Height & Weight Skin    Verbally 5\' 1"  (154.9 cm) 202 lbs. Normal          AMBULATORY STATUS RESPIRATION    Assist extensive O2 (As needed) (1 L)      Personal Care Assistance Level of  Assistance  Bathing, Dressing     Dressing Assistance: Limited assistance      Functional Limitations [place]  PT (By licensed PT), OT (By licensed OT)                   Additional Factors Info  Code Status, Allergies Code Status Info:  (Full /code) Allergies Info: NKDA           Current Medications (07/18/2015):  This is the current hospital active medication list Current Facility-Administered Medications  Medication Dose Route Frequency Provider Last Rate Last Dose  . 0.9 %  sodium chloride infusion   Intravenous Continuous Donne Hazel, MD 75 mL/hr at 07/18/15 [phone]    . acetaminophen (TYLENOL) tablet 650 mg  650 mg Oral Q6H PRN Donne Hazel, MD       Or  . acetaminophen (TYLENOL) suppository 650 mg  650 mg Rectal Q6H PRN Donne Hazel, MD      . antiseptic oral rinse (CPC / CETYLPYRIDINIUM CHLORIDE 0.05%) solution 7 mL  7 mL Mouth Rinse BID Donne Hazel, MD   7 mL at 07/17/15 1000  . aspirin EC tablet 81 mg  81 mg Oral QHS Donne Hazel, MD   81 mg at 07/17/15 2224  . azithromycin (ZITHROMAX) tablet 500 mg  500 mg Oral Daily Donne Hazel, MD      . benzonatate (TESSALON) capsule 200 mg  200 mg  Oral TID PRN Donne Hazel, MD   200 mg at 07/16/15 1003  . cefUROXime (CEFTIN) tablet 250 mg  250 mg Oral BID WC Donne Hazel, MD   250 mg at 07/18/15 0830  . clopidogrel (PLAVIX) tablet 75 mg  75 mg Oral Daily Donne Hazel, MD   75 mg at 07/17/15 1020  . clorazepate (TRANXENE) tablet 15 mg  15 mg Oral QHS Donne Hazel, MD   15 mg at 07/17/15 2224  . ezetimibe (ZETIA) tablet 10 mg  10 mg Oral QHS Donne Hazel, MD   10 mg at 07/17/15 2224  . guaiFENesin (MUCINEX) 12 hr tablet 1,200 mg  1,200 mg Oral BID Donne Hazel, MD   1,200 mg at 07/17/15 2224  . heparin injection 5,000 Units  5,000 Units Subcutaneous 3 times per day Donne Hazel, MD   5,000 Units at 07/18/15 0545  . irbesartan (AVAPRO) tablet 300 mg  300  mg Oral Daily Donne Hazel, MD   300 mg at 07/17/15 1020   And  . hydrochlorothiazide (HYDRODIURIL) tablet 25 mg  25 mg Oral Daily Donne Hazel, MD   25 mg at 07/17/15 1020  . insulin aspart (novoLOG) injection 0-15 Units  0-15 Units Subcutaneous TID WC Donne Hazel, MD   3 Units at 07/18/15 [phone]  . insulin aspart (novoLOG) injection 0-5 Units  0-5 Units Subcutaneous QHS Donne Hazel, MD   2 Units at 07/17/15 2223  . ipratropium-albuterol (DUONEB) 0.5-2.5 (3) MG/3ML nebulizer solution 3 mL  3 mL Nebulization Q2H PRN Donne Hazel, MD      . ipratropium-albuterol (DUONEB) 0.5-2.5 (3) MG/3ML nebulizer solution 3 mL  3 mL Nebulization TID Donne Hazel, MD   3 mL at 07/17/15 2027  . levothyroxine (SYNTHROID, LEVOTHROID) tablet 25 mcg  25 mcg Oral QAC breakfast Donne Hazel, MD   25 mcg at 07/18/15 0830  . LORazepam (ATIVAN) tablet 1 mg  1 mg Oral Q8H PRN Donne Hazel, MD      . methylPREDNISolone sodium succinate (SOLU-MEDROL) 40 mg/mL injection 40 mg  40 mg Intravenous Q24H Donne Hazel, MD   40 mg at 07/18/15 0835  . ondansetron (ZOFRAN) tablet 4 mg  4 mg Oral Q6H PRN Donne Hazel, MD       Or  . ondansetron [facility]) injection 4 mg  4 mg Intravenous Q6H PRN Donne Hazel, MD      . pantoprazole (PROTONIX) EC tablet 40 mg  40 mg Oral BID Donne Hazel, MD   40 mg at 07/17/15 2224  . QUEtiapine (SEROQUEL) tablet 50 mg  50 mg Oral Q breakfast Donne Hazel, MD   50 mg at 07/18/15 0830  . rosuvastatin (CRESTOR) tablet 20 mg  20 mg Oral QHS Donne Hazel, MD   20 mg at 07/17/15 2224  . sodium chloride 0.9 % injection 3 mL  3 mL Intravenous Q12H Donne Hazel, MD   3 mL at 07/17/15 1021  . topiramate (TOPAMAX) tablet 25 mg  25 mg Oral QHS Donne Hazel, MD   25 mg at 07/17/15 2224     Discharge Medications: Please see discharge summary for a list of discharge medications.  Relevant Imaging Results:  Relevant Lab Results:  Recent Labs    Additional Information SSN  [id number]  Ludwig Clarks, LCSW

## 2015-07-18 NOTE — Progress Notes (Signed)
[facility] PROGRESS NOTE  [name] [id number] DOB: Sep 16, 1924 DOA: 07/15/2015 PCP: [name], MD  HPI/Brief narrative 81 y.o. female with a hx of asthma, DM2, CAD, HTN, who presents to the ED with worsening productive cough, wheezing and sob. Pt was seen in the ED 2 weeks prior to admit and was discharged for asthma exacerbation. Patient presented febrile with hypotension and with lactic acidosis. The patient was admitted for further work up.  Assessment/Plan:  1. Likely CAP with sepsis present on admission 1. Pt presented with hypotension, fevers, mild leukocytosis and lactic acidosis 2. CXR largely unremarkable, however pt is having increased productive cough prior to admit 3. Patient was continued on empiric azithromycin and rocephin and has shown much improvement 4. Will transition to PO azithromycin and PO ceftin 5. flu neg, strep pneumo neg, hiv neg 6. Legionella neg 7. Lactate improved 8. On min O2 support 2. Asthma exacerbation 1. Will cont on scheduled nebs and steroids 2. Cont O2 as needed 3. Continuing to wean IV solumedrol 3. DM2 1. Will cont on SSI coverage 4. HLD 1. Cont statin per home regimen 5. CAD 1. Stable 2. Cont plavix and ASA 3. Hold metoprolol secondary to hypotension 6. HTN 1. Pt presenting hypotensive 2. BP now improved with IVF 3. Holding BP meds until BP further improves 7. DVT prophylaxis 1. Heparin subQ 8. Constipation 1. Much improved with mg citrate  Code Status: Full Family Communication: Pt in room, pt's son at bedside Disposition Plan: Home when on RA and PO meds   Consultants:    Procedures:    Antibiotics: Anti-infectives    Start     Dose/Rate Route Frequency Ordered Stop   07/18/15 1000  azithromycin (ZITHROMAX) tablet 500 mg     500 mg Oral Daily 07/18/15 0743     07/18/15 0800  cefUROXime (CEFTIN) tablet 250 mg     250 mg Oral 2 times daily with meals 07/18/15 0743     07/15/15 1830  cefTRIAXone  (ROCEPHIN) 1 g in dextrose 5 % 50 mL IVPB  Status:  Discontinued     1 g 100 mL/hr over 30 Minutes Intravenous Every 24 hours 07/15/15 1824 07/18/15 0743   07/15/15 1830  azithromycin (ZITHROMAX) 500 mg in dextrose 5 % 250 mL IVPB  Status:  Discontinued     500 mg 250 mL/hr over 60 Minutes Intravenous Every 24 hours 07/15/15 1824 07/18/15 0743   07/15/15 1515  cefTRIAXone (ROCEPHIN) 1 g in dextrose 5 % 50 mL IVPB     1 g 100 mL/hr over 30 Minutes Intravenous  Once 07/15/15 1512 07/15/15 1639   07/15/15 1515  azithromycin (ZITHROMAX) 500 mg in dextrose 5 % 250 mL IVPB     500 mg 250 mL/hr over 60 Minutes Intravenous  Once 07/15/15 1512 07/15/15 1739      HPI/Subjective: Feels much better today  Objective: Filed Vitals:   07/18/15 0109 07/18/15 0517 07/18/15 1015 07/18/15 1519  BP:  160/80  119/71  Pulse:  88  86  Temp:  98 F (36.7 C)  98.7 F (37.1 C)  TempSrc:  Oral  Oral  Resp:  16  18  Height:      Weight:      SpO2: 96% 97% 96% 98%    Intake/Output Summary (Last 24 hours) at 07/18/15 1727 Last data filed at 07/18/15 1240  Gross per 24 hour  Intake 3192.08 ml  Output   1651 ml  Net 1541.08 ml  Filed Weights   07/15/15 1951 07/16/15 0400  Weight: 89 kg (196 lb 3.4 oz) 91.8 kg (202 lb 6.1 oz)    Exam:   General:  Awake, laying in bed, in nad  Cardiovascular: regular, s1, s2  Respiratory: normal resp effort, trace end-expiratory wheezing  Abdomen: soft,distended, pos bs  Musculoskeletal: perfused, no clubbing   Data Reviewed: Basic Metabolic Panel:  Recent Labs Lab 07/15/15 1515 07/15/15 1846 07/16/15 0205 07/17/15 0555 07/18/15 0450  NA 141  --  136 138 143  K 3.4*  --  3.0* 3.6 4.0  CL 106  --  105 108 112*  CO2 24  --  21* 21* 24  GLUCOSE 218*  --  284* 209* 233*  BUN 31*  --  28* 28* 28*  CREATININE 1.21* 1.22* 1.08* 0.90 0.83  CALCIUM 8.8*  --  8.1* 8.0* 8.3*  MG  --   --   --  2.2  --    Liver Function Tests:  Recent Labs Lab  07/15/15 1515 07/16/15 0205  AST 26 42*  ALT 23 27  ALKPHOS 51 50  BILITOT 0.5 0.3  PROT 6.7 6.9  ALBUMIN 3.4* 3.5   No results for input(s): LIPASE, AMYLASE in the last 168 hours. No results for input(s): AMMONIA in the last 168 hours. CBC:  Recent Labs Lab 07/15/15 1515 07/15/15 1846 07/16/15 0205 07/18/15 0450 07/18/15 1210  WBC 11.9* 11.5* 7.6 7.4  --   NEUTROABS 10.1*  --   --   --   --   HGB 11.0* 10.1* 11.2* 8.9* 9.9*  HCT 36.1 31.6* 35.1* 29.4* 31.6*  MCV 86.4 85.6 85.0 84.7  --   PLT 166 157 150 180  --    Cardiac Enzymes: No results for input(s): CKTOTAL, CKMB, CKMBINDEX, TROPONINI in the last 168 hours. BNP (last 3 results)  Recent Labs  07/15/15 1515  BNP 44.2    ProBNP (last 3 results) No results for input(s): PROBNP in the last 8760 hours.  CBG:  Recent Labs Lab 07/17/15 1737 07/17/15 2130 07/18/15 0751 07/18/15 1209 07/18/15 1641  GLUCAP 195* 224* 195* 169* 202*    Recent Results (from the past 240 hour(s))  Blood Culture (routine x 2)     Status: None (Preliminary result)   Collection Time: 07/15/15  3:11 PM  Result Value Ref Range Status   Specimen Description BLOOD LEFT WRIST  Final   Special Requests BOTTLES DRAWN AEROBIC AND ANAEROBIC 5ML  Final   Culture   Final    NO GROWTH 3 DAYS Performed at [facility]    Report Status PENDING  Incomplete  Blood Culture (routine x 2)     Status: None (Preliminary result)   Collection Time: 07/15/15  3:15 PM  Result Value Ref Range Status   Specimen Description BLOOD RIGHT HAND  Final   Special Requests BOTTLES DRAWN AEROBIC AND ANAEROBIC 5ML  Final   Culture   Final    NO GROWTH 3 DAYS Performed at [facility]    Report Status PENDING  Incomplete  Urine culture     Status: None   Collection Time: 07/15/15  4:56 PM  Result Value Ref Range Status   Specimen Description URINE, CATHETERIZED  Final   Special Requests NONE  Final   Culture   Final    NO GROWTH 2  DAYS Performed at [facility]    Report Status 07/17/2015 FINAL  Final  MRSA PCR Screening  Status: None   Collection Time: 07/15/15 10:48 PM  Result Value Ref Range Status   MRSA by PCR NEGATIVE NEGATIVE Final    Comment:        The GeneXpert MRSA Assay (FDA approved for NASAL specimens only), is one component of a comprehensive MRSA colonization surveillance program. It is not intended to diagnose MRSA infection nor to guide or monitor treatment for MRSA infections.      Studies: No results found.  Scheduled Meds: . antiseptic oral rinse  7 mL Mouth Rinse BID  . aspirin EC  81 mg Oral QHS  . azithromycin  500 mg Oral Daily  . cefUROXime  250 mg Oral BID WC  . clopidogrel  75 mg Oral Daily  . clorazepate  15 mg Oral QHS  . ezetimibe  10 mg Oral QHS  . guaiFENesin  1,200 mg Oral BID  . heparin  5,000 Units Subcutaneous 3 times per day  . irbesartan  300 mg Oral Daily   And  . hydrochlorothiazide  25 mg Oral Daily  . insulin aspart  0-15 Units Subcutaneous TID WC  . insulin aspart  0-5 Units Subcutaneous QHS  . ipratropium-albuterol  3 mL Nebulization TID  . levothyroxine  25 mcg Oral QAC breakfast  . methylPREDNISolone (SOLU-MEDROL) injection  40 mg Intravenous Q24H  . pantoprazole  40 mg Oral BID  . QUEtiapine  50 mg Oral Q breakfast  . rosuvastatin  20 mg Oral QHS  . sodium chloride  3 mL Intravenous Q12H  . topiramate  25 mg Oral QHS   Continuous Infusions: . sodium chloride 75 mL/hr at 07/18/15 [id number]    Principal Problem:   Sepsis due to pneumonia [facility]) Active Problems:   HTN (hypertension)   HLD (hyperlipidemia)   Asthma exacerbation   Diabetes mellitus type 2 without retinopathy ([place])   [facility], [place] [facility] Pager [phone]. If 7PM-7AM, please contact night-coverage at [email or web address], password [facility] 07/18/2015, 5:27 PM  LOS: 3 days

## 2015-07-18 NOTE — Clinical Social Work Placement (Signed)
   CLINICAL SOCIAL WORK PLACEMENT  NOTE  Date:  07/18/2015  Patient Details  Name: [name] MRN: [id number] Date of Birth: 09-05-1924  Clinical Social Work is seeking post-discharge placement for this patient at the [place] level of care (*CSW will initial, date and re-position this form in  chart as items are completed):  No   Patient/family provided with [place] Work Department's list of facilities offering this level of care within the geographic area requested by the patient (or if unable, by the patient's family).  Yes   Patient/family informed of their freedom to choose among providers that offer the needed level of care, that participate in Medicare, Medicaid or managed care program needed by the patient, have an available bed and are willing to accept the patient.  No   Patient/family informed of [place]'s ownership interest in [facility] and [facility], as well as of the fact that they are under no obligation to receive care at these facilities.  PASRR submitted to EDS on 07/18/15     PASRR number received on 07/18/15     Existing PASRR number confirmed on       FL2 transmitted to all facilities in geographic area requested by pt/family on 07/18/15     FL2 transmitted to all facilities within larger geographic area on       Patient informed that his/her managed care company has contracts with or will negotiate with certain facilities, including the following:            Patient/family informed of bed offers received.  Patient chooses bed at       Physician recommends and patient chooses bed at      Patient to be transferred to   on  .  Patient to be transferred to facility by       Patient family notified on   of transfer.  Name of family member notified:        PHYSICIAN Please prepare priority discharge summary, including medications, Please sign FL2     Additional Comment:     _______________________________________________ Ludwig Clarks, LCSW 07/18/2015, 12:39 PM

## 2015-07-18 NOTE — Clinical Social Work Note (Signed)
Clinical Social Work Assessment  Patient Details  Name: [name] MRN: [id number] Date of Birth: [birth year]  Date of referral:  07/18/15               Reason for consult:  Facility Placement                Permission sought to share information with:  Chartered certified accountant granted to share information::  Yes, Verbal Permission Granted  Name::     Son- Engineer, maintenance (IT)::     Relationship::  SNF's  Contact Information:     Housing/Transportation Living arrangements for the past 2 months:  Single Family Home Source of Information:  Patient, Adult Children Patient Interpreter Needed:  None Criminal Activity/Legal Involvement Pertinent to Current Situation/Hospitalization:  No - Comment as needed Significant Relationships:  Adult Children, Community Support, Friend Lives with:    Do you feel safe going back to the place where you live?  No Need for family participation in patient care:  Yes (Comment)  Care giving concerns:  CSW met with patient and her son [name] at bedside- patient admitted from home where she usually has a family member with her- she also has a brother nearby.     Social Worker assessment / plan:  Discussed short SNF stay with patient and son- they are agreeable- FL2 and PASARR completed for SNF search. Employment status:  Retired Forensic scientist:  [employer] PT Recommendations:  [place] / Referral to community resources:  [place]  Patient/Family's Response to care:  Both patient and son understand plan of care including plans for SNF search.   Patient/Family's Understanding of and Emotional Response to Diagnosis, Current Treatment, and Prognosis:  Family understands her medical needs and plan of care for this at this time. They are hopeful for a 2 week SNF stay and then back home.   Emotional Assessment Appearance:  Developmentally appropriate Attitude/Demeanor/Rapport:    Affect (typically  observed):  Appropriate, Accepting, Hopeful Orientation:  Oriented to Self, Oriented to Place, Oriented to  Time, Oriented to Situation Alcohol / Substance use:  Not Applicable Psych involvement (Current and /or in the community):  No (Comment)  Discharge Needs  Concerns to be addressed:    Readmission within the last 30 days:  No Current discharge risk:  None Barriers to Discharge:  No Barriers Identified   Ludwig Clarks, LCSW 07/18/2015, 12:22 PM

## 2015-07-18 NOTE — Progress Notes (Signed)
OT Cancellation Note  Patient Details Name: [name] MRN: [id number] DOB: 13-Feb-1925   Cancelled Treatment:    Noted plans for SNF- will defer OT eval to SNF. Kari Baars, [place] Payton Mccallum D 07/18/2015, 11:22 AM

## 2015-07-19 DIAGNOSIS — M6281 Muscle weakness (generalized): Secondary | ICD-10-CM | POA: Diagnosis not present

## 2015-07-19 DIAGNOSIS — D509 Iron deficiency anemia, unspecified: Secondary | ICD-10-CM | POA: Diagnosis not present

## 2015-07-19 DIAGNOSIS — R5381 Other malaise: Secondary | ICD-10-CM | POA: Diagnosis not present

## 2015-07-19 DIAGNOSIS — Z5189 Encounter for other specified aftercare: Secondary | ICD-10-CM | POA: Diagnosis not present

## 2015-07-19 DIAGNOSIS — J45909 Unspecified asthma, uncomplicated: Secondary | ICD-10-CM | POA: Diagnosis not present

## 2015-07-19 DIAGNOSIS — I10 Essential (primary) hypertension: Secondary | ICD-10-CM | POA: Diagnosis not present

## 2015-07-19 DIAGNOSIS — R197 Diarrhea, unspecified: Secondary | ICD-10-CM | POA: Diagnosis not present

## 2015-07-19 DIAGNOSIS — E785 Hyperlipidemia, unspecified: Secondary | ICD-10-CM | POA: Diagnosis not present

## 2015-07-19 DIAGNOSIS — R195 Other fecal abnormalities: Secondary | ICD-10-CM | POA: Diagnosis not present

## 2015-07-19 DIAGNOSIS — G250 Essential tremor: Secondary | ICD-10-CM | POA: Diagnosis not present

## 2015-07-19 DIAGNOSIS — I2510 Atherosclerotic heart disease of native coronary artery without angina pectoris: Secondary | ICD-10-CM | POA: Diagnosis not present

## 2015-07-19 DIAGNOSIS — J189 Pneumonia, unspecified organism: Secondary | ICD-10-CM | POA: Diagnosis not present

## 2015-07-19 DIAGNOSIS — D508 Other iron deficiency anemias: Secondary | ICD-10-CM | POA: Diagnosis not present

## 2015-07-19 DIAGNOSIS — R2681 Unsteadiness on feet: Secondary | ICD-10-CM | POA: Diagnosis not present

## 2015-07-19 DIAGNOSIS — E876 Hypokalemia: Secondary | ICD-10-CM | POA: Diagnosis not present

## 2015-07-19 DIAGNOSIS — J45901 Unspecified asthma with (acute) exacerbation: Secondary | ICD-10-CM | POA: Diagnosis not present

## 2015-07-19 DIAGNOSIS — F419 Anxiety disorder, unspecified: Secondary | ICD-10-CM | POA: Diagnosis not present

## 2015-07-19 DIAGNOSIS — K219 Gastro-esophageal reflux disease without esophagitis: Secondary | ICD-10-CM | POA: Diagnosis not present

## 2015-07-19 DIAGNOSIS — R600 Localized edema: Secondary | ICD-10-CM | POA: Diagnosis not present

## 2015-07-19 DIAGNOSIS — E119 Type 2 diabetes mellitus without complications: Secondary | ICD-10-CM | POA: Diagnosis not present

## 2015-07-19 DIAGNOSIS — A419 Sepsis, unspecified organism: Secondary | ICD-10-CM | POA: Diagnosis not present

## 2015-07-19 DIAGNOSIS — F411 Generalized anxiety disorder: Secondary | ICD-10-CM | POA: Diagnosis not present

## 2015-07-19 DIAGNOSIS — E039 Hypothyroidism, unspecified: Secondary | ICD-10-CM | POA: Diagnosis not present

## 2015-07-19 LAB — GLUCOSE, CAPILLARY
GLUCOSE-CAPILLARY: 135 mg/dL — AB (ref 65–99)
Glucose-Capillary: 153 mg/dL — ABNORMAL HIGH (ref 65–99)

## 2015-07-19 MED ORDER — AZITHROMYCIN 500 MG PO TABS: 500.0000 mg | ORAL_TABLET | Freq: Every day | ORAL | Status: DC

## 2015-07-19 MED ORDER — PREDNISONE 5 MG PO TABS: 5.0000 mg | ORAL_TABLET | Freq: Every day | ORAL | Status: DC

## 2015-07-19 MED ORDER — ACETAMINOPHEN-CODEINE 120-12 MG/5ML PO SOLN: 10.0000 mL | ORAL | Status: DC | PRN

## 2015-07-19 MED ORDER — CEFUROXIME AXETIL 250 MG PO TABS: 250.0000 mg | ORAL_TABLET | Freq: Two times a day (BID) | ORAL | Status: DC

## 2015-07-19 NOTE — Discharge Summary (Signed)
Physician Discharge Summary  [name] [id number] DOB: 25-Dec-1924 DOA: 07/15/2015  PCP: [name], MD  Admit date: 07/15/2015 Discharge date: 07/19/2015  Time spent: 20 minutes  Recommendations for Outpatient Follow-up:  1. Follow up with PCP in 2-3 weeks   Discharge Diagnoses:  Principal Problem:   Sepsis due to pneumonia [facility]) Active Problems:   HTN (hypertension)   HLD (hyperlipidemia)   Asthma exacerbation   Diabetes mellitus type 2 without retinopathy [facility])   Discharge Condition: Improved  Diet recommendation: Diabetic  Filed Weights   07/15/15 1951 07/16/15 0400  Weight: 89 kg (196 lb 3.4 oz) 91.8 kg (202 lb 6.1 oz)    History of present illness:  Please review dictated H and P from 11/25 for details. Briefly, 81 y.o. female with a hx of asthma, DM2, CAD, HTN, who presents to the ED with worsening productive cough, wheezing and sob. Pt was seen in the ED 2 weeks prior to admit and was discharged for asthma exacerbation. Patient presented febrile with hypotension and with lactic acidosis. The patient was admitted for further work up.  Hospital Course:  1. Likely CAP with sepsis present on admission 1. Pt presented with hypotension, fevers, mild leukocytosis and lactic acidosis 2. CXR was largely unremarkable, however pt was having increased productive cough prior to admit 3. Patient was continued on empiric azithromycin and rocephin and has shown much improvement 4. Patient was transitioned to PO azithromycin and PO ceftin and will complete 3 more days of tx on discharge 5. flu neg, strep pneumo neg, hiv neg 6. Legionella neg 7. Lactate improved 8. On min O2 support 2. Asthma exacerbation 1. Will cont on scheduled nebs and steroids 2. Cont O2 as needed 3. Continuing to wean IV solumedrol 3. DM2 1. Will cont on SSI coverage 4. HLD 1. Cont statin per home regimen 5. CAD 1. Stable 2. Cont plavix and ASA 3. Hold metoprolol secondary to  hypotension 6. HTN 1. Pt presenting hypotensive 2. BP now improved with IVF 3. Patient is to continue micardis per home regimen. Toprol XL at 50mg  remained on hold and should be resumed when BP later tolerates 7. DVT prophylaxis 1. Heparin subQ while inpatient 8. Constipation 1. Much improved with mg citrate   Discharge Exam: Filed Vitals:   07/18/15 2029 07/18/15 2056 07/19/15 0602 07/19/15 0959  BP:  119/57 176/80   Pulse:  97 99   Temp:  98.3 F (36.8 C) 98.4 F (36.9 C)   TempSrc:  Oral Oral   Resp:  18 20   Height:      Weight:      SpO2: 98% 97% 97% 95%    General: Awake, in nad Cardiovascular: regular, s1, s2 Respiratory: normal resp effort, no wheezing  Discharge Instructions     Medication List    STOP taking these medications        metoprolol succinate 50 MG 24 hr tablet  Commonly known as:  TOPROL-XL      TAKE these medications        acetaminophen 500 MG tablet  Commonly known as:  TYLENOL  Take 500 mg by mouth every 6 (six) hours as needed for moderate pain or headache.     acetaminophen-codeine 120-12 MG/5ML solution  Take 10 mLs by mouth every 4 (four) hours as needed (cough).     aspirin EC 81 MG tablet  Take 81 mg by mouth at bedtime.     azelastine 0.1 % nasal spray  Commonly known  as:  ASTELIN  Place 1 spray into the nose 2 (two) times daily. Use in each nostril as directed     azithromycin 500 MG tablet  Commonly known as:  ZITHROMAX  Take 1 tablet (500 mg total) by mouth daily.     benzonatate 200 MG capsule  Commonly known as:  TESSALON  Take 1 capsule (200 mg total) by mouth 3 (three) times daily as needed for cough.     cefUROXime 250 MG tablet  Commonly known as:  CEFTIN  Take 1 tablet (250 mg total) by mouth 2 (two) times daily with a meal.     clopidogrel 75 MG tablet  Commonly known as:  PLAVIX  Take 75 mg by mouth daily.     clorazepate 15 MG tablet  Commonly known as:  TRANXENE  Take 15 mg by mouth at bedtime.  anxiety     ezetimibe 10 MG tablet  Commonly known as:  ZETIA  Take 10 mg by mouth at bedtime.     glimepiride 1 MG tablet  Commonly known as:  AMARYL  Take 1 mg by mouth daily before breakfast.     Guaifenesin 1200 MG Tb12  Take 1 tablet (1,200 mg total) by mouth 2 (two) times daily.     ibuprofen 200 MG tablet  Commonly known as:  ADVIL,MOTRIN  Take 400 mg by mouth every 6 (six) hours as needed for headache or moderate pain.     levothyroxine 50 MCG tablet  Commonly known as:  SYNTHROID, LEVOTHROID  Take 25 mcg by mouth daily.     LORazepam 1 MG tablet  Commonly known as:  ATIVAN  Take 1 mg by mouth every 8 (eight) hours as needed for anxiety.     MICARDIS HCT 80-25 MG tablet  Generic drug:  telmisartan-hydrochlorothiazide  Take 1 tablet by mouth daily.     montelukast 10 MG tablet  Commonly known as:  SINGULAIR  Take 10 mg by mouth at bedtime.     multivitamin with minerals Tabs tablet  Take 1 tablet by mouth daily.     nitroGLYCERIN 0.4 MG SL tablet  Commonly known as:  NITROSTAT  Place 0.4 mg under the tongue every 5 (five) minutes as needed. For chest pain     pantoprazole 40 MG tablet  Commonly known as:  PROTONIX  Take 40 mg by mouth 2 (two) times daily.     predniSONE 5 MG tablet  Commonly known as:  DELTASONE  Take 1 tablet (5 mg total) by mouth daily with breakfast.     PROAIR HFA 108 (90 BASE) MCG/ACT inhaler  Generic drug:  albuterol  Inhale 1 puff into the lungs as needed for wheezing.     albuterol (2.5 MG/3ML) 0.083% nebulizer solution  Commonly known as:  PROVENTIL  Take 3 mLs (2.5 mg total) by nebulization every 6 (six) hours as needed for wheezing.     QUEtiapine 25 MG tablet  Commonly known as:  SEROQUEL  Take 50 mg by mouth daily with breakfast.     rosuvastatin 20 MG tablet  Commonly known as:  CRESTOR  Take 20 mg by mouth at bedtime.     topiramate 25 MG tablet  Commonly known as:  TOPAMAX  Take 25 mg by mouth at bedtime.      Umeclidinium-Vilanterol 62.5-25 MCG/INH Aepb  Commonly known as:  ANORO ELLIPTA  Inhale 1 puff into the lungs daily.       No Known Allergies Follow-up Information  Follow up with [name], MD In 2 weeks.   Specialty:  Internal Medicine   Why:  Hospital follow up   Contact information:   [address] [place] [address] [phone]        The results of significant diagnostics from this hospitalization (including imaging, microbiology, ancillary and laboratory) are listed below for reference.    Significant Diagnostic Studies: Dg Chest 2 View  07/15/2015  CLINICAL DATA:  Fever, productive cough, and progressive dyspnea. EXAM: CHEST  2 VIEW COMPARISON:  06/25/2015 and 01/20/2015 FINDINGS: There is slight peribronchial thickening. No discrete infiltrates or effusions. Slight atelectasis at the right lung base. Heart size and vascularity are normal. No acute osseous abnormality. IMPRESSION: Bronchitic changes with slight right base atelectasis anteriorly. Electronically Signed   By: Lorriane Shire M.D.   On: 07/15/2015 15:43   Dg Chest 2 View  06/25/2015  CLINICAL DATA:  Asthma x 1 week, cough with phlegm x 6 months HTN,diabetes Hx  Of coronary artery disease and cardiac cath-2000 EXAM: CHEST  2 VIEW COMPARISON:  01/20/2015 FINDINGS: Cardiac silhouette is top-normal in size. No mediastinal or hilar masses or evidence of adenopathy. Lungs are mildly hyperexpanded but clear. No pleural effusion or pneumothorax. Bony thorax is demineralized intact. No change from the prior study. IMPRESSION: No active cardiopulmonary disease. Electronically Signed   By: Lajean Manes M.D.   On: 06/25/2015 19:29    Microbiology: Recent Results (from the past 240 hour(s))  Blood Culture (routine x 2)     Status: None (Preliminary result)   Collection Time: 07/15/15  3:11 PM  Result Value Ref Range Status   Specimen Description BLOOD LEFT WRIST  Final   Special Requests BOTTLES  DRAWN AEROBIC AND ANAEROBIC 5ML  Final   Culture   Final    NO GROWTH 3 DAYS Performed at [facility]    Report Status PENDING  Incomplete  Blood Culture (routine x 2)     Status: None (Preliminary result)   Collection Time: 07/15/15  3:15 PM  Result Value Ref Range Status   Specimen Description BLOOD RIGHT HAND  Final   Special Requests BOTTLES DRAWN AEROBIC AND ANAEROBIC 5ML  Final   Culture   Final    NO GROWTH 3 DAYS Performed at [facility]    Report Status PENDING  Incomplete  Urine culture     Status: None   Collection Time: 07/15/15  4:56 PM  Result Value Ref Range Status   Specimen Description URINE, CATHETERIZED  Final   Special Requests NONE  Final   Culture   Final    NO GROWTH 2 DAYS Performed at [facility]    Report Status 07/17/2015 FINAL  Final  MRSA PCR Screening     Status: None   Collection Time: 07/15/15 10:48 PM  Result Value Ref Range Status   MRSA by PCR NEGATIVE NEGATIVE Final    Comment:        The GeneXpert MRSA Assay (FDA approved for NASAL specimens only), is one component of a comprehensive MRSA colonization surveillance program. It is not intended to diagnose MRSA infection nor to guide or monitor treatment for MRSA infections.      Labs: Basic Metabolic Panel:  Recent Labs Lab 07/15/15 1515 07/15/15 1846 07/16/15 0205 07/17/15 0555 07/18/15 0450  NA 141  --  136 138 143  K 3.4*  --  3.0* 3.6 4.0  CL 106  --  105 108 112*  CO2 24  --  21* 21* 24  GLUCOSE 218*  --  284* 209* 233*  BUN 31*  --  28* 28* 28*  CREATININE 1.21* 1.22* 1.08* 0.90 0.83  CALCIUM 8.8*  --  8.1* 8.0* 8.3*  MG  --   --   --  2.2  --    Liver Function Tests:  Recent Labs Lab 07/15/15 1515 07/16/15 0205  AST 26 42*  ALT 23 27  ALKPHOS 51 50  BILITOT 0.5 0.3  PROT 6.7 6.9  ALBUMIN 3.4* 3.5   No results for input(s): LIPASE, AMYLASE in the last 168 hours. No results for input(s): AMMONIA in the last 168  hours. CBC:  Recent Labs Lab 07/15/15 1515 07/15/15 1846 07/16/15 0205 07/18/15 0450 07/18/15 1210  WBC 11.9* 11.5* 7.6 7.4  --   NEUTROABS 10.1*  --   --   --   --   HGB 11.0* 10.1* 11.2* 8.9* 9.9*  HCT 36.1 31.6* 35.1* 29.4* 31.6*  MCV 86.4 85.6 85.0 84.7  --   PLT 166 157 150 180  --    Cardiac Enzymes: No results for input(s): CKTOTAL, CKMB, CKMBINDEX, TROPONINI in the last 168 hours. BNP: BNP (last 3 results)  Recent Labs  07/15/15 1515  BNP 44.2    ProBNP (last 3 results) No results for input(s): PROBNP in the last 8760 hours.  CBG:  Recent Labs Lab 07/18/15 0751 07/18/15 1209 07/18/15 1641 07/18/15 2055 07/19/15 0736  GLUCAP 195* 169* 202* 189* 135*    Signed:  Quaid Yeakle K  [facility] 07/19/2015, 10:30 AM

## 2015-07-19 NOTE — Progress Notes (Signed)
Report received from previous RN Magdalen Spatz RN. Agree with previous assessment and no acute changes noted at this time with Pt's assessment at this time. Maintain plan of care for Pt

## 2015-07-19 NOTE — Progress Notes (Signed)
Report called to [facility]. VSS and no acute changes noted in Pt's assessment at time of transfer.

## 2015-07-19 NOTE — Progress Notes (Signed)
Patient for d/c today to SNF bed at  [facility]. Son and patient agreeable to this plan- plan transfer via EMS. Eduard Clos, MSW, [place]

## 2015-07-19 NOTE — Clinical Social Work Placement (Signed)
   CLINICAL SOCIAL WORK PLACEMENT  NOTE  Date:  07/19/2015  Patient Details  Name: [name] MRN: [id number] Date of Birth: [birth year]  Clinical Social Work is seeking post-discharge placement for this patient at the [place] level of care (*CSW will initial, date and re-position this form in  chart as items are completed):  No   Patient/family provided with [place] Work Department's list of facilities offering this level of care within the geographic area requested by the patient (or if unable, by the patient's family).  Yes   Patient/family informed of their freedom to choose among providers that offer the needed level of care, that participate in Medicare, Medicaid or managed care program needed by the patient, have an available bed and are willing to accept the patient.  No   Patient/family informed of [place]'s ownership interest in [facility] and [facility], as well as of the fact that they are under no obligation to receive care at these facilities.  PASRR submitted to EDS on 07/18/15     PASRR number received on 07/18/15     Existing PASRR number confirmed on       FL2 transmitted to all facilities in geographic area requested by pt/family on 07/18/15     FL2 transmitted to all facilities within larger geographic area on       Patient informed that his/her managed care company has contracts with or will negotiate with certain facilities, including the following:        Yes   Patient/family informed of bed offers received.  Patient chooses bed at [facility]     Physician recommends and patient chooses bed at      Patient to be transferred to [facility] on 07/19/15.  Patient to be transferred to facility by [facility]     Patient family notified on 07/19/15 of transfer.  Name of family member notified:  son- [name]     PHYSICIAN Please prepare priority discharge summary, including medications, Please sign FL2      Additional Comment:    _______________________________________________ Ludwig Clarks, LCSW 07/19/2015, 1:26 PM

## 2015-07-19 NOTE — Progress Notes (Signed)
CSW provided SNF bed offers to son- he has selected [facility]. Bed confirmed for today- will await MD for medical clearance.  Eduard Clos, MSW, [place]

## 2015-07-20 LAB — CULTURE, BLOOD (ROUTINE X 2)
CULTURE: NO GROWTH
Culture: NO GROWTH

## 2015-07-21 ENCOUNTER — Encounter: Payer: Self-pay | Admitting: Adult Health

## 2015-07-21 ENCOUNTER — Non-Acute Institutional Stay (SKILLED_NURSING_FACILITY): Payer: Medicare Other | Admitting: Adult Health

## 2015-07-21 DIAGNOSIS — I2510 Atherosclerotic heart disease of native coronary artery without angina pectoris: Secondary | ICD-10-CM

## 2015-07-21 DIAGNOSIS — J45909 Unspecified asthma, uncomplicated: Secondary | ICD-10-CM

## 2015-07-21 DIAGNOSIS — E119 Type 2 diabetes mellitus without complications: Secondary | ICD-10-CM | POA: Diagnosis not present

## 2015-07-21 DIAGNOSIS — I10 Essential (primary) hypertension: Secondary | ICD-10-CM

## 2015-07-21 DIAGNOSIS — F419 Anxiety disorder, unspecified: Secondary | ICD-10-CM | POA: Diagnosis not present

## 2015-07-21 DIAGNOSIS — E039 Hypothyroidism, unspecified: Secondary | ICD-10-CM

## 2015-07-21 DIAGNOSIS — J189 Pneumonia, unspecified organism: Secondary | ICD-10-CM | POA: Diagnosis not present

## 2015-07-21 DIAGNOSIS — R5381 Other malaise: Secondary | ICD-10-CM | POA: Diagnosis not present

## 2015-07-21 DIAGNOSIS — E785 Hyperlipidemia, unspecified: Secondary | ICD-10-CM | POA: Diagnosis not present

## 2015-07-21 DIAGNOSIS — G250 Essential tremor: Secondary | ICD-10-CM

## 2015-07-21 DIAGNOSIS — A419 Sepsis, unspecified organism: Secondary | ICD-10-CM

## 2015-07-21 DIAGNOSIS — R197 Diarrhea, unspecified: Secondary | ICD-10-CM

## 2015-07-21 DIAGNOSIS — K219 Gastro-esophageal reflux disease without esophagitis: Secondary | ICD-10-CM

## 2015-07-21 DIAGNOSIS — Z794 Long term (current) use of insulin: Secondary | ICD-10-CM

## 2015-07-22 ENCOUNTER — Encounter: Payer: Self-pay | Admitting: Internal Medicine

## 2015-07-22 ENCOUNTER — Non-Acute Institutional Stay (SKILLED_NURSING_FACILITY): Payer: Medicare Other | Admitting: Internal Medicine

## 2015-07-22 DIAGNOSIS — J189 Pneumonia, unspecified organism: Secondary | ICD-10-CM | POA: Diagnosis not present

## 2015-07-22 DIAGNOSIS — R5381 Other malaise: Secondary | ICD-10-CM | POA: Diagnosis not present

## 2015-07-22 DIAGNOSIS — F411 Generalized anxiety disorder: Secondary | ICD-10-CM

## 2015-07-22 DIAGNOSIS — E039 Hypothyroidism, unspecified: Secondary | ICD-10-CM

## 2015-07-22 DIAGNOSIS — R195 Other fecal abnormalities: Secondary | ICD-10-CM

## 2015-07-22 DIAGNOSIS — I2510 Atherosclerotic heart disease of native coronary artery without angina pectoris: Secondary | ICD-10-CM

## 2015-07-22 DIAGNOSIS — K219 Gastro-esophageal reflux disease without esophagitis: Secondary | ICD-10-CM | POA: Diagnosis not present

## 2015-07-22 DIAGNOSIS — I10 Essential (primary) hypertension: Secondary | ICD-10-CM | POA: Diagnosis not present

## 2015-07-22 DIAGNOSIS — E119 Type 2 diabetes mellitus without complications: Secondary | ICD-10-CM

## 2015-07-22 DIAGNOSIS — J45901 Unspecified asthma with (acute) exacerbation: Secondary | ICD-10-CM

## 2015-07-22 DIAGNOSIS — R600 Localized edema: Secondary | ICD-10-CM | POA: Diagnosis not present

## 2015-07-22 NOTE — Progress Notes (Signed)
Patient ID: [name], female   DOB: [birth year], 81 y.o.   MRN: [id number]     [place]  PCP: [name], MD  Code Status: Full Code   No Known Allergies  Chief Complaint  Patient presents with  . New Admit To SNF    New Admission      HPI:  81 y.o. patient is here for short term rehabilitation post hospital admission from 07/15/15-07/19/15 with community acquired pneumonia and asthma exacerbation. She was started on antibiotics and steroids. She is seen in her room today. She continues to have cough and dyspnea with exertion. Her appetite is poor. She has been having loose stools and was started on imodium which has been helpful. She has PMH of HTN, asthma, GERD, DM among others.  Review of Systems:  Constitutional: positive for easy fatigue. Negative for fever, chills, diaphoresis.  HENT: Negative for headache, congestion, nasal discharge, difficulty swallowing.   Eyes: Negative for eye pain, blurred vision, double vision and discharge.  Respiratory: Negative for wheezing.   Cardiovascular: Negative for chest pain, palpitations. Positive for leg swelling.  Gastrointestinal: Negative for heartburn, nausea, vomiting, abdominal pain. Had bowel movement this am Genitourinary: Negative for dysuria, flank pain.  Musculoskeletal: Negative for back pain, falls in the facility. Skin: Negative for itching, rash.  Neurological: Negative for dizziness, tingling, focal weakness Psychiatric/Behavioral: Negative for depression.   Past Medical History  Diagnosis Date  . Asthma   . Coronary artery disease   . Diabetes mellitus without complication ([place])   . Hypothyroidism   . GERD (gastroesophageal reflux disease)   . Insomnia   . Anxiety   . Hypertension   . CAD (coronary artery disease)   . Osteopenia   . Osteoarthritis   . H/O echocardiogram 06-26-2012    EF 55%  . H/O cardiovascular stress test 06-26-2012    compared to previous study there is no significant  change; normal myocardial perfusion study. no significant ischemia demonstrated . this is a low risk scan  . History of cardiac cath 09-30-1998    cath done by Dr. Claiborne Billings, successful rotation atherectomy of the left anterior decending artert with reduction from 80 to105  . Hx of cardiac cath 09-26-1998    [facility] cath dr. Claiborne Billings to do rotoblator at a later date  . History of Doppler ultrasound 03-30-2008    neg abd. doppler for aneurysm  . History of Holter monitoring 12-22-2007    NSR with pac occs. pvc's   Past Surgical History  Procedure Laterality Date  . Appendectomy  1947  . Dilation and curettage of uterus  1960  . Vesicovaginal fistula closure w/ tah  1971    Dr Mallie Mussel  . Bladder tack  1970    Dr Rosana Hoes  . Breast lumpectomy  1970    Left, Dr Leandrew Koyanagi  . Nasal sinus surgery  1980    Left; for chronic sinusitis  . Revision total knee arthroplasty  2005    Dr Alvan Dame Right knee   Social History:   reports that she has never smoked. She has never used smokeless tobacco. She reports that she does not drink alcohol or use illicit drugs.  Family History  Problem Relation Age of Onset  . Diabetes Father   . Parkinson's disease Father   . Heart disease Father   . Asthma Father   . Diabetes Mother   . Heart disease Mother   . Heart disease Brother   . Diabetes Brother   .  Diabetes Brother   . Diabetes Brother     Medications:   Medication List       This list is accurate as of: 07/22/15 11:59 PM.  Always use your most recent med list.               acetaminophen 500 MG tablet  Commonly known as:  TYLENOL  Take 500 mg by mouth every 6 (six) hours as needed for moderate pain or headache.     acetaminophen-codeine 120-12 MG/5ML solution  Take 10 mLs by mouth every 4 (four) hours as needed (cough).     aspirin EC 81 MG tablet  Take 81 mg by mouth at bedtime.     azelastine 0.1 % nasal spray  Commonly known as:  ASTELIN  Place 1 spray into the nose 2 (two) times daily. Use  in each nostril as directed     azithromycin 500 MG tablet  Commonly known as:  ZITHROMAX  Take 1 tablet (500 mg total) by mouth daily.     benzonatate 200 MG capsule  Commonly known as:  TESSALON  Take 1 capsule (200 mg total) by mouth 3 (three) times daily as needed for cough.     cefUROXime 250 MG tablet  Commonly known as:  CEFTIN  Take 1 tablet (250 mg total) by mouth 2 (two) times daily with a meal.     clopidogrel 75 MG tablet  Commonly known as:  PLAVIX  Take 75 mg by mouth daily.     clorazepate 15 MG tablet  Commonly known as:  TRANXENE  Take 15 mg by mouth at bedtime. anxiety     glimepiride 1 MG tablet  Commonly known as:  AMARYL  Take 1 mg by mouth daily before breakfast.     Guaifenesin 1200 MG Tb12  Take 1 tablet (1,200 mg total) by mouth 2 (two) times daily.     ibuprofen 200 MG tablet  Commonly known as:  ADVIL,MOTRIN  Take 400 mg by mouth every 6 (six) hours as needed for headache or moderate pain.     levothyroxine 50 MCG tablet  Commonly known as:  SYNTHROID, LEVOTHROID  Take 25 mcg by mouth daily.     loperamide 2 MG tablet  Commonly known as:  IMODIUM A-D  Take 2 mg by mouth 2 (two) times daily. X three days (end 07/23/15) then take as needed     LORazepam 1 MG tablet  Commonly known as:  ATIVAN  Take 1 mg by mouth every 8 (eight) hours as needed for anxiety.     MICARDIS HCT 80-25 MG tablet  Generic drug:  telmisartan-hydrochlorothiazide  Take 1 tablet by mouth daily.     montelukast 10 MG tablet  Commonly known as:  SINGULAIR  Take 10 mg by mouth at bedtime.     multivitamin with minerals Tabs tablet  Take 1 tablet by mouth daily.     nitroGLYCERIN 0.4 MG SL tablet  Commonly known as:  NITROSTAT  Place 0.4 mg under the tongue every 5 (five) minutes as needed. For chest pain     pantoprazole 40 MG tablet  Commonly known as:  PROTONIX  Take 40 mg by mouth 2 (two) times daily.     predniSONE 5 MG tablet  Commonly known as:   DELTASONE  Take 1 tablet (5 mg total) by mouth daily with breakfast.     PROAIR HFA 108 (90 BASE) MCG/ACT inhaler  Generic drug:  albuterol  Inhale 1 puff  into the lungs as needed for wheezing.     albuterol (2.5 MG/3ML) 0.083% nebulizer solution  Commonly known as:  PROVENTIL  Take 3 mLs (2.5 mg total) by nebulization every 6 (six) hours as needed for wheezing.     QUEtiapine 25 MG tablet  Commonly known as:  SEROQUEL  Take 50 mg by mouth daily with breakfast.     rosuvastatin 20 MG tablet  Commonly known as:  CRESTOR  Take 20 mg by mouth at bedtime.     topiramate 25 MG tablet  Commonly known as:  TOPAMAX  Take 25 mg by mouth at bedtime.     Umeclidinium-Vilanterol 62.5-25 MCG/INH Aepb  Commonly known as:  ANORO ELLIPTA  Inhale 1 puff into the lungs daily.         Physical Exam: Filed Vitals:   07/22/15 1207  BP: 120/69  Pulse: 90  Temp: 96.4 F (35.8 C)  TempSrc: Oral  Resp: 16  Height: 5\' 1"  (1.549 m)  Weight: 202 lb (91.627 kg)   Wt Readings from Last 3 Encounters:  07/22/15 202 lb (91.627 kg)  07/21/15 202 lb 6.1 oz (91.8 kg)  07/16/15 202 lb 6.1 oz (91.8 kg)    General- elderly female, well built, in no acute distress Head- normocephalic, atraumatic Nose- normal nasal mucosa, no maxillary or frontal sinus tenderness, no nasal discharge Throat- moist mucus membrane Eyes- PERRLA, EOMI, no pallor, no icterus, no discharge, normal conjunctiva, normal sclera Neck- no cervical lymphadenopathy, no JVD Cardiovascular- normal s1,s2, no murmurs, palpable dorsalis pedis and radial pulses, + leg edema Respiratory- bilateral poor air entry with wheezing and basilar crackles. No rhonchi, no use of accessory muscles, on o2 Abdomen- bowel sounds present, soft, non tender Musculoskeletal- able to move all 4 extremities, generalized weakness  Neurological- no focal deficit, alert and oriented to person, place and time Skin- warm and dry Psychiatry- normal mood and  affect    Labs reviewed: Basic Metabolic Panel:  Recent Labs  07/16/15 0205 07/17/15 0555 07/18/15 0450  NA 136 138 143  K 3.0* 3.6 4.0  CL 105 108 112*  CO2 21* 21* 24  GLUCOSE 284* 209* 233*  BUN 28* 28* 28*  CREATININE 1.08* 0.90 0.83  CALCIUM 8.1* 8.0* 8.3*  MG  --  2.2  --    Liver Function Tests:  Recent Labs  07/15/15 1515 07/16/15 0205  AST 26 42*  ALT 23 27  ALKPHOS 51 50  BILITOT 0.5 0.3  PROT 6.7 6.9  ALBUMIN 3.4* 3.5   No results for input(s): LIPASE, AMYLASE in the last 8760 hours. No results for input(s): AMMONIA in the last 8760 hours. CBC:  Recent Labs  06/25/15 1942 07/15/15 1515 07/15/15 1846 07/16/15 0205 07/18/15 0450 07/18/15 1210  WBC 7.8 11.9* 11.5* 7.6 7.4  --   NEUTROABS 5.6 10.1*  --   --   --   --   HGB 11.4* 11.0* 10.1* 11.2* 8.9* 9.9*  HCT 35.2* 36.1 31.6* 35.1* 29.4* 31.6*  MCV 84.2 86.4 85.6 85.0 84.7  --   PLT 219 166 157 150 180  --    Cardiac Enzymes: No results for input(s): CKTOTAL, CKMB, CKMBINDEX, TROPONINI in the last 8760 hours. BNP: Invalid input(s): POCBNP CBG:  Recent Labs  07/18/15 2055 07/19/15 0736 07/19/15 1154  GLUCAP 189* 135* 153*    Radiological Exams: Dg Chest 2 View  07/15/2015  CLINICAL DATA:  Fever, productive cough, and progressive dyspnea. EXAM: CHEST  2 VIEW COMPARISON:  06/25/2015 and 01/20/2015 FINDINGS: There is slight peribronchial thickening. No discrete infiltrates or effusions. Slight atelectasis at the right lung base. Heart size and vascularity are normal. No acute osseous abnormality. IMPRESSION: Bronchitic changes with slight right base atelectasis anteriorly. Electronically Signed   By: Lorriane Shire M.D.   On: 07/15/2015 15:43     Assessment/Plan  Physical deconditioning Will have her work with physical therapy and occupational therapy team to help with gait training and muscle strengthening exercises.fall precautions. Skin care. Encourage to be out of bed.    CAP Afebrile. Continues to have poor air entry on exam. Continue to have cough with greenish sputum. Extend course of azithromycin and ceftin until 07/27/15 for now and reassess. Monitor wbc and temp curve. Add florastor to help prevent antibiotic associated diarrhea  Asthma Recent exacerbation. As per discharge summary patient is to be on tapering dose of prednisone but patient is currently on 5 mg daily of prednisone. Will start her back on prednisone 60 mg daily x 3 days, then 40 mg daily x 3 days, 20 mg daily x 3 days, 10 mg daily x 3 days and then 5 mg daily x 3 days as per d/c summary. Wean off o2 as tolerated. Change her albuterol nebulizer to qid x 5 days, then prn. Continue acetaminophen-codeine 10 cc q4h prn cough. Continue singulair 10 mg daily and anoro.   Leg edema New per patient, start lasix 20 mg daily for now with some crackles on lung exam. Check bmp 07/25/15. Daily weight for now  Loose stools Imodium has been helpful. Add florastor with concern for antibiotic associated diarrhea x 1 week. If no improvement, will send for infection workup  CAD Remains chest pain free. Continue aspirin and plavix with prn NTG. Continue crestor 20 mg daily.   DM No recent a1c for review. Monitor cbg, continue amaryl 1 mg daily  Hypothyroidism Continue levothyroxine 25 mcg daily  GAD Continue ativan 1 mg q8h prn anxiety. Continue seroquel 50 mg daily and topamax 25 mg daily for now  HTN Stable, continue micardis 80-25 mg daily, monitor BP  gerd Continue protonix 40 mg bid   Goals of care: short term rehabilitation   Labs/tests ordered: cbc, cmp 07/25/15  Family/ staff Communication: reviewed care plan with patient and nursing supervisor    Blanchie Serve, MD  [facility] [phone] (Monday-Friday 8 am - 5 pm) [phone] (afterhours)

## 2015-07-26 ENCOUNTER — Non-Acute Institutional Stay (SKILLED_NURSING_FACILITY): Payer: Medicare Other | Admitting: Adult Health

## 2015-07-26 ENCOUNTER — Encounter: Payer: Self-pay | Admitting: Adult Health

## 2015-07-26 DIAGNOSIS — E876 Hypokalemia: Secondary | ICD-10-CM | POA: Diagnosis not present

## 2015-07-26 DIAGNOSIS — D509 Iron deficiency anemia, unspecified: Secondary | ICD-10-CM

## 2015-08-05 ENCOUNTER — Non-Acute Institutional Stay (SKILLED_NURSING_FACILITY): Payer: Medicare Other | Admitting: Adult Health

## 2015-08-05 DIAGNOSIS — E119 Type 2 diabetes mellitus without complications: Secondary | ICD-10-CM

## 2015-08-05 DIAGNOSIS — E876 Hypokalemia: Secondary | ICD-10-CM

## 2015-08-05 DIAGNOSIS — E785 Hyperlipidemia, unspecified: Secondary | ICD-10-CM | POA: Diagnosis not present

## 2015-08-05 DIAGNOSIS — J45909 Unspecified asthma, uncomplicated: Secondary | ICD-10-CM

## 2015-08-05 DIAGNOSIS — I2510 Atherosclerotic heart disease of native coronary artery without angina pectoris: Secondary | ICD-10-CM

## 2015-08-05 DIAGNOSIS — D509 Iron deficiency anemia, unspecified: Secondary | ICD-10-CM | POA: Diagnosis not present

## 2015-08-05 DIAGNOSIS — R197 Diarrhea, unspecified: Secondary | ICD-10-CM

## 2015-08-05 DIAGNOSIS — Z794 Long term (current) use of insulin: Secondary | ICD-10-CM

## 2015-08-05 DIAGNOSIS — F419 Anxiety disorder, unspecified: Secondary | ICD-10-CM

## 2015-08-05 DIAGNOSIS — K219 Gastro-esophageal reflux disease without esophagitis: Secondary | ICD-10-CM

## 2015-08-05 DIAGNOSIS — R600 Localized edema: Secondary | ICD-10-CM | POA: Diagnosis not present

## 2015-08-05 DIAGNOSIS — R5381 Other malaise: Secondary | ICD-10-CM | POA: Diagnosis not present

## 2015-08-05 DIAGNOSIS — E039 Hypothyroidism, unspecified: Secondary | ICD-10-CM

## 2015-08-05 DIAGNOSIS — I10 Essential (primary) hypertension: Secondary | ICD-10-CM | POA: Diagnosis not present

## 2015-08-07 ENCOUNTER — Encounter: Payer: Self-pay | Admitting: Adult Health

## 2015-08-07 NOTE — Progress Notes (Addendum)
Patient ID: [name], female   DOB: Jan 05, 1925, 81 y.o.   MRN: [id number]    DATE:  08/05/15  MRN:  [id number]  BIRTHDAY: 1925-06-30  Facility:  [facility]ursing Home Location:  [place] Room Number: 1001-1  LEVEL OF CARE:  SNF [phone])  Contact Information    Name [place] Son [phone]   Zannie Cove   [id number]     Chief Complaint  Patient presents with  . Discharge Note    Physical deconditioning,pneumonia, asthmahypertension hyperlipidemia, GERD, diabetes mellitus type 2, CAD, anxiety, essential tremor, diarrhea and hypothyroidism    HISTORY OF PRESENT ILLNESS:  This is an 81 year old female who is for discharge home with Home health PT, OT and Nursing. DME:  3-in-1 bedside commode.  She has been admitted to [facility] on 07/19/15 from [facility]. She has PMH of asthma, diabetes mellitus type 2, CAD and hypertension.she was treated for CAP with sepsis. She was given azithromycin with Rocephin.She was transitioned to azithromycin and Ceftin upon discharge.  Patient was admitted to this facility for short-term rehabilitation after the patient's recent hospitalization.  Patient has completed SNF rehabilitation and therapy has cleared the patient for discharge.   PAST MEDICAL HISTORY:  Past Medical History  Diagnosis Date  . Asthma   . Coronary artery disease   . Diabetes mellitus without complication ([place])   . Hypothyroidism   . GERD (gastroesophageal reflux disease)   . Insomnia   . Anxiety   . Hypertension   . CAD (coronary artery disease)   . Osteopenia   . Osteoarthritis   . H/O echocardiogram 06-26-2012    EF 55%  . H/O cardiovascular stress test 06-26-2012    compared to previous study there is no significant change; normal myocardial perfusion study. no significant ischemia demonstrated . this is a low risk scan  . History of cardiac cath 09-30-1998      cath done by Dr. Claiborne Billings, successful rotation atherectomy of the left anterior decending artert with reduction from 80 to105  . Hx of cardiac cath 09-26-1998    [facility] cath dr. Claiborne Billings to do rotoblator at a later date  . History of Doppler ultrasound 03-30-2008    neg abd. doppler for aneurysm  . History of Holter monitoring 12-22-2007    NSR with pac occs. pvc's     CURRENT MEDICATIONS: Reviewed    Medication List       This list is accurate as of: 08/05/15 11:59 PM.  Always use your most recent med list.               acetaminophen 500 MG tablet  Commonly known as:  TYLENOL  Take 500 mg by mouth every 6 (six) hours as needed for moderate pain or headache.     acetaminophen-codeine 120-12 MG/5ML solution  Take 10 mLs by mouth every 4 (four) hours as needed (cough).     aspirin EC 81 MG tablet  Take 81 mg by mouth at bedtime.     azelastine 0.1 % nasal spray  Commonly known as:  ASTELIN  Place 1 spray into the nose 2 (two) times daily. Use in each nostril as directed     benzonatate 200 MG capsule  Commonly known as:  TESSALON  Take 1 capsule (200 mg total) by mouth 3 (three) times daily as needed for cough.     clopidogrel 75 MG tablet  Commonly known as:  PLAVIX  Take 75 mg by mouth daily.     clorazepate 15 MG tablet  Commonly known as:  TRANXENE  Take 15 mg by mouth at bedtime. anxiety     ferrous sulfate 325 (65 FE) MG EC tablet  Take 325 mg by mouth daily.     furosemide 40 MG tablet  Commonly known as:  LASIX  Take 40 mg by mouth daily.     glimepiride 1 MG tablet  Commonly known as:  AMARYL  Take 1 mg by mouth daily before breakfast.     Guaifenesin 1200 MG Tb12  Take 1,200 mg by mouth 2 (two) times daily.     ibuprofen 200 MG tablet  Commonly known as:  ADVIL,MOTRIN  Take 400 mg by mouth every 6 (six) hours as needed for headache or moderate pain.     levothyroxine 50 MCG tablet  Commonly known as:  SYNTHROID, LEVOTHROID  Take 25 mcg by mouth  daily.     loperamide 2 MG tablet  Commonly known as:  IMODIUM A-D  Take 2 mg by mouth as needed.     LORazepam 1 MG tablet  Commonly known as:  ATIVAN  Take 1 mg by mouth every 8 (eight) hours as needed for anxiety.     losartan-hydrochlorothiazide 100-25 MG tablet  Commonly known as:  HYZAAR  Take 1 tablet by mouth daily.     montelukast 10 MG tablet  Commonly known as:  SINGULAIR  Take 10 mg by mouth at bedtime.     multivitamin with minerals Tabs tablet  Take 1 tablet by mouth daily.     nitroGLYCERIN 0.4 MG SL tablet  Commonly known as:  NITROSTAT  Place 0.4 mg under the tongue every 5 (five) minutes as needed. For chest pain     pantoprazole 40 MG tablet  Commonly known as:  PROTONIX  Take 40 mg by mouth 2 (two) times daily.     potassium chloride 10 MEQ tablet  Commonly known as:  K-DUR  Take 10 mEq by mouth daily. Take 3 capsules= 30 meq PO daily     predniSONE 5 MG tablet  Commonly known as:  DELTASONE  Take 1 tablet (5 mg total) by mouth daily with breakfast.     PROAIR HFA 108 (90 BASE) MCG/ACT inhaler  Generic drug:  albuterol  Inhale 1 puff into the lungs as needed for wheezing.     albuterol (2.5 MG/3ML) 0.083% nebulizer solution  Commonly known as:  PROVENTIL  Take 3 mLs (2.5 mg total) by nebulization every 6 (six) hours as needed for wheezing.     QUEtiapine 25 MG tablet  Commonly known as:  SEROQUEL  Take 50 mg by mouth daily with breakfast.     rosuvastatin 20 MG tablet  Commonly known as:  CRESTOR  Take 20 mg by mouth at bedtime.     topiramate 25 MG tablet  Commonly known as:  TOPAMAX  Take 25 mg by mouth at bedtime.     Umeclidinium-Vilanterol 62.5-25 MCG/INH Aepb  Commonly known as:  ANORO ELLIPTA  Inhale 1 puff into the lungs daily.          No Known Allergies   REVIEW OF SYSTEMS:  GENERAL: no change in appetite, no fatigue, no weight changes, no fever, chills or weakness EYES: Denies change in vision, dry eyes, eye pain,  itching or discharge EARS: Denies change in hearing, ringing in ears, or earache NOSE: Denies nasal congestion or epistaxis MOUTH and  THROAT: Denies oral discomfort, gingival pain or bleeding, pain from teeth or hoarseness   RESPIRATORY: no wheezing, hemoptysis CARDIAC: no chest pain, or palpitations GI: no abdominal pain, constipation, heart burn, nausea or vomiting, diarrhea GU: Denies dysuria, frequency, hematuria, incontinence, or discharge PSYCHIATRIC: Denies feeling of depression or anxiety. No report of hallucinations, insomnia, paranoia, or agitation   PHYSICAL EXAMINATION  GENERAL APPEARANCE: Well nourished. In no acute distress. Normal body habitus HEAD: Normal in size and contour. No evidence of trauma EYES: Lids open and close normally. No blepharitis, entropion or ectropion. PERRL. Conjunctivae are clear and sclerae are white. Lenses are without opacity EARS: Pinnae are normal. Patient hears normal voice tunes of the examiner MOUTH and THROAT: Lips are without lesions. Oral mucosa is moist and without lesions. Tongue is normal in shape, size, and color and without lesions NECK: supple, trachea midline, no neck masses, no thyroid tenderness, no thyromegaly LYMPHATICS: no LAN in the neck, no supraclavicular LAN RESPIRATORY: breathing is even & unlabored, BS CTAB CARDIAC: RRR, no murmur,no extra heart sounds, BLE edema 1+ GI: abdomen soft, normal BS, no masses, no tenderness, no hepatomegaly, no splenomegaly EXTREMITIES:  Able to move X 4 extremities PSYCHIATRIC: Alert and oriented X 3. Affect and behavior are appropriate  LABS/RADIOLOGY: Labs reviewed: 08/01/15  Sodium 141 potassium 2.8 glucose 80  BUN 56 creatinine 1.18 calcium 9.0 07/25/15  Sodium 144 potassium 3.2 glucose 103BUN 21 creatinine 0.93 calcium 8.7 WBC 6.5 hemoglobin 9.5 platelet 238 07/20/15  WBC 8.3 hemoglobin 10.8 hematocrit 35.3 MCV 84.7 platelet 259 sodium 144 potassium 3. Basic Metabolic Panel:  Recent  Labs  07/16/15 0205 07/17/15 0555 07/18/15 0450  NA 136 138 143  K 3.0* 3.6 4.0  CL 105 108 112*  CO2 21* 21* 24  GLUCOSE 284* 209* 233*  BUN 28* 28* 28*  CREATININE 1.08* 0.90 0.83  CALCIUM 8.1* 8.0* 8.3*  MG  --  2.2  --    Liver Function Tests:  Recent Labs  07/15/15 1515 07/16/15 0205  AST 26 42*  ALT 23 27  ALKPHOS 51 50  BILITOT 0.5 0.3  PROT 6.7 6.9  ALBUMIN 3.4* 3.5   CBC:  Recent Labs  06/25/15 1942 07/15/15 1515 07/15/15 1846 07/16/15 0205 07/18/15 0450 07/18/15 1210  WBC 7.8 11.9* 11.5* 7.6 7.4  --   NEUTROABS 5.6 10.1*  --   --   --   --   HGB 11.4* 11.0* 10.1* 11.2* 8.9* 9.9*  HCT 35.2* 36.1 31.6* 35.1* 29.4* 31.6*  MCV 84.2 86.4 85.6 85.0 84.7  --   PLT 219 166 157 150 180  --    CBG:  Recent Labs  07/18/15 2055 07/19/15 0736 07/19/15 1154  GLUCAP 189* 135* 153*      Dg Chest 2 View  07/15/2015  CLINICAL DATA:  Fever, productive cough, and progressive dyspnea. EXAM: CHEST  2 VIEW COMPARISON:  06/25/2015 and 01/20/2015 FINDINGS: There is slight peribronchial thickening. No discrete infiltrates or effusions. Slight atelectasis at the right lung base. Heart size and vascularity are normal. No acute osseous abnormality. IMPRESSION: Bronchitic changes with slight right base atelectasis anteriorly. Electronically Signed   By: Lorriane Shire M.D.   On: 07/15/2015 15:43    ASSESSMENT/PLAN:  Physical decondioning - for Home health PT, OT and Nursing  Community acquired pneumonia - resolved  Hypertension - continue Losartan HCT 100-25 mg 1 tab PO daily  Hyperlipidemia - continue Resovastatin 20 mg 1 tab Po daily   Asthma -  Continue albuterol nebs PRN and Singulair 10 mg daily  Diabetes mellitus, type 2 - continue Glimepiride 1 mg daily   CAD - stable; continue Plavix 75 mg daily and ASA 81 mg EC daily  Anxiety - mood is stable; continue Ativan PRN, Seroquel 50 mg daily and Clorazepate 15 mg @ HS  Essential Tremor - continue Topamax  25 mg 1 tab PO Q HS  GERD - continue Protonix 40 mg BID  Hypothyroidism - continue Synthroid 25 mg daily  Diarrhea -  continue Loperamide 2 mg 1 tab  PO BID  PRN  Hypokalemia - continue KCL 10 meq take 3 capsules = 30 meq PO daily; for repeat BMP  Bilateral lower extremity edema - continue Lasix 40 mg PO daily    I have filled out patient's discharge paperwork and written prescriptions.  Patient will receive home health PT, OT and Nursing.  DME provided:  3-in 1 bedside commode  Total discharge time: Greater than 30 minutes  Discharge time involved coordination of the discharge process with social worker, nursing staff and therapy department. Medical justification for home health services/DME verified.     [facility] [place], NP [employer] [phone]

## 2015-08-07 NOTE — Progress Notes (Signed)
Patient ID: DYLIN KNOST, female   DOB: 16-Oct-1924, 81 y.o.   MRN: [id number]    DATE:  07/26/15  MRN:  [id number]  BIRTHDAY: 16-Aug-1925  Facility:  Nursing Home Location:  [place] Room Number: 1101-2  LEVEL OF CARE:  SNF [phone])  Contact Information    Name Ardmore Son [phone]   Zannie Cove   [id number]     Chief Complaint  Patient presents with  . Acute Visit    Hypokalemia and Anemia    HISTORY OF PRESENT ILLNESS:  This is an 81 year old female who has been noted to have K 3.2, low and hgb 9.5. No complaints of fatigue nor muscle cramps. She was recently started on Lasix for BLE edema.  She has been admitted to [facility] on 07/19/15 from [facility] She has PMH of asthma, diabetes mellitus type 2, CAD and hypertension.she was treated for CAP with sepsis. She was given azithromycin with Rocephin.She was transitioned to azithromycin and Ceftin upon discharge.  She has been admitted for a short-term rehabilitation.  PAST MEDICAL HISTORY:  Past Medical History  Diagnosis Date  . Asthma   . Coronary artery disease   . Diabetes mellitus without complication ([place])   . Hypothyroidism   . GERD (gastroesophageal reflux disease)   . Insomnia   . Anxiety   . Hypertension   . CAD (coronary artery disease)   . Osteopenia   . Osteoarthritis   . H/O echocardiogram 06-26-2012    EF 55%  . H/O cardiovascular stress test 06-26-2012    compared to previous study there is no significant change; normal myocardial perfusion study. no significant ischemia demonstrated . this is a low risk scan  . History of cardiac cath 09-30-1998    cath done by Dr. Claiborne Billings, successful rotation atherectomy of the left anterior decending artert with reduction from 80 to105  . Hx of cardiac cath 09-26-1998    dign[facility] cath dr. Claiborne Billings to do rotoblator at a later date  . History of Doppler  ultrasound 03-30-2008    neg abd. doppler for aneurysm  . History of Holter monitoring 12-22-2007    NSR with pac occs. pvc's     CURRENT MEDICATIONS: Reviewed  Patient's Medications  New Prescriptions   No medications on file  Previous Medications   ACETAMINOPHEN (TYLENOL) 500 MG TABLET    Take 500 mg by mouth every 6 (six) hours as needed for moderate pain or headache.   ACETAMINOPHEN-CODEINE 120-12 MG/5ML SOLUTION    Take 10 mLs by mouth every 4 (four) hours as needed (cough).   ALBUTEROL (PROVENTIL) (2.5 MG/3ML) 0.083% NEBULIZER SOLUTION    Take 3 mLs (2.5 mg total) by nebulization every 6 (six) hours as needed for wheezing.   ASPIRIN EC 81 MG TABLET    Take 81 mg by mouth at bedtime.    AZELASTINE (ASTELIN) 137 MCG/SPRAY NASAL SPRAY    Place 1 spray into the nose 2 (two) times daily. Use in each nostril as directed   AZITHROMYCIN (ZITHROMAX) 500 MG TABLET    Take 1 tablet (500 mg total) by mouth daily.   BENZONATATE (TESSALON) 200 MG CAPSULE    Take 1 capsule (200 mg total) by mouth 3 (three) times daily as needed for cough.   CEFUROXIME (CEFTIN) 250 MG TABLET    Take 1 tablet (250 mg total) by mouth 2 (two) times daily with a meal.  CLOPIDOGREL (PLAVIX) 75 MG TABLET    Take 75 mg by mouth daily.   CLORAZEPATE (TRANXENE) 15 MG TABLET    Take 15 mg by mouth at bedtime. anxiety   GLIMEPIRIDE (AMARYL) 1 MG TABLET    Take 1 mg by mouth daily before breakfast.   GUAIFENESIN 1200 MG TB12    Take 1 tablet (1,200 mg total) by mouth 2 (two) times daily.   IBUPROFEN (ADVIL,MOTRIN) 200 MG TABLET    Take 400 mg by mouth every 6 (six) hours as needed for headache or moderate pain.   LEVOTHYROXINE (SYNTHROID, LEVOTHROID) 50 MCG TABLET    Take 25 mcg by mouth daily.    LOPERAMIDE (IMODIUM A-D) 2 MG TABLET    Take 2 mg by mouth 2 (two) times daily. X three days (end 07/23/15) then take as needed   LORAZEPAM (ATIVAN) 1 MG TABLET    Take 1 mg by mouth every 8 (eight) hours as needed for anxiety.    MICARDIS HCT 80-25 MG PER TABLET    Take 1 tablet by mouth daily.   MONTELUKAST (SINGULAIR) 10 MG TABLET    Take 10 mg by mouth at bedtime.   MULTIPLE VITAMIN (MULTIVITAMIN WITH MINERALS) TABS TABLET    Take 1 tablet by mouth daily.   NITROGLYCERIN (NITROSTAT) 0.4 MG SL TABLET    Place 0.4 mg under the tongue every 5 (five) minutes as needed. For chest pain   PANTOPRAZOLE (PROTONIX) 40 MG TABLET    Take 40 mg by mouth 2 (two) times daily.   PREDNISONE (DELTASONE) 5 MG TABLET    Take 1 tablet (5 mg total) by mouth daily with breakfast.   PROAIR HFA 108 (90 BASE) MCG/ACT INHALER    Inhale 1 puff into the lungs as needed for wheezing.    QUETIAPINE (SEROQUEL) 25 MG TABLET    Take 50 mg by mouth daily with breakfast.   ROSUVASTATIN (CRESTOR) 20 MG TABLET    Take 20 mg by mouth at bedtime.    TOPIRAMATE (TOPAMAX) 25 MG TABLET    Take 25 mg by mouth at bedtime.   UMECLIDINIUM-VILANTEROL (ANORO ELLIPTA) 62.5-25 MCG/INH AEPB    Inhale 1 puff into the lungs daily.  Modified Medications   No medications on file  Discontinued Medications   No medications on file     No Known Allergies   REVIEW OF SYSTEMS:  GENERAL: no change in appetite, no fatigue, no weight changes, no fever, chills or weakness EYES: Denies change in vision, dry eyes, eye pain, itching or discharge EARS: Denies change in hearing, ringing in ears, or earache NOSE: Denies nasal congestion or epistaxis MOUTH and THROAT: Denies oral discomfort, gingival pain or bleeding, pain from teeth or hoarseness   RESPIRATORY: no wheezing, hemoptysis CARDIAC: no chest pain, or palpitations GI: no abdominal pain, constipation, heart burn, nausea or vomiting, diarrhea GU: Denies dysuria, frequency, hematuria, incontinence, or discharge PSYCHIATRIC: Denies feeling of depression or anxiety. No report of hallucinations, insomnia, paranoia, or agitation   PHYSICAL EXAMINATION  GENERAL APPEARANCE: Well nourished. In no acute distress. Normal  body habitus HEAD: Normal in size and contour. No evidence of trauma EYES: Lids open and close normally. No blepharitis, entropion or ectropion. PERRL. Conjunctivae are clear and sclerae are white. Lenses are without opacity EARS: Pinnae are normal. Patient hears normal voice tunes of the examiner MOUTH and THROAT: Lips are without lesions. Oral mucosa is moist and without lesions. Tongue is normal in shape, size, and color and  without lesions NECK: supple, trachea midline, no neck masses, no thyroid tenderness, no thyromegaly LYMPHATICS: no LAN in the neck, no supraclavicular LAN RESPIRATORY: breathing is even & unlabored, BS CTAB CARDIAC: RRR, no murmur,no extra heart sounds, BLE edema 2+ GI: abdomen soft, normal BS, no masses, no tenderness, no hepatomegaly, no splenomegaly EXTREMITIES:  Able to move X 4 extremities PSYCHIATRIC: Alert and oriented X 3. Affect and behavior are appropriate  LABS/RADIOLOGY: Labs reviewed: 07/25/15  Sodium 144 potassium 3.2 glucose 103BUN 21 creatinine 0.93 calcium 8.7 WBC 6.5 hemoglobin 9.5 platelet [id number] Basic Metabolic Panel:  Recent Labs  07/16/15 0205 07/17/15 0555 07/18/15 0450  NA 136 138 143  K 3.0* 3.6 4.0  CL 105 108 112*  CO2 21* 21* 24  GLUCOSE 284* 209* 233*  BUN 28* 28* 28*  CREATININE 1.08* 0.90 0.83  CALCIUM 8.1* 8.0* 8.3*  MG  --  2.2  --    Liver Function Tests:  Recent Labs  07/15/15 1515 07/16/15 0205  AST 26 42*  ALT 23 27  ALKPHOS 51 50  BILITOT 0.5 0.3  PROT 6.7 6.9  ALBUMIN 3.4* 3.5   CBC:  Recent Labs  06/25/15 1942 07/15/15 1515 07/15/15 1846 07/16/15 0205 07/18/15 0450 07/18/15 1210  WBC 7.8 11.9* 11.5* 7.6 7.4  --   NEUTROABS 5.6 10.1*  --   --   --   --   HGB 11.4* 11.0* 10.1* 11.2* 8.9* 9.9*  HCT 35.2* 36.1 31.6* 35.1* 29.4* 31.6*  MCV 84.2 86.4 85.6 85.0 84.7  --   PLT 219 166 157 150 180  --    CBG:  Recent Labs  07/18/15 2055 07/19/15 0736 07/19/15 1154  GLUCAP 189* 135* 153*       Dg Chest 2 View  07/15/2015  CLINICAL DATA:  Fever, productive cough, and progressive dyspnea. EXAM: CHEST  2 VIEW COMPARISON:  06/25/2015 and 01/20/2015 FINDINGS: There is slight peribronchial thickening. No discrete infiltrates or effusions. Slight atelectasis at the right lung base. Heart size and vascularity are normal. No acute osseous abnormality. IMPRESSION: Bronchitic changes with slight right base atelectasis anteriorly. Electronically Signed   By: Lorriane Shire M.D.   On: 07/15/2015 15:43    ASSESSMENT/PLAN:  Anemia of iron deficiency - hgb 9.5; start FeSO4 325 mg 1 tab PO daily; CBC in 1 week  Hypokalemia - start KCL 20 meq 1 tab PO daily and BMP in 1 week    [facility], NP [place]

## 2015-08-07 NOTE — Addendum Note (Signed)
Addended by: Durenda Age C on: 08/07/2015 10:22 PM   Modules accepted: Medications

## 2015-08-07 NOTE — Progress Notes (Signed)
Patient ID: [name], female   DOB: Mar 07, 1925, 81 y.o.   MRN: [id number]    DATE:  08/07/2015   MRN:  [id number]  BIRTHDAY: 20-Oct-1924  Facility:  Nursing Home Location:  [place] Room Number: 1101-2  LEVEL OF CARE:  SNF [phone])  Contact Information    Name Jerome Son [phone]   Zannie Cove   [id number]     Chief Complaint  Patient presents with  . Hospitalization Follow-up    Physical deconditioning,pneumonia, asthmahypertension hyperlipidemia, GERD, diabetes mellitus type 2, CAD, anxiety, essential tremor, diarrhea and hypothyroidism    HISTORY OF PRESENT ILLNESS:  This is an 81 year old female who has been admitted to [facility] on 07/19/15 from [facility]. She has PMH of asthma, diabetes mellitus type 2, CAD and hypertension.she was treated for CAP with sepsis. She was given azithromycin with Rocephin.She was transitioned to azithromycin and Ceftin and will continue 3 more days upon discharge.  She has been admitted for a short-term rehabilitation.  PAST MEDICAL HISTORY:  Past Medical History  Diagnosis Date  . Asthma   . Coronary artery disease   . Diabetes mellitus without complication ([place])   . Hypothyroidism   . GERD (gastroesophageal reflux disease)   . Insomnia   . Anxiety   . Hypertension   . CAD (coronary artery disease)   . Osteopenia   . Osteoarthritis   . H/O echocardiogram 06-26-2012    EF 55%  . H/O cardiovascular stress test 06-26-2012    compared to previous study there is no significant change; normal myocardial perfusion study. no significant ischemia demonstrated . this is a low risk scan  . History of cardiac cath 09-30-1998    cath done by Dr. Claiborne Billings, successful rotation atherectomy of the left anterior decending artert with reduction from 80 to105  . Hx of cardiac cath 09-26-1998    d[facility] cath dr. Claiborne Billings to do rotoblator  at a later date  . History of Doppler ultrasound 03-30-2008    neg abd. doppler for aneurysm  . History of Holter monitoring 12-22-2007    NSR with pac occs. pvc's     CURRENT MEDICATIONS: Reviewed  Patient's Medications  New Prescriptions   No medications on file  Previous Medications   ACETAMINOPHEN (TYLENOL) 500 MG TABLET    Take 500 mg by mouth every 6 (six) hours as needed for moderate pain or headache.   ACETAMINOPHEN-CODEINE 120-12 MG/5ML SOLUTION    Take 10 mLs by mouth every 4 (four) hours as needed (cough).   ALBUTEROL (PROVENTIL) (2.5 MG/3ML) 0.083% NEBULIZER SOLUTION    Take 3 mLs (2.5 mg total) by nebulization every 6 (six) hours as needed for wheezing.   ASPIRIN EC 81 MG TABLET    Take 81 mg by mouth at bedtime.    AZELASTINE (ASTELIN) 137 MCG/SPRAY NASAL SPRAY    Place 1 spray into the nose 2 (two) times daily. Use in each nostril as directed   AZITHROMYCIN (ZITHROMAX) 500 MG TABLET    Take 1 tablet (500 mg total) by mouth daily.   BENZONATATE (TESSALON) 200 MG CAPSULE    Take 1 capsule (200 mg total) by mouth 3 (three) times daily as needed for cough.   CEFUROXIME (CEFTIN) 250 MG TABLET    Take 1 tablet (250 mg total) by mouth 2 (two) times daily with a meal.   CLOPIDOGREL (PLAVIX) 75 MG TABLET  Take 75 mg by mouth daily.   CLORAZEPATE (TRANXENE) 15 MG TABLET    Take 15 mg by mouth at bedtime. anxiety   GLIMEPIRIDE (AMARYL) 1 MG TABLET    Take 1 mg by mouth daily before breakfast.   GUAIFENESIN 1200 MG TB12    Take 1 tablet (1,200 mg total) by mouth 2 (two) times daily.   IBUPROFEN (ADVIL,MOTRIN) 200 MG TABLET    Take 400 mg by mouth every 6 (six) hours as needed for headache or moderate pain.   LEVOTHYROXINE (SYNTHROID, LEVOTHROID) 50 MCG TABLET    Take 25 mcg by mouth daily.    LOPERAMIDE (IMODIUM A-D) 2 MG TABLET    Take 2 mg by mouth 2 (two) times daily. X three days (end 07/23/15) then take as needed   LORAZEPAM (ATIVAN) 1 MG TABLET    Take 1 mg by mouth every 8 (eight)  hours as needed for anxiety.   MICARDIS HCT 80-25 MG PER TABLET    Take 1 tablet by mouth daily.   MONTELUKAST (SINGULAIR) 10 MG TABLET    Take 10 mg by mouth at bedtime.   MULTIPLE VITAMIN (MULTIVITAMIN WITH MINERALS) TABS TABLET    Take 1 tablet by mouth daily.   NITROGLYCERIN (NITROSTAT) 0.4 MG SL TABLET    Place 0.4 mg under the tongue every 5 (five) minutes as needed. For chest pain   PANTOPRAZOLE (PROTONIX) 40 MG TABLET    Take 40 mg by mouth 2 (two) times daily.   PREDNISONE (DELTASONE) 5 MG TABLET    Take 1 tablet (5 mg total) by mouth daily with breakfast.   PROAIR HFA 108 (90 BASE) MCG/ACT INHALER    Inhale 1 puff into the lungs as needed for wheezing.    QUETIAPINE (SEROQUEL) 25 MG TABLET    Take 50 mg by mouth daily with breakfast.   ROSUVASTATIN (CRESTOR) 20 MG TABLET    Take 20 mg by mouth at bedtime.    TOPIRAMATE (TOPAMAX) 25 MG TABLET    Take 25 mg by mouth at bedtime.   UMECLIDINIUM-VILANTEROL (ANORO ELLIPTA) 62.5-25 MCG/INH AEPB    Inhale 1 puff into the lungs daily.  Modified Medications   No medications on file  Discontinued Medications   EZETIMIBE (ZETIA) 10 MG TABLET    Take 10 mg by mouth at bedtime.     No Known Allergies   REVIEW OF SYSTEMS:  GENERAL: no change in appetite, no fatigue, no weight changes, no fever, chills or weakness EYES: Denies change in vision, dry eyes, eye pain, itching or discharge EARS: Denies change in hearing, ringing in ears, or earache NOSE: Denies nasal congestion or epistaxis MOUTH and THROAT: Denies oral discomfort, gingival pain or bleeding, pain from teeth or hoarseness   RESPIRATORY: no wheezing, hemoptysis CARDIAC: no chest pain, or palpitations GI: no abdominal pain, constipation, heart burn, nausea or vomiting, diarrhea GU: Denies dysuria, frequency, hematuria, incontinence, or discharge PSYCHIATRIC: Denies feeling of depression or anxiety. No report of hallucinations, insomnia, paranoia, or agitation   PHYSICAL  EXAMINATION  GENERAL APPEARANCE: Well nourished. In no acute distress. Normal body habitus HEAD: Normal in size and contour. No evidence of trauma EYES: Lids open and close normally. No blepharitis, entropion or ectropion. PERRL. Conjunctivae are clear and sclerae are white. Lenses are without opacity EARS: Pinnae are normal. Patient hears normal voice tunes of the examiner MOUTH and THROAT: Lips are without lesions. Oral mucosa is moist and without lesions. Tongue is normal in shape, size,  and color and without lesions NECK: supple, trachea midline, no neck masses, no thyroid tenderness, no thyromegaly LYMPHATICS: no LAN in the neck, no supraclavicular LAN RESPIRATORY: breathing is even & unlabored, BS CTAB CARDIAC: RRR, no murmur,no extra heart sounds, BLE edema 2+ GI: abdomen soft, normal BS, no masses, no tenderness, no hepatomegaly, no splenomegaly EXTREMITIES:  Able to move X 4 extremities PSYCHIATRIC: Alert and oriented X 3. Affect and behavior are appropriate  LABS/RADIOLOGY: Labs reviewed: Basic Metabolic Panel:  Recent Labs  07/16/15 0205 07/17/15 0555 07/18/15 0450  NA 136 138 143  K 3.0* 3.6 4.0  CL 105 108 112*  CO2 21* 21* 24  GLUCOSE 284* 209* 233*  BUN 28* 28* 28*  CREATININE 1.08* 0.90 0.83  CALCIUM 8.1* 8.0* 8.3*  MG  --  2.2  --    Liver Function Tests:  Recent Labs  07/15/15 1515 07/16/15 0205  AST 26 42*  ALT 23 27  ALKPHOS 51 50  BILITOT 0.5 0.3  PROT 6.7 6.9  ALBUMIN 3.4* 3.5   CBC:  Recent Labs  06/25/15 1942 07/15/15 1515 07/15/15 1846 07/16/15 0205 07/18/15 0450 07/18/15 1210  WBC 7.8 11.9* 11.5* 7.6 7.4  --   NEUTROABS 5.6 10.1*  --   --   --   --   HGB 11.4* 11.0* 10.1* 11.2* 8.9* 9.9*  HCT 35.2* 36.1 31.6* 35.1* 29.4* 31.6*  MCV 84.2 86.4 85.6 85.0 84.7  --   PLT 219 166 157 150 180  --    CBG:  Recent Labs  07/18/15 2055 07/19/15 0736 07/19/15 1154  GLUCAP 189* 135* 153*      Dg Chest 2 View  07/15/2015   CLINICAL DATA:  Fever, productive cough, and progressive dyspnea. EXAM: CHEST  2 VIEW COMPARISON:  06/25/2015 and 01/20/2015 FINDINGS: There is slight peribronchial thickening. No discrete infiltrates or effusions. Slight atelectasis at the right lung base. Heart size and vascularity are normal. No acute osseous abnormality. IMPRESSION: Bronchitic changes with slight right base atelectasis anteriorly. Electronically Signed   By: Lorriane Shire M.D.   On: 07/15/2015 15:43    ASSESSMENT/PLAN:  Physical decondioning - for rehabilitation  Community acquired pneumonia - continue azithromycin 500 mg 1 tab mouth daily and Cefuroxime250 mg 1 tab by mouth twice daily 3 days and Tessalon Perle PRN; may wean off O2 as tolerated  Hypertension - continue Micardis HCT 18-25 mg 1 tab PO daily; BP/HR BID X 1 week  Hyperlipidemia - ontinue Resovastatin 20 mg 1 tab Po daily and discontinue Zetia  Asthma -  Continue albuterol nebs PRN, Prednisone 5 mg daily and Singulair 10 mg daily  Diabetes mellitus, type 2 - continue Glimepiride 1 mg daily and SSI coverage  CAD - stable; continue Plavix 75 mg daily and ASA 81 mg EC daily  Anxiety - mood is stable; continue Ativan PRN, Seroquel 50 mg daily and Clorazepate 15 mg @ HS  Essential Tremor - continue Topamax 25 mg 1 tab PO Q HS  GERD - continue rotonix 40 mg BID  Hypothyroidism - continue Synthroid 25 mg daily  Diarrhea -  as a side effect of antibiotics; start Loperamide 2 mg 1 tab  PO BID X 3 days then PRN    Goals of care:  Short-term rehabilitation    [facility], [facility], [facility], NP [place] [phone]

## 2015-08-10 ENCOUNTER — Ambulatory Visit: Payer: Medicare Other | Admitting: Cardiovascular Disease

## 2015-08-11 DIAGNOSIS — R600 Localized edema: Secondary | ICD-10-CM | POA: Diagnosis not present

## 2015-08-11 DIAGNOSIS — W19XXXD Unspecified fall, subsequent encounter: Secondary | ICD-10-CM | POA: Diagnosis not present

## 2015-08-11 DIAGNOSIS — E119 Type 2 diabetes mellitus without complications: Secondary | ICD-10-CM | POA: Diagnosis not present

## 2015-08-11 DIAGNOSIS — R5381 Other malaise: Secondary | ICD-10-CM | POA: Diagnosis not present

## 2015-08-11 DIAGNOSIS — Z8701 Personal history of pneumonia (recurrent): Secondary | ICD-10-CM | POA: Diagnosis not present

## 2015-08-11 DIAGNOSIS — D649 Anemia, unspecified: Secondary | ICD-10-CM | POA: Diagnosis not present

## 2015-08-11 DIAGNOSIS — K219 Gastro-esophageal reflux disease without esophagitis: Secondary | ICD-10-CM | POA: Diagnosis not present

## 2015-08-11 DIAGNOSIS — Z9181 History of falling: Secondary | ICD-10-CM | POA: Diagnosis not present

## 2015-08-17 DIAGNOSIS — E119 Type 2 diabetes mellitus without complications: Secondary | ICD-10-CM | POA: Diagnosis not present

## 2015-08-17 DIAGNOSIS — R600 Localized edema: Secondary | ICD-10-CM | POA: Diagnosis not present

## 2015-08-17 DIAGNOSIS — Z9181 History of falling: Secondary | ICD-10-CM | POA: Diagnosis not present

## 2015-08-17 DIAGNOSIS — Z8701 Personal history of pneumonia (recurrent): Secondary | ICD-10-CM | POA: Diagnosis not present

## 2015-08-17 DIAGNOSIS — R5381 Other malaise: Secondary | ICD-10-CM | POA: Diagnosis not present

## 2015-08-17 DIAGNOSIS — W19XXXD Unspecified fall, subsequent encounter: Secondary | ICD-10-CM | POA: Diagnosis not present

## 2015-08-19 DIAGNOSIS — R5381 Other malaise: Secondary | ICD-10-CM | POA: Diagnosis not present

## 2015-08-19 DIAGNOSIS — W19XXXD Unspecified fall, subsequent encounter: Secondary | ICD-10-CM | POA: Diagnosis not present

## 2015-08-19 DIAGNOSIS — E119 Type 2 diabetes mellitus without complications: Secondary | ICD-10-CM | POA: Diagnosis not present

## 2015-08-19 DIAGNOSIS — Z8701 Personal history of pneumonia (recurrent): Secondary | ICD-10-CM | POA: Diagnosis not present

## 2015-08-19 DIAGNOSIS — Z9181 History of falling: Secondary | ICD-10-CM | POA: Diagnosis not present

## 2015-08-19 DIAGNOSIS — R600 Localized edema: Secondary | ICD-10-CM | POA: Diagnosis not present

## 2015-08-22 DIAGNOSIS — R5381 Other malaise: Secondary | ICD-10-CM | POA: Diagnosis not present

## 2015-08-22 DIAGNOSIS — W19XXXD Unspecified fall, subsequent encounter: Secondary | ICD-10-CM | POA: Diagnosis not present

## 2015-08-22 DIAGNOSIS — Z9181 History of falling: Secondary | ICD-10-CM | POA: Diagnosis not present

## 2015-08-22 DIAGNOSIS — Z8701 Personal history of pneumonia (recurrent): Secondary | ICD-10-CM | POA: Diagnosis not present

## 2015-08-22 DIAGNOSIS — E119 Type 2 diabetes mellitus without complications: Secondary | ICD-10-CM | POA: Diagnosis not present

## 2015-08-22 DIAGNOSIS — R600 Localized edema: Secondary | ICD-10-CM | POA: Diagnosis not present

## 2015-08-23 DIAGNOSIS — R600 Localized edema: Secondary | ICD-10-CM | POA: Diagnosis not present

## 2015-08-23 DIAGNOSIS — R5381 Other malaise: Secondary | ICD-10-CM | POA: Diagnosis not present

## 2015-08-23 DIAGNOSIS — Z8701 Personal history of pneumonia (recurrent): Secondary | ICD-10-CM | POA: Diagnosis not present

## 2015-08-23 DIAGNOSIS — Z9181 History of falling: Secondary | ICD-10-CM | POA: Diagnosis not present

## 2015-08-23 DIAGNOSIS — E119 Type 2 diabetes mellitus without complications: Secondary | ICD-10-CM | POA: Diagnosis not present

## 2015-08-23 DIAGNOSIS — W19XXXD Unspecified fall, subsequent encounter: Secondary | ICD-10-CM | POA: Diagnosis not present

## 2015-08-24 DIAGNOSIS — R609 Edema, unspecified: Secondary | ICD-10-CM | POA: Diagnosis not present

## 2015-08-24 DIAGNOSIS — J189 Pneumonia, unspecified organism: Secondary | ICD-10-CM | POA: Diagnosis not present

## 2015-08-24 DIAGNOSIS — A419 Sepsis, unspecified organism: Secondary | ICD-10-CM | POA: Diagnosis not present

## 2015-08-24 DIAGNOSIS — E876 Hypokalemia: Secondary | ICD-10-CM | POA: Diagnosis not present

## 2015-08-24 DIAGNOSIS — G4700 Insomnia, unspecified: Secondary | ICD-10-CM | POA: Diagnosis not present

## 2015-08-25 DIAGNOSIS — E119 Type 2 diabetes mellitus without complications: Secondary | ICD-10-CM | POA: Diagnosis not present

## 2015-08-25 DIAGNOSIS — Z9181 History of falling: Secondary | ICD-10-CM | POA: Diagnosis not present

## 2015-08-25 DIAGNOSIS — Z8701 Personal history of pneumonia (recurrent): Secondary | ICD-10-CM | POA: Diagnosis not present

## 2015-08-25 DIAGNOSIS — W19XXXD Unspecified fall, subsequent encounter: Secondary | ICD-10-CM | POA: Diagnosis not present

## 2015-08-25 DIAGNOSIS — R600 Localized edema: Secondary | ICD-10-CM | POA: Diagnosis not present

## 2015-08-25 DIAGNOSIS — R5381 Other malaise: Secondary | ICD-10-CM | POA: Diagnosis not present

## 2015-08-26 DIAGNOSIS — R5381 Other malaise: Secondary | ICD-10-CM | POA: Diagnosis not present

## 2015-08-26 DIAGNOSIS — Z9181 History of falling: Secondary | ICD-10-CM | POA: Diagnosis not present

## 2015-08-26 DIAGNOSIS — Z8701 Personal history of pneumonia (recurrent): Secondary | ICD-10-CM | POA: Diagnosis not present

## 2015-08-26 DIAGNOSIS — W19XXXD Unspecified fall, subsequent encounter: Secondary | ICD-10-CM | POA: Diagnosis not present

## 2015-08-26 DIAGNOSIS — R600 Localized edema: Secondary | ICD-10-CM | POA: Diagnosis not present

## 2015-08-26 DIAGNOSIS — E119 Type 2 diabetes mellitus without complications: Secondary | ICD-10-CM | POA: Diagnosis not present

## 2015-08-29 ENCOUNTER — Other Ambulatory Visit: Payer: Self-pay | Admitting: Cardiovascular Disease

## 2015-08-29 DIAGNOSIS — R5381 Other malaise: Secondary | ICD-10-CM | POA: Diagnosis not present

## 2015-08-29 DIAGNOSIS — Z9181 History of falling: Secondary | ICD-10-CM | POA: Diagnosis not present

## 2015-08-29 DIAGNOSIS — W19XXXD Unspecified fall, subsequent encounter: Secondary | ICD-10-CM | POA: Diagnosis not present

## 2015-08-29 DIAGNOSIS — Z8701 Personal history of pneumonia (recurrent): Secondary | ICD-10-CM | POA: Diagnosis not present

## 2015-08-29 DIAGNOSIS — E119 Type 2 diabetes mellitus without complications: Secondary | ICD-10-CM | POA: Diagnosis not present

## 2015-08-29 DIAGNOSIS — R600 Localized edema: Secondary | ICD-10-CM | POA: Diagnosis not present

## 2015-08-29 NOTE — Telephone Encounter (Signed)
Beth (PT Ass.) called in stating that the pt's heart rate has been increasing over the past couple of weeks.   12/28-78 1/6-86 12/30-74 1/9-100 1/2-90 1/3-86 1/5-88  Please f/u with her Thanks

## 2015-08-30 DIAGNOSIS — Z9181 History of falling: Secondary | ICD-10-CM | POA: Diagnosis not present

## 2015-08-30 DIAGNOSIS — E119 Type 2 diabetes mellitus without complications: Secondary | ICD-10-CM | POA: Diagnosis not present

## 2015-08-30 DIAGNOSIS — R5381 Other malaise: Secondary | ICD-10-CM | POA: Diagnosis not present

## 2015-08-30 DIAGNOSIS — R600 Localized edema: Secondary | ICD-10-CM | POA: Diagnosis not present

## 2015-08-30 DIAGNOSIS — Z8701 Personal history of pneumonia (recurrent): Secondary | ICD-10-CM | POA: Diagnosis not present

## 2015-08-30 DIAGNOSIS — W19XXXD Unspecified fall, subsequent encounter: Secondary | ICD-10-CM | POA: Diagnosis not present

## 2015-08-31 DIAGNOSIS — Z8701 Personal history of pneumonia (recurrent): Secondary | ICD-10-CM | POA: Diagnosis not present

## 2015-08-31 DIAGNOSIS — R5381 Other malaise: Secondary | ICD-10-CM | POA: Diagnosis not present

## 2015-08-31 DIAGNOSIS — W19XXXD Unspecified fall, subsequent encounter: Secondary | ICD-10-CM | POA: Diagnosis not present

## 2015-08-31 DIAGNOSIS — Z9181 History of falling: Secondary | ICD-10-CM | POA: Diagnosis not present

## 2015-08-31 DIAGNOSIS — E119 Type 2 diabetes mellitus without complications: Secondary | ICD-10-CM | POA: Diagnosis not present

## 2015-08-31 DIAGNOSIS — R600 Localized edema: Secondary | ICD-10-CM | POA: Diagnosis not present

## 2015-09-02 DIAGNOSIS — Z9181 History of falling: Secondary | ICD-10-CM | POA: Diagnosis not present

## 2015-09-02 DIAGNOSIS — W19XXXD Unspecified fall, subsequent encounter: Secondary | ICD-10-CM | POA: Diagnosis not present

## 2015-09-02 DIAGNOSIS — Z8701 Personal history of pneumonia (recurrent): Secondary | ICD-10-CM | POA: Diagnosis not present

## 2015-09-02 DIAGNOSIS — E119 Type 2 diabetes mellitus without complications: Secondary | ICD-10-CM | POA: Diagnosis not present

## 2015-09-02 DIAGNOSIS — R600 Localized edema: Secondary | ICD-10-CM | POA: Diagnosis not present

## 2015-09-02 DIAGNOSIS — R5381 Other malaise: Secondary | ICD-10-CM | POA: Diagnosis not present

## 2015-09-05 DIAGNOSIS — R600 Localized edema: Secondary | ICD-10-CM | POA: Diagnosis not present

## 2015-09-05 DIAGNOSIS — Z9181 History of falling: Secondary | ICD-10-CM | POA: Diagnosis not present

## 2015-09-05 DIAGNOSIS — R5381 Other malaise: Secondary | ICD-10-CM | POA: Diagnosis not present

## 2015-09-05 DIAGNOSIS — E119 Type 2 diabetes mellitus without complications: Secondary | ICD-10-CM | POA: Diagnosis not present

## 2015-09-05 DIAGNOSIS — W19XXXD Unspecified fall, subsequent encounter: Secondary | ICD-10-CM | POA: Diagnosis not present

## 2015-09-05 DIAGNOSIS — Z8701 Personal history of pneumonia (recurrent): Secondary | ICD-10-CM | POA: Diagnosis not present

## 2015-09-06 DIAGNOSIS — R5381 Other malaise: Secondary | ICD-10-CM | POA: Diagnosis not present

## 2015-09-06 DIAGNOSIS — W19XXXD Unspecified fall, subsequent encounter: Secondary | ICD-10-CM | POA: Diagnosis not present

## 2015-09-06 DIAGNOSIS — R600 Localized edema: Secondary | ICD-10-CM | POA: Diagnosis not present

## 2015-09-06 DIAGNOSIS — Z9181 History of falling: Secondary | ICD-10-CM | POA: Diagnosis not present

## 2015-09-06 DIAGNOSIS — E119 Type 2 diabetes mellitus without complications: Secondary | ICD-10-CM | POA: Diagnosis not present

## 2015-09-06 DIAGNOSIS — Z8701 Personal history of pneumonia (recurrent): Secondary | ICD-10-CM | POA: Diagnosis not present

## 2015-09-07 DIAGNOSIS — R5381 Other malaise: Secondary | ICD-10-CM | POA: Diagnosis not present

## 2015-09-07 DIAGNOSIS — W19XXXD Unspecified fall, subsequent encounter: Secondary | ICD-10-CM | POA: Diagnosis not present

## 2015-09-07 DIAGNOSIS — Z9181 History of falling: Secondary | ICD-10-CM | POA: Diagnosis not present

## 2015-09-07 DIAGNOSIS — E119 Type 2 diabetes mellitus without complications: Secondary | ICD-10-CM | POA: Diagnosis not present

## 2015-09-07 DIAGNOSIS — Z8701 Personal history of pneumonia (recurrent): Secondary | ICD-10-CM | POA: Diagnosis not present

## 2015-09-07 DIAGNOSIS — R600 Localized edema: Secondary | ICD-10-CM | POA: Diagnosis not present

## 2015-09-08 DIAGNOSIS — W19XXXD Unspecified fall, subsequent encounter: Secondary | ICD-10-CM | POA: Diagnosis not present

## 2015-09-08 DIAGNOSIS — R600 Localized edema: Secondary | ICD-10-CM | POA: Diagnosis not present

## 2015-09-08 DIAGNOSIS — R5381 Other malaise: Secondary | ICD-10-CM | POA: Diagnosis not present

## 2015-09-08 DIAGNOSIS — E119 Type 2 diabetes mellitus without complications: Secondary | ICD-10-CM | POA: Diagnosis not present

## 2015-09-08 DIAGNOSIS — Z9181 History of falling: Secondary | ICD-10-CM | POA: Diagnosis not present

## 2015-09-08 DIAGNOSIS — Z8701 Personal history of pneumonia (recurrent): Secondary | ICD-10-CM | POA: Diagnosis not present

## 2015-09-09 DIAGNOSIS — Z9181 History of falling: Secondary | ICD-10-CM | POA: Diagnosis not present

## 2015-09-09 DIAGNOSIS — R600 Localized edema: Secondary | ICD-10-CM | POA: Diagnosis not present

## 2015-09-09 DIAGNOSIS — W19XXXD Unspecified fall, subsequent encounter: Secondary | ICD-10-CM | POA: Diagnosis not present

## 2015-09-09 DIAGNOSIS — R5381 Other malaise: Secondary | ICD-10-CM | POA: Diagnosis not present

## 2015-09-09 DIAGNOSIS — Z8701 Personal history of pneumonia (recurrent): Secondary | ICD-10-CM | POA: Diagnosis not present

## 2015-09-09 DIAGNOSIS — E119 Type 2 diabetes mellitus without complications: Secondary | ICD-10-CM | POA: Diagnosis not present

## 2015-09-12 DIAGNOSIS — Z9181 History of falling: Secondary | ICD-10-CM | POA: Diagnosis not present

## 2015-09-12 DIAGNOSIS — R5381 Other malaise: Secondary | ICD-10-CM | POA: Diagnosis not present

## 2015-09-12 DIAGNOSIS — Z8701 Personal history of pneumonia (recurrent): Secondary | ICD-10-CM | POA: Diagnosis not present

## 2015-09-12 DIAGNOSIS — E119 Type 2 diabetes mellitus without complications: Secondary | ICD-10-CM | POA: Diagnosis not present

## 2015-09-12 DIAGNOSIS — R600 Localized edema: Secondary | ICD-10-CM | POA: Diagnosis not present

## 2015-09-12 DIAGNOSIS — W19XXXD Unspecified fall, subsequent encounter: Secondary | ICD-10-CM | POA: Diagnosis not present

## 2015-09-13 DIAGNOSIS — W19XXXD Unspecified fall, subsequent encounter: Secondary | ICD-10-CM | POA: Diagnosis not present

## 2015-09-13 DIAGNOSIS — Z8701 Personal history of pneumonia (recurrent): Secondary | ICD-10-CM | POA: Diagnosis not present

## 2015-09-13 DIAGNOSIS — R5381 Other malaise: Secondary | ICD-10-CM | POA: Diagnosis not present

## 2015-09-13 DIAGNOSIS — Z9181 History of falling: Secondary | ICD-10-CM | POA: Diagnosis not present

## 2015-09-13 DIAGNOSIS — R600 Localized edema: Secondary | ICD-10-CM | POA: Diagnosis not present

## 2015-09-13 DIAGNOSIS — E119 Type 2 diabetes mellitus without complications: Secondary | ICD-10-CM | POA: Diagnosis not present

## 2015-09-14 DIAGNOSIS — I739 Peripheral vascular disease, unspecified: Secondary | ICD-10-CM | POA: Diagnosis not present

## 2015-09-14 DIAGNOSIS — E1151 Type 2 diabetes mellitus with diabetic peripheral angiopathy without gangrene: Secondary | ICD-10-CM | POA: Diagnosis not present

## 2015-09-14 DIAGNOSIS — L603 Nail dystrophy: Secondary | ICD-10-CM | POA: Diagnosis not present

## 2015-09-15 DIAGNOSIS — E7800 Pure hypercholesterolemia, unspecified: Secondary | ICD-10-CM | POA: Diagnosis not present

## 2015-09-15 DIAGNOSIS — I10 Essential (primary) hypertension: Secondary | ICD-10-CM | POA: Diagnosis not present

## 2015-09-15 DIAGNOSIS — Z8701 Personal history of pneumonia (recurrent): Secondary | ICD-10-CM | POA: Diagnosis not present

## 2015-09-15 DIAGNOSIS — E119 Type 2 diabetes mellitus without complications: Secondary | ICD-10-CM | POA: Diagnosis not present

## 2015-09-15 DIAGNOSIS — R5381 Other malaise: Secondary | ICD-10-CM | POA: Diagnosis not present

## 2015-09-15 DIAGNOSIS — Z9181 History of falling: Secondary | ICD-10-CM | POA: Diagnosis not present

## 2015-09-15 DIAGNOSIS — R600 Localized edema: Secondary | ICD-10-CM | POA: Diagnosis not present

## 2015-09-15 DIAGNOSIS — E039 Hypothyroidism, unspecified: Secondary | ICD-10-CM | POA: Diagnosis not present

## 2015-09-15 DIAGNOSIS — W19XXXD Unspecified fall, subsequent encounter: Secondary | ICD-10-CM | POA: Diagnosis not present

## 2015-09-18 ENCOUNTER — Emergency Department (HOSPITAL_COMMUNITY)
Admission: EM | Admit: 2015-09-18 | Discharge: 2015-09-18 | Disposition: A | Discharge status: Home / Self Care | Payer: Medicare Other | Attending: Emergency Medicine | Admitting: Emergency Medicine

## 2015-09-18 ENCOUNTER — Encounter (HOSPITAL_COMMUNITY): Payer: Self-pay | Admitting: *Deleted

## 2015-09-18 ENCOUNTER — Emergency Department (HOSPITAL_COMMUNITY): Payer: Medicare Other

## 2015-09-18 DIAGNOSIS — F419 Anxiety disorder, unspecified: Secondary | ICD-10-CM | POA: Diagnosis not present

## 2015-09-18 DIAGNOSIS — J45901 Unspecified asthma with (acute) exacerbation: Secondary | ICD-10-CM | POA: Insufficient documentation

## 2015-09-18 DIAGNOSIS — I10 Essential (primary) hypertension: Secondary | ICD-10-CM | POA: Diagnosis not present

## 2015-09-18 DIAGNOSIS — M1990 Unspecified osteoarthritis, unspecified site: Secondary | ICD-10-CM | POA: Insufficient documentation

## 2015-09-18 DIAGNOSIS — Z7982 Long term (current) use of aspirin: Secondary | ICD-10-CM | POA: Insufficient documentation

## 2015-09-18 DIAGNOSIS — Z8669 Personal history of other diseases of the nervous system and sense organs: Secondary | ICD-10-CM | POA: Insufficient documentation

## 2015-09-18 DIAGNOSIS — I2510 Atherosclerotic heart disease of native coronary artery without angina pectoris: Secondary | ICD-10-CM | POA: Insufficient documentation

## 2015-09-18 DIAGNOSIS — Z7902 Long term (current) use of antithrombotics/antiplatelets: Secondary | ICD-10-CM | POA: Diagnosis not present

## 2015-09-18 DIAGNOSIS — E119 Type 2 diabetes mellitus without complications: Secondary | ICD-10-CM | POA: Diagnosis not present

## 2015-09-18 DIAGNOSIS — Z7952 Long term (current) use of systemic steroids: Secondary | ICD-10-CM | POA: Insufficient documentation

## 2015-09-18 DIAGNOSIS — R05 Cough: Secondary | ICD-10-CM | POA: Diagnosis not present

## 2015-09-18 DIAGNOSIS — J40 Bronchitis, not specified as acute or chronic: Secondary | ICD-10-CM

## 2015-09-18 DIAGNOSIS — R0602 Shortness of breath: Secondary | ICD-10-CM | POA: Diagnosis present

## 2015-09-18 DIAGNOSIS — E039 Hypothyroidism, unspecified: Secondary | ICD-10-CM | POA: Insufficient documentation

## 2015-09-18 LAB — CBC WITH DIFFERENTIAL/PLATELET
BASOS ABS: 0.1 10*3/uL (ref 0.0–0.1)
Basophils Relative: 1 %
EOS PCT: 8 %
Eosinophils Absolute: 0.4 10*3/uL (ref 0.0–0.7)
HEMATOCRIT: 34 % — AB (ref 36.0–46.0)
HEMOGLOBIN: 10.6 g/dL — AB (ref 12.0–15.0)
LYMPHS ABS: 1.8 10*3/uL (ref 0.7–4.0)
LYMPHS PCT: 30 %
MCH: 27 pg (ref 26.0–34.0)
MCHC: 31.2 g/dL (ref 30.0–36.0)
MCV: 86.7 fL (ref 78.0–100.0)
Monocytes Absolute: 0.7 10*3/uL (ref 0.1–1.0)
Monocytes Relative: 12 %
NEUTROS ABS: 2.9 10*3/uL (ref 1.7–7.7)
Neutrophils Relative %: 49 %
PLATELETS: 241 10*3/uL (ref 150–400)
RBC: 3.92 MIL/uL (ref 3.87–5.11)
RDW: 15.1 % (ref 11.5–15.5)
WBC: 5.9 10*3/uL (ref 4.0–10.5)

## 2015-09-18 LAB — BASIC METABOLIC PANEL
ANION GAP: 14 (ref 5–15)
BUN: 52 mg/dL — AB (ref 6–20)
CHLORIDE: 100 mmol/L — AB (ref 101–111)
CO2: 27 mmol/L (ref 22–32)
Calcium: 9.3 mg/dL (ref 8.9–10.3)
Creatinine, Ser: 1.47 mg/dL — ABNORMAL HIGH (ref 0.44–1.00)
GFR calc Af Amer: 36 mL/min — ABNORMAL LOW (ref >60)
GFR, EST NON AFRICAN AMERICAN: 31 mL/min — AB (ref >60)
GLUCOSE: 96 mg/dL (ref 65–99)
POTASSIUM: 3 mmol/L — AB (ref 3.5–5.1)
Sodium: 141 mmol/L (ref 135–145)

## 2015-09-18 LAB — I-STAT CG4 LACTIC ACID, ED: Lactic Acid, Venous: 1.24 mmol/L (ref 0.5–2.0)

## 2015-09-18 MED ORDER — POTASSIUM CHLORIDE CRYS ER 20 MEQ PO TBCR
40.0000 meq | EXTENDED_RELEASE_TABLET | Freq: Once | ORAL | Status: AC
  Administered 2015-09-18: 40 meq via ORAL
  Filled 2015-09-18: qty 2

## 2015-09-18 MED ORDER — METHYLPREDNISOLONE SODIUM SUCC 125 MG IJ SOLR
125.0000 mg | Freq: Once | INTRAMUSCULAR | Status: AC
  Administered 2015-09-18: 125 mg via INTRAVENOUS
  Filled 2015-09-18: qty 2

## 2015-09-18 MED ORDER — PROMETHAZINE-DM 6.25-15 MG/5ML PO SYRP: 5.0000 mL | ORAL_SOLUTION | Freq: Four times a day (QID) | ORAL | Status: DC | PRN

## 2015-09-18 MED ORDER — IPRATROPIUM-ALBUTEROL 0.5-2.5 (3) MG/3ML IN SOLN
3.0000 mL | Freq: Once | RESPIRATORY_TRACT | Status: AC
  Administered 2015-09-18: 3 mL via RESPIRATORY_TRACT
  Filled 2015-09-18: qty 3

## 2015-09-18 MED ORDER — PREDNISONE 20 MG PO TABS: 60.0000 mg | ORAL_TABLET | Freq: Once | ORAL | Status: DC

## 2015-09-18 MED ORDER — SODIUM CHLORIDE 0.9 % IV BOLUS (SEPSIS)
1000.0000 mL | Freq: Once | INTRAVENOUS | Status: AC
  Administered 2015-09-18: 1000 mL via INTRAVENOUS

## 2015-09-18 MED ORDER — PREDNISONE 50 MG PO TABS: 50.0000 mg | ORAL_TABLET | Freq: Every day | ORAL | Status: DC

## 2015-09-18 MED ORDER — GUAIFENESIN ER 1200 MG PO TB12: 1.0000 | ORAL_TABLET | Freq: Two times a day (BID) | ORAL | Status: DC

## 2015-09-18 NOTE — Discharge Instructions (Signed)
Return here as needed.  Follow-up with your primary care doctor, increase her fluid intake and rest as much as possible °

## 2015-09-18 NOTE — ED Provider Notes (Signed)
CSN: [id number]     Arrival date & time 09/18/15  1438 History   First MD Initiated Contact with Patient 09/18/15 1518     Chief Complaint  Patient presents with  . Shortness of Breath     (Consider location/radiation/quality/duration/timing/severity/associated sxs/prior Treatment) HPI Patient presents to the emergency department with cough with subjective fever for the last 2 days.  The patient states that seems to make her condition better.  She does states she has a little shortness of breath with exertion and that increases her cough.  Patient denies chest pain, nausea, vomiting, weakness, dizziness, headache, blurred vision, back pain, neck pain, dysuria, incontinence, near syncope or syncope.  The patient states that she did not take any medications prior to arrival Past Medical History  Diagnosis Date  . Asthma   . Coronary artery disease   . Diabetes mellitus without complication ([place])   . Hypothyroidism   . GERD (gastroesophageal reflux disease)   . Insomnia   . Anxiety   . Hypertension   . CAD (coronary artery disease)   . Osteopenia   . Osteoarthritis   . H/O echocardiogram 06-26-2012    EF 55%  . H/O cardiovascular stress test 06-26-2012    compared to previous study there is no significant change; normal myocardial perfusion study. no significant ischemia demonstrated . this is a low risk scan  . History of cardiac cath 09-30-1998    cath done by Dr. Claiborne Billings, successful rotation atherectomy of the left anterior decending artert with reduction from 80 to105  . Hx of cardiac cath 09-26-1998    dignostic cath dr. Claiborne Billings to do rotoblator at a later date  . History of Doppler ultrasound 03-30-2008    neg abd. doppler for aneurysm  . History of Holter monitoring 12-22-2007    NSR with pac occs. pvc's   Past Surgical History  Procedure Laterality Date  . Appendectomy  1947  . Dilation and curettage of uterus  1960  . Vesicovaginal fistula closure w/ tah  1971    Dr Mallie Mussel  .  Bladder tack  1970    Dr Rosana Hoes  . Breast lumpectomy  1970    Left, Dr Leandrew Koyanagi  . Nasal sinus surgery  1980    Left; for chronic sinusitis  . Revision total knee arthroplasty  2005    Dr Alvan Dame Right knee   Family History  Problem Relation Age of Onset  . Diabetes Father   . Parkinson's disease Father   . Heart disease Father   . Asthma Father   . Diabetes Mother   . Heart disease Mother   . Heart disease Brother   . Diabetes Brother   . Diabetes Brother   . Diabetes Brother    Social History  Substance Use Topics  . Smoking status: Never Smoker   . Smokeless tobacco: Never Used  . Alcohol Use: No   OB History    No data available     Review of Systems   All other systems negative except as documented in the HPI. All pertinent positives and negatives as reviewed in the HPI. Allergies  Review of patient's allergies indicates no known allergies.  Home Medications   Prior to Admission medications   Medication Sig Start Date End Date Taking? Authorizing Provider  acetaminophen-codeine (TYLENOL #3) 300-30 MG tablet Take 1 tablet by mouth every 8 (eight) hours as needed for moderate pain.   Yes Historical Provider, MD  albuterol (PROVENTIL HFA;VENTOLIN HFA) 108 (90 Base) MCG/ACT  inhaler Inhale 2 puffs into the lungs every 6 (six) hours as needed for wheezing or shortness of breath.   Yes Historical Provider, MD  albuterol (PROVENTIL) (2.5 MG/3ML) 0.083% nebulizer solution Take 3 mLs (2.5 mg total) by nebulization every 6 (six) hours as needed for wheezing. 05/02/15  Yes Deneise Lever, MD  aspirin EC 81 MG tablet Take 81 mg by mouth at bedtime.    Yes Historical Provider, MD  azelastine (ASTELIN) 137 MCG/SPRAY nasal spray Place 1 spray into the nose 2 (two) times daily. Use in each nostril as directed   Yes Historical Provider, MD  benzonatate (TESSALON) 200 MG capsule Take 1 capsule (200 mg total) by mouth 3 (three) times daily as needed for cough. 05/02/15  Yes Deneise Lever,  MD  clopidogrel (PLAVIX) 75 MG tablet Take 75 mg by mouth daily.   Yes Historical Provider, MD  clorazepate (TRANXENE) 15 MG tablet Take 15 mg by mouth at bedtime. anxiety   Yes Historical Provider, MD  furosemide (LASIX) 40 MG tablet Take 20 mg by mouth daily.    Yes Historical Provider, MD  glimepiride (AMARYL) 1 MG tablet Take 1 mg by mouth daily before breakfast.   Yes Historical Provider, MD  Guaifenesin 1200 MG TB12 Take 1,200 mg by mouth 2 (two) times daily.   Yes Historical Provider, MD  ibuprofen (ADVIL,MOTRIN) 200 MG tablet Take 400 mg by mouth every 6 (six) hours as needed for headache or moderate pain.   Yes Historical Provider, MD  levothyroxine (SYNTHROID, LEVOTHROID) 50 MCG tablet Take 25 mcg by mouth daily.    Yes Historical Provider, MD  loperamide (IMODIUM A-D) 2 MG tablet Take 2 mg by mouth as needed for diarrhea or loose stools.    Yes Historical Provider, MD  LORazepam (ATIVAN) 1 MG tablet Take 1 mg by mouth every 8 (eight) hours as needed for anxiety.   Yes Historical Provider, MD  metoprolol succinate (TOPROL-XL) 50 MG 24 hr tablet Take 100 mg by mouth daily. 09/13/15  Yes Historical Provider, MD  montelukast (SINGULAIR) 10 MG tablet Take 10 mg by mouth at bedtime.   Yes Historical Provider, MD  Multiple Vitamin (MULTIVITAMIN WITH MINERALS) TABS tablet Take 1 tablet by mouth daily.   Yes Historical Provider, MD  nitroGLYCERIN (NITROSTAT) 0.4 MG SL tablet Place 0.4 mg under the tongue every 5 (five) minutes as needed. For chest pain   Yes Historical Provider, MD  omeprazole (PRILOSEC) 20 MG capsule Take 20-40 mg by mouth daily as needed (acid reflux).   Yes Historical Provider, MD  QUEtiapine (SEROQUEL) 25 MG tablet Take 50 mg by mouth daily with breakfast.   Yes Historical Provider, MD  rosuvastatin (CRESTOR) 20 MG tablet Take 20 mg by mouth at bedtime.    Yes Historical Provider, MD  topiramate (TOPAMAX) 25 MG tablet Take 25 mg by mouth at bedtime.   Yes Historical Provider,  MD  Umeclidinium-Vilanterol (ANORO ELLIPTA) 62.5-25 MCG/INH AEPB Inhale 1 puff into the lungs daily. 01/27/15  Yes Deneise Lever, MD  ZETIA 10 MG tablet Take 10 mg by mouth daily. 08/15/15  Yes Historical Provider, MD  acetaminophen-codeine 120-12 MG/5ML solution Take 10 mLs by mouth every 4 (four) hours as needed (cough). 07/19/15   Donne Hazel, MD  predniSONE (DELTASONE) 5 MG tablet Take 1 tablet (5 mg total) by mouth daily with breakfast. 07/19/15   Donne Hazel, MD   BP 121/47 mmHg  Pulse 84  Temp(Src) 98.9 F (37.2  C) (Oral)  Resp 15  SpO2 96% Physical Exam  Constitutional: She is oriented to person, place, and time. She appears well-developed and well-nourished. No distress.  HENT:  Head: Normocephalic and atraumatic.  Mouth/Throat: Oropharynx is clear and moist.  Eyes: Pupils are equal, round, and reactive to light.  Neck: Normal range of motion. Neck supple.  Cardiovascular: Normal rate, regular rhythm and normal heart sounds.  Exam reveals no gallop and no friction rub.   No murmur heard. Pulmonary/Chest: Effort normal and breath sounds normal. No respiratory distress. She has no wheezes.  Abdominal: Soft. Bowel sounds are normal. She exhibits no distension. There is no tenderness.  Neurological: She is alert and oriented to person, place, and time. She exhibits normal muscle tone. Coordination normal.  Skin: Skin is warm and dry. No rash noted. No erythema.  Psychiatric: She has a normal mood and affect. Her behavior is normal.  Nursing note and vitals reviewed.   ED Course  Procedures (including critical care time) Labs Review Labs Reviewed  CBC WITH DIFFERENTIAL/PLATELET - Abnormal; Notable for the following:    Hemoglobin 10.6 (*)    HCT 34.0 (*)    All other components within normal limits  BASIC METABOLIC PANEL - Abnormal; Notable for the following:    Potassium 3.0 (*)    Chloride 100 (*)    BUN 52 (*)    Creatinine, Ser 1.47 (*)    GFR calc non Af Amer  31 (*)    GFR calc Af Amer 36 (*)    All other components within normal limits  I-STAT CG4 LACTIC ACID, ED    Imaging Review Dg Chest 2 View  09/18/2015  CLINICAL DATA:  Shortness of breath on exertion with cough starting 2 days ago. EXAM: CHEST  2 VIEW COMPARISON:  07/15/2015. FINDINGS: The lungs are clear wiithout focal pneumonia, edema, pneumothorax or pleural effusion. Hyperexpansion noted. Interstitial markings are diffusely coarsened with chronic features. Minimal fullness right hilum stable compared to multiple prior studies. The cardiopericardial silhouette is within normal limits for size. The visualized bony structures of the thorax are intact. IMPRESSION: No active cardiopulmonary disease. Electronically Signed   By: Misty Stanley M.D.   On: 09/18/2015 15:23   I have personally reviewed and evaluated these images and lab results as part of my medical decision-making.    MDM   Final diagnoses:  None     Patient be discharged home with a diagnosis of bronchitis.  Told to return here as needed.  Patient is advised of the results and all questions were answered.  Advised the patient follow up with her primary Dr. for recheck  Dalia Heading, PA-C 09/19/15 [place], MD 09/20/15 [phone]

## 2015-09-18 NOTE — ED Notes (Addendum)
Patient states she has headache, SOB on exertion and cough that started on Friday.  Patient reports subjective fever last night and cough became productive of sputum today.  Patient states she was able to participate in physical/occupational therapy at home on Thursday without exertional dyspnea.  Patient denies N/V/D and orthostatic dizziness.  Patient was hospitalized for CAP in November 2016.  Patient denies nasal congestion, otalgia and sinus pressure/congestion.  Patient denies chest pain and abdominal pain, but c/o chronic right knee pain due to knee replacement.  Patient denies urinary s/s.  Patient in NAD, expiratory wheezing noted, >LLL.

## 2015-09-22 ENCOUNTER — Other Ambulatory Visit: Payer: Self-pay | Admitting: Internal Medicine

## 2015-09-22 DIAGNOSIS — Z8701 Personal history of pneumonia (recurrent): Secondary | ICD-10-CM | POA: Diagnosis not present

## 2015-09-22 DIAGNOSIS — W19XXXD Unspecified fall, subsequent encounter: Secondary | ICD-10-CM | POA: Diagnosis not present

## 2015-09-22 DIAGNOSIS — R600 Localized edema: Secondary | ICD-10-CM | POA: Diagnosis not present

## 2015-09-22 DIAGNOSIS — Z9181 History of falling: Secondary | ICD-10-CM | POA: Diagnosis not present

## 2015-09-22 DIAGNOSIS — R5381 Other malaise: Secondary | ICD-10-CM | POA: Diagnosis not present

## 2015-09-22 DIAGNOSIS — E119 Type 2 diabetes mellitus without complications: Secondary | ICD-10-CM | POA: Diagnosis not present

## 2015-09-22 NOTE — Telephone Encounter (Signed)
Ok to refill 

## 2015-09-22 NOTE — Telephone Encounter (Signed)
CY please advise on refill. Thanks. 

## 2015-09-26 ENCOUNTER — Ambulatory Visit (INDEPENDENT_AMBULATORY_CARE_PROVIDER_SITE_OTHER): Payer: Medicare Other | Admitting: Internal Medicine

## 2015-09-26 ENCOUNTER — Encounter: Payer: Self-pay | Admitting: Internal Medicine

## 2015-09-26 VITALS — BP 128/68 | HR 94 | Ht 61.0 in | Wt 189.6 lb

## 2015-09-26 DIAGNOSIS — J4531 Mild persistent asthma with (acute) exacerbation: Secondary | ICD-10-CM

## 2015-09-26 DIAGNOSIS — I480 Paroxysmal atrial fibrillation: Secondary | ICD-10-CM | POA: Diagnosis not present

## 2015-09-26 MED ORDER — AMOXICILLIN 500 MG PO TABS: ORAL_TABLET | ORAL | Status: DC

## 2015-09-26 MED ORDER — TRAMADOL HCL 50 MG PO TABS: 50.0000 mg | ORAL_TABLET | Freq: Four times a day (QID) | ORAL | Status: DC | PRN

## 2015-09-26 MED ORDER — PREDNISONE 10 MG PO TABS: ORAL_TABLET | ORAL | Status: DC

## 2015-09-26 MED ORDER — BENZONATATE 200 MG PO CAPS: 200.0000 mg | ORAL_CAPSULE | Freq: Three times a day (TID) | ORAL | Status: DC | PRN

## 2015-09-26 NOTE — Patient Instructions (Addendum)
Script sent for benzonatate perles and printed for tramadol. Use these as directed for cough, and supplement with otc cough syrup like Delsym, and cough drops as needed  Script sent for prednisone, and for amoxacillin antibiotic  Ok to keep March appointment   Prevnar 13

## 2015-09-26 NOTE — Progress Notes (Signed)
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 Now describes persistent nasal congestion and feels that she has had a sinusitis since October, 2013. Asthma flare in December needing nebulizer treatments twice daily, Z-Pak, 2 or 3 weeks of prednisone and Augmentin. CT maxillofacial-mild sphenoid sinusitis and an incidental right carotid lesion for which she is seeing Dr Erik Obey soon. Remains short of breath and aware of weight gain. Wheeze in cold air. CAD/ Dr Claiborne Billings, valvular heart disease, rotablator procedure.  CXR 07/19/12 IMPRESSION:  No acute abnormality. Extensive coronary artery calcification.  Original Report Authenticated By: Lorriane Shire, M.D.   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 Dr Erik Obey had done CTmax/fac- left sphenoid air-fluid level. She says she has had "asthma" all winter, meaning shortness of breath and some chest tightness. Not relieved by Spiriva. Physically very inactive. She was unable to perform 6 minute walk test PFT: 10/10/2012-mild obstructive airways disease with insignificant response to bronchodilator. Air-trapping with increased residual volume. Diffusion mildly reduced. FEC 1.61/76%, 11.05/77%, FEV1/FVC 0.65, FEF 25-75% 0.56/33%. RV 134%, DLCO 76%.  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 Dr Erik Obey ENT saw raw area near larynx "cough and reflux". Wheezes with any exertion. Mostly dry cough. Proair 1x daily, nebulizer 2-3x/ week.  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 Denies any fever or chills.  Husband here              got flu vaccine from primary physician Describes some green sputum with cough. Following with ENT for some ulceration on vocal cord attributed to reflux and cough. CT of sinuses reported clear. Continues Anoro, pro-air, Singulair, omeprazole. CXR 01/20/15 IMPRESSION: No active cardiopulmonary disease. Electronically Signed  By: Kerby Moors M.D.  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  went to the ED on 1/29 and given pred 50mg  and prometh-cod syrup.  taking the tessalon regularly and would like additional refills.  denies nay tightness, hemoptyis Lies before Christmas with what she calls pneumonia. I don't find chest x-rays demonstrating pneumonia, just bronchitis changes. She spent 3 weeks in rehabilitation. Complains of persistent productive cough "dark gray" with no blood, chest pain, fever or adenopathy. Using nebulizer with albuterol 3 or 4 times daily. She feels she needs an antibiotic. CXR 09/18/2015- I reviewed images FINDINGS: The lungs are clear wiithout focal pneumonia, edema, pneumothorax or pleural effusion. Hyperexpansion noted. Interstitial markings are diffusely coarsened with chronic features. Minimal fullness right hilum stable compared to multiple prior studies. The cardiopericardial silhouette is within  normal limits for size.  The visualized bony structures of the thorax are intact. IMPRESSION: No active cardiopulmonary disease. Electronically Signed  By: Misty Stanley M.D.  On: 09/18/2015 15:23  ROS-see HPI Constitutional:   No-   weight loss, night sweats, fevers, chills, fatigue, lassitude. HEENT:   No-  headaches, difficulty swallowing, tooth/dental problems, sore throat,       No-  sneezing, itching, ear ache, +nasal congestion, +post nasal drip,  CV:  No-   chest pain, orthopnea, PND, swelling in lower extremities, anasarca,                                                        dizziness, palpitations Resp:+ shortness of breath with exertion or at rest.              No-   productive cough,  No non-productive cough,  No- coughing up of blood.              No-   change in color of mucus.  + wheezing.   Skin: No-   rash or lesions. GI:  No-   heartburn, indigestion, abdominal pain, nausea, vomiting,  GU:  MS:  No-   joint pain or swelling.   Neuro-     nothing unusual Psych:  No- change in mood or affect. No depression or anxiety.  No memory loss.  OBJ- Physical Exam General- Alert, Oriented, Affect-appropriate, Distress- none acute, + obese, + elderly Skin- rash-none, lesions- none, excoriation- none Lymphadenopathy- none Head- atraumatic            Eyes- Gross vision intact, PERRLA, conjunctivae and secretions clear            Ears- Hearing, canals-normal            Nose- Clear, no-Septal dev, mucus, polyps, erosion, perforation             Throat- Mallampati III , mucosa clear , drainage- none, tonsils- atrophic, + broken tooth Neck- flexible , trachea midline, no stridor , thyroid nl, carotid no bruit Chest - symmetrical excursion , unlabored           Heart/CV- RRR , no murmur heard , no gallop  , no rub, nl s1 s2                           - JVD- none , edema- none, stasis changes- none, varices- none           Lung- unlabored, wheeze+trace, cough-+ light ,  dullness-none, rub- none.            Chest wall-  Abd-  Br/ Gen/ Rectal- Not done, not indicated Extrem- cyanosis- none, clubbing, none, atrophy- none, strength- nl. + Walker Neuro- grossly intact to observation

## 2015-09-27 DIAGNOSIS — E119 Type 2 diabetes mellitus without complications: Secondary | ICD-10-CM | POA: Diagnosis not present

## 2015-09-27 DIAGNOSIS — I10 Essential (primary) hypertension: Secondary | ICD-10-CM | POA: Diagnosis not present

## 2015-09-27 DIAGNOSIS — R05 Cough: Secondary | ICD-10-CM | POA: Diagnosis not present

## 2015-09-27 NOTE — Assessment & Plan Note (Signed)
We will treat this as persistent bronchitis Exline plan amoxicillin, prednisone. May use Tessalon, tramadol or Delsym cough syrup if needed for cough. Watch for other causes of cough such as recurrent aspiration as discussed

## 2015-09-27 NOTE — Assessment & Plan Note (Signed)
Pulse regular consistent with sinus rhythm on evaluation at this visit

## 2015-09-29 DIAGNOSIS — Z9181 History of falling: Secondary | ICD-10-CM | POA: Diagnosis not present

## 2015-09-29 DIAGNOSIS — W19XXXD Unspecified fall, subsequent encounter: Secondary | ICD-10-CM | POA: Diagnosis not present

## 2015-09-29 DIAGNOSIS — R600 Localized edema: Secondary | ICD-10-CM | POA: Diagnosis not present

## 2015-09-29 DIAGNOSIS — Z8701 Personal history of pneumonia (recurrent): Secondary | ICD-10-CM | POA: Diagnosis not present

## 2015-09-29 DIAGNOSIS — R5381 Other malaise: Secondary | ICD-10-CM | POA: Diagnosis not present

## 2015-09-29 DIAGNOSIS — E119 Type 2 diabetes mellitus without complications: Secondary | ICD-10-CM | POA: Diagnosis not present

## 2015-10-01 ENCOUNTER — Other Ambulatory Visit: Payer: Self-pay | Admitting: Adult Health

## 2015-10-03 DIAGNOSIS — Z9181 History of falling: Secondary | ICD-10-CM | POA: Diagnosis not present

## 2015-10-03 DIAGNOSIS — Z8701 Personal history of pneumonia (recurrent): Secondary | ICD-10-CM | POA: Diagnosis not present

## 2015-10-03 DIAGNOSIS — R5381 Other malaise: Secondary | ICD-10-CM | POA: Diagnosis not present

## 2015-10-03 DIAGNOSIS — E119 Type 2 diabetes mellitus without complications: Secondary | ICD-10-CM | POA: Diagnosis not present

## 2015-10-03 DIAGNOSIS — W19XXXD Unspecified fall, subsequent encounter: Secondary | ICD-10-CM | POA: Diagnosis not present

## 2015-10-03 DIAGNOSIS — R600 Localized edema: Secondary | ICD-10-CM | POA: Diagnosis not present

## 2015-10-07 ENCOUNTER — Other Ambulatory Visit: Payer: Self-pay | Admitting: Adult Health

## 2015-10-24 ENCOUNTER — Encounter: Payer: Self-pay | Admitting: Cardiovascular Disease

## 2015-10-24 ENCOUNTER — Ambulatory Visit (INDEPENDENT_AMBULATORY_CARE_PROVIDER_SITE_OTHER): Payer: Medicare Other | Admitting: Cardiovascular Disease

## 2015-10-24 VITALS — BP 108/70 | HR 93 | Ht 61.0 in | Wt 183.0 lb

## 2015-10-24 DIAGNOSIS — I10 Essential (primary) hypertension: Secondary | ICD-10-CM

## 2015-10-24 DIAGNOSIS — I2510 Atherosclerotic heart disease of native coronary artery without angina pectoris: Secondary | ICD-10-CM

## 2015-10-24 DIAGNOSIS — I447 Left bundle-branch block, unspecified: Secondary | ICD-10-CM | POA: Diagnosis not present

## 2015-10-24 DIAGNOSIS — E039 Hypothyroidism, unspecified: Secondary | ICD-10-CM | POA: Diagnosis not present

## 2015-10-24 DIAGNOSIS — K219 Gastro-esophageal reflux disease without esophagitis: Secondary | ICD-10-CM

## 2015-10-24 NOTE — Progress Notes (Signed)
Patient ID: [name], female   DOB: 08-12-1925, 82 y.o.   MRN: [id number]    Primary M.D.: Dr. [name] Pulmonary, M.D. : Dr. Keturah Barre  HPI: [name],is a 82 y.o. female who has known coronary artery disease and presents to the office today for a one year followup evaluation.  [name] has known CAD and underwent  high-speed rotational atherectomy of a calcified LAD in the region of the very large diagonal vessel by me in 2000. She had excellent angiographic result. She been cared for by Dr. Rex Kras in the past. She has a history of hypothyroidism, hyperlipidemia, asthma, as well as hypertension. She was hospitalized last year for lower chest pain and had transient ECG changes with transient left bundle branch block, right bundle branch block. [name] beta blocker dose was reduced. She was felt most likely to have GI symptoms. An upper GI did suggested the possibility of a hiatal hernia. She underwent endoscopy  and she was told of having some small polyps.  She has a history of hypertension and is on Micardis HCT 80/25, Toprol 100 mg in the morning.  She has a history of hyper lipidemia and has been on Zetia 10 mg in addition to Crestor 20 mg daily.  She has been taking Synthroid 25 g for mild hypothyroidism.  Type 2 diabetes mellitus has been treated with Clomid per minute 1 mg.  She continues to be on dual antiplatelet therapy.  Since I saw [name] one year ago she has had some issues with asthma.  She  recently saw Dr. Cristina Gong and was placed on probiotics.  She has required back injections into [name] lower spine by Dr. Nelva Bush.  She denies recurrent anginal symptoms.  She denies significant shortness of breath.    Since I last saw [name] in December 2015 she was hospitalized in November 2016 with pneumonia and then  spent 3 weeks at [facility] for rehabilitation.  In January 2017 she developed a significant episode of bronchitis.  He has been weak.  She does note some mild shortness of breath.   Recent blood work on 09/15/2015 by Dr. Maudie Mercury was reviewed.  [name] hemoglobin was 11, hematocrit 36.1.  BUN 54, creatinine 1.4.  Fasting sugar was 162.  Cholesterol was 182 with an HDL of 61 and a calculated LDL of 61, but [name] triglycerides were elevated at 299.  TSH was 0.49.  Magnesium level was normal.  She presents for evaluation.  Past Medical History  Diagnosis Date  . Asthma   . Coronary artery disease   . Diabetes mellitus without complication ([place])   . Hypothyroidism   . GERD (gastroesophageal reflux disease)   . Insomnia   . Anxiety   . Hypertension   . CAD (coronary artery disease)   . Osteopenia   . Osteoarthritis   . H/O echocardiogram 06-26-2012    EF 55%  . H/O cardiovascular stress test 06-26-2012    compared to previous study there is no significant change; normal myocardial perfusion study. no significant ischemia demonstrated . this is a low risk scan  . History of cardiac cath 09-30-1998    cath done by Dr. Claiborne Billings, successful rotation atherectomy of the left anterior decending artert with reduction from 80 to105  . Hx of cardiac cath 09-26-1998    dignostic cath dr. Claiborne Billings to do rotoblator at a later date  . History of Doppler ultrasound 03-30-2008    neg abd. doppler for aneurysm  . History of  Holter monitoring 12-22-2007    NSR with pac occs. pvc's    Past Surgical History  Procedure Laterality Date  . Appendectomy  1947  . Dilation and curettage of uterus  1960  . Vesicovaginal fistula closure w/ tah  1971    Dr Mallie Mussel  . Bladder tack  1970    Dr Rosana Hoes  . Breast lumpectomy  1970    Left, Dr Leandrew Koyanagi  . Nasal sinus surgery  1980    Left; for chronic sinusitis  . Revision total knee arthroplasty  2005    Dr Alvan Dame Right knee    No Known Allergies  Current Outpatient Prescriptions  Medication Sig Dispense Refill  . acetaminophen-codeine (TYLENOL #3) 300-30 MG tablet Take 1 tablet by mouth every 8 (eight) hours as needed for moderate pain.    Marland Kitchen acetaminophen-codeine  120-12 MG/5ML solution Take 10 mLs by mouth every 4 (four) hours as needed (cough). 120 mL 0  . albuterol (PROVENTIL HFA;VENTOLIN HFA) 108 (90 Base) MCG/ACT inhaler Inhale 2 puffs into the lungs every 6 (six) hours as needed for wheezing or shortness of breath.    Marland Kitchen albuterol (PROVENTIL) (2.5 MG/3ML) 0.083% nebulizer solution Take 3 mLs (2.5 mg total) by nebulization every 6 (six) hours as needed for wheezing. 75 mL 11  . aspirin EC 81 MG tablet Take 81 mg by mouth at bedtime.     Marland Kitchen azelastine (ASTELIN) 137 MCG/SPRAY nasal spray Place 1 spray into the nose 2 (two) times daily. Use in each nostril as directed    . benzonatate (TESSALON) 200 MG capsule TAKE ONE CAPSULE BY MOUTH 3 TIMES A DAY AS NEEDED FOR COUGH 30 capsule 5  . clopidogrel (PLAVIX) 75 MG tablet Take 75 mg by mouth daily.    . clorazepate (TRANXENE) 15 MG tablet Take 15 mg by mouth at bedtime. anxiety    . furosemide (LASIX) 40 MG tablet Take 20 mg by mouth every other day.    Marland Kitchen glimepiride (AMARYL) 1 MG tablet Take 1 mg by mouth daily before breakfast.    . Guaifenesin 1200 MG TB12 Take 1 tablet (1,200 mg total) by mouth 2 (two) times daily. 20 each 0  . ibuprofen (ADVIL,MOTRIN) 200 MG tablet Take 400 mg by mouth every 6 (six) hours as needed for headache or moderate pain.    Marland Kitchen levothyroxine (SYNTHROID, LEVOTHROID) 50 MCG tablet Take 25 mcg by mouth daily.     Marland Kitchen loperamide (IMODIUM A-D) 2 MG tablet Take 2 mg by mouth as needed for diarrhea or loose stools.     Marland Kitchen LORazepam (ATIVAN) 1 MG tablet Take 1 mg by mouth every 8 (eight) hours as needed for anxiety.    . metoprolol succinate (TOPROL-XL) 50 MG 24 hr tablet Take 100 mg by mouth daily.    . montelukast (SINGULAIR) 10 MG tablet Take 10 mg by mouth at bedtime.    . Multiple Vitamin (MULTIVITAMIN WITH MINERALS) TABS tablet Take 1 tablet by mouth daily.    . nitroGLYCERIN (NITROSTAT) 0.4 MG SL tablet Place 0.4 mg under the tongue every 5 (five) minutes as needed. For chest pain    .  omeprazole (PRILOSEC) 20 MG capsule Take 20-40 mg by mouth daily as needed (acid reflux).    . predniSONE (DELTASONE) 10 MG tablet 2 daily x 3 days, then one daily 20 tablet 0  . promethazine-dextromethorphan (PROMETHAZINE-DM) 6.25-15 MG/5ML syrup Take 5 mLs by mouth 4 (four) times daily as needed for cough. 120 mL 0  .  QUEtiapine (SEROQUEL) 25 MG tablet Take 50 mg by mouth daily with breakfast.    . rosuvastatin (CRESTOR) 20 MG tablet Take 20 mg by mouth at bedtime.     . topiramate (TOPAMAX) 25 MG tablet Take 25 mg by mouth at bedtime.    . traMADol (ULTRAM) 50 MG tablet Take 1 tablet (50 mg total) by mouth every 6 (six) hours as needed. 40 tablet 0  . Umeclidinium-Vilanterol (ANORO ELLIPTA) 62.5-25 MCG/INH AEPB Inhale 1 puff into the lungs daily. 60 each 5  . ZETIA 10 MG tablet Take 10 mg by mouth daily.  2   No current facility-administered medications for this visit.    Socially she is widowed with one child one grandchild. She with [name] son. No tobacco history.   ROS General: Negative; No fevers, chills, or night sweats;  HEENT: Negative; No changes in vision or hearing, sinus congestion, difficulty swallowing Pulmonary: Positive for asthma; no shortness of breath, hemoptysis Cardiovascular: Negative; No chest pain, presyncope, syncope, palpitations GI: Negative; No nausea, vomiting, diarrhea, or abdominal pain GU: Negative; No dysuria, hematuria, or difficulty voiding Musculoskeletal: Negative; no myalgias, joint pain, or weakness Hematologic/Oncology: Negative; no easy bruising, bleeding Endocrine: Positive for hypothyroidism and type 2 diabetes mellitus Neuro: Negative; no changes in balance, headaches Skin: Negative; No rashes or skin lesions Psychiatric: Negative; No behavioral problems, depression Sleep: Negative; No snoring, daytime sleepiness, hypersomnolence, bruxism, restless legs, hypnogognic hallucinations, no cataplexy Other comprehensive 14 point system review is  negative.    PE BP 108/70 mmHg  Pulse 93  Ht _0  (1.549 m)  Wt 183 lb (83.008 kg)  BMI 34.60 kg/m2   Repeat blood pressure 100/68  Wt Readings from Last 3 Encounters:  10/24/15 183 lb (83.008 kg)  09/26/15 189 lb 9.6 oz (86.002 kg)  08/05/15 191 lb 3.2 oz (86.728 kg)   General: Alert, oriented, no distress.  HEENT: Normocephalic, atraumatic. Pupils round and reactive; sclera anicteric;  Nose without nasal septal hypertrophy Mouth/Parynx benign; Mallinpatti scale 3 Neck: No JVD, no carotid bruits with normal carotid upstroke Lungs: clear to ausculatation and percussion; no wheezing or rales Heart: RRR, s1 s2 normal 1/6 systolic murmur; no diastolic murmur.  No rubs thrills or heaves. Abdomen: soft, nontender; no hepatosplenomehaly, BS+; abdominal aorta nontender and not dilated by palpation. Pulses 2+ Extremities: no clubbing cyanosis or edema, Homan's sign negative  Neurologic: grossly nonfocal Psychological: Normal affect and mood  ECG (independently read by me):  Sinus rhythm at 93 bpm with PAC.  Left bundle branch block with repolarization changes.  December 2015 ECG (independently read by me).  Normal sinus rhythm. Left Bundle branch block with repolarization changes compared to [name] prior tracing of one year ago.  Prior November 2014 ECG: Normal sinus rhythm at 79 beats per minute. Mild incomplete conduction delay. Normal intervals.  LABS: recent blood work by Dr. [name] from general 26 2017 was reviewed in detail with the patient.  BMET  BMP Latest Ref Rng 09/18/2015 07/18/2015 07/17/2015  Glucose 65 - 99 mg/dL 96 233(H) 209(H)  BUN 6 - 20 mg/dL 52(H) 28(H) 28(H)  Creatinine 0.44 - 1.00 mg/dL 1.47(H) 0.83 0.90  Sodium 135 - 145 mmol/L 141 143 138  Potassium 3.5 - 5.1 mmol/L 3.0(L) 4.0 3.6  Chloride 101 - 111 mmol/L 100(L) 112(H) 108  CO2 22 - 32 mmol/L 27 24 21(L)  Calcium 8.9 - 10.3 mg/dL 9.3 8.3(L) 8.0(L)   Hepatic Function Latest Ref Rng 07/16/2015  07/15/2015 08/04/2013  Total  Protein 6.5 - 8.1 g/dL 6.9 6.7 7.2  Albumin 3.5 - 5.0 g/dL 3.5 3.4(L) 3.7  AST 15 - 41 U/L 42(H) 26 28  ALT 14 - 54 U/L _0 Alk Phosphatase 38 - 126 U/L 50 51 68  Total Bilirubin 0.3 - 1.2 mg/dL 0.3 0.5 0.1(L)  Bilirubin, Direct 0.0 - 0.3 mg/dL - - <0.1   CBC Latest Ref Rng 09/18/2015 07/18/2015 07/18/2015  WBC 4.0 - 10.5 K/uL 5.9 - 7.4  Hemoglobin 12.0 - 15.0 g/dL 10.6(L) 9.9(L) 8.9(L)  Hematocrit 36.0 - 46.0 % 34.0(L) 31.6(L) 29.4(L)  Platelets 150 - 400 K/uL 241 - 180   Lab Results  Component Value Date   MCV 86.7 09/18/2015   MCV 84.7 07/18/2015   MCV 85.0 07/16/2015   Lab Results  Component Value Date   TSH 0.400 12/01/2012   Lipid Panel  No results found for: CHOL, TRIG, HDL, CHOLHDL, VLDL, LDLCALC, LDLDIRECT   RADIOLOGY: No results found.   ASSESSMENT AND PLAN: [name] is an 82 years old white female who is 17 years status post high-speed rotational atherectomy of a very calcified LAD.  She continues to be without anginal symptomatology.  She has been on chronic  antiplatelet therapy and continues to tolerate this well without bleeding.   An echo Doppler study November 2013 showed an ejection fraction greater than 46% normal systolic function with grade 1 diastolic dysfunction. She did have moderate aortic sclerosis without stenosis. There was mild mitral regurgitation and tricuspid regurgitation. A nuclear perfusion study November 2013 revealed normal perfusion without scar or ischemia. Post-rest ejection fraction was 73%.  [name] blood pressure today is low and recent blood work by Dr. [name] suggested some dehydration.  At that time [name] furosemide dose was reduced from 40 mg down to 20 mg.  I have suggested that she reduce this further to every other day particularly with [name] low blood pressure. I am recommending a follow-up echo Doppler study be obtained to reevaluate [name] systolic and diastolic function.  [name] resting pulse is 93.  On [name]  current dose of Toprol-XL 100 mg daily.  Since [name] blood pressure is low, I will not increase this further.  She has a history of hypothyroidism and is on Synthroid replacement and recent TSH level by Dr. Maudie Mercury was 0.490. She is on Zetia 10 mg and Crestor 20 mg for hyperlipidemia [name] recent LDL cholesterol was 61.  However, triglycerides were elevated further dietary adjustment was recommended. I will see [name] in 3 months for reevaluation and further recommendations will made at  that time.  Time spent: 25 minutes.  Troy Sine, MD, [facility]  10/24/2015 5:51 PM

## 2015-10-24 NOTE — Patient Instructions (Signed)
Your physician has recommended you make the following change in your medication:   1.) the furosemide has been changed to 20 mg every other day.  Your physician has requested that you have an echocardiogram. Echocardiography is a painless test that uses sound waves to create images of your heart. It provides your doctor with information about the size and shape of your heart and how well your heart's chambers and valves are working. This procedure takes approximately one hour. There are no restrictions for this procedure.  Your physician recommends that you schedule a follow-up appointment in: 3 months with Dr Claiborne Billings.

## 2015-10-31 ENCOUNTER — Encounter: Payer: Self-pay | Admitting: Internal Medicine

## 2015-10-31 ENCOUNTER — Ambulatory Visit (INDEPENDENT_AMBULATORY_CARE_PROVIDER_SITE_OTHER): Payer: Medicare Other | Admitting: Internal Medicine

## 2015-10-31 ENCOUNTER — Other Ambulatory Visit (INDEPENDENT_AMBULATORY_CARE_PROVIDER_SITE_OTHER): Payer: Medicare Other

## 2015-10-31 VITALS — BP 124/68 | HR 90 | Ht 61.0 in | Wt 184.2 lb

## 2015-10-31 DIAGNOSIS — E876 Hypokalemia: Secondary | ICD-10-CM | POA: Diagnosis not present

## 2015-10-31 LAB — BASIC METABOLIC PANEL
BUN: 32 mg/dL — AB (ref 6–23)
CO2: 32 meq/L (ref 19–32)
Calcium: 9.9 mg/dL (ref 8.4–10.5)
Chloride: 98 mEq/L (ref 96–112)
Creatinine, Ser: 1.39 mg/dL — ABNORMAL HIGH (ref 0.40–1.20)
GFR: 37.99 mL/min — AB (ref >60.00)
GLUCOSE: 89 mg/dL (ref 70–99)
POTASSIUM: 3.6 meq/L (ref 3.5–5.1)
SODIUM: 140 meq/L (ref 135–145)

## 2015-10-31 NOTE — Progress Notes (Signed)
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 Now describes persistent nasal congestion and feels that she has had a sinusitis since October, 2013. Asthma flare in December needing nebulizer treatments twice daily, Z-Pak, 2 or 3 weeks of prednisone and Augmentin. CT maxillofacial-mild sphenoid sinusitis and an incidental right carotid lesion for which she is seeing Dr Erik Obey soon. Remains short of breath and aware of weight gain. Wheeze in cold air. CAD/ Dr Claiborne Billings, valvular heart disease, rotablator procedure.  CXR 07/19/12 IMPRESSION:  No acute abnormality. Extensive coronary artery calcification.  Original Report Authenticated By: Lorriane Shire, M.D.   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 Dr Erik Obey had done CTmax/fac- left sphenoid air-fluid level. She says she has had "asthma" all winter, meaning shortness of breath and some chest tightness. Not relieved by Spiriva. Physically very inactive. She was unable to perform 6 minute walk test PFT: 10/10/2012-mild obstructive airways disease with insignificant response to bronchodilator. Air-trapping with increased residual volume. Diffusion mildly reduced. FEC 1.61/76%, 11.05/77%, FEV1/FVC 0.65, FEF 25-75% 0.56/33%. RV 134%, DLCO 76%.  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 Dr Erik Obey ENT saw raw area near larynx "cough and reflux". Wheezes with any exertion. Mostly dry cough. Proair 1x daily, nebulizer 2-3x/ week.  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 Denies any fever or chills.  Husband here              got flu vaccine from primary physician Describes some green sputum with cough. Following with ENT for some ulceration on vocal cord attributed to reflux and cough. CT of sinuses reported clear. Continues Anoro, pro-air, Singulair, omeprazole. CXR 01/20/15 IMPRESSION: No active cardiopulmonary disease. Electronically Signed  By: Kerby Moors M.D.  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  went to the ED on 1/29 and given pred 50mg  and prometh-cod syrup.  taking the tessalon regularly and would like additional refills.  denies nay tightness, hemoptyis Lies before Christmas with what she calls pneumonia. I don't find chest x-rays demonstrating pneumonia, just bronchitis changes. She spent 3 weeks in rehabilitation. Complains of persistent productive cough "dark gray" with no blood, chest pain, fever or adenopathy. Using nebulizer with albuterol 3 or 4 times daily. She feels she needs an antibiotic. CXR 09/18/2015- I reviewed images FINDINGS: The lungs are clear wiithout focal pneumonia, edema, pneumothorax or pleural effusion. Hyperexpansion noted. Interstitial markings are diffusely coarsened with chronic features. Minimal fullness right hilum stable compared to multiple prior studies. The cardiopericardial silhouette is within  normal limits for size.  The visualized bony structures of the thorax are intact. IMPRESSION: No active cardiopulmonary disease. Electronically Signed  By: Misty Stanley M.D.  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 Pt has cough-non productive; feels as though phelgm is caught in throat area-can not get up; dry mouth all the time. Head and sinus area feels dry per patient. CBC 09/18/2015-hemoglobin 10.6, low but best she's had in 3 months Potassium was low 3.0 with BUNs 52, creatinine 1.47 Saw Dr. Claiborne Billings on 10/24/2015 for cardiology follow-up reduced her Lasix to every other day and planned echocardiogram  ROS-see HPI Constitutional:   No-   weight loss, night sweats, fevers, chills, fatigue, lassitude. HEENT:   No-  headaches, difficulty swallowing, tooth/dental problems, sore throat,       No-  sneezing, itching, ear ache, +nasal congestion, +post nasal drip,  CV:  No-   chest pain, orthopnea, PND, swelling in lower extremities, anasarca,                                                        dizziness, palpitations Resp:+ shortness of breath with exertion or at rest.              No-   productive cough,  No non-productive cough,  No- coughing up of blood.              No-   change in color of mucus.  + wheezing.   Skin: No-   rash or lesions. GI:  No-   heartburn, indigestion, abdominal pain, nausea, vomiting,  GU:  MS:  No-   joint pain or swelling.   Neuro-     nothing unusual Psych:  No- change in mood or affect. No depression or anxiety.  No memory loss.  OBJ- Physical Exam General- Alert, Oriented, Affect-appropriate, Distress- none acute, + obese, + elderly Skin- rash-none, lesions- none, excoriation- none Lymphadenopathy- none Head-  atraumatic            Eyes- Gross vision intact, PERRLA, conjunctivae and secretions clear            Ears- Hearing, canals-normal            Nose- Clear, no-Septal dev, mucus, polyps, erosion, perforation             Throat- Mallampati III , mucosa clear , drainage- none, tonsils- atrophic, + broken tooth Neck- flexible , trachea midline, no stridor , thyroid nl, carotid no bruit Chest - symmetrical excursion , unlabored           Heart/CV- RRR , no murmur heard , no gallop  , no rub, nl s1 s2                           - JVD- none , edema- none, stasis changes- none, varices- none           Lung- unlabored, wheeze+trace, cough-+ light , dullness-none, rub- none.            Chest wall-  Abd-  Br/ Gen/ Rectal- Not done, not indicated Extrem- cyanosis- none, clubbing, none, atrophy-  none, strength- nl. + Walker Neuro- grossly intact to observation

## 2015-10-31 NOTE — Patient Instructions (Signed)
Order- BMET      Dx Hypokalemia  Please follow- up with Dr Maudie Mercury. It may help to reduce your anxiety meds a little to help get rid of the foggy feeling you describe today.

## 2015-11-01 DIAGNOSIS — F419 Anxiety disorder, unspecified: Secondary | ICD-10-CM | POA: Diagnosis not present

## 2015-11-01 DIAGNOSIS — E039 Hypothyroidism, unspecified: Secondary | ICD-10-CM | POA: Diagnosis not present

## 2015-11-01 DIAGNOSIS — E119 Type 2 diabetes mellitus without complications: Secondary | ICD-10-CM | POA: Diagnosis not present

## 2015-11-01 DIAGNOSIS — I10 Essential (primary) hypertension: Secondary | ICD-10-CM | POA: Diagnosis not present

## 2015-11-03 ENCOUNTER — Other Ambulatory Visit: Payer: Self-pay | Admitting: Cardiovascular Disease

## 2015-11-03 DIAGNOSIS — I350 Nonrheumatic aortic (valve) stenosis: Secondary | ICD-10-CM

## 2015-11-08 ENCOUNTER — Other Ambulatory Visit: Payer: Self-pay

## 2015-11-08 ENCOUNTER — Ambulatory Visit (HOSPITAL_COMMUNITY): Payer: Medicare Other | Attending: Cardiovascular Disease

## 2015-11-08 DIAGNOSIS — I10 Essential (primary) hypertension: Secondary | ICD-10-CM | POA: Diagnosis not present

## 2015-11-08 DIAGNOSIS — E785 Hyperlipidemia, unspecified: Secondary | ICD-10-CM | POA: Insufficient documentation

## 2015-11-08 DIAGNOSIS — E039 Hypothyroidism, unspecified: Secondary | ICD-10-CM | POA: Insufficient documentation

## 2015-11-08 DIAGNOSIS — E119 Type 2 diabetes mellitus without complications: Secondary | ICD-10-CM | POA: Insufficient documentation

## 2015-11-08 DIAGNOSIS — I447 Left bundle-branch block, unspecified: Secondary | ICD-10-CM | POA: Diagnosis not present

## 2015-11-08 DIAGNOSIS — I350 Nonrheumatic aortic (valve) stenosis: Secondary | ICD-10-CM

## 2015-11-28 DIAGNOSIS — L603 Nail dystrophy: Secondary | ICD-10-CM | POA: Diagnosis not present

## 2015-11-28 DIAGNOSIS — E1151 Type 2 diabetes mellitus with diabetic peripheral angiopathy without gangrene: Secondary | ICD-10-CM | POA: Diagnosis not present

## 2015-11-28 DIAGNOSIS — I739 Peripheral vascular disease, unspecified: Secondary | ICD-10-CM | POA: Diagnosis not present

## 2015-11-29 DIAGNOSIS — K5900 Constipation, unspecified: Secondary | ICD-10-CM | POA: Diagnosis not present

## 2015-11-29 DIAGNOSIS — F419 Anxiety disorder, unspecified: Secondary | ICD-10-CM | POA: Diagnosis not present

## 2015-12-16 ENCOUNTER — Encounter (HOSPITAL_COMMUNITY): Payer: Self-pay | Admitting: *Deleted

## 2015-12-16 ENCOUNTER — Emergency Department (HOSPITAL_COMMUNITY)
Admission: EM | Admit: 2015-12-16 | Discharge: 2015-12-17 | Disposition: A | Discharge status: Home / Self Care | Payer: Medicare Other | Attending: Emergency Medicine | Admitting: Emergency Medicine

## 2015-12-16 ENCOUNTER — Emergency Department (HOSPITAL_COMMUNITY): Payer: Medicare Other

## 2015-12-16 DIAGNOSIS — M1990 Unspecified osteoarthritis, unspecified site: Secondary | ICD-10-CM | POA: Insufficient documentation

## 2015-12-16 DIAGNOSIS — Z7982 Long term (current) use of aspirin: Secondary | ICD-10-CM | POA: Diagnosis not present

## 2015-12-16 DIAGNOSIS — E119 Type 2 diabetes mellitus without complications: Secondary | ICD-10-CM | POA: Insufficient documentation

## 2015-12-16 DIAGNOSIS — E039 Hypothyroidism, unspecified: Secondary | ICD-10-CM | POA: Insufficient documentation

## 2015-12-16 DIAGNOSIS — K219 Gastro-esophageal reflux disease without esophagitis: Secondary | ICD-10-CM | POA: Insufficient documentation

## 2015-12-16 DIAGNOSIS — Z7951 Long term (current) use of inhaled steroids: Secondary | ICD-10-CM | POA: Insufficient documentation

## 2015-12-16 DIAGNOSIS — M8580 Other specified disorders of bone density and structure, unspecified site: Secondary | ICD-10-CM | POA: Insufficient documentation

## 2015-12-16 DIAGNOSIS — R1310 Dysphagia, unspecified: Secondary | ICD-10-CM | POA: Diagnosis not present

## 2015-12-16 DIAGNOSIS — I10 Essential (primary) hypertension: Secondary | ICD-10-CM | POA: Insufficient documentation

## 2015-12-16 DIAGNOSIS — Z7984 Long term (current) use of oral hypoglycemic drugs: Secondary | ICD-10-CM | POA: Diagnosis not present

## 2015-12-16 DIAGNOSIS — R221 Localized swelling, mass and lump, neck: Secondary | ICD-10-CM | POA: Diagnosis not present

## 2015-12-16 DIAGNOSIS — I2510 Atherosclerotic heart disease of native coronary artery without angina pectoris: Secondary | ICD-10-CM | POA: Insufficient documentation

## 2015-12-16 DIAGNOSIS — Z79891 Long term (current) use of opiate analgesic: Secondary | ICD-10-CM | POA: Insufficient documentation

## 2015-12-16 DIAGNOSIS — Z791 Long term (current) use of non-steroidal anti-inflammatories (NSAID): Secondary | ICD-10-CM | POA: Insufficient documentation

## 2015-12-16 DIAGNOSIS — Z79899 Other long term (current) drug therapy: Secondary | ICD-10-CM | POA: Insufficient documentation

## 2015-12-16 DIAGNOSIS — M542 Cervicalgia: Secondary | ICD-10-CM | POA: Diagnosis not present

## 2015-12-16 DIAGNOSIS — Z7901 Long term (current) use of anticoagulants: Secondary | ICD-10-CM | POA: Insufficient documentation

## 2015-12-16 DIAGNOSIS — Z7952 Long term (current) use of systemic steroids: Secondary | ICD-10-CM | POA: Insufficient documentation

## 2015-12-16 DIAGNOSIS — J45909 Unspecified asthma, uncomplicated: Secondary | ICD-10-CM | POA: Insufficient documentation

## 2015-12-16 NOTE — ED Provider Notes (Signed)
CSN: [id number]     Arrival date & time 12/16/15  [id number] History  By signing my name below, I, Doran Stabler, attest that this documentation has been prepared under the direction and in the presence of Ripley Fraise, MD. Electronically Signed: Doran Stabler, ED Scribe. 12/17/2015. 11:43 PM.  Chief Complaint  Patient presents with  . Dysphagia   The history is provided by the patient. No language interpreter was used.   HPI Comments: [name] is a 82 y.o. female who presents to the Emergency Department with a PMHx of DM, HTN, asthma, pneumonia (11/16) and bronchitis complaining of intermittent right-sided sore throat ongoing for 2 months that worsened today. Pt also reports trouble swallowing secondary to her symptoms with increased coughing. Pt denies any fevers, vomiting, CP, abdominal pain, or any other symptoms at this time.   Past Medical History  Diagnosis Date  . Asthma   . Coronary artery disease   . Diabetes mellitus without complication ([place])   . Hypothyroidism   . GERD (gastroesophageal reflux disease)   . Insomnia   . Anxiety   . Hypertension   . CAD (coronary artery disease)   . Osteopenia   . Osteoarthritis   . H/O echocardiogram 06-26-2012    EF 55%  . H/O cardiovascular stress test 06-26-2012    compared to previous study there is no significant change; normal myocardial perfusion study. no significant ischemia demonstrated . this is a low risk scan  . History of cardiac cath 09-30-1998    cath done by Dr. Claiborne Billings, successful rotation atherectomy of the left anterior decending artert with reduction from 80 to105  . Hx of cardiac cath 09-26-1998    dignostic cath dr. Claiborne Billings to do rotoblator at a later date  . History of Doppler ultrasound 03-30-2008    neg abd. doppler for aneurysm  . History of Holter monitoring 12-22-2007    NSR with pac occs. pvc's   Past Surgical History  Procedure Laterality Date  . Appendectomy  1947  . Dilation and curettage of uterus  1960   . Vesicovaginal fistula closure w/ tah  1971    Dr Mallie Mussel  . Bladder tack  1970    Dr Rosana Hoes  . Breast lumpectomy  1970    Left, Dr Leandrew Koyanagi  . Nasal sinus surgery  1980    Left; for chronic sinusitis  . Revision total knee arthroplasty  2005    Dr Alvan Dame Right knee   Family History  Problem Relation Age of Onset  . Diabetes Father   . Parkinson's disease Father   . Heart disease Father   . Asthma Father   . Diabetes Mother   . Heart disease Mother   . Heart disease Brother   . Diabetes Brother   . Diabetes Brother   . Diabetes Brother    Social History  Substance Use Topics  . Smoking status: Never Smoker   . Smokeless tobacco: Never Used  . Alcohol Use: No   OB History    No data available     Review of Systems  Constitutional: Negative for fever.  HENT: Positive for sore throat and trouble swallowing.   Respiratory: Positive for cough.   Cardiovascular: Negative for chest pain.  Gastrointestinal: Negative for vomiting and abdominal pain.  All other systems reviewed and are negative.  Allergies  Review of patient's allergies indicates no known allergies.  Home Medications   Prior to Admission medications   Medication Sig Start Date End Date Taking?  Authorizing Provider  acetaminophen-codeine (TYLENOL #3) 300-30 MG tablet Take 1 tablet by mouth every 8 (eight) hours as needed for moderate pain.   Yes Historical Provider, MD  albuterol (PROVENTIL HFA;VENTOLIN HFA) 108 (90 Base) MCG/ACT inhaler Inhale 2 puffs into the lungs every 6 (six) hours as needed for wheezing or shortness of breath.   Yes Historical Provider, MD  albuterol (PROVENTIL) (2.5 MG/3ML) 0.083% nebulizer solution Take 3 mLs (2.5 mg total) by nebulization every 6 (six) hours as needed for wheezing. 05/02/15  Yes Deneise Lever, MD  aspirin EC 81 MG tablet Take 81 mg by mouth at bedtime.    Yes Historical Provider, MD  azelastine (ASTELIN) 137 MCG/SPRAY nasal spray Place 1 spray into the nose 2 (two)  times daily. Use in each nostril as directed   Yes Historical Provider, MD  benzonatate (TESSALON) 200 MG capsule TAKE ONE CAPSULE BY MOUTH 3 TIMES A DAY AS NEEDED FOR COUGH 09/22/15  Yes Deneise Lever, MD  clopidogrel (PLAVIX) 75 MG tablet Take 75 mg by mouth every evening.    Yes Historical Provider, MD  clorazepate (TRANXENE) 15 MG tablet Take 15 mg by mouth at bedtime. anxiety   Yes Historical Provider, MD  furosemide (LASIX) 40 MG tablet Take 20 mg by mouth every other day.   Yes Historical Provider, MD  glimepiride (AMARYL) 1 MG tablet Take 1 mg by mouth daily before breakfast.   Yes Historical Provider, MD  ibuprofen (ADVIL,MOTRIN) 200 MG tablet Take 400 mg by mouth every 6 (six) hours as needed for headache or moderate pain.   Yes Historical Provider, MD  levothyroxine (SYNTHROID, LEVOTHROID) 50 MCG tablet Take 25 mcg by mouth daily.    Yes Historical Provider, MD  LORazepam (ATIVAN) 1 MG tablet Take 0.5-1 mg by mouth every 8 (eight) hours as needed for anxiety.    Yes Historical Provider, MD  metoprolol succinate (TOPROL-XL) 50 MG 24 hr tablet Take 100 mg by mouth daily. 09/13/15  Yes Historical Provider, MD  montelukast (SINGULAIR) 10 MG tablet Take 10 mg by mouth at bedtime.   Yes Historical Provider, MD  Multiple Vitamin (MULTIVITAMIN WITH MINERALS) TABS tablet Take 1 tablet by mouth daily.   Yes Historical Provider, MD  nitroGLYCERIN (NITROSTAT) 0.4 MG SL tablet Place 0.4 mg under the tongue every 5 (five) minutes as needed. For chest pain   Yes Historical Provider, MD  omeprazole (PRILOSEC) 20 MG capsule Take 20-40 mg by mouth daily as needed (acid reflux).   Yes Historical Provider, MD  QUEtiapine (SEROQUEL) 25 MG tablet Take 50 mg by mouth daily with breakfast.   Yes Historical Provider, MD  rosuvastatin (CRESTOR) 20 MG tablet Take 20 mg by mouth at bedtime.    Yes Historical Provider, MD  topiramate (TOPAMAX) 25 MG tablet Take 25 mg by mouth at bedtime.   Yes Historical Provider, MD   traMADol (ULTRAM) 50 MG tablet Take 1 tablet (50 mg total) by mouth every 6 (six) hours as needed. 09/26/15  Yes Deneise Lever, MD  Umeclidinium-Vilanterol (ANORO ELLIPTA) 62.5-25 MCG/INH AEPB Inhale 1 puff into the lungs daily. 01/27/15  Yes Deneise Lever, MD  ZETIA 10 MG tablet Take 10 mg by mouth daily. 08/15/15  Yes Historical Provider, MD  acetaminophen-codeine 120-12 MG/5ML solution Take 10 mLs by mouth every 4 (four) hours as needed (cough). Patient not taking: Reported on 12/17/2015 07/19/15   Donne Hazel, MD  Guaifenesin 1200 MG TB12 Take 1 tablet (1,200 mg total)  by mouth 2 (two) times daily. Patient not taking: Reported on 12/17/2015 09/18/15   Dalia Heading, PA-C  loperamide (IMODIUM A-D) 2 MG tablet Take 2 mg by mouth as needed for diarrhea or loose stools.     Historical Provider, MD  predniSONE (DELTASONE) 10 MG tablet 2 daily x 3 days, then one daily Patient not taking: Reported on 12/17/2015 09/26/15   Deneise Lever, MD  promethazine-dextromethorphan (PROMETHAZINE-DM) 6.25-15 MG/5ML syrup Take 5 mLs by mouth 4 (four) times daily as needed for cough. Patient not taking: Reported on 12/17/2015 09/18/15   Dalia Heading, PA-C   BP 117/44 mmHg  Pulse 75  Temp(Src) 97.9 F (36.6 C) (Oral)  Resp 18  Ht 5\' 1"  (1.549 m)  Wt 180 lb (81.647 kg)  BMI 34.03 kg/m2  SpO2 95%   Physical Exam CONSTITUTIONAL: Elderly, frail HEAD: Normocephalic/atraumatic EYES: EOMI ENMT: Mucous membranes moist, uvula midline, no erythema, no exudates, no stridor, no drooling  NECK: supple no meningeal signs, no anterior neck tenderness, crepitus, no edema SPINE/BACK:entire spine nontender CV: S1/S2 noted, no murmurs/rubs/gallops noted LUNGS: Lungs are clear to auscultation bilaterally, no apparent distress ABDOMEN: soft, nontender NEURO: Pt is awake/alert/appropriate, moves all extremitiesx4.  EXTREMITIES:  full ROM SKIN: warm, color normal PSYCH: no abnormalities of mood noted, alert and  oriented to situation  ED Course  Procedures  DIAGNOSTIC STUDIES: Oxygen Saturation is 96% on room air, normal by my interpretation.    COORDINATION OF CARE: 11:36 PM Will order x-ray of soft tissue neck. Discussed treatment plan with pt and son at bedside and they agreed to plan.   Xray negative Pt taking PO This appears to be a chronic process She reports this usually worsens when she has coughing spells Due to some sore throat Carafate was ordered Stable for d/c home Pt/family agreeable with plan  Imaging Review Dg Neck Soft Tissue  12/17/2015  CLINICAL DATA:  Sensation of object stuck in throat, worsening since October. Pain on swallowing. Initial encounter. EXAM: NECK SOFT TISSUES - 1+ VIEW COMPARISON:  Maxillofacial CT performed 07/19/2012 FINDINGS: The nasopharynx, oropharynx and hypopharynx are grossly unremarkable. The epiglottis is normal in thickness. Prevertebral soft tissues are within normal limits. Mild degenerative change is noted along the mid cervical spine, with small anterior and posterior osteophytes and minimal disc space narrowing. The visualized paranasal sinuses and mastoid air cells are well-aerated. The visualized lung apices are grossly clear. IMPRESSION: Unremarkable radiographs of the soft tissues of the neck. Electronically Signed   By: Garald Balding M.D.   On: 12/17/2015 00:47   I have personally reviewed and evaluated these images  results as part of my medical decision-making.    MDM   Final diagnoses:  Odynophagia    Nursing notes including past medical history and social history reviewed and considered in documentation xrays/imaging reviewed by myself and considered during evaluation    I personally performed the services described in this documentation, which was scribed in my presence. The recorded information has been reviewed and is accurate.        Ripley Fraise, MD 12/17/15 [phone]

## 2015-12-16 NOTE — ED Notes (Addendum)
Pt states she has a ? Ulcer on rt side of voice box, pt states she feels like she can not swallow. No drooling or speech deficit noted. Pt with multiple complaints.

## 2015-12-17 DIAGNOSIS — M542 Cervicalgia: Secondary | ICD-10-CM | POA: Diagnosis not present

## 2015-12-17 DIAGNOSIS — R221 Localized swelling, mass and lump, neck: Secondary | ICD-10-CM | POA: Diagnosis not present

## 2015-12-17 MED ORDER — SUCRALFATE 1 GM/10ML PO SUSP: 1.0000 g | Freq: Two times a day (BID) | ORAL | Status: DC

## 2015-12-17 NOTE — Discharge Instructions (Signed)

## 2015-12-28 DIAGNOSIS — E119 Type 2 diabetes mellitus without complications: Secondary | ICD-10-CM | POA: Diagnosis not present

## 2015-12-29 DIAGNOSIS — R05 Cough: Secondary | ICD-10-CM | POA: Diagnosis not present

## 2015-12-29 DIAGNOSIS — R0982 Postnasal drip: Secondary | ICD-10-CM | POA: Diagnosis not present

## 2015-12-29 DIAGNOSIS — K219 Gastro-esophageal reflux disease without esophagitis: Secondary | ICD-10-CM | POA: Diagnosis not present

## 2015-12-29 DIAGNOSIS — R059 Cough, unspecified: Secondary | ICD-10-CM | POA: Insufficient documentation

## 2016-01-02 DIAGNOSIS — E119 Type 2 diabetes mellitus without complications: Secondary | ICD-10-CM | POA: Diagnosis not present

## 2016-01-02 DIAGNOSIS — I10 Essential (primary) hypertension: Secondary | ICD-10-CM | POA: Diagnosis not present

## 2016-01-09 DIAGNOSIS — I10 Essential (primary) hypertension: Secondary | ICD-10-CM | POA: Diagnosis not present

## 2016-01-09 DIAGNOSIS — E119 Type 2 diabetes mellitus without complications: Secondary | ICD-10-CM | POA: Diagnosis not present

## 2016-01-09 DIAGNOSIS — R05 Cough: Secondary | ICD-10-CM | POA: Diagnosis not present

## 2016-01-11 ENCOUNTER — Emergency Department (HOSPITAL_COMMUNITY): Payer: Medicare Other

## 2016-01-11 ENCOUNTER — Encounter (HOSPITAL_COMMUNITY): Payer: Self-pay | Admitting: Emergency Medicine

## 2016-01-11 ENCOUNTER — Inpatient Hospital Stay (HOSPITAL_COMMUNITY)
Admission: EM | Admit: 2016-01-11 | Discharge: 2016-02-02 | DRG: 853 | Disposition: A | Discharge status: Skilled Nursing Facility | Payer: Medicare Other | Attending: Internal Medicine | Admitting: Internal Medicine

## 2016-01-11 DIAGNOSIS — E785 Hyperlipidemia, unspecified: Secondary | ICD-10-CM | POA: Diagnosis present

## 2016-01-11 DIAGNOSIS — E038 Other specified hypothyroidism: Secondary | ICD-10-CM | POA: Diagnosis not present

## 2016-01-11 DIAGNOSIS — Z7982 Long term (current) use of aspirin: Secondary | ICD-10-CM

## 2016-01-11 DIAGNOSIS — I5032 Chronic diastolic (congestive) heart failure: Secondary | ICD-10-CM | POA: Diagnosis present

## 2016-01-11 DIAGNOSIS — F419 Anxiety disorder, unspecified: Secondary | ICD-10-CM | POA: Diagnosis present

## 2016-01-11 DIAGNOSIS — J45909 Unspecified asthma, uncomplicated: Secondary | ICD-10-CM | POA: Diagnosis present

## 2016-01-11 DIAGNOSIS — K654 Sclerosing mesenteritis: Secondary | ICD-10-CM | POA: Diagnosis not present

## 2016-01-11 DIAGNOSIS — J9620 Acute and chronic respiratory failure, unspecified whether with hypoxia or hypercapnia: Secondary | ICD-10-CM

## 2016-01-11 DIAGNOSIS — T4145XA Adverse effect of unspecified anesthetic, initial encounter: Secondary | ICD-10-CM | POA: Diagnosis not present

## 2016-01-11 DIAGNOSIS — E876 Hypokalemia: Secondary | ICD-10-CM | POA: Diagnosis present

## 2016-01-11 DIAGNOSIS — R32 Unspecified urinary incontinence: Secondary | ICD-10-CM | POA: Diagnosis not present

## 2016-01-11 DIAGNOSIS — Z825 Family history of asthma and other chronic lower respiratory diseases: Secondary | ICD-10-CM | POA: Diagnosis not present

## 2016-01-11 DIAGNOSIS — I447 Left bundle-branch block, unspecified: Secondary | ICD-10-CM | POA: Diagnosis present

## 2016-01-11 DIAGNOSIS — E440 Moderate protein-calorie malnutrition: Secondary | ICD-10-CM | POA: Diagnosis present

## 2016-01-11 DIAGNOSIS — Z96651 Presence of right artificial knee joint: Secondary | ICD-10-CM | POA: Diagnosis present

## 2016-01-11 DIAGNOSIS — E119 Type 2 diabetes mellitus without complications: Secondary | ICD-10-CM

## 2016-01-11 DIAGNOSIS — I2510 Atherosclerotic heart disease of native coronary artery without angina pectoris: Secondary | ICD-10-CM | POA: Diagnosis present

## 2016-01-11 DIAGNOSIS — I130 Hypertensive heart and chronic kidney disease with heart failure and stage 1 through stage 4 chronic kidney disease, or unspecified chronic kidney disease: Secondary | ICD-10-CM | POA: Diagnosis not present

## 2016-01-11 DIAGNOSIS — Z5331 Laparoscopic surgical procedure converted to open procedure: Secondary | ICD-10-CM

## 2016-01-11 DIAGNOSIS — J9621 Acute and chronic respiratory failure with hypoxia: Secondary | ICD-10-CM | POA: Diagnosis present

## 2016-01-11 DIAGNOSIS — K219 Gastro-esophageal reflux disease without esophagitis: Secondary | ICD-10-CM | POA: Diagnosis present

## 2016-01-11 DIAGNOSIS — E1122 Type 2 diabetes mellitus with diabetic chronic kidney disease: Secondary | ICD-10-CM | POA: Diagnosis present

## 2016-01-11 DIAGNOSIS — N179 Acute kidney failure, unspecified: Secondary | ICD-10-CM | POA: Diagnosis present

## 2016-01-11 DIAGNOSIS — M6281 Muscle weakness (generalized): Secondary | ICD-10-CM | POA: Diagnosis not present

## 2016-01-11 DIAGNOSIS — Z9049 Acquired absence of other specified parts of digestive tract: Secondary | ICD-10-CM

## 2016-01-11 DIAGNOSIS — N390 Urinary tract infection, site not specified: Secondary | ICD-10-CM

## 2016-01-11 DIAGNOSIS — J90 Pleural effusion, not elsewhere classified: Secondary | ICD-10-CM | POA: Diagnosis not present

## 2016-01-11 DIAGNOSIS — R262 Difficulty in walking, not elsewhere classified: Secondary | ICD-10-CM | POA: Diagnosis not present

## 2016-01-11 DIAGNOSIS — Z7902 Long term (current) use of antithrombotics/antiplatelets: Secondary | ICD-10-CM

## 2016-01-11 DIAGNOSIS — I10 Essential (primary) hypertension: Secondary | ICD-10-CM | POA: Diagnosis present

## 2016-01-11 DIAGNOSIS — K2970 Gastritis, unspecified, without bleeding: Secondary | ICD-10-CM | POA: Diagnosis not present

## 2016-01-11 DIAGNOSIS — R330 Drug induced retention of urine: Secondary | ICD-10-CM | POA: Diagnosis not present

## 2016-01-11 DIAGNOSIS — R54 Age-related physical debility: Secondary | ICD-10-CM | POA: Diagnosis present

## 2016-01-11 DIAGNOSIS — K56609 Unspecified intestinal obstruction, unspecified as to partial versus complete obstruction: Secondary | ICD-10-CM

## 2016-01-11 DIAGNOSIS — K5669 Other intestinal obstruction: Secondary | ICD-10-CM | POA: Diagnosis not present

## 2016-01-11 DIAGNOSIS — Z5189 Encounter for other specified aftercare: Secondary | ICD-10-CM | POA: Diagnosis not present

## 2016-01-11 DIAGNOSIS — Z6837 Body mass index (BMI) 37.0-37.9, adult: Secondary | ICD-10-CM

## 2016-01-11 DIAGNOSIS — D62 Acute posthemorrhagic anemia: Secondary | ICD-10-CM | POA: Diagnosis not present

## 2016-01-11 DIAGNOSIS — R1110 Vomiting, unspecified: Secondary | ICD-10-CM

## 2016-01-11 DIAGNOSIS — Z01810 Encounter for preprocedural cardiovascular examination: Secondary | ICD-10-CM

## 2016-01-11 DIAGNOSIS — K521 Toxic gastroenteritis and colitis: Secondary | ICD-10-CM | POA: Diagnosis not present

## 2016-01-11 DIAGNOSIS — E870 Hyperosmolality and hypernatremia: Secondary | ICD-10-CM | POA: Diagnosis not present

## 2016-01-11 DIAGNOSIS — N184 Chronic kidney disease, stage 4 (severe): Secondary | ICD-10-CM | POA: Diagnosis present

## 2016-01-11 DIAGNOSIS — G8918 Other acute postprocedural pain: Secondary | ICD-10-CM

## 2016-01-11 DIAGNOSIS — R0602 Shortness of breath: Secondary | ICD-10-CM | POA: Diagnosis not present

## 2016-01-11 DIAGNOSIS — J4520 Mild intermittent asthma, uncomplicated: Secondary | ICD-10-CM | POA: Diagnosis present

## 2016-01-11 DIAGNOSIS — R6520 Severe sepsis without septic shock: Secondary | ICD-10-CM | POA: Diagnosis not present

## 2016-01-11 DIAGNOSIS — F329 Major depressive disorder, single episode, unspecified: Secondary | ICD-10-CM | POA: Diagnosis present

## 2016-01-11 DIAGNOSIS — K565 Intestinal adhesions [bands] with obstruction (postprocedural) (postinfection): Secondary | ICD-10-CM | POA: Diagnosis present

## 2016-01-11 DIAGNOSIS — K567 Ileus, unspecified: Secondary | ICD-10-CM | POA: Diagnosis not present

## 2016-01-11 DIAGNOSIS — T361X5A Adverse effect of cephalosporins and other beta-lactam antibiotics, initial encounter: Secondary | ICD-10-CM | POA: Diagnosis not present

## 2016-01-11 DIAGNOSIS — K5660 Unspecified intestinal obstruction: Secondary | ICD-10-CM | POA: Diagnosis not present

## 2016-01-11 DIAGNOSIS — I959 Hypotension, unspecified: Secondary | ICD-10-CM | POA: Diagnosis present

## 2016-01-11 DIAGNOSIS — R109 Unspecified abdominal pain: Secondary | ICD-10-CM

## 2016-01-11 DIAGNOSIS — J189 Pneumonia, unspecified organism: Secondary | ICD-10-CM | POA: Diagnosis present

## 2016-01-11 DIAGNOSIS — E860 Dehydration: Secondary | ICD-10-CM | POA: Diagnosis present

## 2016-01-11 DIAGNOSIS — K920 Hematemesis: Secondary | ICD-10-CM | POA: Diagnosis not present

## 2016-01-11 DIAGNOSIS — Z7984 Long term (current) use of oral hypoglycemic drugs: Secondary | ICD-10-CM | POA: Diagnosis not present

## 2016-01-11 DIAGNOSIS — Z4682 Encounter for fitting and adjustment of non-vascular catheter: Secondary | ICD-10-CM | POA: Diagnosis not present

## 2016-01-11 DIAGNOSIS — J42 Unspecified chronic bronchitis: Secondary | ICD-10-CM | POA: Diagnosis present

## 2016-01-11 DIAGNOSIS — E118 Type 2 diabetes mellitus with unspecified complications: Secondary | ICD-10-CM | POA: Insufficient documentation

## 2016-01-11 DIAGNOSIS — R509 Fever, unspecified: Secondary | ICD-10-CM

## 2016-01-11 DIAGNOSIS — E039 Hypothyroidism, unspecified: Secondary | ICD-10-CM | POA: Diagnosis present

## 2016-01-11 DIAGNOSIS — Z09 Encounter for follow-up examination after completed treatment for conditions other than malignant neoplasm: Secondary | ICD-10-CM

## 2016-01-11 DIAGNOSIS — A419 Sepsis, unspecified organism: Secondary | ICD-10-CM | POA: Diagnosis not present

## 2016-01-11 DIAGNOSIS — J441 Chronic obstructive pulmonary disease with (acute) exacerbation: Secondary | ICD-10-CM | POA: Diagnosis not present

## 2016-01-11 LAB — URINALYSIS, ROUTINE W REFLEX MICROSCOPIC
GLUCOSE, UA: NEGATIVE mg/dL
HGB URINE DIPSTICK: NEGATIVE
Ketones, ur: NEGATIVE mg/dL
NITRITE: NEGATIVE
PH: 5 (ref 5.0–8.0)
PROTEIN: NEGATIVE mg/dL
Specific Gravity, Urine: 1.025 (ref 1.005–1.030)

## 2016-01-11 LAB — BLOOD GAS, ARTERIAL
ACID-BASE DEFICIT: 1 mmol/L (ref 0.0–2.0)
BICARBONATE: 22.4 meq/L (ref 20.0–24.0)
DRAWN BY: 10552
O2 CONTENT: 2 L/min
O2 SAT: 96.9 %
Patient temperature: 98.6
TCO2: 23.4 mmol/L (ref 0–100)
pCO2 arterial: 32.1 mmHg — ABNORMAL LOW (ref 35.0–45.0)
pH, Arterial: 7.457 — ABNORMAL HIGH (ref 7.350–7.450)
pO2, Arterial: 101 mmHg — ABNORMAL HIGH (ref 80.0–100.0)

## 2016-01-11 LAB — CBC WITH DIFFERENTIAL/PLATELET
BASOS ABS: 0 10*3/uL (ref 0.0–0.1)
BASOS ABS: 0 10*3/uL (ref 0.0–0.1)
Basophils Relative: 0 %
Basophils Relative: 0 %
EOS ABS: 0 10*3/uL (ref 0.0–0.7)
EOS ABS: 0 10*3/uL (ref 0.0–0.7)
EOS PCT: 0 %
Eosinophils Relative: 0 %
HCT: 35.2 % — ABNORMAL LOW (ref 36.0–46.0)
HCT: 32.9 % — ABNORMAL LOW (ref 36.0–46.0)
Hemoglobin: 10.9 g/dL — ABNORMAL LOW (ref 12.0–15.0)
Hemoglobin: 10.4 g/dL — ABNORMAL LOW (ref 12.0–15.0)
LYMPHS ABS: 1 10*3/uL (ref 0.7–4.0)
LYMPHS ABS: 1.1 10*3/uL (ref 0.7–4.0)
LYMPHS PCT: 7 %
Lymphocytes Relative: 10 %
MCH: 25 pg — ABNORMAL LOW (ref 26.0–34.0)
MCH: 25.1 pg — AB (ref 26.0–34.0)
MCHC: 31 g/dL (ref 30.0–36.0)
MCHC: 31.6 g/dL (ref 30.0–36.0)
MCV: 80.7 fL (ref 78.0–100.0)
MCV: 79.3 fL (ref 78.0–100.0)
MONO ABS: 2.5 10*3/uL — AB (ref 0.1–1.0)
MONO ABS: 2.3 10*3/uL — AB (ref 0.1–1.0)
MONOS PCT: 18 %
Monocytes Relative: 22 %
Neutro Abs: 10.3 10*3/uL — ABNORMAL HIGH (ref 1.7–7.7)
Neutro Abs: 7.3 10*3/uL (ref 1.7–7.7)
Neutrophils Relative %: 75 %
Neutrophils Relative %: 68 %
PLATELETS: 292 10*3/uL (ref 150–400)
PLATELETS: 256 10*3/uL (ref 150–400)
RBC: 4.36 MIL/uL (ref 3.87–5.11)
RBC: 4.15 MIL/uL (ref 3.87–5.11)
RDW: 15.3 % (ref 11.5–15.5)
RDW: 15.1 % (ref 11.5–15.5)
WBC: 13.8 10*3/uL — ABNORMAL HIGH (ref 4.0–10.5)
WBC: 10.7 10*3/uL — AB (ref 4.0–10.5)

## 2016-01-11 LAB — URINE MICROSCOPIC-ADD ON

## 2016-01-11 LAB — COMPREHENSIVE METABOLIC PANEL
ALT: 16 U/L (ref 14–54)
AST: 19 U/L (ref 15–41)
Albumin: 2.7 g/dL — ABNORMAL LOW (ref 3.5–5.0)
Alkaline Phosphatase: 42 U/L (ref 38–126)
Anion gap: 11 (ref 5–15)
BUN: 90 mg/dL — ABNORMAL HIGH (ref 6–20)
CHLORIDE: 104 mmol/L (ref 101–111)
CO2: 22 mmol/L (ref 22–32)
CREATININE: 2.07 mg/dL — AB (ref 0.44–1.00)
Calcium: 8.3 mg/dL — ABNORMAL LOW (ref 8.9–10.3)
GFR calc non Af Amer: 20 mL/min — ABNORMAL LOW (ref >60)
GFR, EST AFRICAN AMERICAN: 23 mL/min — AB (ref >60)
Glucose, Bld: 198 mg/dL — ABNORMAL HIGH (ref 65–99)
POTASSIUM: 3 mmol/L — AB (ref 3.5–5.1)
SODIUM: 137 mmol/L (ref 135–145)
Total Bilirubin: 0.4 mg/dL (ref 0.3–1.2)
Total Protein: 5.7 g/dL — ABNORMAL LOW (ref 6.5–8.1)

## 2016-01-11 LAB — I-STAT CHEM 8, ED
BUN: 92 mg/dL — ABNORMAL HIGH (ref 6–20)
CREATININE: 2.6 mg/dL — AB (ref 0.44–1.00)
Calcium, Ion: 1.09 mmol/L — ABNORMAL LOW (ref 1.13–1.30)
Chloride: 100 mmol/L — ABNORMAL LOW (ref 101–111)
Glucose, Bld: 201 mg/dL — ABNORMAL HIGH (ref 65–99)
HEMATOCRIT: 36 % (ref 36.0–46.0)
HEMOGLOBIN: 12.2 g/dL (ref 12.0–15.0)
Potassium: 3.5 mmol/L (ref 3.5–5.1)
SODIUM: 140 mmol/L (ref 135–145)
TCO2: 25 mmol/L (ref 0–100)

## 2016-01-11 LAB — HEPATIC FUNCTION PANEL
ALT: 17 U/L (ref 14–54)
AST: 20 U/L (ref 15–41)
Albumin: 3 g/dL — ABNORMAL LOW (ref 3.5–5.0)
Alkaline Phosphatase: 43 U/L (ref 38–126)
BILIRUBIN TOTAL: 0.5 mg/dL (ref 0.3–1.2)
Total Protein: 6.2 g/dL — ABNORMAL LOW (ref 6.5–8.1)

## 2016-01-11 LAB — BASIC METABOLIC PANEL
ANION GAP: 15 (ref 5–15)
BUN: 97 mg/dL — ABNORMAL HIGH (ref 6–20)
CALCIUM: 9.1 mg/dL (ref 8.9–10.3)
CO2: 24 mmol/L (ref 22–32)
Chloride: 98 mmol/L — ABNORMAL LOW (ref 101–111)
Creatinine, Ser: 2.49 mg/dL — ABNORMAL HIGH (ref 0.44–1.00)
GFR, EST AFRICAN AMERICAN: 19 mL/min — AB (ref >60)
GFR, EST NON AFRICAN AMERICAN: 16 mL/min — AB (ref >60)
GLUCOSE: 210 mg/dL — AB (ref 65–99)
POTASSIUM: 3.5 mmol/L (ref 3.5–5.1)
SODIUM: 137 mmol/L (ref 135–145)

## 2016-01-11 LAB — I-STAT CG4 LACTIC ACID, ED
LACTIC ACID, VENOUS: 1.9 mmol/L (ref 0.5–2.0)
Lactic Acid, Venous: 1.08 mmol/L (ref 0.5–2.0)

## 2016-01-11 LAB — LACTIC ACID, PLASMA
LACTIC ACID, VENOUS: 1.5 mmol/L (ref 0.5–2.0)
LACTIC ACID, VENOUS: 1.2 mmol/L (ref 0.5–2.0)

## 2016-01-11 LAB — PROTIME-INR
INR: 1.34 (ref 0.00–1.49)
PROTHROMBIN TIME: 16.7 s — AB (ref 11.6–15.2)

## 2016-01-11 LAB — APTT: APTT: 30 s (ref 24–37)

## 2016-01-11 LAB — MRSA PCR SCREENING: MRSA BY PCR: NEGATIVE

## 2016-01-11 LAB — I-STAT TROPONIN, ED: TROPONIN I, POC: 0.05 ng/mL (ref 0.00–0.08)

## 2016-01-11 LAB — PROCALCITONIN: PROCALCITONIN: 0.26 ng/mL

## 2016-01-11 MED ORDER — PANTOPRAZOLE SODIUM 40 MG PO TBEC
40.0000 mg | DELAYED_RELEASE_TABLET | Freq: Every day | ORAL | Status: DC
  Administered 2016-01-11 – 2016-01-12 (×2): 40 mg via ORAL
  Filled 2016-01-11 (×2): qty 1

## 2016-01-11 MED ORDER — INSULIN ASPART 100 UNIT/ML ~~LOC~~ SOLN
0.0000 [IU] | Freq: Three times a day (TID) | SUBCUTANEOUS | Status: DC
  Administered 2016-01-12: 3 [IU] via SUBCUTANEOUS
  Administered 2016-01-12: 2 [IU] via SUBCUTANEOUS
  Administered 2016-01-13: 3 [IU] via SUBCUTANEOUS
  Administered 2016-01-13 – 2016-01-14 (×2): 2 [IU] via SUBCUTANEOUS
  Administered 2016-01-14 (×2): 1 [IU] via SUBCUTANEOUS
  Administered 2016-01-15: 2 [IU] via SUBCUTANEOUS
  Administered 2016-01-15: 1 [IU] via SUBCUTANEOUS
  Administered 2016-01-16 (×2): 2 [IU] via SUBCUTANEOUS
  Administered 2016-01-17: 1 [IU] via SUBCUTANEOUS
  Administered 2016-01-17: 2 [IU] via SUBCUTANEOUS
  Administered 2016-01-18: 1 [IU] via SUBCUTANEOUS
  Administered 2016-01-18: 2 [IU] via SUBCUTANEOUS
  Administered 2016-01-18: 1 [IU] via SUBCUTANEOUS

## 2016-01-11 MED ORDER — HYDROCODONE-ACETAMINOPHEN 5-325 MG PO TABS
1.0000 | ORAL_TABLET | ORAL | Status: DC | PRN
  Administered 2016-01-11: 2 via ORAL
  Filled 2016-01-11: qty 2

## 2016-01-11 MED ORDER — MONTELUKAST SODIUM 10 MG PO TABS
10.0000 mg | ORAL_TABLET | Freq: Every day | ORAL | Status: DC
  Administered 2016-01-11 – 2016-02-01 (×17): 10 mg via ORAL
  Filled 2016-01-11 (×21): qty 1

## 2016-01-11 MED ORDER — MAGNESIUM CITRATE PO SOLN
1.0000 | Freq: Once | ORAL | Status: AC | PRN
  Administered 2016-01-12: 1 via ORAL
  Filled 2016-01-11: qty 296

## 2016-01-11 MED ORDER — CLORAZEPATE DIPOTASSIUM 7.5 MG PO TABS: 15.0000 mg | ORAL_TABLET | Freq: Every day | ORAL | Status: DC

## 2016-01-11 MED ORDER — SODIUM CHLORIDE 0.9 % IV SOLN
INTRAVENOUS | Status: DC
  Administered 2016-01-11 – 2016-01-14 (×4): via INTRAVENOUS

## 2016-01-11 MED ORDER — ONDANSETRON HCL 4 MG/2ML IJ SOLN
4.0000 mg | Freq: Once | INTRAMUSCULAR | Status: AC
  Administered 2016-01-11: 4 mg via INTRAVENOUS
  Filled 2016-01-11: qty 2

## 2016-01-11 MED ORDER — SODIUM CHLORIDE 0.9 % IV BOLUS (SEPSIS): 500.0000 mL | Freq: Once | INTRAVENOUS | Status: DC

## 2016-01-11 MED ORDER — CLOPIDOGREL BISULFATE 75 MG PO TABS: 75.0000 mg | ORAL_TABLET | Freq: Every evening | ORAL | Status: DC

## 2016-01-11 MED ORDER — LORAZEPAM 1 MG PO TABS
0.5000 mg | ORAL_TABLET | Freq: Three times a day (TID) | ORAL | Status: DC | PRN
  Administered 2016-01-12: 0.5 mg via ORAL
  Administered 2016-01-17 – 2016-01-19 (×2): 1 mg via ORAL
  Filled 2016-01-11 (×3): qty 1

## 2016-01-11 MED ORDER — ACETAMINOPHEN 500 MG PO TABS: 1000.0000 mg | ORAL_TABLET | Freq: Once | ORAL | Status: DC

## 2016-01-11 MED ORDER — DEXTROSE 5 % IV SOLN
1.0000 g | INTRAVENOUS | Status: AC
  Administered 2016-01-12 – 2016-01-18 (×8): 1 g via INTRAVENOUS
  Filled 2016-01-11 (×7): qty 10

## 2016-01-11 MED ORDER — SODIUM CHLORIDE 0.9 % IV BOLUS (SEPSIS)
1000.0000 mL | Freq: Once | INTRAVENOUS | Status: AC
  Administered 2016-01-11: 1000 mL via INTRAVENOUS

## 2016-01-11 MED ORDER — BENZONATATE 100 MG PO CAPS
100.0000 mg | ORAL_CAPSULE | ORAL | Status: DC | PRN
  Administered 2016-01-11: 100 mg via ORAL
  Filled 2016-01-11: qty 1

## 2016-01-11 MED ORDER — ASPIRIN EC 81 MG PO TBEC
81.0000 mg | DELAYED_RELEASE_TABLET | Freq: Every day | ORAL | Status: DC
  Administered 2016-01-11: 81 mg via ORAL
  Filled 2016-01-11: qty 1

## 2016-01-11 MED ORDER — CLORAZEPATE DIPOTASSIUM 3.75 MG PO TABS
15.0000 mg | ORAL_TABLET | Freq: Every day | ORAL | Status: DC
  Administered 2016-01-11 – 2016-02-01 (×16): 15 mg via ORAL
  Filled 2016-01-11 (×17): qty 4

## 2016-01-11 MED ORDER — LEVOTHYROXINE SODIUM 25 MCG PO TABS
25.0000 ug | ORAL_TABLET | Freq: Every day | ORAL | Status: DC
  Administered 2016-01-12: 25 ug via ORAL
  Filled 2016-01-11: qty 1

## 2016-01-11 MED ORDER — ACETAMINOPHEN 325 MG PO TABS: 650.0000 mg | ORAL_TABLET | Freq: Four times a day (QID) | ORAL | Status: DC | PRN

## 2016-01-11 MED ORDER — SENNOSIDES-DOCUSATE SODIUM 8.6-50 MG PO TABS: 1.0000 | ORAL_TABLET | Freq: Every evening | ORAL | Status: DC | PRN

## 2016-01-11 MED ORDER — IPRATROPIUM-ALBUTEROL 0.5-2.5 (3) MG/3ML IN SOLN
3.0000 mL | RESPIRATORY_TRACT | Status: DC | PRN
  Administered 2016-01-15: 3 mL via RESPIRATORY_TRACT
  Filled 2016-01-11: qty 3

## 2016-01-11 MED ORDER — ACETAMINOPHEN 650 MG RE SUPP
650.0000 mg | Freq: Four times a day (QID) | RECTAL | Status: DC | PRN
Start: 2016-01-11 — End: 2016-01-19

## 2016-01-11 MED ORDER — ONDANSETRON HCL 4 MG/2ML IJ SOLN
4.0000 mg | Freq: Four times a day (QID) | INTRAMUSCULAR | Status: DC | PRN
Start: 2016-01-11 — End: 2016-02-02
  Administered 2016-01-11 – 2016-01-30 (×12): 4 mg via INTRAVENOUS
  Filled 2016-01-11 (×14): qty 2

## 2016-01-11 MED ORDER — EZETIMIBE 10 MG PO TABS
10.0000 mg | ORAL_TABLET | Freq: Every day | ORAL | Status: DC
  Administered 2016-01-16 – 2016-02-02 (×16): 10 mg via ORAL
  Filled 2016-01-11 (×18): qty 1

## 2016-01-11 MED ORDER — METOPROLOL SUCCINATE ER 100 MG PO TB24
100.0000 mg | ORAL_TABLET | Freq: Every day | ORAL | Status: DC
  Administered 2016-01-12: 100 mg via ORAL
  Filled 2016-01-11 (×2): qty 1

## 2016-01-11 MED ORDER — DEXTROSE 5 % IV SOLN
500.0000 mg | Freq: Once | INTRAVENOUS | Status: AC
  Administered 2016-01-11: 500 mg via INTRAVENOUS
  Filled 2016-01-11: qty 500

## 2016-01-11 MED ORDER — BISACODYL 10 MG RE SUPP: 10.0000 mg | Freq: Every day | RECTAL | Status: DC | PRN

## 2016-01-11 MED ORDER — QUETIAPINE FUMARATE 25 MG PO TABS
50.0000 mg | ORAL_TABLET | Freq: Every day | ORAL | Status: DC
  Administered 2016-01-12 – 2016-02-02 (×17): 50 mg via ORAL
  Filled 2016-01-11: qty 2
  Filled 2016-01-11 (×8): qty 1
  Filled 2016-01-11 (×2): qty 2
  Filled 2016-01-11 (×5): qty 1
  Filled 2016-01-11: qty 2

## 2016-01-11 MED ORDER — TRAMADOL HCL 50 MG PO TABS
50.0000 mg | ORAL_TABLET | Freq: Four times a day (QID) | ORAL | Status: DC | PRN
  Administered 2016-01-17 (×2): 50 mg via ORAL
  Filled 2016-01-11 (×2): qty 1

## 2016-01-11 MED ORDER — ONDANSETRON HCL 4 MG PO TABS
4.0000 mg | ORAL_TABLET | Freq: Four times a day (QID) | ORAL | Status: DC | PRN
  Administered 2016-01-26 – 2016-01-27 (×2): 4 mg via ORAL
  Filled 2016-01-11 (×2): qty 1

## 2016-01-11 MED ORDER — SODIUM CHLORIDE 0.9 % IV SOLN: Freq: Once | INTRAVENOUS | Status: DC

## 2016-01-11 MED ORDER — CEFTRIAXONE SODIUM 1 G IJ SOLR
1.0000 g | Freq: Once | INTRAMUSCULAR | Status: AC
  Administered 2016-01-11: 1 g via INTRAVENOUS
  Filled 2016-01-11: qty 10

## 2016-01-11 MED ORDER — ROSUVASTATIN CALCIUM 20 MG PO TABS
20.0000 mg | ORAL_TABLET | Freq: Every day | ORAL | Status: DC
  Administered 2016-01-11 – 2016-02-01 (×18): 20 mg via ORAL
  Filled 2016-01-11: qty 1
  Filled 2016-01-11 (×2): qty 2
  Filled 2016-01-11 (×5): qty 1
  Filled 2016-01-11: qty 2
  Filled 2016-01-11 (×5): qty 1
  Filled 2016-01-11: qty 2
  Filled 2016-01-11 (×4): qty 1
  Filled 2016-01-11: qty 2
  Filled 2016-01-11 (×6): qty 1

## 2016-01-11 MED ORDER — SIMETHICONE 80 MG PO CHEW
80.0000 mg | CHEWABLE_TABLET | Freq: Four times a day (QID) | ORAL | Status: DC | PRN
  Administered 2016-01-11 – 2016-01-19 (×7): 80 mg via ORAL
  Filled 2016-01-11 (×8): qty 1

## 2016-01-11 MED ORDER — SODIUM CHLORIDE 0.9% FLUSH
3.0000 mL | Freq: Two times a day (BID) | INTRAVENOUS | Status: DC
Start: 2016-01-11 — End: 2016-02-02
  Administered 2016-01-11 – 2016-01-30 (×18): 3 mL via INTRAVENOUS
  Administered 2016-01-30: 10 mL via INTRAVENOUS

## 2016-01-11 MED ORDER — ACETAMINOPHEN 325 MG PO TABS
650.0000 mg | ORAL_TABLET | Freq: Once | ORAL | Status: AC
  Administered 2016-01-11: 650 mg via ORAL
  Filled 2016-01-11: qty 2

## 2016-01-11 MED ORDER — DEXTROSE 5 % IV SOLN
500.0000 mg | INTRAVENOUS | Status: AC
  Administered 2016-01-12 – 2016-01-18 (×7): 500 mg via INTRAVENOUS
  Filled 2016-01-11 (×7): qty 500

## 2016-01-11 NOTE — Progress Notes (Signed)
Pharmacy Antibiotic Note  [name] is a 82 y.o. female admitted on 01/11/2016 with CAP.  Pharmacy has been consulted for azithro/ctx dosing. Received 1x doses in the ED this AM. Tmax 100.8, wbc 13.8.  Plan: CTX 1g IV q24h Azithro 500mg  IV q24h Monitor clinical progress, c/s, renal function, LOT Not renally adjusted, Rx will sign off consult     Temp (24hrs), Avg:98.9 F (37.2 C), Min:97.6 F (36.4 C), Max:100.8 F (38.2 C)   Recent Labs Lab 01/11/16 0727 01/11/16 0742 01/11/16 1100  WBC 13.8*  --   --   CREATININE 2.49* 2.60*  --   LATICACIDVEN  --  1.90 1.08    CrCl cannot be calculated (Unknown ideal weight.).    No Known Allergies  Antimicrobials this admission: 5/24 ctx >>  5/24 azithro >>   Dose adjustments this admission:   Microbiology results: 5/24 BCx:  5/24 UCx:   5/24 Sputum:   5/24 MRSA PCR:   Elicia Lamp, PharmD, BCPS Clinical Pharmacist Pager [phone] 01/11/2016 12:50 PM

## 2016-01-11 NOTE — Progress Notes (Signed)
Pharmacy Code Sepsis Protocol  Time of code sepsis page: 0813 [x]  Antibiotics delivered at 0823 []  Antibiotics administered prior to code at  (if checked, omit next 2 questions)  Were antibiotics ordered at the time of the code sepsis page? No Was it required to contact the physician? []  Physician not contacted [x]  Physician contacted to order antibiotics for code sepsis []  Physician contacted to recommend changing antibiotics  Pharmacy consulted for: ctx/azithro per MD  Anti-infectives    Start     Dose/Rate Route Frequency Ordered Stop   01/11/16 0830  azithromycin (ZITHROMAX) 500 mg in dextrose 5 % 250 mL IVPB     500 mg 250 mL/hr over 60 Minutes Intravenous  Once 01/11/16 0820     01/11/16 0830  cefTRIAXone (ROCEPHIN) 1 g in dextrose 5 % 50 mL IVPB     1 g 100 mL/hr over 30 Minutes Intravenous  Once 01/11/16 0820          Nurse education provided: [x]  Minutes left to administer antibiotics to achieve 1 hour goal [x]  Correct order of antibiotic administration [x]  Antibiotic Y-site compatibilities     Elicia Lamp, PharmD, BCPS Clinical Pharmacist 01/11/2016 8:23 AM

## 2016-01-11 NOTE — H&P (Signed)
History and Physical    DAJANA MERLOS [id number] DOB: 12-12-1924 DOA: 01/11/2016   PCP: [name], MD   Patient coming from:  Nursing Home   Chief Complaint: Shortness of Breath   HPI: [name] is a 82 y.o. female with medical history significant for Asthma not on home oxygen, CAD, DM2, hypothyroidism, GERD, CAD,presenting to the emergency department from home with progressive, 3-4 day history of shortness of breath. She has been seen by her PCP on Friday, and placed on Zithromax. However, the symptoms did not resolve. Status was complicated by increasing nausea, productive cough with blood tinge sputum,and increasing generalized fatigue. Denies any pleuritic chest pain. Reported dry heaves. Denies any abdominal pain, diarrhea. Patient was febrile with T up to 100.8  on presentation without chills.  Denies recent hospitalization, sick contacts or recent trips.    ED Course:  On presentation, she was tachycardic and hypotensive. She received 3 l IVF with improvement of her VS, currently at  BP 121/65 mmHg  Pulse 112  Temp(Src) 97.6 F (36.4 C) (Oral)  Resp 24  SpO2 98% WBC 13.8. Pyuria without bacteuria.  Cr 2.6 (BL 1.4)  Lactic acid was 1.9 now 1.08.  Cultures pending.  CXR Chronic bronchitic change and bilateral infrahilar atelectasis without definitive superimposed acute cardiopulmonary disease on this hypoventilated AP portable examination. Per ER  Md, clinical LLL Pneumonia is suspected. She was placed on Zithromax and Rocephin. She will be admitted to stepdown for management of symptoms  Review of Systems: As per HPI otherwise 10 point review of systems negative.   Past Medical History  Diagnosis Date  . Asthma   . Coronary artery disease   . Diabetes mellitus without complication ([place])   . Hypothyroidism   . GERD (gastroesophageal reflux disease)   . Insomnia   . Anxiety   . Hypertension   . CAD (coronary artery disease)   . Osteopenia   . Osteoarthritis   . H/O  echocardiogram 06-26-2012    EF 55%  . H/O cardiovascular stress test 06-26-2012    compared to previous study there is no significant change; normal myocardial perfusion study. no significant ischemia demonstrated . this is a low risk scan  . History of cardiac cath 09-30-1998    cath done by Dr. Claiborne Billings, successful rotation atherectomy of the left anterior decending artert with reduction from 80 to105  . Hx of cardiac cath 09-26-1998    dignostic cath dr. Claiborne Billings to do rotoblator at a later date  . History of Doppler ultrasound 03-30-2008    neg abd. doppler for aneurysm  . History of Holter monitoring 12-22-2007    NSR with pac occs. pvc's    Past Surgical History  Procedure Laterality Date  . Appendectomy  1947  . Dilation and curettage of uterus  1960  . Vesicovaginal fistula closure w/ tah  1971    Dr Mallie Mussel  . Bladder tack  1970    Dr Rosana Hoes  . Breast lumpectomy  1970    Left, Dr Leandrew Koyanagi  . Nasal sinus surgery  1980    Left; for chronic sinusitis  . Revision total knee arthroplasty  2005    Dr Alvan Dame Right knee     reports that she has never smoked. She has never used smokeless tobacco. She reports that she does not drink alcohol or use illicit drugs. Walks unassisted  with cane  with walker Patient is on  wheelchair  No Known Allergies  Family History  Problem  Relation Age of Onset  . Diabetes Father   . Parkinson's disease Father   . Heart disease Father   . Asthma Father   . Diabetes Mother   . Heart disease Mother   . Heart disease Brother   . Diabetes Brother   . Diabetes Brother   . Diabetes Brother       Prior to Admission medications   Medication Sig Start Date End Date Taking? Authorizing Provider  albuterol (PROVENTIL HFA;VENTOLIN HFA) 108 (90 Base) MCG/ACT inhaler Inhale 2 puffs into the lungs every 6 (six) hours as needed for wheezing or shortness of breath.    Historical Provider, MD  albuterol (PROVENTIL) (2.5 MG/3ML) 0.083% nebulizer solution Take 3 mLs (2.5  mg total) by nebulization every 6 (six) hours as needed for wheezing. 05/02/15   Deneise Lever, MD  aspirin EC 81 MG tablet Take 81 mg by mouth at bedtime.     Historical Provider, MD  azelastine (ASTELIN) 137 MCG/SPRAY nasal spray Place 1 spray into the nose 2 (two) times daily. Use in each nostril as directed    Historical Provider, MD  benzonatate (TESSALON) 200 MG capsule TAKE ONE CAPSULE BY MOUTH 3 TIMES A DAY AS NEEDED FOR COUGH 09/22/15   Deneise Lever, MD  clopidogrel (PLAVIX) 75 MG tablet Take 75 mg by mouth every evening.     Historical Provider, MD  clorazepate (TRANXENE) 15 MG tablet Take 15 mg by mouth at bedtime. anxiety    Historical Provider, MD  furosemide (LASIX) 40 MG tablet Take 20 mg by mouth every other day.    Historical Provider, MD  glimepiride (AMARYL) 1 MG tablet Take 1 mg by mouth daily before breakfast.    Historical Provider, MD  ibuprofen (ADVIL,MOTRIN) 200 MG tablet Take 400 mg by mouth every 6 (six) hours as needed for headache or moderate pain.    Historical Provider, MD  levothyroxine (SYNTHROID, LEVOTHROID) 50 MCG tablet Take 25 mcg by mouth daily.     Historical Provider, MD  loperamide (IMODIUM A-D) 2 MG tablet Take 2 mg by mouth as needed for diarrhea or loose stools.     Historical Provider, MD  LORazepam (ATIVAN) 1 MG tablet Take 0.5-1 mg by mouth every 8 (eight) hours as needed for anxiety.     Historical Provider, MD  metoprolol succinate (TOPROL-XL) 50 MG 24 hr tablet Take 100 mg by mouth daily. 09/13/15   Historical Provider, MD  montelukast (SINGULAIR) 10 MG tablet Take 10 mg by mouth at bedtime.    Historical Provider, MD  Multiple Vitamin (MULTIVITAMIN WITH MINERALS) TABS tablet Take 1 tablet by mouth daily.    Historical Provider, MD  nitroGLYCERIN (NITROSTAT) 0.4 MG SL tablet Place 0.4 mg under the tongue every 5 (five) minutes as needed. For chest pain    Historical Provider, MD  omeprazole (PRILOSEC) 20 MG capsule Take 20-40 mg by mouth daily as  needed (acid reflux).    Historical Provider, MD  QUEtiapine (SEROQUEL) 25 MG tablet Take 50 mg by mouth daily with breakfast.    Historical Provider, MD  rosuvastatin (CRESTOR) 20 MG tablet Take 20 mg by mouth at bedtime.     Historical Provider, MD  sucralfate (CARAFATE) 1 GM/10ML suspension Take 10 mLs (1 g total) by mouth 2 (two) times daily. 12/17/15   Ripley Fraise, MD  topiramate (TOPAMAX) 25 MG tablet Take 25 mg by mouth at bedtime.    Historical Provider, MD  traMADol (ULTRAM) 50 MG tablet Take  1 tablet (50 mg total) by mouth every 6 (six) hours as needed. 09/26/15   Deneise Lever, MD  Umeclidinium-Vilanterol (ANORO ELLIPTA) 62.5-25 MCG/INH AEPB Inhale 1 puff into the lungs daily. 01/27/15   Deneise Lever, MD  ZETIA 10 MG tablet Take 10 mg by mouth daily. 08/15/15   Historical Provider, MD    Physical Exam:    Filed Vitals:   01/11/16 0949 01/11/16 1045 01/11/16 1115 01/11/16 1145  BP: 123/51 130/56 116/85 121/65  Pulse: 111 112 113 112  Temp: 97.6 F (36.4 C)     TempSrc: Oral     Resp: 30 23 22 24   SpO2: 98% 98% 98% 98%      Constitutional: ill appearing, uncomfotable  Filed Vitals:   01/11/16 0949 01/11/16 1045 01/11/16 1115 01/11/16 1145  BP: 123/51 130/56 116/85 121/65  Pulse: 111 112 113 112  Temp: 97.6 F (36.4 C)     TempSrc: Oral     Resp: 30 23 22 24   SpO2: 98% 98% 98% 98%   Eyes: PERRL, lids and conjunctivae normal ENMT: Mucous membranes are moist. Posterior pharynx clear of any exudate or lesions.Normal dentition.  Neck: normal, supple, no masses, no thyromegaly Respiratory: bibasilar ronchi, some rales, decreased breath sounds at the bases  Normal respiratory effort. No accessory muscle use.  Cardiovascular: Regular rate and rhythm, no murmurs / rubs / gallops.trace lower extremity edema. 2+ pedal pulses. No carotid bruits.  Abdomen: no tenderness, no masses palpated. No hepatosplenomegaly. Bowel sounds positive.  Musculoskeletal: no clubbing /  cyanosis. No joint deformity upper and lower extremities. Good ROM, no contractures. Normal muscle tone.  Skin: no rashes, lesions, ulcers. No induration Neurologic: CN 2-12 grossly intact. Sensation intact, DTR normal. Strength 5/5 in all 4.  Psychiatric: Normal judgment and insight. Alert and oriented x 3. Normal mood.     Labs on Admission: I have personally reviewed following labs and imaging studies  CBC:  Recent Labs Lab 01/11/16 0727 01/11/16 0742  WBC 13.8*  --   NEUTROABS 10.3*  --   HGB 10.9* 12.2  HCT 35.2* 36.0  MCV 80.7  --   PLT 292  --     Basic Metabolic Panel:  Recent Labs Lab 01/11/16 0727 01/11/16 0742  NA 137 140  K 3.5 3.5  CL 98* 100*  CO2 24  --   GLUCOSE 210* 201*  BUN 97* 92*  CREATININE 2.49* 2.60*  CALCIUM 9.1  --     GFR: CrCl cannot be calculated (Unknown ideal weight.).  Liver Function Tests:  Recent Labs Lab 01/11/16 0727  AST 20  ALT 17  ALKPHOS 43  BILITOT 0.5  PROT 6.2*  ALBUMIN 3.0*   No results for input(s): LIPASE, AMYLASE in the last 168 hours. No results for input(s): AMMONIA in the last 168 hours.  Urine analysis:    Component Value Date/Time   COLORURINE YELLOW 01/11/2016 0957   APPEARANCEUR CLEAR 01/11/2016 0957   LABSPEC 1.025 01/11/2016 0957   PHURINE 5.0 01/11/2016 0957   GLUCOSEU NEGATIVE 01/11/2016 0957   HGBUR NEGATIVE 01/11/2016 0957   BILIRUBINUR SMALL* 01/11/2016 0957   KETONESUR NEGATIVE 01/11/2016 0957   PROTEINUR NEGATIVE 01/11/2016 0957   UROBILINOGEN 0.2 01/18/2014 2313   NITRITE NEGATIVE 01/11/2016 0957   LEUKOCYTESUR MODERATE* 01/11/2016 0957    Sepsis Labs: @LABRCNTIP (procalcitonin:4,lacticidven:4) )No results found for this or any previous visit (from the past 240 hour(s)).   Radiological Exams on Admission: Dg Chest [facility]  01/11/2016  CLINICAL DATA:  Shortness of breath. Nausea and weakness for the past 4 days. Vomiting for 1 day. EXAM: PORTABLE CHEST 1 VIEW COMPARISON:   09/18/2015; 07/15/2015; 01/20/2015; 01/18/2014 FINDINGS: Grossly unchanged cardiac silhouette and mediastinal contours given reduced lung volumes, AP projection and patient rotation. Minimal bilateral infrahilar opacities favored to represent atelectasis. There is unchanged mild diffuse slightly nodular thickening of the pulmonary station. No pleural effusion pneumothorax. No evidence of edema. No acute osseous abnormalities. IMPRESSION: Chronic bronchitic change and bilateral infrahilar atelectasis without definitive superimposed acute cardiopulmonary disease on this hypoventilated AP portable examination. Electronically Signed   By: Sandi Mariscal M.D.   On: 01/11/2016 07:38    EKG: Independently reviewed.  Assessment/Plan Principal Problem:   Sepsis due to pneumonia [facility]) Active Problems:   Diabetes mellitus type 2, controlled ([place])   Coronary atherosclerosis   Asthma with bronchitis   HTN (hypertension)   HLD (hyperlipidemia)   Hypothyroid   GERD (gastroesophageal reflux disease)   Diabetes mellitus type 2 without retinopathy ([place])     Acute respiratory distress in the setting of possible Sepsis likely due to LLL PNA in a patient with a history of Asthma/ Bronchitis,  On presentation, she was tachycardic and hypotensive. She received 3 l IVF with improvement of her VS, currently at  BP 123/51 mmHg  Pulse 111  Temp(Src) 97.6 F (36.4 C) (Oral)  Resp 30  SpO2 98% WBC 13.8. Pyuria without bacteuria. Cr 2.6 (BL 1.4)  Lactic acid was 1.9 now 1.08.  Cultures pending.  CXR Chronic bronchitic change and bilateral infrahilar atelectasis without definitive superimposed acute cardiopulmonary disease Clinical LLL Pneumonia is suspected. She was placed on Zithromax and Rocephin. She will be admitted to stepdown for management of symptoms. patient's ABG pending. Titrate O2 amount and delivery system as needed  Sepsis order set  IV antibiotics by pharmacy with  Zithro and Rocephin  Continue duonebs    Follow lactic acid Follow blood and urine cultures IV fluids at 100 cc/h.  Procalcitonin UA, cultures  Chronic kidney disease stage IV  baseline creatinine 1.3 currently at 2.6     This should improve after 3 L IV fluids and continuous hydration  Lab Results  Component Value Date   CREATININE 2.60* 01/11/2016   CREATININE 2.49* 01/11/2016   CREATININE 1.39* 10/31/2015    Leukocytosis, likely reactive, related to underlying infection. WBC 13.8.  Was febrile with T max to 100. 8, now afebrile  UA with moderate leuk but neg nitrite. CXR Chronic bronchitic change and bilateral infrahilar atelectasis without definitive superimposed acute cardiopulmonary disease Clinical LLL Pneumonia is suspected. Cultures pending  -  antibiotics as above.  -  Repeat CBC in AM  Type II Diabetes Last A1C taken in 2011. Current BS is 201  Check Hgb A1C Hold home oral diabetic medications.   SSI Heart healthy carb modified diet.   Chronic  Grade 1 diastolic heart failure, last echocardiogram [id number] with Systolic function moderately to severely reduced. The estimated EF 30% to 35%. (grade 1 diastolic dysfunction). - Careful use of IVF - Daily weights, strict I/O   Hypothyroidism: Chronic.  -Continue home Synthroid   Hypertension. Patient was hypotensive on presentation, now improved after 3 l fluid. BP 121/65 mmHg  Pulse 112  Temp(Src) 97.6 F (36.4 C) (Oral)  Resp 24  SpO2 98% Continue home anti-hypertensive medication once septic state improves  Add Hydralazine Q6 hours as needed for BP 160/90     Hyperlipidemia Continue Crestor   GERD Continue  PPI   Anxiety Continue her home meds with Tranxene, Ativan   Deconditioning Consult PT/OT  DVT prophylaxis:SCD's Code Status:   Full   Family Communication:  Discussed with son Disposition Plan: Expect patient to be discharged to home after condition improves Consults called:    None Admission status:  SDU    [facility]  E, PA-C [facility]   If 7PM-7AM, please contact night-coverage [email or web address] Password [facility]  01/11/2016, 12:06 PM

## 2016-01-11 NOTE — ED Notes (Signed)
Given food/drink with EDP approval.

## 2016-01-11 NOTE — ED Notes (Signed)
[id number] husband-call with updates

## 2016-01-11 NOTE — ED Notes (Signed)
Pt arrives from home via [facility] with c/o sob. Initial sats were 90% on RA. Arrived on NRB, sats 98%. Recently seen by doc for abd pain, coughing up blood, pt was given Z-Pak abx. ST in 120's, afebrile. Pt does not wear home O2, has hx of asthha, takes Albuterol prn.

## 2016-01-11 NOTE — ED Notes (Signed)
LUNCH TRAY ORDERED, CARB MOD, SOFT DIET

## 2016-01-11 NOTE — ED Notes (Signed)
Admitting at bedside 

## 2016-01-11 NOTE — ED Provider Notes (Addendum)
CSN: [id number]     Arrival date & time 01/11/16  [id number] History   First MD Initiated Contact with Patient 01/11/16 [phone]     Chief Complaint  Patient presents with  . Shortness of Breath      HPI  Patient presents for evaluation of shortness of breath and tachycardia.  She has a history of asthma. Not on home O2. Has had symptoms for last 3-4 days. Nausea, cough, short of breath. Seen by primary care physician and placed on Zithromax on Friday.  Continues with a cough. Has felt weak and more short of breath the last 2 days. Cough productive of sputum occasionally with blood. Nausea but no vomiting no hematemesis. No abdominal pain.  Past Medical History  Diagnosis Date  . Asthma   . Coronary artery disease   . Diabetes mellitus without complication ([place])   . Hypothyroidism   . GERD (gastroesophageal reflux disease)   . Insomnia   . Anxiety   . Hypertension   . CAD (coronary artery disease)   . Osteopenia   . Osteoarthritis   . H/O echocardiogram 06-26-2012    EF 55%  . H/O cardiovascular stress test 06-26-2012    compared to previous study there is no significant change; normal myocardial perfusion study. no significant ischemia demonstrated . this is a low risk scan  . History of cardiac cath 09-30-1998    cath done by Dr. Claiborne Billings, successful rotation atherectomy of the left anterior decending artert with reduction from 80 to105  . Hx of cardiac cath 09-26-1998    dign[facility] cath dr. Claiborne Billings to do rotoblator at a later date  . History of Doppler ultrasound 03-30-2008    neg abd. doppler for aneurysm  . History of Holter monitoring 12-22-2007    NSR with pac occs. pvc's   Past Surgical History  Procedure Laterality Date  . Appendectomy  1947  . Dilation and curettage of uterus  1960  . Vesicovaginal fistula closure w/ tah  1971    Dr Mallie Mussel  . Bladder tack  1970    Dr Rosana Hoes  . Breast lumpectomy  1970    Left, Dr Leandrew Koyanagi  . Nasal sinus surgery  1980    Left; for chronic sinusitis   . Revision total knee arthroplasty  2005    Dr Alvan Dame Right knee   Family History  Problem Relation Age of Onset  . Diabetes Father   . Parkinson's disease Father   . Heart disease Father   . Asthma Father   . Diabetes Mother   . Heart disease Mother   . Heart disease Brother   . Diabetes Brother   . Diabetes Brother   . Diabetes Brother    Social History  Substance Use Topics  . Smoking status: Never Smoker   . Smokeless tobacco: Never Used  . Alcohol Use: No   OB History    No data available     Review of Systems  Constitutional: Positive for fatigue. Negative for fever, chills, diaphoresis and appetite change.  HENT: Negative for mouth sores, sore throat and trouble swallowing.   Eyes: Negative for visual disturbance.  Respiratory: Positive for cough and shortness of breath. Negative for chest tightness and wheezing.   Cardiovascular: Negative for chest pain.  Gastrointestinal: Positive for nausea. Negative for vomiting, abdominal pain, diarrhea and abdominal distention.  Endocrine: Negative for polydipsia, polyphagia and polyuria.  Genitourinary: Negative for dysuria, frequency and hematuria.  Musculoskeletal: Negative for gait problem.  Skin: Negative for  color change, pallor and rash.  Neurological: Negative for dizziness, syncope, light-headedness and headaches.  Hematological: Does not bruise/bleed easily.  Psychiatric/Behavioral: Negative for behavioral problems and confusion.      Allergies  Review of patient's allergies indicates no known allergies.  Home Medications   Prior to Admission medications   Medication Sig Start Date End Date Taking? Authorizing Provider  albuterol (PROVENTIL HFA;VENTOLIN HFA) 108 (90 Base) MCG/ACT inhaler Inhale 2 puffs into the lungs every 6 (six) hours as needed for wheezing or shortness of breath.    Historical Provider, MD  albuterol (PROVENTIL) (2.5 MG/3ML) 0.083% nebulizer solution Take 3 mLs (2.5 mg total) by  nebulization every 6 (six) hours as needed for wheezing. 05/02/15   Deneise Lever, MD  aspirin EC 81 MG tablet Take 81 mg by mouth at bedtime.     Historical Provider, MD  azelastine (ASTELIN) 137 MCG/SPRAY nasal spray Place 1 spray into the nose 2 (two) times daily. Use in each nostril as directed    Historical Provider, MD  benzonatate (TESSALON) 200 MG capsule TAKE ONE CAPSULE BY MOUTH 3 TIMES A DAY AS NEEDED FOR COUGH 09/22/15   Deneise Lever, MD  clopidogrel (PLAVIX) 75 MG tablet Take 75 mg by mouth every evening.     Historical Provider, MD  clorazepate (TRANXENE) 15 MG tablet Take 15 mg by mouth at bedtime. anxiety    Historical Provider, MD  furosemide (LASIX) 40 MG tablet Take 20 mg by mouth every other day.    Historical Provider, MD  glimepiride (AMARYL) 1 MG tablet Take 1 mg by mouth daily before breakfast.    Historical Provider, MD  ibuprofen (ADVIL,MOTRIN) 200 MG tablet Take 400 mg by mouth every 6 (six) hours as needed for headache or moderate pain.    Historical Provider, MD  levothyroxine (SYNTHROID, LEVOTHROID) 50 MCG tablet Take 25 mcg by mouth daily.     Historical Provider, MD  loperamide (IMODIUM A-D) 2 MG tablet Take 2 mg by mouth as needed for diarrhea or loose stools.     Historical Provider, MD  LORazepam (ATIVAN) 1 MG tablet Take 0.5-1 mg by mouth every 8 (eight) hours as needed for anxiety.     Historical Provider, MD  metoprolol succinate (TOPROL-XL) 50 MG 24 hr tablet Take 100 mg by mouth daily. 09/13/15   Historical Provider, MD  montelukast (SINGULAIR) 10 MG tablet Take 10 mg by mouth at bedtime.    Historical Provider, MD  Multiple Vitamin (MULTIVITAMIN WITH MINERALS) TABS tablet Take 1 tablet by mouth daily.    Historical Provider, MD  nitroGLYCERIN (NITROSTAT) 0.4 MG SL tablet Place 0.4 mg under the tongue every 5 (five) minutes as needed. For chest pain    Historical Provider, MD  omeprazole (PRILOSEC) 20 MG capsule Take 20-40 mg by mouth daily as needed (acid  reflux).    Historical Provider, MD  QUEtiapine (SEROQUEL) 25 MG tablet Take 50 mg by mouth daily with breakfast.    Historical Provider, MD  rosuvastatin (CRESTOR) 20 MG tablet Take 20 mg by mouth at bedtime.     Historical Provider, MD  sucralfate (CARAFATE) 1 GM/10ML suspension Take 10 mLs (1 g total) by mouth 2 (two) times daily. 12/17/15   Ripley Fraise, MD  topiramate (TOPAMAX) 25 MG tablet Take 25 mg by mouth at bedtime.    Historical Provider, MD  traMADol (ULTRAM) 50 MG tablet Take 1 tablet (50 mg total) by mouth every 6 (six) hours as needed. 09/26/15  Deneise Lever, MD  Umeclidinium-Vilanterol [facility] ELLIPTA) 62.5-25 MCG/INH AEPB Inhale 1 puff into the lungs daily. 01/27/15   Deneise Lever, MD  ZETIA 10 MG tablet Take 10 mg by mouth daily. 08/15/15   Historical Provider, MD   BP 123/51 mmHg  Pulse 111  Temp(Src) 97.6 F (36.4 C) (Oral)  Resp 30  SpO2 98% Physical Exam  Constitutional: She is oriented to person, place, and time. She appears well-developed and well-nourished. No distress.  Awake alert. Tachypneic. No distress. Mentating well.  HENT:  Head: Normocephalic.  Eyes: Conjunctivae are normal. Pupils are equal, round, and reactive to light. No scleral icterus.  Neck: Normal range of motion. Neck supple. No thyromegaly present.  Cardiovascular: Normal rate and regular rhythm.  Exam reveals no gallop and no friction rub.   No murmur heard. Pulmonary/Chest: Effort normal. No respiratory distress. She has decreased breath sounds in the right lower field and the left lower field. She has no wheezes. She has no rales.  Bibasilar crackles left greater than right. Tachypneic. No prolongation.  Abdominal: Soft. Bowel sounds are normal. She exhibits no distension. There is no tenderness. There is no rebound.  Musculoskeletal: Normal range of motion.  Neurological: She is alert and oriented to person, place, and time.  Skin: Skin is warm and dry. No rash noted.  No peripheral  edema. No cording or swelling. No asymmetry of circumference of the legs.  Psychiatric: She has a normal mood and affect. Her behavior is normal.    ED Course  Procedures (including critical care time) Labs Review Labs Reviewed  CBC WITH DIFFERENTIAL/PLATELET - Abnormal; Notable for the following:    WBC 13.8 (*)    Hemoglobin 10.9 (*)    HCT 35.2 (*)    MCH 25.0 (*)    Neutro Abs 10.3 (*)    Monocytes Absolute 2.5 (*)    All other components within normal limits  BASIC METABOLIC PANEL - Abnormal; Notable for the following:    Chloride 98 (*)    Glucose, Bld 210 (*)    BUN 97 (*)    Creatinine, Ser 2.49 (*)    GFR calc non Af Amer 16 (*)    GFR calc Af Amer 19 (*)    All other components within normal limits  URINALYSIS, ROUTINE W REFLEX MICROSCOPIC (NOT AT [facility]) - Abnormal; Notable for the following:    Bilirubin Urine SMALL (*)    Leukocytes, UA MODERATE (*)    All other components within normal limits  HEPATIC FUNCTION PANEL - Abnormal; Notable for the following:    Total Protein 6.2 (*)    Albumin 3.0 (*)    Bilirubin, Direct <0.1 (*)    All other components within normal limits  URINE MICROSCOPIC-ADD ON - Abnormal; Notable for the following:    Squamous Epithelial / LPF 0-5 (*)    Bacteria, UA RARE (*)    Casts HYALINE CASTS (*)    Crystals URIC ACID CRYSTALS (*)    All other components within normal limits  I-STAT CHEM 8, ED - Abnormal; Notable for the following:    Chloride 100 (*)    BUN 92 (*)    Creatinine, Ser 2.60 (*)    Glucose, Bld 201 (*)    Calcium, Ion 1.09 (*)    All other components within normal limits  CULTURE, BLOOD (ROUTINE X 2)  CULTURE, BLOOD (ROUTINE X 2)  URINE CULTURE  I-STAT CG4 LACTIC ACID, ED  Randolm Idol, ED  Imaging Review Dg Chest Port 1 View  01/11/2016  CLINICAL DATA:  Shortness of breath. Nausea and weakness for the past 4 days. Vomiting for 1 day. EXAM: PORTABLE CHEST 1 VIEW COMPARISON:  09/18/2015; 07/15/2015;  01/20/2015; 01/18/2014 FINDINGS: Grossly unchanged cardiac silhouette and mediastinal contours given reduced lung volumes, AP projection and patient rotation. Minimal bilateral infrahilar opacities favored to represent atelectasis. There is unchanged mild diffuse slightly nodular thickening of the pulmonary station. No pleural effusion pneumothorax. No evidence of edema. No acute osseous abnormalities. IMPRESSION: Chronic bronchitic change and bilateral infrahilar atelectasis without definitive superimposed acute cardiopulmonary disease on this hypoventilated AP portable examination. Electronically Signed   By: Sandi Mariscal M.D.   On: 01/11/2016 07:38   I have personally reviewed and evaluated these images and lab results as part of my medical decision-making.   EKG Interpretation   Date/Time:  Wednesday Jan 11 2016 06:45:41 EDT Ventricular Rate:  125 PR Interval:  111 QRS Duration: 148 QT Interval:  353 QTC Calculation: 509 R Axis:   -2 Text Interpretation:  Sinus tachycardia Left bundle branch block Confirmed  by Jeneen Rinks  MD, [place] ([id number]) on 01/11/2016 7:10:27 AM Also confirmed by Jeneen Rinks   MD, [place] ([id number]), editor Gilford Rile, CCT, [place] ([id number])  on 01/11/2016  8:49:25 AM      MDM   Final diagnoses:  Sepsis, due to unspecified organism ([place])  Fever, unspecified fever cause  AKI (acute kidney injury) ([place])     CRITICAL CARE Performed by: Tanna Furry JOSEPH   Total critical care time: 30 minutes  Critical care time was exclusive of separately billable procedures and treating other patients.  Critical care was necessary to treat or prevent imminent or life-threatening deterioration.  Critical care was time spent personally by me on the following activities: development of treatment plan with patient and/or surrogate as well as nursing, discussions with consultants, evaluation of patient's response to treatment, examination of patient, obtaining history from patient or surrogate, ordering  and performing treatments and interventions, ordering and review of laboratory studies, ordering and review of radiographic studies, pulse oximetry and re-evaluation of patient's condition.  Rectal temperature 100.8. Tachycardic and hypotensive. Constellation of findings consistent with sepsis. Given Tylenol. Blood and urine cultures obtained. Given Rocephin and Zithromax. Started on sepsis protocol. Pressures improved. Heart rate has improved still at 105. Leukocytosis 13.8. Pyuria without bacteriuria. Has AK I with creatinine 2.6 about her baseline of 1.4.  Discussion No risks for DVT or PE. Has occasional hemoptysis. She does not have chest pain or pleuritic discomfort. With fever and exam I think this is likely to be community acquired pneumonia. I placed a call hospitalist regarding admission.   Tanna Furry, MD 01/11/16 [place], MD 01/11/16 [phone]

## 2016-01-12 ENCOUNTER — Inpatient Hospital Stay (HOSPITAL_COMMUNITY): Payer: Medicare Other

## 2016-01-12 DIAGNOSIS — K56609 Unspecified intestinal obstruction, unspecified as to partial versus complete obstruction: Secondary | ICD-10-CM

## 2016-01-12 DIAGNOSIS — E876 Hypokalemia: Secondary | ICD-10-CM | POA: Diagnosis present

## 2016-01-12 DIAGNOSIS — J9620 Acute and chronic respiratory failure, unspecified whether with hypoxia or hypercapnia: Secondary | ICD-10-CM

## 2016-01-12 LAB — BASIC METABOLIC PANEL
Anion gap: 9 (ref 5–15)
BUN: 64 mg/dL — AB (ref 6–20)
CO2: 23 mmol/L (ref 22–32)
Calcium: 8.4 mg/dL — ABNORMAL LOW (ref 8.9–10.3)
Chloride: 106 mmol/L (ref 101–111)
Creatinine, Ser: 1.41 mg/dL — ABNORMAL HIGH (ref 0.44–1.00)
GFR calc Af Amer: 37 mL/min — ABNORMAL LOW (ref >60)
GFR, EST NON AFRICAN AMERICAN: 32 mL/min — AB (ref >60)
GLUCOSE: 173 mg/dL — AB (ref 65–99)
POTASSIUM: 4 mmol/L (ref 3.5–5.1)
Sodium: 138 mmol/L (ref 135–145)

## 2016-01-12 LAB — PROTIME-INR
INR: 1.39 (ref 0.00–1.49)
Prothrombin Time: 17.2 seconds — ABNORMAL HIGH (ref 11.6–15.2)

## 2016-01-12 LAB — GLUCOSE, CAPILLARY
Glucose-Capillary: 181 mg/dL — ABNORMAL HIGH (ref 65–99)
Glucose-Capillary: 176 mg/dL — ABNORMAL HIGH (ref 65–99)
Glucose-Capillary: 219 mg/dL — ABNORMAL HIGH (ref 65–99)
Glucose-Capillary: 160 mg/dL — ABNORMAL HIGH (ref 65–99)
Glucose-Capillary: 154 mg/dL — ABNORMAL HIGH (ref 65–99)

## 2016-01-12 LAB — CBC
HCT: 34.6 % — ABNORMAL LOW (ref 36.0–46.0)
HEMATOCRIT: 32.8 % — AB (ref 36.0–46.0)
Hemoglobin: 10.2 g/dL — ABNORMAL LOW (ref 12.0–15.0)
Hemoglobin: 10.6 g/dL — ABNORMAL LOW (ref 12.0–15.0)
MCH: 24.6 pg — AB (ref 26.0–34.0)
MCH: 24.8 pg — AB (ref 26.0–34.0)
MCHC: 31.1 g/dL (ref 30.0–36.0)
MCHC: 30.6 g/dL (ref 30.0–36.0)
MCV: 79.2 fL (ref 78.0–100.0)
MCV: 80.8 fL (ref 78.0–100.0)
PLATELETS: 295 10*3/uL (ref 150–400)
PLATELETS: 312 10*3/uL (ref 150–400)
RBC: 4.14 MIL/uL (ref 3.87–5.11)
RBC: 4.28 MIL/uL (ref 3.87–5.11)
RDW: 15 % (ref 11.5–15.5)
RDW: 15.3 % (ref 11.5–15.5)
WBC: 8.8 10*3/uL (ref 4.0–10.5)
WBC: 9.2 10*3/uL (ref 4.0–10.5)

## 2016-01-12 LAB — URINE CULTURE: Culture: NO GROWTH

## 2016-01-12 LAB — COMPREHENSIVE METABOLIC PANEL
ALBUMIN: 2.6 g/dL — AB (ref 3.5–5.0)
ALT: 27 U/L (ref 14–54)
AST: 27 U/L (ref 15–41)
Alkaline Phosphatase: 41 U/L (ref 38–126)
Anion gap: 11 (ref 5–15)
BUN: 74 mg/dL — AB (ref 6–20)
CHLORIDE: 107 mmol/L (ref 101–111)
CO2: 22 mmol/L (ref 22–32)
CREATININE: 1.49 mg/dL — AB (ref 0.44–1.00)
Calcium: 8.3 mg/dL — ABNORMAL LOW (ref 8.9–10.3)
GFR calc Af Amer: 35 mL/min — ABNORMAL LOW (ref >60)
GFR, EST NON AFRICAN AMERICAN: 30 mL/min — AB (ref >60)
GLUCOSE: 177 mg/dL — AB (ref 65–99)
POTASSIUM: 2.7 mmol/L — AB (ref 3.5–5.1)
SODIUM: 140 mmol/L (ref 135–145)
Total Bilirubin: 0.5 mg/dL (ref 0.3–1.2)
Total Protein: 5.8 g/dL — ABNORMAL LOW (ref 6.5–8.1)

## 2016-01-12 LAB — TROPONIN I
Troponin I: 0.06 ng/mL — ABNORMAL HIGH (ref <0.031)
Troponin I: 0.05 ng/mL — ABNORMAL HIGH (ref <0.031)
Troponin I: 0.05 ng/mL — ABNORMAL HIGH (ref <0.031)

## 2016-01-12 LAB — POTASSIUM: POTASSIUM: 2.8 mmol/L — AB (ref 3.5–5.1)

## 2016-01-12 LAB — HEMOGLOBIN A1C
HEMOGLOBIN A1C: 7.4 % — AB (ref 4.8–5.6)
MEAN PLASMA GLUCOSE: 166 mg/dL

## 2016-01-12 LAB — MAGNESIUM: Magnesium: 2.3 mg/dL (ref 1.7–2.4)

## 2016-01-12 MED ORDER — POTASSIUM CHLORIDE 10 MEQ/100ML IV SOLN
10.0000 meq | INTRAVENOUS | Status: AC
  Administered 2016-01-12 (×4): 10 meq via INTRAVENOUS
  Filled 2016-01-12 (×4): qty 100

## 2016-01-12 MED ORDER — POTASSIUM CHLORIDE 10 MEQ/100ML IV SOLN
10.0000 meq | INTRAVENOUS | Status: AC
  Administered 2016-01-12 (×2): 10 meq via INTRAVENOUS
  Filled 2016-01-12 (×2): qty 100

## 2016-01-12 MED ORDER — POTASSIUM CHLORIDE CRYS ER 20 MEQ PO TBCR
40.0000 meq | EXTENDED_RELEASE_TABLET | Freq: Two times a day (BID) | ORAL | Status: AC
  Administered 2016-01-12: 40 meq via ORAL
  Filled 2016-01-12: qty 2

## 2016-01-12 MED ORDER — LIDOCAINE VISCOUS 2 % MT SOLN
OROMUCOSAL | Status: AC
  Filled 2016-01-12: qty 15

## 2016-01-12 MED ORDER — PROMETHAZINE HCL 6.25 MG/5ML PO SYRP
25.0000 mg | ORAL_SOLUTION | Freq: Four times a day (QID) | ORAL | Status: DC | PRN
  Filled 2016-01-12: qty 20

## 2016-01-12 MED ORDER — PANTOPRAZOLE SODIUM 40 MG IV SOLR
40.0000 mg | INTRAVENOUS | Status: DC
  Administered 2016-01-13 – 2016-01-16 (×4): 40 mg via INTRAVENOUS
  Filled 2016-01-12 (×4): qty 40

## 2016-01-12 MED ORDER — METOPROLOL TARTRATE 5 MG/5ML IV SOLN
5.0000 mg | Freq: Once | INTRAVENOUS | Status: AC
  Administered 2016-01-12: 5 mg via INTRAVENOUS
  Filled 2016-01-12: qty 5

## 2016-01-12 MED ORDER — GI COCKTAIL ~~LOC~~
30.0000 mL | Freq: Once | ORAL | Status: AC
  Administered 2016-01-12: 30 mL via ORAL
  Filled 2016-01-12: qty 30

## 2016-01-12 MED ORDER — PANTOPRAZOLE SODIUM 40 MG IV SOLR: 40.0000 mg | INTRAVENOUS | Status: DC

## 2016-01-12 MED ORDER — SODIUM CHLORIDE 0.9 % IV BOLUS (SEPSIS)
500.0000 mL | Freq: Once | INTRAVENOUS | Status: AC
  Administered 2016-01-12: 500 mL via INTRAVENOUS

## 2016-01-12 NOTE — Consult Note (Signed)
Reason for Consult:  SBO Referring Physician:  Dr. Darryll Capers [name] is an 81 y.o. female.   HPI: pt admitted 5/24 with with 3-4 days history of SOB.  She was seen 01/06/16 by her PCP and placed on azithromycin, dates are not solid.  SOB did not improve, she became febrile with temp up to 100.8 at home.  Work up shows RLL pneumonia, she has been tachycardic over night, and developed epigastric and RUQ pain.   She is afebrile, tachycardic,K+ 2.8, creatinine is 1.49, trace troponin's.  CT scan this AM shows high grade SBO, ileal and colon loops are not distended.  We are ask to see.  She has a hx of appendectomy, vesicovaginal fistula and bladder tac in the past.  NG has been placed and we are ask to see. Pt reports she has been having abdominal distension for a couple weeks, and she cannot remember when her last BM was.   NG is not working I spent 20 minutes working with the small tube that is in place.  I am going to send her to Xray/Fluro to have better NG placed.  Current one is not functional.     Past Medical History  Diagnosis Date  . Asthma   . Coronary artery disease   . Diabetes mellitus without complication ([place])   . Hypothyroidism   . GERD (gastroesophageal reflux disease)   . Insomnia   . Anxiety   . Hypertension   . CAD (coronary artery disease)   . Osteopenia   . Osteoarthritis   . H/O echocardiogram 06-26-2012    EF 55%  . H/O cardiovascular stress test 06-26-2012    compared to previous study there is no significant change; normal myocardial perfusion study. no significant ischemia demonstrated . this is a low risk scan  . History of cardiac cath 09-30-1998    cath done by Dr. Claiborne Billings, successful rotation atherectomy of the left anterior decending artert with reduction from 80 to105  . Hx of cardiac cath 09-26-1998    [facility] cath dr. Claiborne Billings to do rotoblator at a later date  . History of Doppler ultrasound 03-30-2008    neg abd. doppler for aneurysm  . History of Holter  monitoring 12-22-2007    NSR with pac occs. pvc's    Past Surgical History  Procedure Laterality Date  . Appendectomy  1947  . Dilation and curettage of uterus  1960  . Vesicovaginal fistula closure w/ tah  1971    Dr Mallie Mussel  . Bladder tack  1970    Dr Rosana Hoes  . Breast lumpectomy  1970    Left, Dr Leandrew Koyanagi  . Nasal sinus surgery  1980    Left; for chronic sinusitis  . Revision total knee arthroplasty  2005    Dr Alvan Dame Right knee    Family History  Problem Relation Age of Onset  . Diabetes Father   . Parkinson's disease Father   . Heart disease Father   . Asthma Father   . Diabetes Mother   . Heart disease Mother   . Heart disease Brother   . Diabetes Brother   . Diabetes Brother   . Diabetes Brother     Social History:  reports that she has never smoked. She has never used smokeless tobacco. She reports that she does not drink alcohol or use illicit drugs.  Allergies: No Known Allergies  Medications:  Prior to Admission:  Prescriptions prior to admission  Medication Sig Dispense Refill Last  Dose  . acetaminophen-codeine (TYLENOL #3) 300-30 MG tablet Take 1 tablet by mouth every 4 (four) hours as needed for moderate pain.   Past Week at Unknown time  . albuterol (PROVENTIL HFA;VENTOLIN HFA) 108 (90 Base) MCG/ACT inhaler Inhale 2 puffs into the lungs every 6 (six) hours as needed for wheezing or shortness of breath.   rescue at rescue  . albuterol (PROVENTIL) (2.5 MG/3ML) 0.083% nebulizer solution Take 3 mLs (2.5 mg total) by nebulization every 6 (six) hours as needed for wheezing. 75 mL 11 Past Week at Unknown time  . aspirin EC 81 MG tablet Take 81 mg by mouth at bedtime.    01/10/2016 at Unknown time  . azelastine (ASTELIN) 137 MCG/SPRAY nasal spray Place 1 spray into the nose 2 (two) times daily. Use in each nostril as directed   01/10/2016 at Unknown time  . benzonatate (TESSALON) 200 MG capsule TAKE ONE CAPSULE BY MOUTH 3 TIMES A DAY AS NEEDED FOR COUGH 30 capsule 5 Past  Week at Unknown time  . clopidogrel (PLAVIX) 75 MG tablet Take 75 mg by mouth every evening.    01/10/2016 at Unknown time  . clorazepate (TRANXENE) 15 MG tablet Take 15 mg by mouth at bedtime. anxiety   01/10/2016 at Unknown time  . furosemide (LASIX) 40 MG tablet Take 20 mg by mouth every other day.   Past Week at Unknown time  . glimepiride (AMARYL) 1 MG tablet Take 1 mg by mouth daily before breakfast.   01/10/2016 at Unknown time  . guaiFENesin (MUCINEX) 600 MG 12 hr tablet Take 600 mg by mouth 2 (two) times daily.   Past Week at Unknown time  . ibuprofen (ADVIL,MOTRIN) 200 MG tablet Take 400 mg by mouth every 6 (six) hours as needed for headache or moderate pain.   Past Week at Unknown time  . levothyroxine (SYNTHROID, LEVOTHROID) 50 MCG tablet Take 25 mcg by mouth daily.    01/10/2016 at Unknown time  . loperamide (IMODIUM A-D) 2 MG tablet Take 2 mg by mouth as needed for diarrhea or loose stools.    Past Week at Unknown time  . LORazepam (ATIVAN) 1 MG tablet Take 0.5-1 mg by mouth every 8 (eight) hours as needed for anxiety.    Past Week at Unknown time  . metoprolol succinate (TOPROL-XL) 50 MG 24 hr tablet Take 100 mg by mouth daily.   01/10/2016 at 0800  . montelukast (SINGULAIR) 10 MG tablet Take 10 mg by mouth at bedtime.   Past Week at Unknown time  . Multiple Vitamin (MULTIVITAMIN WITH MINERALS) TABS tablet Take 1 tablet by mouth daily.   01/10/2016 at Unknown time  . nitroGLYCERIN (NITROSTAT) 0.4 MG SL tablet Place 0.4 mg under the tongue every 5 (five) minutes as needed. For chest pain   unk at unk  . omeprazole (PRILOSEC) 20 MG capsule Take 20-40 mg by mouth daily as needed (acid reflux).   Past Week at Unknown time  . QUEtiapine (SEROQUEL) 25 MG tablet Take 50 mg by mouth daily with breakfast.   01/10/2016 at Unknown time  . rosuvastatin (CRESTOR) 20 MG tablet Take 20 mg by mouth at bedtime.    Past Week at Unknown time  . topiramate (TOPAMAX) 25 MG tablet Take 25 mg by mouth at bedtime.    Past Week at Unknown time  . traMADol (ULTRAM) 50 MG tablet Take 1 tablet (50 mg total) by mouth every 6 (six) hours as needed. (Patient taking differently: Take 50  mg by mouth every 6 (six) hours as needed for moderate pain. ) 40 tablet 0 Past Week at Unknown time  . Umeclidinium-Vilanterol (ANORO ELLIPTA) 62.5-25 MCG/INH AEPB Inhale 1 puff into the lungs daily. 60 each 5 01/10/2016 at Unknown time  . ZETIA 10 MG tablet Take 10 mg by mouth daily.  2 01/10/2016 at Unknown time  . sucralfate (CARAFATE) 1 GM/10ML suspension Take 10 mLs (1 g total) by mouth 2 (two) times daily. (Patient not taking: Reported on 01/11/2016) 420 mL 0 Completed Course at Unknown time   Scheduled: . aspirin EC  81 mg Oral QHS  . azithromycin  500 mg Intravenous Q24H  . cefTRIAXone (ROCEPHIN)  IV  1 g Intravenous Q24H  . clopidogrel  75 mg Oral QPM  . clorazepate  15 mg Oral QHS  . ezetimibe  10 mg Oral Daily  . insulin aspart  0-9 Units Subcutaneous TID WC  . levothyroxine  25 mcg Oral QAC breakfast  . metoprolol succinate  100 mg Oral Daily  . montelukast  10 mg Oral QHS  . pantoprazole  40 mg Oral Daily  . potassium chloride  40 mEq Oral BID  . QUEtiapine  50 mg Oral Q breakfast  . rosuvastatin  20 mg Oral QHS  . sodium chloride flush  3 mL Intravenous Q12H   Continuous: . sodium chloride 75 mL/hr at 01/12/16 0700   GDJ:[id number] **OR** acetaminophen, benzonatate, bisacodyl, HYDROcodone-acetaminophen, ipratropium-albuterol, LORazepam, ondansetron **OR** ondansetron (ZOFRAN) IV, promethazine, senna-docusate, simethicone, traMADol Anti-infectives    Start     Dose/Rate Route Frequency Ordered Stop   01/12/16 0900  cefTRIAXone (ROCEPHIN) 1 g in dextrose 5 % 50 mL IVPB     1 g 100 mL/hr over 30 Minutes Intravenous Every 24 hours 01/11/16 1251     01/12/16 0900  azithromycin (ZITHROMAX) 500 mg in dextrose 5 % 250 mL IVPB     500 mg 250 mL/hr over 60 Minutes Intravenous Every 24 hours 01/11/16 1251      01/11/16 0830  azithromycin (ZITHROMAX) 500 mg in dextrose 5 % 250 mL IVPB     500 mg 250 mL/hr over 60 Minutes Intravenous  Once 01/11/16 0820 01/11/16 0935   01/11/16 0830  cefTRIAXone (ROCEPHIN) 1 g in dextrose 5 % 50 mL IVPB     1 g 100 mL/hr over 30 Minutes Intravenous  Once 01/11/16 0820 01/11/16 0947      Results for orders placed or performed during the hospital encounter of 01/11/16 (from the past 48 hour(s))  CBC with Differential/Platelet     Status: Abnormal   Collection Time: 01/11/16  7:27 AM  Result Value Ref Range   WBC 13.8 (H) 4.0 - 10.5 K/uL   RBC 4.36 3.87 - 5.11 MIL/uL   Hemoglobin 10.9 (L) 12.0 - 15.0 g/dL   HCT 35.2 (L) 36.0 - 46.0 %   MCV 80.7 78.0 - 100.0 fL   MCH 25.0 (L) 26.0 - 34.0 pg   MCHC 31.0 30.0 - 36.0 g/dL   RDW 15.3 11.5 - 15.5 %   Platelets 292 150 - 400 K/uL   Neutrophils Relative % 75 %   Lymphocytes Relative 7 %   Monocytes Relative 18 %   Eosinophils Relative 0 %   Basophils Relative 0 %   Neutro Abs 10.3 (H) 1.7 - 7.7 K/uL   Lymphs Abs 1.0 0.7 - 4.0 K/uL   Monocytes Absolute 2.5 (H) 0.1 - 1.0 K/uL   Eosinophils Absolute 0.0 0.0 - 0.7  K/uL   Basophils Absolute 0.0 0.0 - 0.1 K/uL   RBC Morphology POLYCHROMASIA PRESENT   Basic metabolic panel     Status: Abnormal   Collection Time: 01/11/16  7:27 AM  Result Value Ref Range   Sodium 137 135 - 145 mmol/L   Potassium 3.5 3.5 - 5.1 mmol/L   Chloride 98 (L) 101 - 111 mmol/L   CO2 24 22 - 32 mmol/L   Glucose, Bld 210 (H) 65 - 99 mg/dL   BUN 97 (H) 6 - 20 mg/dL   Creatinine, Ser 2.49 (H) 0.44 - 1.00 mg/dL   Calcium 9.1 8.9 - 10.3 mg/dL   GFR calc non Af Amer 16 (L) >60 mL/min   GFR calc Af Amer 19 (L) >60 mL/min    Comment: (NOTE) The eGFR has been calculated using the CKD EPI equation. This calculation has not been validated in all clinical situations. eGFR's persistently <60 mL/min signify possible Chronic Kidney Disease. CORRECTED ON 05/24 AT 0844: PREVIOUSLY REPORTED AS 19     Anion gap 15 5 - 15  Hepatic function panel     Status: Abnormal   Collection Time: 01/11/16  7:27 AM  Result Value Ref Range   Total Protein 6.2 (L) 6.5 - 8.1 g/dL   Albumin 3.0 (L) 3.5 - 5.0 g/dL   AST 20 15 - 41 U/L   ALT 17 14 - 54 U/L   Alkaline Phosphatase 43 38 - 126 U/L   Total Bilirubin 0.5 0.3 - 1.2 mg/dL   Bilirubin, Direct <0.1 (L) 0.1 - 0.5 mg/dL   Indirect Bilirubin NOT CALCULATED 0.3 - 0.9 mg/dL  I-stat troponin, ED     Status: None   Collection Time: 01/11/16  7:41 AM  Result Value Ref Range   Troponin i, poc 0.05 0.00 - 0.08 ng/mL   Comment 3            Comment: Due to the release kinetics of cTnI, a negative result within the first hours of the onset of symptoms does not rule out myocardial infarction with certainty. If myocardial infarction is still suspected, repeat the test at appropriate intervals.   I-Stat CG4 Lactic Acid, ED     Status: None   Collection Time: 01/11/16  7:42 AM  Result Value Ref Range   Lactic Acid, Venous 1.90 0.5 - 2.0 mmol/L  I-stat chem 8, ed     Status: Abnormal   Collection Time: 01/11/16  7:42 AM  Result Value Ref Range   Sodium 140 135 - 145 mmol/L   Potassium 3.5 3.5 - 5.1 mmol/L   Chloride 100 (L) 101 - 111 mmol/L   BUN 92 (H) 6 - 20 mg/dL   Creatinine, Ser 2.60 (H) 0.44 - 1.00 mg/dL   Glucose, Bld 201 (H) 65 - 99 mg/dL   Calcium, Ion 1.09 (L) 1.13 - 1.30 mmol/L   TCO2 25 0 - 100 mmol/L   Hemoglobin 12.2 12.0 - 15.0 g/dL   HCT 36.0 36.0 - 46.0 %  Urinalysis, Routine w reflex microscopic (not at [facility])     Status: Abnormal   Collection Time: 01/11/16  9:57 AM  Result Value Ref Range   Color, Urine YELLOW YELLOW   APPearance CLEAR CLEAR   Specific Gravity, Urine 1.025 1.005 - 1.030   pH 5.0 5.0 - 8.0   Glucose, UA NEGATIVE NEGATIVE mg/dL   Hgb urine dipstick NEGATIVE NEGATIVE   Bilirubin Urine SMALL (A) NEGATIVE   Ketones, ur NEGATIVE NEGATIVE mg/dL  Protein, ur NEGATIVE NEGATIVE mg/dL   Nitrite NEGATIVE NEGATIVE    Leukocytes, UA MODERATE (A) NEGATIVE  Urine culture     Status: None   Collection Time: 01/11/16  9:57 AM  Result Value Ref Range   Specimen Description URINE, CATHETERIZED    Special Requests NONE    Culture NO GROWTH    Report Status 01/12/2016 FINAL   Urine microscopic-add on     Status: Abnormal   Collection Time: 01/11/16  9:57 AM  Result Value Ref Range   Squamous Epithelial / LPF 0-5 (A) NONE SEEN   WBC, UA 6-30 0 - 5 WBC/hpf   RBC / HPF 0-5 0 - 5 RBC/hpf   Bacteria, UA RARE (A) NONE SEEN   Casts HYALINE CASTS (A) NEGATIVE   Crystals URIC ACID CRYSTALS (A) NEGATIVE  I-Stat CG4 Lactic Acid, ED  (not at  [facility])     Status: None   Collection Time: 01/11/16 11:00 AM  Result Value Ref Range   Lactic Acid, Venous 1.08 0.5 - 2.0 mmol/L  CBC with Differential     Status: Abnormal   Collection Time: 01/11/16 12:25 PM  Result Value Ref Range   WBC 10.7 (H) 4.0 - 10.5 K/uL   RBC 4.15 3.87 - 5.11 MIL/uL   Hemoglobin 10.4 (L) 12.0 - 15.0 g/dL   HCT 32.9 (L) 36.0 - 46.0 %   MCV 79.3 78.0 - 100.0 fL   MCH 25.1 (L) 26.0 - 34.0 pg   MCHC 31.6 30.0 - 36.0 g/dL   RDW 15.1 11.5 - 15.5 %   Platelets 256 150 - 400 K/uL   Neutrophils Relative % 68 %   Neutro Abs 7.3 1.7 - 7.7 K/uL   Lymphocytes Relative 10 %   Lymphs Abs 1.1 0.7 - 4.0 K/uL   Monocytes Relative 22 %   Monocytes Absolute 2.3 (H) 0.1 - 1.0 K/uL   Eosinophils Relative 0 %   Eosinophils Absolute 0.0 0.0 - 0.7 K/uL   Basophils Relative 0 %   Basophils Absolute 0.0 0.0 - 0.1 K/uL  Comprehensive metabolic panel     Status: Abnormal   Collection Time: 01/11/16 12:25 PM  Result Value Ref Range   Sodium 137 135 - 145 mmol/L   Potassium 3.0 (L) 3.5 - 5.1 mmol/L   Chloride 104 101 - 111 mmol/L   CO2 22 22 - 32 mmol/L   Glucose, Bld 198 (H) 65 - 99 mg/dL   BUN 90 (H) 6 - 20 mg/dL   Creatinine, Ser 2.07 (H) 0.44 - 1.00 mg/dL   Calcium 8.3 (L) 8.9 - 10.3 mg/dL   Total Protein 5.7 (L) 6.5 - 8.1 g/dL   Albumin 2.7 (L) 3.5 - 5.0  g/dL   AST 19 15 - 41 U/L   ALT 16 14 - 54 U/L   Alkaline Phosphatase 42 38 - 126 U/L   Total Bilirubin 0.4 0.3 - 1.2 mg/dL   GFR calc non Af Amer 20 (L) >60 mL/min   GFR calc Af Amer 23 (L) >60 mL/min    Comment: (NOTE) The eGFR has been calculated using the CKD EPI equation. This calculation has not been validated in all clinical situations. eGFR's persistently <60 mL/min signify possible Chronic Kidney Disease.    Anion gap 11 5 - 15  Lactic acid, plasma     Status: None   Collection Time: 01/11/16 12:25 PM  Result Value Ref Range   Lactic Acid, Venous 1.5 0.5 - 2.0 mmol/L  Procalcitonin  Status: None   Collection Time: 01/11/16 12:25 PM  Result Value Ref Range   Procalcitonin 0.26 ng/mL    Comment:        Interpretation: PCT (Procalcitonin) <= 0.5 ng/mL: Systemic infection (sepsis) is not likely. Local bacterial infection is possible. (NOTE)         ICU PCT Algorithm               Non ICU PCT Algorithm    ----------------------------     ------------------------------         PCT < 0.25 ng/mL                 PCT < 0.1 ng/mL     Stopping of antibiotics            Stopping of antibiotics       strongly encouraged.               strongly encouraged.    ----------------------------     ------------------------------       PCT level decrease by               PCT < 0.25 ng/mL       >= 80% from peak PCT       OR PCT 0.25 - 0.5 ng/mL          Stopping of antibiotics                                             encouraged.     Stopping of antibiotics           encouraged.    ----------------------------     ------------------------------       PCT level decrease by              PCT >= 0.25 ng/mL       < 80% from peak PCT        AND PCT >= 0.5 ng/mL            Continuin g antibiotics                                              encouraged.       Continuing antibiotics            encouraged.    ----------------------------     ------------------------------     PCT level  increase compared          PCT > 0.5 ng/mL         with peak PCT AND          PCT >= 0.5 ng/mL             Escalation of antibiotics                                          strongly encouraged.      Escalation of antibiotics        strongly encouraged.   Protime-INR     Status: Abnormal   Collection Time: 01/11/16 12:25 PM  Result Value Ref Range   Prothrombin Time 16.7 (H) 11.6 - 15.2 seconds  INR 1.34 0.00 - 1.49  APTT     Status: None   Collection Time: 01/11/16 12:25 PM  Result Value Ref Range   aPTT 30 24 - 37 seconds  Hemoglobin A1c     Status: Abnormal   Collection Time: 01/11/16 12:25 PM  Result Value Ref Range   Hgb A1c MFr Bld 7.4 (H) 4.8 - 5.6 %    Comment: (NOTE)         Pre-diabetes: 5.7 - 6.4         Diabetes: >6.4         Glycemic control for adults with diabetes: <7.0    Mean Plasma Glucose 166 mg/dL    Comment: (NOTE) Performed At: [facility] [address] [place], [place] [id number] Lindon Romp MD AF:[id number]   Lactic acid, plasma     Status: None   Collection Time: 01/11/16  3:33 PM  Result Value Ref Range   Lactic Acid, Venous 1.2 0.5 - 2.0 mmol/L  Blood gas, arterial     Status: Abnormal   Collection Time: 01/11/16  5:35 PM  Result Value Ref Range   O2 Content 2.0 L/min   Delivery systems NASAL CANNULA    pH, Arterial 7.457 (H) 7.350 - 7.450   pCO2 arterial 32.1 (L) 35.0 - 45.0 mmHg   pO2, Arterial 101 (H) 80.0 - 100.0 mmHg   Bicarbonate 22.4 20.0 - 24.0 mEq/L   TCO2 23.4 0 - 100 mmol/L   Acid-base deficit 1.0 0.0 - 2.0 mmol/L   O2 Saturation 96.9 %   Patient temperature 98.6    Collection site RIGHT RADIAL    Drawn by [phone]    Sample type ARTERIAL DRAW    Allens test (pass/fail) PASS PASS    Comment: Performed at [facility]  MRSA PCR Screening     Status: None   Collection Time: 01/11/16  6:00 PM  Result Value Ref Range   MRSA by PCR NEGATIVE NEGATIVE    Comment:        The GeneXpert MRSA Assay  (FDA approved for NASAL specimens only), is one component of a comprehensive MRSA colonization surveillance program. It is not intended to diagnose MRSA infection nor to guide or monitor treatment for MRSA infections.   Glucose, capillary     Status: Abnormal   Collection Time: 01/11/16  9:32 PM  Result Value Ref Range   Glucose-Capillary 181 (H) 65 - 99 mg/dL  Comprehensive metabolic panel     Status: Abnormal   Collection Time: 01/12/16  2:48 AM  Result Value Ref Range   Sodium 140 135 - 145 mmol/L   Potassium 2.7 (LL) 3.5 - 5.1 mmol/L    Comment: CRITICAL RESULT CALLED TO, READ BACK BY AND VERIFIED WITH: LUCK C,RN 01/12/16 0404 WAYK    Chloride 107 101 - 111 mmol/L   CO2 22 22 - 32 mmol/L   Glucose, Bld 177 (H) 65 - 99 mg/dL   BUN 74 (H) 6 - 20 mg/dL   Creatinine, Ser 1.49 (H) 0.44 - 1.00 mg/dL   Calcium 8.3 (L) 8.9 - 10.3 mg/dL   Total Protein 5.8 (L) 6.5 - 8.1 g/dL   Albumin 2.6 (L) 3.5 - 5.0 g/dL   AST 27 15 - 41 U/L   ALT 27 14 - 54 U/L   Alkaline Phosphatase 41 38 - 126 U/L   Total Bilirubin 0.5 0.3 - 1.2 mg/dL   GFR calc non Af Amer 30 (L) >60 mL/min  GFR calc Af Amer 35 (L) >60 mL/min    Comment: (NOTE) The eGFR has been calculated using the CKD EPI equation. This calculation has not been validated in all clinical situations. eGFR's persistently <60 mL/min signify possible Chronic Kidney Disease.    Anion gap 11 5 - 15  CBC     Status: Abnormal   Collection Time: 01/12/16  2:48 AM  Result Value Ref Range   WBC 8.8 4.0 - 10.5 K/uL   RBC 4.14 3.87 - 5.11 MIL/uL   Hemoglobin 10.2 (L) 12.0 - 15.0 g/dL   HCT 32.8 (L) 36.0 - 46.0 %   MCV 79.2 78.0 - 100.0 fL   MCH 24.6 (L) 26.0 - 34.0 pg   MCHC 31.1 30.0 - 36.0 g/dL   RDW 15.0 11.5 - 15.5 %   Platelets 295 150 - 400 K/uL  Protime-INR     Status: Abnormal   Collection Time: 01/12/16  2:48 AM  Result Value Ref Range   Prothrombin Time 17.2 (H) 11.6 - 15.2 seconds   INR 1.39 0.00 - 1.49  Troponin I      Status: Abnormal   Collection Time: 01/12/16  2:48 AM  Result Value Ref Range   Troponin I 0.06 (H) <0.031 ng/mL    Comment:        PERSISTENTLY INCREASED TROPONIN VALUES IN THE RANGE OF 0.04-0.49 ng/mL CAN BE SEEN IN:       -UNSTABLE ANGINA       -CONGESTIVE HEART FAILURE       -MYOCARDITIS       -CHEST TRAUMA       -ARRYHTHMIAS       -LATE PRESENTING MYOCARDIAL INFARCTION       -COPD   CLINICAL FOLLOW-UP RECOMMENDED.   Magnesium     Status: None   Collection Time: 01/12/16  2:48 AM  Result Value Ref Range   Magnesium 2.3 1.7 - 2.4 mg/dL  Glucose, capillary     Status: Abnormal   Collection Time: 01/12/16  7:30 AM  Result Value Ref Range   Glucose-Capillary 176 (H) 65 - 99 mg/dL  Potassium     Status: Abnormal   Collection Time: 01/12/16  9:42 AM  Result Value Ref Range   Potassium 2.8 (L) 3.5 - 5.1 mmol/L  Troponin I     Status: Abnormal   Collection Time: 01/12/16 11:03 AM  Result Value Ref Range   Troponin I 0.05 (H) <0.031 ng/mL    Comment:        PERSISTENTLY INCREASED TROPONIN VALUES IN THE RANGE OF 0.04-0.49 ng/mL CAN BE SEEN IN:       -UNSTABLE ANGINA       -CONGESTIVE HEART FAILURE       -MYOCARDITIS       -CHEST TRAUMA       -ARRYHTHMIAS       -LATE PRESENTING MYOCARDIAL INFARCTION       -COPD   CLINICAL FOLLOW-UP RECOMMENDED.   Glucose, capillary     Status: Abnormal   Collection Time: 01/12/16 11:12 AM  Result Value Ref Range   Glucose-Capillary 219 (H) 65 - 99 mg/dL    Ct Abdomen Pelvis Wo Contrast  01/12/2016  CLINICAL DATA:  Diffuse abdominal pain, nausea, and vomiting beginning today Hx: CAD, Diabetes, HTN, Cardiac cath, appendectomy EXAM: CT ABDOMEN AND PELVIS WITHOUT CONTRAST TECHNIQUE: Multidetector CT imaging of the abdomen and pelvis was performed following the standard protocol without IV contrast. COMPARISON:  03/13/2013 FINDINGS: Lower chest: Bilateral  pleural effusions are present. Bibasilar atelectasis right greater than left. Small  pericardial effusion is 7 mm. Aortic valve calcification. Coronary artery calcifications. Upper abdomen: No focal mass identified within the liver or adrenal glands. The pancreas is atrophic without focal mass. And oval low-density mass is identified within the medial aspect of spleen, measuring 11 mm. This cannot be further characterized. Small bilateral renal masses are present. No hydronephrosis. Gallbladder is present. Gastrointestinal tract: Distal esophagus is distended by fluid. The stomach is markedly distended with fluid and air. Small bowel loops are markedly distended. Fecal type material is identified within the distal dilated loops. The most distal ileal loops are not dilated and are entirely collapsed. Colonic loops are collapsed. There are scattered diverticula. No acute diverticulitis. The transition zone is within the right lower quadrant. A small bowel loop abruptly tapers to normal caliber and is best seen on image 57 of series 204, a sagittal root projection. Pelvis: The uterus is absent. No free pelvic fluid. Urinary bladder is distended. Retroperitoneum: There is dense atherosclerotic calcification of the abdominal aorta. Abdominal wall: Unremarkable. Osseous structures: Unremarkable. IMPRESSION: 1. High-grade distal small-bowel obstruction likely related to adhesions. 2. Diverticulosis without acute diverticulitis. 3. Bilateral pleural effusions. 4. Bibasilar atelectasis. 5. Small pericardial effusion. 6. Distal esophagus filled with fluid, likely related to the obstruction. 7. Status post hysterectomy. 8. Small low-attenuation lesion within the spleen, favored to be benign but not fully characterized. Electronically Signed   By: Nolon Nations M.D.   On: 01/12/2016 12:31   X-ray Chest Pa And Lateral  01/12/2016  CLINICAL DATA:  Sepsis.  Shortness of breath. EXAM: CHEST  2 VIEW COMPARISON:  Chest radiograph from one day prior. FINDINGS: Stable cardiomediastinal silhouette with top-normal  heart size. No pneumothorax. Stable trace right pleural effusion. No pulmonary edema. Stable mild patchy right lower lobe opacity. IMPRESSION: 1. Stable mild patchy right lower lobe opacity, which could represent aspiration, atelectasis or pneumonia. 2. Stable trace right pleural effusion. Electronically Signed   By: Ilona Sorrel M.D.   On: 01/12/2016 08:14   Dg Chest Port 1 View  01/11/2016  CLINICAL DATA:  Shortness of breath. Nausea and weakness for the past 4 days. Vomiting for 1 day. EXAM: PORTABLE CHEST 1 VIEW COMPARISON:  09/18/2015; 07/15/2015; 01/20/2015; 01/18/2014 FINDINGS: Grossly unchanged cardiac silhouette and mediastinal contours given reduced lung volumes, AP projection and patient rotation. Minimal bilateral infrahilar opacities favored to represent atelectasis. There is unchanged mild diffuse slightly nodular thickening of the pulmonary station. No pleural effusion pneumothorax. No evidence of edema. No acute osseous abnormalities. IMPRESSION: Chronic bronchitic change and bilateral infrahilar atelectasis without definitive superimposed acute cardiopulmonary disease on this hypoventilated AP portable examination. Electronically Signed   By: Sandi Mariscal M.D.   On: 01/11/2016 07:38   Dg Abd Portable 1v  01/12/2016  CLINICAL DATA:  Verify placement of nasogastric tube P EXAM: PORTABLE ABDOMEN - 1 VIEW COMPARISON:  CT abdomen from earlier same day. FINDINGS: Nasogastric tube is in the stomach. Stomach remains distended with air. Visualized small bowel remains distended. No evidence of free intraperitoneal air. IMPRESSION: Nasogastric tube in the stomach. Proximal side holes are approximately 7 cm below the level of the GE junction. Electronically Signed   By: Franki Cabot M.D.   On: 01/12/2016 14:25    Review of Systems  Constitutional: Negative.   HENT: Negative.   Eyes: Negative.   Respiratory: Negative.   Cardiovascular: Negative.   Gastrointestinal: Positive for nausea, vomiting  and abdominal pain.  She does not know when she had her last BM.    Genitourinary: Negative.   Musculoskeletal: Negative.   Skin: Negative.   Neurological: Negative.   Endo/Heme/Allergies: Negative.   Psychiatric/Behavioral: Negative.    Blood pressure 121/44, pulse 110, temperature 98.5 F (36.9 C), temperature source Axillary, resp. rate 24, height 5' 1"  (1.549 m), weight 85.684 kg (188 lb 14.4 oz), SpO2 98 %. Physical Exam  Constitutional: She is oriented to person, place, and time.  Ill 82 y/o woman with NG in place not working.  It is in but is small, I have worked with it and it's still not working.  I am going to send her down to [facility] and see if they can get it in properly  HENT:  Head: Normocephalic.  Small NG in place, but not working.  I repositioned it, and I replace sump filter, I can hand aspirate fluid but it will not drain.    Eyes: Right eye exhibits no discharge. Left eye exhibits no discharge. No scleral icterus.  Neck: Neck supple. No JVD present. No tracheal deviation present. No thyromegaly present.  Cardiovascular: Normal rate, regular rhythm, normal heart sounds and intact distal pulses.   No murmur heard. Respiratory: No respiratory distress. She has wheezes. She has rales.  GI: Soft. Bowel sounds are normal. She exhibits distension. She exhibits no mass. There is no tenderness. There is no rebound and no guarding.  Distended, no BS, scar lower midline abdomen    Musculoskeletal: She exhibits no edema or tenderness.  Lymphadenopathy:    She has no cervical adenopathy.  Neurological: She is alert and oriented to person, place, and time. No cranial nerve deficit.  Skin: Skin is warm and dry. No rash noted. No erythema.  Psychiatric: She has a normal mood and affect. Her behavior is normal. Judgment and thought content normal.    Assessment/Plan: LLL pneumonia SBO with prior abdominal surgery Hypokalemia AODM CAD Hypertension Osteoarthritis Body mass  index is 35.7  Plan:  I have ask Radiology to get her a better and functional NG.  If we get her adequately decompressed we can decide on getting a SB protocol started on her tomorrow.      [name] 01/12/2016, 2:57 PM

## 2016-01-12 NOTE — Progress Notes (Addendum)
Shift event note:  Notified by RN that pt's HR trending up this evening. Has been 110-130's since admission this evening for sepsis related to LLL PNA. Now HR 150-160's w/ runs in the 170-180's. RN reports pt asymptomatic except for indigestion type discomfort and nausea which pt has had since admission. No reported CP. EKG obtained reveals ST w/ rate of 159. RR RN was paged and responded to bedside. HR was not significantly improved after 500 cc NS bolus. HR remains 160-170's. Pt was given Lopressor 5 mg IV. At bedside pt notes awake and alert and oriented to person. She denies CP but does report mild nausea and epigastric discomfort  that to her feels like indigestion. Neither symptoms are new. Abd is distented but soft w/ TTP over the epigastrium and RUQ and bs in all quadrants.Pt is not diaphoretic and appears in no acute distress. HR after IV lopressor 118-120's.  Assessment/Plan: 1. Persistent tachycardia: Physiological response to sepsis. Improved after small dose of IV Lopressor. Will repeat NS 500 cc IVF bolus. No further changes in current plan. Will add Mg to am labs.  2. Epigastric /RUQ abd pain: Pt w/ known h/o GERD. No h/o gallbladder disease. Will give GI cocktail and monitor. Consider abd u/s if symptoms persist. Will continue to monitor closley in SDU.   Jeryl Columbia, NP-C [facility] Pager [phone]  0400:  RN called to report pt now sleeping, HR-90's to 100. K+ 2.7. Will repelete. Mag pending.

## 2016-01-12 NOTE — Evaluation (Addendum)
Physical Therapy Evaluation Patient Details Name: [name] MRN: [id number] DOB: February 24, 1925 Today's Date: 01/12/2016   History of Present Illness  82 y.o. female adm with 3-4d of SOB, also with abd distension, found to have SBO d/y adhesion;    PMHx: Asthma not on home oxygen, CAD, DM2, hypothyroidism, GERD, CAD, OA, R TKA  Clinical Impression  Pt admitted with above diagnosis. Pt currently with functional limitations due to the deficits listed below (see PT Problem List).  Pt will benefit from skilled PT to increase their independence and safety with mobility to allow discharge to the venue listed below.    Pt will likely need SNF post acute, pt with limited mobility at home per pt brother; [name] limited this date d/t issues with NGT suctioning and GI PA came in to attempt to resolve this, incr HR earlier to day but stable in low 100s during bed mobility and strength testing/ROM; will continue to follow     Follow Up Recommendations SNF    Equipment Recommendations  None recommended by PT    Recommendations for Other Services       Precautions / Restrictions Precautions Precautions: Fall;Other (comment) Precaution Comments: NGT, monitor HR Restrictions Weight Bearing Restrictions: No      Mobility  Bed Mobility Overal bed mobility: Needs Assistance Bed Mobility: Rolling Rolling: Mod assist         General bed mobility comments: partial roll; effortful, difficult d/t body habitus and further distended abd  Transfers                 General transfer comment: NT at this time d/t GI PA working NGT and pt feeling too poorly  Ambulation/Gait                Financial trader Rankin (Stroke Patients Only)       Balance                                             Pertinent Vitals/Pain Pain Assessment: Faces Faces Pain Scale: Hurts a little bit Pain Location: abd Pain Descriptors /  Indicators: Sore Pain Intervention(s): Limited activity within patient's tolerance;Monitored during session    Home Living Family/patient expects to be discharged to:: Private residence Living Arrangements: Alone Available Help at Discharge: Family (has son that comes in and out) Type of Home: House (condo) Home Access: Stairs to enter   Technical brewer of Steps: 1 Home Layout: One level Home Equipment: Environmental consultant - 4 wheels;Cane - single point      Prior Function Level of Independence: Independent with assistive device(s);Needs assistance   Gait / Transfers Assistance Needed: has RW but likes to furniture walk at home per pt brother; also per pt brother [name] does not move around much--she primarily stays in her lift chair, sleeps in the bed at night  ADL's / Homemaking Assistance Needed: pt reports she sponge bathes;   Comments: son assists with meals, groceries, laundry     Hand Dominance        Extremity/Trunk Assessment   Upper Extremity Assessment: Generalized weakness           Lower Extremity Assessment: RLE deficits/detail RLE Deficits / Details: flexion limited to ~ 50*, pt reported difficulty since TKA  Communication   Communication: No difficulties  Cognition Arousal/Alertness: Awake/alert Behavior During Therapy: WFL for tasks assessed/performed Overall Cognitive Status: Within Functional Limits for tasks assessed                      General Comments      Exercises        Assessment/Plan    PT Assessment Patient needs continued PT services  PT Diagnosis Generalized weakness   PT Problem List Decreased strength;Decreased activity tolerance;Decreased mobility;Decreased range of motion  PT Treatment Interventions DME instruction;Gait training;Functional mobility training;Therapeutic activities;Therapeutic exercise;Patient/family education   PT Goals (Current goals can be found in the Care Plan section) Acute Rehab PT  Goals Patient Stated Goal: does not state PT Goal Formulation: With patient Time For Goal Achievement: 01/19/16 Potential to Achieve Goals: Fair    Frequency Min 3X/week   Barriers to discharge  decr caregiver support      Co-evaluation               End of Session   Activity Tolerance: Patient limited by fatigue;Other (comment) (N & V) Patient left: in bed;with call bell/phone within reach;with bed alarm set           Time: [id number] PT Time Calculation (min) (ACUTE ONLY): 27 min   Charges:   PT Evaluation $PT Eval Moderate Complexity: 1 Procedure PT Treatments $Therapeutic Activity: 8-22 mins   PT G Codes:        Tonette Koehne 01/18/2016, 4:13 PM

## 2016-01-12 NOTE — Progress Notes (Signed)
Pt has NG tube, still NPO. Paged Dr. Rogue Bussing regarding medications. Stated to hold meds for now.

## 2016-01-12 NOTE — Progress Notes (Signed)
MD Karleen Hampshire Notified of pt c/o of abd pain, nausea & bloating (abd girth measured 45 in). LBM was 01/09/2016.  Hoover Brunette, RN

## 2016-01-12 NOTE — Progress Notes (Signed)
PROGRESS NOTE    Deleena Oconnor Hulme  [id number] DOB: 1925-01-04 DOA: 01/11/2016 PCP: [name], MD    Brief Narrative: [name] is a 82 y.o. female with a Past Medical History of asthma, CAD, DM, hypogonadism, GERD, CAD, who presents with sepsis from CAP and possible UTI. She was also found to have SBO from adhesion.   Assessment & Plan:   Principal Problem:   Sepsis due to pneumonia [facility]) Active Problems:   Diabetes mellitus type 2, controlled ([place])   Coronary atherosclerosis   Asthma with bronchitis   HTN (hypertension)   HLD (hyperlipidemia)   Hypothyroid   GERD (gastroesophageal reflux disease)   Diabetes mellitus type 2 without retinopathy ([place])   Sepsis ([place])   Acute on chronic respiratory failure ([place])   Sepsis from the  Right lower lobe pneumonia; Admitted to SDU, and currently on IV antibiotics.  [place] oxygen to keep sats greater than 90%. Normal lactic acid and minimal procalcitonin.  Wbc count normalized.   Abdominal distention, nausea, vomiting ; CT shows evidence of high grade SBO. Made her NPO, except for meds and ice chips. Surgery consutled and plan for NG tube with intermittent suction.    Diabetes mellitus: Optimal control.  hgba1c is around 7.  CBG (last 3)   Recent Labs  01/11/16 2132 01/12/16 0730 01/12/16 1112  GLUCAP 181* 176* 219*    Resume SSI.    Hypertension; Controlled.    Hypothyroidism: Resume synthroid.   Hypokalemia: Replete as needed and repeat in am.       DVT prophylaxis: /SCD's/ Code Status: (Full) Family Communication: son at bedside Disposition Plan: pending further management.    Consultants:   surgery   Procedures:   CT abd and pelvis.    Antimicrobials:   Rocephin 5/24  zithromax 5/24   Subjective: abd pain ,nausea and vomiting once today.   Objective: Filed Vitals:   01/12/16 0300 01/12/16 0400 01/12/16 0821 01/12/16 0837  BP: 117/87 139/63 136/78   Pulse: 102 103 119 102    Temp:  98.7 F (37.1 C) 98.3 F (36.8 C)   TempSrc:  Oral Oral   Resp: 23 23  24   Height:      Weight:      SpO2: 96% 97% 96% 97%    Intake/Output Summary (Last 24 hours) at 01/12/16 1303 Last data filed at 01/12/16 0738  Gross per 24 hour  Intake 1633.75 ml  Output    850 ml  Net 783.75 ml   Filed Weights   01/11/16 1715  Weight: 85.684 kg (188 lb 14.4 oz)    Examination:  General exam: Appears UNCOMFORTABLE.   Respiratory system: diminished at bases, no wheezing or rhonchi. Respiratory effort normal. Cardiovascular system: S1 & S2 heard, RRR. No JVD, murmurs, rubs, gallops or clicks. No pedal edema. Gastrointestinal system: Abdomen distended, soft mildly generalized tenderness, . No organomegaly or masses felt. Decreased bowel sounds.  Central nervous system: Alert and oriented. No new focal neurological deficits. Extremities: Symmetric 5 x 5 power. Skin: No rashes, lesions or ulcers Psychiatry: Judgement and insight appear normal. Mood & affect appropriate.     Data Reviewed: I have personally reviewed following labs and imaging studies  CBC:  Recent Labs Lab 01/11/16 0727 01/11/16 0742 01/11/16 1225 01/12/16 0248  WBC 13.8*  --  10.7* 8.8  NEUTROABS 10.3*  --  7.3  --   HGB 10.9* 12.2 10.4* 10.2*  HCT 35.2* 36.0 32.9* 32.8*  MCV 80.7  --  79.3 79.2  PLT 292  --  256 [id number]   Basic Metabolic Panel:  Recent Labs Lab 01/11/16 0727 01/11/16 0742 01/11/16 1225 01/12/16 0248 01/12/16 0942  NA 137 140 137 140  --   K 3.5 3.5 3.0* 2.7* 2.8*  CL 98* 100* 104 107  --   CO2 24  --  22 22  --   GLUCOSE 210* 201* 198* 177*  --   BUN 97* 92* 90* 74*  --   CREATININE 2.49* 2.60* 2.07* 1.49*  --   CALCIUM 9.1  --  8.3* 8.3*  --   MG  --   --   --  2.3  --    GFR: Estimated Creatinine Clearance: 26 mL/min (by C-G formula based on Cr of 1.49). Liver Function Tests:  Recent Labs Lab 01/11/16 0727 01/11/16 1225 01/12/16 0248  AST 20 19 27   ALT 17 16 27    ALKPHOS 43 42 41  BILITOT 0.5 0.4 0.5  PROT 6.2* 5.7* 5.8*  ALBUMIN 3.0* 2.7* 2.6*   No results for input(s): LIPASE, AMYLASE in the last 168 hours. No results for input(s): AMMONIA in the last 168 hours. Coagulation Profile:  Recent Labs Lab 01/11/16 1225 01/12/16 0248  INR 1.34 1.39   Cardiac Enzymes:  Recent Labs Lab 01/12/16 0248 01/12/16 1103  TROPONINI 0.06* 0.05*   BNP (last 3 results) No results for input(s): PROBNP in the last 8760 hours. HbA1C:  Recent Labs  01/11/16 1225  HGBA1C 7.4*   CBG:  Recent Labs Lab 01/11/16 2132 01/12/16 0730 01/12/16 1112  GLUCAP 181* 176* 219*   Lipid Profile: No results for input(s): CHOL, HDL, LDLCALC, TRIG, CHOLHDL, LDLDIRECT in the last 72 hours. Thyroid Function Tests: No results for input(s): TSH, T4TOTAL, FREET4, T3FREE, THYROIDAB in the last 72 hours. Anemia Panel: No results for input(s): VITAMINB12, FOLATE, FERRITIN, TIBC, IRON, RETICCTPCT in the last 72 hours. Sepsis Labs:  Recent Labs Lab 01/11/16 0742 01/11/16 1100 01/11/16 1225 01/11/16 1533  PROCALCITON  --   --  0.26  --   LATICACIDVEN 1.90 1.08 1.5 1.2    Recent Results (from the past 240 hour(s))  Urine culture     Status: None   Collection Time: 01/11/16  9:57 AM  Result Value Ref Range Status   Specimen Description URINE, CATHETERIZED  Final   Special Requests NONE  Final   Culture NO GROWTH  Final   Report Status 01/12/2016 FINAL  Final  MRSA PCR Screening     Status: None   Collection Time: 01/11/16  6:00 PM  Result Value Ref Range Status   MRSA by PCR NEGATIVE NEGATIVE Final    Comment:        The GeneXpert MRSA Assay (FDA approved for NASAL specimens only), is one component of a comprehensive MRSA colonization surveillance program. It is not intended to diagnose MRSA infection nor to guide or monitor treatment for MRSA infections.          Radiology Studies: Ct Abdomen Pelvis Wo Contrast  01/12/2016  CLINICAL  DATA:  Diffuse abdominal pain, nausea, and vomiting beginning today Hx: CAD, Diabetes, HTN, Cardiac cath, appendectomy EXAM: CT ABDOMEN AND PELVIS WITHOUT CONTRAST TECHNIQUE: Multidetector CT imaging of the abdomen and pelvis was performed following the standard protocol without IV contrast. COMPARISON:  03/13/2013 FINDINGS: Lower chest: Bilateral pleural effusions are present. Bibasilar atelectasis right greater than left. Small pericardial effusion is 7 mm. Aortic valve calcification. Coronary artery calcifications. Upper abdomen: No focal mass  identified within the liver or adrenal glands. The pancreas is atrophic without focal mass. And oval low-density mass is identified within the medial aspect of spleen, measuring 11 mm. This cannot be further characterized. Small bilateral renal masses are present. No hydronephrosis. Gallbladder is present. Gastrointestinal tract: Distal esophagus is distended by fluid. The stomach is markedly distended with fluid and air. Small bowel loops are markedly distended. Fecal type material is identified within the distal dilated loops. The most distal ileal loops are not dilated and are entirely collapsed. Colonic loops are collapsed. There are scattered diverticula. No acute diverticulitis. The transition zone is within the right lower quadrant. A small bowel loop abruptly tapers to normal caliber and is best seen on image 57 of series 204, a sagittal root projection. Pelvis: The uterus is absent. No free pelvic fluid. Urinary bladder is distended. Retroperitoneum: There is dense atherosclerotic calcification of the abdominal aorta. Abdominal wall: Unremarkable. Osseous structures: Unremarkable. IMPRESSION: 1. High-grade distal small-bowel obstruction likely related to adhesions. 2. Diverticulosis without acute diverticulitis. 3. Bilateral pleural effusions. 4. Bibasilar atelectasis. 5. Small pericardial effusion. 6. Distal esophagus filled with fluid, likely related to the  obstruction. 7. Status post hysterectomy. 8. Small low-attenuation lesion within the spleen, favored to be benign but not fully characterized. Electronically Signed   By: Nolon Nations M.D.   On: 01/12/2016 12:31   X-ray Chest Pa And Lateral  01/12/2016  CLINICAL DATA:  Sepsis.  Shortness of breath. EXAM: CHEST  2 VIEW COMPARISON:  Chest radiograph from one day prior. FINDINGS: Stable cardiomediastinal silhouette with top-normal heart size. No pneumothorax. Stable trace right pleural effusion. No pulmonary edema. Stable mild patchy right lower lobe opacity. IMPRESSION: 1. Stable mild patchy right lower lobe opacity, which could represent aspiration, atelectasis or pneumonia. 2. Stable trace right pleural effusion. Electronically Signed   By: Ilona Sorrel M.D.   On: 01/12/2016 08:14   Dg Chest Port 1 View  01/11/2016  CLINICAL DATA:  Shortness of breath. Nausea and weakness for the past 4 days. Vomiting for 1 day. EXAM: PORTABLE CHEST 1 VIEW COMPARISON:  09/18/2015; 07/15/2015; 01/20/2015; 01/18/2014 FINDINGS: Grossly unchanged cardiac silhouette and mediastinal contours given reduced lung volumes, AP projection and patient rotation. Minimal bilateral infrahilar opacities favored to represent atelectasis. There is unchanged mild diffuse slightly nodular thickening of the pulmonary station. No pleural effusion pneumothorax. No evidence of edema. No acute osseous abnormalities. IMPRESSION: Chronic bronchitic change and bilateral infrahilar atelectasis without definitive superimposed acute cardiopulmonary disease on this hypoventilated AP portable examination. Electronically Signed   By: Sandi Mariscal M.D.   On: 01/11/2016 07:38        Scheduled Meds: . aspirin EC  81 mg Oral QHS  . azithromycin  500 mg Intravenous Q24H  . cefTRIAXone (ROCEPHIN)  IV  1 g Intravenous Q24H  . clopidogrel  75 mg Oral QPM  . clorazepate  15 mg Oral QHS  . ezetimibe  10 mg Oral Daily  . insulin aspart  0-9 Units  Subcutaneous TID WC  . levothyroxine  25 mcg Oral QAC breakfast  . metoprolol succinate  100 mg Oral Daily  . montelukast  10 mg Oral QHS  . pantoprazole  40 mg Oral Daily  . potassium chloride  10 mEq Intravenous Q1 Hr x 2  . potassium chloride  40 mEq Oral BID  . QUEtiapine  50 mg Oral Q breakfast  . rosuvastatin  20 mg Oral QHS  . sodium chloride flush  3 mL Intravenous Q12H  Continuous Infusions: . sodium chloride 75 mL/hr at 01/12/16 0700     LOS: 1 day    Time spent: [place], MD [facility] Pager [phone]  If 7PM-7AM, please contact night-coverage [email or web address] Password [email or web address] 01/12/2016, 1:03 PM

## 2016-01-12 NOTE — Progress Notes (Signed)
PT Cancellation Note  Patient Details Name: [name] MRN: [id number] DOB: [birth year]   Cancelled Treatment:    Reason Eval/Treat Not Completed: Medical issues which prohibited therapy; pt with persistent tachycardia and incr HR, K+ 2.8 without replacement at this time; will attempt again as schedule allows;    [facility] 01/12/2016, 10:42 AM

## 2016-01-13 ENCOUNTER — Inpatient Hospital Stay (HOSPITAL_COMMUNITY): Payer: Medicare Other

## 2016-01-13 DIAGNOSIS — E039 Hypothyroidism, unspecified: Secondary | ICD-10-CM

## 2016-01-13 LAB — BASIC METABOLIC PANEL
ANION GAP: 7 (ref 5–15)
BUN: 59 mg/dL — ABNORMAL HIGH (ref 6–20)
CO2: 24 mmol/L (ref 22–32)
Calcium: 8.2 mg/dL — ABNORMAL LOW (ref 8.9–10.3)
Chloride: 110 mmol/L (ref 101–111)
Creatinine, Ser: 1.21 mg/dL — ABNORMAL HIGH (ref 0.44–1.00)
GFR calc Af Amer: 45 mL/min — ABNORMAL LOW (ref >60)
GFR, EST NON AFRICAN AMERICAN: 39 mL/min — AB (ref >60)
GLUCOSE: 165 mg/dL — AB (ref 65–99)
POTASSIUM: 3.8 mmol/L (ref 3.5–5.1)
Sodium: 141 mmol/L (ref 135–145)

## 2016-01-13 LAB — CBC
HCT: 31.2 % — ABNORMAL LOW (ref 36.0–46.0)
Hemoglobin: 9.7 g/dL — ABNORMAL LOW (ref 12.0–15.0)
MCH: 25.2 pg — ABNORMAL LOW (ref 26.0–34.0)
MCHC: 31.1 g/dL (ref 30.0–36.0)
MCV: 81 fL (ref 78.0–100.0)
PLATELETS: 271 10*3/uL (ref 150–400)
RBC: 3.85 MIL/uL — AB (ref 3.87–5.11)
RDW: 15.3 % (ref 11.5–15.5)
WBC: 6.2 10*3/uL (ref 4.0–10.5)

## 2016-01-13 LAB — GLUCOSE, CAPILLARY
GLUCOSE-CAPILLARY: 171 mg/dL — AB (ref 65–99)
Glucose-Capillary: 115 mg/dL — ABNORMAL HIGH (ref 65–99)
Glucose-Capillary: 121 mg/dL — ABNORMAL HIGH (ref 65–99)
Glucose-Capillary: 149 mg/dL — ABNORMAL HIGH (ref 65–99)
Glucose-Capillary: 156 mg/dL — ABNORMAL HIGH (ref 65–99)
Glucose-Capillary: 161 mg/dL — ABNORMAL HIGH (ref 65–99)

## 2016-01-13 MED ORDER — DIATRIZOATE MEGLUMINE & SODIUM 66-10 % PO SOLN
90.0000 mL | Freq: Once | ORAL | Status: AC
  Administered 2016-01-13: 90 mL via NASOGASTRIC
  Filled 2016-01-13: qty 90

## 2016-01-13 MED ORDER — PHENOL 1.4 % MT LIQD
1.0000 | OROMUCOSAL | Status: DC | PRN
  Administered 2016-01-13 – 2016-01-22 (×2): 1 via OROMUCOSAL
  Filled 2016-01-13 (×2): qty 177

## 2016-01-13 MED ORDER — METOPROLOL TARTRATE 5 MG/5ML IV SOLN
5.0000 mg | Freq: Four times a day (QID) | INTRAVENOUS | Status: DC
  Administered 2016-01-13 – 2016-01-17 (×15): 5 mg via INTRAVENOUS
  Filled 2016-01-13 (×15): qty 5

## 2016-01-13 MED ORDER — LEVOTHYROXINE SODIUM 100 MCG IV SOLR
12.5000 ug | Freq: Every day | INTRAVENOUS | Status: DC
  Administered 2016-01-14 – 2016-01-16 (×3): 12.5 ug via INTRAVENOUS
  Filled 2016-01-13 (×3): qty 5

## 2016-01-13 NOTE — Progress Notes (Signed)
PROGRESS NOTE    [name]  [id number] DOB: 09-23-1924 DOA: 01/11/2016 PCP: [name], MD    Brief Narrative: [name] is a 82 y.o. female with a Past Medical History of asthma, CAD, DM, hypogonadism, GERD, CAD, who presents with sepsis from CAP and possible UTI. She was also found to have SBO from adhesion.   Assessment & Plan:   Principal Problem:   Sepsis due to pneumonia [facility]) Active Problems:   Diabetes mellitus type 2, controlled ([place])   Coronary atherosclerosis   Asthma with bronchitis   HTN (hypertension)   HLD (hyperlipidemia)   Hypothyroid   GERD (gastroesophageal reflux disease)   Diabetes mellitus type 2 without retinopathy ([place])   Sepsis ([place])   Acute on chronic respiratory failure (HCC)   SBO (small bowel obstruction) (H[facility])   Hypokalemia   Sepsis from the  Right lower lobe pneumonia; Admitted to SDU, and currently on IV antibiotics.  [place] oxygen to keep sats greater than 90%. Normal lactic acid and minimal procalcitonin.  Wbc count normalized.   Abdominal distention, nausea, vomiting ; CT shows evidence of high grade SBO. Made her NPO, except for ice chips Surgery consutled and placed NG tube with intermittent suction.  Repeat ABD film ordered today.  SB protocol today.    Diabetes mellitus: Optimal control.  hgba1c is around 7.  CBG (last 3)   Recent Labs  01/13/16 0417 01/13/16 0727 01/13/16 1119  GLUCAP 156* 161* 171*    Resume SSI.    Hypertension; Controlled.    Hypothyroidism: Resume synthroid.   Hypokalemia: Replete as needed and repeat in am is good.    Acute urinary retention: Foley to be placed.    DVT prophylaxis: /SCD's/ Code Status: (Full) Family Communication: son and daughter in law at bedside Disposition Plan: pending further management. Possibly to SNF when stable.    Consultants:   surgery   Procedures:   CT abd and pelvis.    Antimicrobials:   Rocephin 5/24  zithromax  5/24   Subjective: Nausea improved, no vomiting today, not passing flatulence yet.   Objective: Filed Vitals:   01/13/16 0500 01/13/16 0530 01/13/16 0600 01/13/16 0739  BP:    115/59  Pulse: 98 32 101 95  Temp:    97.8 F (36.6 C)  TempSrc:    Oral  Resp: 20 21 19 21   Height:      Weight:   85.458 kg (188 lb 6.4 oz)   SpO2: 97% 98% 97% 98%    Intake/Output Summary (Last 24 hours) at 01/13/16 1133 Last data filed at 01/13/16 0636  Gross per 24 hour  Intake   1845 ml  Output   2060 ml  Net   -215 ml   Filed Weights   01/11/16 1715 01/13/16 0600  Weight: 85.684 kg (188 lb 14.4 oz) 85.458 kg (188 lb 6.4 oz)    Examination:  General exam: Appears UNCOMFORTABLE.   Respiratory system: diminished at bases, no wheezing or rhonchi. Respiratory effort normal. Cardiovascular system: S1 & S2 heard, RRR. No JVD, murmurs, rubs, gallops or clicks. No pedal edema. Gastrointestinal system: Abdomen distended, soft mildly generalized tenderness, . No organomegaly or masses felt. Decreased bowel sounds.  Central nervous system: Alert and oriented. No new focal neurological deficits. Extremities: Symmetric 5 x 5 power. Skin: No rashes, lesions or ulcers Psychiatry: Judgement and insight appear normal. Mood & affect appropriate.     Data Reviewed: I have personally reviewed following labs and imaging studies  CBC:  Recent Labs Lab 01/11/16 0727 01/11/16 0742 01/11/16 1225 01/12/16 0248 01/12/16 1819 01/13/16 0719  WBC 13.8*  --  10.7* 8.8 9.2 6.2  NEUTROABS 10.3*  --  7.3  --   --   --   HGB 10.9* 12.2 10.4* 10.2* 10.6* 9.7*  HCT 35.2* 36.0 32.9* 32.8* 34.6* 31.2*  MCV 80.7  --  79.3 79.2 80.8 81.0  PLT 292  --  256 295 312 [id number]   Basic Metabolic Panel:  Recent Labs Lab 01/11/16 0727 01/11/16 0742 01/11/16 1225 01/12/16 0248 01/12/16 0942 01/12/16 1819 01/13/16 0359  NA 137 140 137 140  --  138 141  K 3.5 3.5 3.0* 2.7* 2.8* 4.0 3.8  CL 98* 100* 104 107  --  106  110  CO2 24  --  22 22  --  23 24  GLUCOSE 210* 201* 198* 177*  --  173* 165*  BUN 97* 92* 90* 74*  --  64* 59*  CREATININE 2.49* 2.60* 2.07* 1.49*  --  1.41* 1.21*  CALCIUM 9.1  --  8.3* 8.3*  --  8.4* 8.2*  MG  --   --   --  2.3  --   --   --    GFR: Estimated Creatinine Clearance: 31.9 mL/min (by C-G formula based on Cr of 1.21). Liver Function Tests:  Recent Labs Lab 01/11/16 0727 01/11/16 1225 01/12/16 0248  AST 20 19 27   ALT 17 16 27   ALKPHOS 43 42 41  BILITOT 0.5 0.4 0.5  PROT 6.2* 5.7* 5.8*  ALBUMIN 3.0* 2.7* 2.6*   No results for input(s): LIPASE, AMYLASE in the last 168 hours. No results for input(s): AMMONIA in the last 168 hours. Coagulation Profile:  Recent Labs Lab 01/11/16 1225 01/12/16 0248  INR 1.34 1.39   Cardiac Enzymes:  Recent Labs Lab 01/12/16 0248 01/12/16 1103 01/12/16 1544  TROPONINI 0.06* 0.05* 0.05*   BNP (last 3 results) No results for input(s): PROBNP in the last 8760 hours. HbA1C:  Recent Labs  01/11/16 1225  HGBA1C 7.4*   CBG:  Recent Labs Lab 01/12/16 2017 01/13/16 0035 01/13/16 0417 01/13/16 0727 01/13/16 1119  GLUCAP 154* 149* 156* 161* 171*   Lipid Profile: No results for input(s): CHOL, HDL, LDLCALC, TRIG, CHOLHDL, LDLDIRECT in the last 72 hours. Thyroid Function Tests: No results for input(s): TSH, T4TOTAL, FREET4, T3FREE, THYROIDAB in the last 72 hours. Anemia Panel: No results for input(s): VITAMINB12, FOLATE, FERRITIN, TIBC, IRON, RETICCTPCT in the last 72 hours. Sepsis Labs:  Recent Labs Lab 01/11/16 0742 01/11/16 1100 01/11/16 1225 01/11/16 1533  PROCALCITON  --   --  0.26  --   LATICACIDVEN 1.90 1.08 1.5 1.2    Recent Results (from the past 240 hour(s))  Blood Culture (routine x 2)     Status: None (Preliminary result)   Collection Time: 01/11/16  7:57 AM  Result Value Ref Range Status   Specimen Description BLOOD LEFT ANTECUBITAL  Final   Special Requests IN PEDIATRIC BOTTLE 2CC  Final    Culture NO GROWTH 1 DAY  Final   Report Status PENDING  Incomplete  Blood Culture (routine x 2)     Status: None (Preliminary result)   Collection Time: 01/11/16  7:58 AM  Result Value Ref Range Status   Specimen Description BLOOD LEFT HAND  Final   Special Requests BOTTLES DRAWN AEROBIC ONLY 5CC  Final   Culture NO GROWTH 1 DAY  Final   Report Status PENDING  Incomplete  Urine culture     Status: None   Collection Time: 01/11/16  9:57 AM  Result Value Ref Range Status   Specimen Description URINE, CATHETERIZED  Final   Special Requests NONE  Final   Culture NO GROWTH  Final   Report Status 01/12/2016 FINAL  Final  MRSA PCR Screening     Status: None   Collection Time: 01/11/16  6:00 PM  Result Value Ref Range Status   MRSA by PCR NEGATIVE NEGATIVE Final    Comment:        The GeneXpert MRSA Assay (FDA approved for NASAL specimens only), is one component of a comprehensive MRSA colonization surveillance program. It is not intended to diagnose MRSA infection nor to guide or monitor treatment for MRSA infections.          Radiology Studies: Ct Abdomen Pelvis Wo Contrast  01/12/2016  CLINICAL DATA:  Diffuse abdominal pain, nausea, and vomiting beginning today Hx: CAD, Diabetes, HTN, Cardiac cath, appendectomy EXAM: CT ABDOMEN AND PELVIS WITHOUT CONTRAST TECHNIQUE: Multidetector CT imaging of the abdomen and pelvis was performed following the standard protocol without IV contrast. COMPARISON:  03/13/2013 FINDINGS: Lower chest: Bilateral pleural effusions are present. Bibasilar atelectasis right greater than left. Small pericardial effusion is 7 mm. Aortic valve calcification. Coronary artery calcifications. Upper abdomen: No focal mass identified within the liver or adrenal glands. The pancreas is atrophic without focal mass. And oval low-density mass is identified within the medial aspect of spleen, measuring 11 mm. This cannot be further characterized. Small bilateral renal  masses are present. No hydronephrosis. Gallbladder is present. Gastrointestinal tract: Distal esophagus is distended by fluid. The stomach is markedly distended with fluid and air. Small bowel loops are markedly distended. Fecal type material is identified within the distal dilated loops. The most distal ileal loops are not dilated and are entirely collapsed. Colonic loops are collapsed. There are scattered diverticula. No acute diverticulitis. The transition zone is within the right lower quadrant. A small bowel loop abruptly tapers to normal caliber and is best seen on image 57 of series 204, a sagittal root projection. Pelvis: The uterus is absent. No free pelvic fluid. Urinary bladder is distended. Retroperitoneum: There is dense atherosclerotic calcification of the abdominal aorta. Abdominal wall: Unremarkable. Osseous structures: Unremarkable. IMPRESSION: 1. High-grade distal small-bowel obstruction likely related to adhesions. 2. Diverticulosis without acute diverticulitis. 3. Bilateral pleural effusions. 4. Bibasilar atelectasis. 5. Small pericardial effusion. 6. Distal esophagus filled with fluid, likely related to the obstruction. 7. Status post hysterectomy. 8. Small low-attenuation lesion within the spleen, favored to be benign but not fully characterized. Electronically Signed   By: Nolon Nations M.D.   On: 01/12/2016 12:31   X-ray Chest Pa And Lateral  01/12/2016  CLINICAL DATA:  Sepsis.  Shortness of breath. EXAM: CHEST  2 VIEW COMPARISON:  Chest radiograph from one day prior. FINDINGS: Stable cardiomediastinal silhouette with top-normal heart size. No pneumothorax. Stable trace right pleural effusion. No pulmonary edema. Stable mild patchy right lower lobe opacity. IMPRESSION: 1. Stable mild patchy right lower lobe opacity, which could represent aspiration, atelectasis or pneumonia. 2. Stable trace right pleural effusion. Electronically Signed   By: Ilona Sorrel M.D.   On: 01/12/2016 08:14    Dg Abd Portable 1v  01/12/2016  CLINICAL DATA:  Verify placement of nasogastric tube P EXAM: PORTABLE ABDOMEN - 1 VIEW COMPARISON:  CT abdomen from earlier same day. FINDINGS: Nasogastric tube is in the stomach. Stomach remains distended with air.  Visualized small bowel remains distended. No evidence of free intraperitoneal air. IMPRESSION: Nasogastric tube in the stomach. Proximal side holes are approximately 7 cm below the level of the GE junction. Electronically Signed   By: Franki Cabot M.D.   On: 01/12/2016 14:25        Scheduled Meds: . aspirin EC  81 mg Oral QHS  . azithromycin  500 mg Intravenous Q24H  . cefTRIAXone (ROCEPHIN)  IV  1 g Intravenous Q24H  . clopidogrel  75 mg Oral QPM  . clorazepate  15 mg Oral QHS  . ezetimibe  10 mg Oral Daily  . insulin aspart  0-9 Units Subcutaneous TID WC  . levothyroxine  25 mcg Oral QAC breakfast  . metoprolol succinate  100 mg Oral Daily  . montelukast  10 mg Oral QHS  . pantoprazole (PROTONIX) IV  40 mg Intravenous Q24H  . QUEtiapine  50 mg Oral Q breakfast  . rosuvastatin  20 mg Oral QHS  . sodium chloride flush  3 mL Intravenous Q12H   Continuous Infusions: . sodium chloride 75 mL/hr at 01/12/16 2355     LOS: 2 days    Time spent: [place], MD [facility] Pager [phone]  If 7PM-7AM, please contact night-coverage [email or web address] Password [email or web address] 01/13/2016, 11:33 AM

## 2016-01-13 NOTE — NC FL2 (Signed)
[place] MEDICAID FL2 LEVEL OF CARE SCREENING TOOL     IDENTIFICATION  Patient Name: [name] Birthdate: Oct 23, 1924 Sex: female Admission Date (Current Location): 01/11/2016  [facility] and [place] Number:  Herbalist and Address:  [facility], [place] [address] [place], [place] [address]      Provider Number: [id number]  Attending Physician Name and Address:  Hosie Poisson, MD  Relative Name and Phone Number:  Delfino Lovett, son, [phone]    Current Level of Care: Hospital Recommended Level of Care: [place] Prior Approval Number:    Date Approved/Denied:   PASRR Number: [id number]  Discharge Plan: SNF    Current Diagnoses: Patient Active Problem List   Diagnosis Date Noted  . SBO (small bowel obstruction) ([place]) 01/12/2016  . Hypokalemia 01/12/2016  . Sepsis ([place]) 01/11/2016  . Acute on chronic respiratory failure ([place]) 01/11/2016  . AKI (acute kidney injury) ([place])   . Diabetes mellitus with complication ([place])   . UTI (lower urinary tract infection)   . Diabetes mellitus type 2 without retinopathy ([place]) 07/15/2015  . Sepsis due to pneumonia ([place]) 07/15/2015  . GERD (gastroesophageal reflux disease) 05/08/2015  . Left bundle branch block 07/31/2014  . Asthma exacerbation 01/18/2014  . Abdominal pain, other specified site 01/19/2013  . Nonspecific abnormal electrocardiogram (ECG) - intermittent BBB 12/01/2012  . HTN (hypertension) 11/30/2012  . HLD (hyperlipidemia) 11/30/2012  . Chest pain 11/30/2012  . PAF - with RVR, converted sponateously 11/30/2012  . Hypothyroid 11/30/2012  . Diabetes mellitus type 2, controlled ([place]) 07/08/2007  . Anxiety 07/08/2007  . Coronary atherosclerosis 07/08/2007  . Asthma with bronchitis 07/08/2007  . Seasonal and perennial allergic rhinitis 07/08/2007  . ARTHRITIS 07/08/2007    Orientation RESPIRATION BLADDER Height & Weight     Self, Time, Situation, Place  O2 (Nasal cannula  2L) Incontinent, Indwelling catheter Weight: 85.458 kg (188 lb 6.4 oz) Height:  5\' 1"  (154.9 cm)  BEHAVIORAL SYMPTOMS/MOOD NEUROLOGICAL BOWEL NUTRITION STATUS      Continent Diet, NG/panda (Please see DC summary; will likely transition to PEG)  AMBULATORY STATUS COMMUNICATION OF NEEDS Skin   Extensive Assist Verbally                         Personal Care Assistance Level of Assistance  Bathing, Feeding, Dressing Bathing Assistance: Maximum assistance Feeding assistance: Maximum assistance Dressing Assistance: Maximum assistance     Functional Limitations Info  Sight Sight Info: Impaired        SPECIAL CARE FACTORS FREQUENCY  PT (By licensed PT)     PT Frequency: min 3x/week              Contractures      Additional Factors Info  Code Status, Allergies Code Status Info: Full Allergies Info: NKA           Current Medications (01/13/2016):  This is the current hospital active medication list Current Facility-Administered Medications  Medication Dose Route Frequency Provider Last Rate Last Dose  . 0.9 %  sodium chloride infusion   Intravenous Continuous Rondel Jumbo, PA-C 75 mL/hr at 01/12/16 2355    . acetaminophen (TYLENOL) tablet 650 mg  650 mg Oral Q6H PRN Rondel Jumbo, PA-C       Or  . acetaminophen (TYLENOL) suppository 650 mg  650 mg Rectal Q6H PRN Rondel Jumbo, PA-C      . azithromycin (ZITHROMAX) 500 mg in dextrose 5 %  250 mL IVPB  500 mg Intravenous Q24H Romona Curls, RPH   500 mg at 01/13/16 1000  . bisacodyl (DULCOLAX) suppository 10 mg  10 mg Rectal Daily PRN Rondel Jumbo, PA-C      . cefTRIAXone (ROCEPHIN) 1 g in dextrose 5 % 50 mL IVPB  1 g Intravenous Q24H Romona Curls, RPH   1 g at 01/13/16 0959  . clorazepate (TRANXENE) tablet 15 mg  15 mg Oral QHS Waldemar Dickens, MD   Stopped at 01/12/16 2243  . diatrizoate meglumine-sodium (GASTROGRAFIN) 66-10 % solution 90 mL  90 mL Per NG tube Once Emina Riebock, NP      . ezetimibe (ZETIA)  tablet 10 mg  10 mg Oral Daily Rondel Jumbo, PA-C   10 mg at 01/12/16 0115  . HYDROcodone-acetaminophen (NORCO/VICODIN) 5-325 MG per tablet 1-2 tablet  1-2 tablet Oral Q4H PRN Rondel Jumbo, PA-C   2 tablet at 01/11/16 2227  . insulin aspart (novoLOG) injection 0-9 Units  0-9 Units Subcutaneous TID WC Rondel Jumbo, PA-C   2 Units at 01/13/16 1253  . ipratropium-albuterol (DUONEB) 0.5-2.5 (3) MG/3ML nebulizer solution 3 mL  3 mL Nebulization Q4H PRN Rondel Jumbo, PA-C      . Derrill Memo ON 01/14/2016] levothyroxine (SYNTHROID, LEVOTHROID) injection 12.5 mcg  12.5 mcg Intravenous Daily Hosie Poisson, MD      . LORazepam (ATIVAN) tablet 0.5-1 mg  0.5-1 mg Oral Q8H PRN Rondel Jumbo, PA-C   0.5 mg at 01/12/16 0152  . metoprolol (LOPRESSOR) injection 5 mg  5 mg Intravenous Q6H Hosie Poisson, MD      . montelukast (SINGULAIR) tablet 10 mg  10 mg Oral QHS Rondel Jumbo, PA-C   Stopped at 01/12/16 2243  . ondansetron (ZOFRAN) tablet 4 mg  4 mg Oral Q6H PRN Rondel Jumbo, PA-C       Or  . ondansetron [facility]) injection 4 mg  4 mg Intravenous Q6H PRN Rondel Jumbo, PA-C   4 mg at 01/12/16 2054  . pantoprazole (PROTONIX) injection 40 mg  40 mg Intravenous Q24H Dianne Dun, NP   40 mg at 01/13/16 0800  . phenol (CHLORASEPTIC) mouth spray 1 spray  1 spray Mouth/Throat PRN Hosie Poisson, MD   1 spray at 01/13/16 1253  . promethazine (PHENERGAN) 6.25 MG/5ML syrup 25 mg  25 mg Oral Q6H PRN Hosie Poisson, MD      . QUEtiapine (SEROQUEL) tablet 50 mg  50 mg Oral Q breakfast Rondel Jumbo, PA-C   50 mg at 01/12/16 [id number]  . rosuvastatin (CRESTOR) tablet 20 mg  20 mg Oral QHS Rondel Jumbo, PA-C   Stopped at 01/12/16 2244  . senna-docusate (Senokot-S) tablet 1 tablet  1 tablet Oral QHS PRN Rondel Jumbo, PA-C      . simethicone [facility]) chewable tablet 80 mg  80 mg Oral Q6H PRN Rondel Jumbo, PA-C   80 mg at 01/12/16 0132  . sodium chloride flush (NS) 0.9 % injection 3 mL  3 mL Intravenous Q12H Rondel Jumbo, PA-C   3 mL at 01/13/16 1254  . traMADol (ULTRAM) tablet 50 mg  50 mg Oral Q6H PRN Rondel Jumbo, PA-C         Discharge Medications: Please see discharge summary for a list of discharge medications.  Relevant Imaging Results:  Relevant Lab Results:   Additional Information SSN [id number]  Benard Halsted, LCSWA

## 2016-01-13 NOTE — Progress Notes (Signed)
Patient ID: [name], female   DOB: 11-Nov-1924, 82 y.o.   MRN: [id number]     [address] [place], [place] [address]    Phone: [phone] FAX: [phone]     Subjective: No n/v.  2061m ngt output.  No flatus or bm.   Objective:  Vital signs:  Filed Vitals:   01/13/16 0530 01/13/16 0600 01/13/16 0739 01/13/16 1231  BP:   115/59 112/68  Pulse: 32 101 95 99  Temp:   97.8 F (36.6 C) 97.9 F (36.6 C)  TempSrc:   Oral Oral  Resp: 21 19 21 20   Height:      Weight:  85.458 kg (188 lb 6.4 oz)    SpO2: 98% 97% 98% 97%    Last BM Date: 01/09/16  Intake/Output   Yesterday:  05/25 0701 - 05/26 0700 In: 1945 [I.V.:1845; IV Piggyback:100] Out: 2510 [Urine:450; Emesis/NG output:2060] This shift:    I/O last 3 completed shifts: In: 3413.8 [I.V.:2613.8; IV Piggyback:800] Out: 2910 [Urine:850; Emesis/NG output:2060]     Physical Exam: General: Pt awake/alert/oriented x4 in no acute distress deformity Abdomen: Soft.  Nondistended.  TTP LLQ without guarding.  No evidence of peritonitis.  No incarcerated hernias.   Problem List:   Principal Problem:   Sepsis due to pneumonia ([facility] Active Problems:   Diabetes mellitus type 2, controlled ([facility]   Coronary atherosclerosis   Asthma with bronchitis   HTN (hypertension)   HLD (hyperlipidemia)   Hypothyroid   GERD (gastroesophageal reflux disease)   Diabetes mellitus type 2 without retinopathy ([facility]   Sepsis ([facility]   Acute on chronic respiratory failure (HCC)   SBO (small bowel obstruction) ([facility]   Hypokalemia    Results:   Labs: Results for orders placed or performed during the hospital encounter of 01/11/16 (from the past 48 hour(s))  Lactic acid, plasma     Status: None   Collection Time: 01/11/16  3:33 PM  Result Value Ref Range   Lactic Acid, Venous 1.2 0.5 - 2.0 mmol/L  Blood gas, arterial     Status: Abnormal   Collection Time: 01/11/16   5:35 PM  Result Value Ref Range   O2 Content 2.0 L/min   Delivery systems NASAL CANNULA    pH, Arterial 7.457 (H) 7.350 - 7.450   pCO2 arterial 32.1 (L) 35.0 - 45.0 mmHg   pO2, Arterial 101 (H) 80.0 - 100.0 mmHg   Bicarbonate 22.4 20.0 - 24.0 mEq/L   TCO2 23.4 0 - 100 mmol/L   Acid-base deficit 1.0 0.0 - 2.0 mmol/L   O2 Saturation 96.9 %   Patient temperature 98.6    Collection site RIGHT RADIAL    Drawn by [id number]   Sample type ARTERIAL DRAW    Allens test (pass/fail) PASS PASS    Comment: [facility] MRSA [facility] Screening     Status: None   Collection Time: 01/11/16  6:00 PM  Result Value Ref Range   MRSA by PCR NEGATIVE NEGATIVE    Comment:        The GeneXpert MRSA Assay (FDA approved for NASAL specimens only), is one component of a comprehensive MRSA colonization surveillance program. It is not intended to diagnose MRSA infection nor to guide or monitor treatment for MRSA infections.   Glucose, capillary     Status: Abnormal   Collection Time: 01/11/16  9:32 PM  Result Value  Ref Range   Glucose-Capillary 181 (H) 65 - 99 mg/dL  Comprehensive metabolic panel     Status: Abnormal   Collection Time: 01/12/16  2:48 AM  Result Value Ref Range   Sodium 140 135 - 145 mmol/L   Potassium 2.7 (LL) 3.5 - 5.1 mmol/L    Comment: CRITICAL RESULT CALLED TO, READ BACK BY AND VERIFIED WITH: LUCK C,RN 01/12/16 0404 WAYK    Chloride 107 101 - 111 mmol/L   CO2 22 22 - 32 mmol/L   Glucose, Bld 177 (H) 65 - 99 mg/dL   BUN 74 (H) 6 - 20 mg/dL   Creatinine, Ser 1.49 (H) 0.44 - 1.00 mg/dL   Calcium 8.3 (L) 8.9 - 10.3 mg/dL   Total Protein 5.8 (L) 6.5 - 8.1 g/dL   Albumin 2.6 (L) 3.5 - 5.0 g/dL   AST 27 15 - 41 U/L   ALT 27 14 - 54 U/L   Alkaline Phosphatase 41 38 - 126 U/L   Total Bilirubin 0.5 0.3 - 1.2 mg/dL   GFR calc non Af Amer 30 (L) >60 mL/min   GFR calc Af Amer 35 (L) >60 mL/min    Comment: (NOTE) The eGFR has been calculated using the CKD  EPI equation. This calculation has not been validated in all clinical situations. eGFR's persistently <60 mL/min signify possible Chronic Kidney Disease.    Anion gap 11 5 - 15  CBC     Status: Abnormal   Collection Time: 01/12/16  2:48 AM  Result Value Ref Range   WBC 8.8 4.0 - 10.5 K/uL   RBC 4.14 3.87 - 5.11 MIL/uL   Hemoglobin 10.2 (L) 12.0 - 15.0 g/dL   HCT 32.8 (L) 36.0 - 46.0 %   MCV 79.2 78.0 - 100.0 fL   MCH 24.6 (L) 26.0 - 34.0 pg   MCHC 31.1 30.0 - 36.0 g/dL   RDW 15.0 11.5 - 15.5 %   Platelets 295 150 - 400 K/uL  Protime-INR     Status: Abnormal   Collection Time: 01/12/16  2:48 AM  Result Value Ref Range   Prothrombin Time 17.2 (H) 11.6 - 15.2 seconds   INR 1.39 0.00 - 1.49  Troponin I     Status: Abnormal   Collection Time: 01/12/16  2:48 AM  Result Value Ref Range   Troponin I 0.06 (H) <0.031 ng/mL    Comment:        PERSISTENTLY INCREASED TROPONIN VALUES IN THE RANGE OF 0.04-0.49 ng/mL CAN BE SEEN IN:       -UNSTABLE ANGINA       -CONGESTIVE HEART FAILURE       -MYOCARDITIS       -CHEST TRAUMA       -ARRYHTHMIAS       -LATE PRESENTING MYOCARDIAL INFARCTION       -COPD   CLINICAL FOLLOW-UP RECOMMENDED.   Magnesium     Status: None   Collection Time: 01/12/16  2:48 AM  Result Value Ref Range   Magnesium 2.3 1.7 - 2.4 mg/dL  Glucose, capillary     Status: Abnormal   Collection Time: 01/12/16  7:30 AM  Result Value Ref Range   Glucose-Capillary 176 (H) 65 - 99 mg/dL  Potassium     Status: Abnormal   Collection Time: 01/12/16  9:42 AM  Result Value Ref Range   Potassium 2.8 (L) 3.5 - 5.1 mmol/L  Troponin I     Status: Abnormal   Collection Time: 01/12/16 11:03 AM  Result Value Ref Range   Troponin I 0.05 (H) <0.031 ng/mL    Comment:        PERSISTENTLY INCREASED TROPONIN VALUES IN THE RANGE OF 0.04-0.49 ng/mL CAN BE SEEN IN:       -UNSTABLE ANGINA       -CONGESTIVE HEART FAILURE       -MYOCARDITIS       -CHEST TRAUMA       -ARRYHTHMIAS        -LATE PRESENTING MYOCARDIAL INFARCTION       -COPD   CLINICAL FOLLOW-UP RECOMMENDED.   Glucose, capillary     Status: Abnormal   Collection Time: 01/12/16 11:12 AM  Result Value Ref Range   Glucose-Capillary 219 (H) 65 - 99 mg/dL  Troponin I     Status: Abnormal   Collection Time: 01/12/16  3:44 PM  Result Value Ref Range   Troponin I 0.05 (H) <0.031 ng/mL    Comment:        PERSISTENTLY INCREASED TROPONIN VALUES IN THE RANGE OF 0.04-0.49 ng/mL CAN BE SEEN IN:       -UNSTABLE ANGINA       -CONGESTIVE HEART FAILURE       -MYOCARDITIS       -CHEST TRAUMA       -ARRYHTHMIAS       -LATE PRESENTING MYOCARDIAL INFARCTION       -COPD   CLINICAL FOLLOW-UP RECOMMENDED.   CBC     Status: Abnormal   Collection Time: 01/12/16  6:19 PM  Result Value Ref Range   WBC 9.2 4.0 - 10.5 K/uL   RBC 4.28 3.87 - 5.11 MIL/uL   Hemoglobin 10.6 (L) 12.0 - 15.0 g/dL   HCT 34.6 (L) 36.0 - 46.0 %   MCV 80.8 78.0 - 100.0 fL   MCH 24.8 (L) 26.0 - 34.0 pg   MCHC 30.6 30.0 - 36.0 g/dL   RDW 15.3 11.5 - 15.5 %   Platelets 312 150 - 400 K/uL  Basic metabolic panel     Status: Abnormal   Collection Time: 01/12/16  6:19 PM  Result Value Ref Range   Sodium 138 135 - 145 mmol/L   Potassium 4.0 3.5 - 5.1 mmol/L    Comment: NO VISIBLE HEMOLYSIS   Chloride 106 101 - 111 mmol/L   CO2 23 22 - 32 mmol/L   Glucose, Bld 173 (H) 65 - 99 mg/dL   BUN 64 (H) 6 - 20 mg/dL   Creatinine, Ser 1.41 (H) 0.44 - 1.00 mg/dL   Calcium 8.4 (L) 8.9 - 10.3 mg/dL   GFR calc non Af Amer 32 (L) >60 mL/min   GFR calc Af Amer 37 (L) >60 mL/min    Comment: (NOTE) The eGFR has been calculated using the CKD EPI equation. This calculation has not been validated in all clinical situations. eGFR's persistently <60 mL/min signify possible Chronic Kidney Disease.    Anion gap 9 5 - 15  Glucose, capillary     Status: Abnormal   Collection Time: 01/12/16  6:45 PM  Result Value Ref Range   Glucose-Capillary 160 (H) 65 - 99 mg/dL   Glucose, capillary     Status: Abnormal   Collection Time: 01/12/16  8:17 PM  Result Value Ref Range   Glucose-Capillary 154 (H) 65 - 99 mg/dL  Glucose, capillary     Status: Abnormal   Collection Time: 01/13/16 12:35 AM  Result Value Ref Range   Glucose-Capillary 149 (H) 65 - 99 mg/dL  Basic metabolic panel     Status: Abnormal   Collection Time: 01/13/16  3:59 AM  Result Value Ref Range   Sodium 141 135 - 145 mmol/L   Potassium 3.8 3.5 - 5.1 mmol/L   Chloride 110 101 - 111 mmol/L   CO2 24 22 - 32 mmol/L   Glucose, Bld 165 (H) 65 - 99 mg/dL   BUN 59 (H) 6 - 20 mg/dL   Creatinine, Ser 1.21 (H) 0.44 - 1.00 mg/dL   Calcium 8.2 (L) 8.9 - 10.3 mg/dL   GFR calc non Af Amer 39 (L) >60 mL/min   GFR calc Af Amer 45 (L) >60 mL/min    Comment: (NOTE) The eGFR has been calculated using the CKD EPI equation. This calculation has not been validated in all clinical situations. eGFR's persistently <60 mL/min signify possible Chronic Kidney Disease.    Anion gap 7 5 - 15  Glucose, capillary     Status: Abnormal   Collection Time: 01/13/16  4:17 AM  Result Value Ref Range   Glucose-Capillary 156 (H) 65 - 99 mg/dL  CBC     Status: Abnormal   Collection Time: 01/13/16  7:19 AM  Result Value Ref Range   WBC 6.2 4.0 - 10.5 K/uL   RBC 3.85 (L) 3.87 - 5.11 MIL/uL   Hemoglobin 9.7 (L) 12.0 - 15.0 g/dL   HCT 31.2 (L) 36.0 - 46.0 %   MCV 81.0 78.0 - 100.0 fL   MCH 25.2 (L) 26.0 - 34.0 pg   MCHC 31.1 30.0 - 36.0 g/dL   RDW 15.3 11.5 - 15.5 %   Platelets 271 150 - 400 K/uL  Glucose, capillary     Status: Abnormal   Collection Time: 01/13/16  7:27 AM  Result Value Ref Range   Glucose-Capillary 161 (H) 65 - 99 mg/dL  Glucose, capillary     Status: Abnormal   Collection Time: 01/13/16 11:19 AM  Result Value Ref Range   Glucose-Capillary 171 (H) 65 - 99 mg/dL    Imaging / Studies: Ct Abdomen Pelvis Wo Contrast  01/12/2016  CLINICAL DATA:  Diffuse abdominal pain, nausea, and vomiting  beginning today Hx: CAD, Diabetes, HTN, Cardiac cath, appendectomy EXAM: CT ABDOMEN AND PELVIS WITHOUT CONTRAST TECHNIQUE: Multidetector CT imaging of the abdomen and pelvis was performed following the standard protocol without IV contrast. COMPARISON:  03/13/2013 FINDINGS: Lower chest: Bilateral pleural effusions are present. Bibasilar atelectasis right greater than left. Small pericardial effusion is 7 mm. Aortic valve calcification. Coronary artery calcifications. Upper abdomen: No focal mass identified within the liver or adrenal glands. The pancreas is atrophic without focal mass. And oval low-density mass is identified within the medial aspect of spleen, measuring 11 mm. This cannot be further characterized. Small bilateral renal masses are present. No hydronephrosis. Gallbladder is present. Gastrointestinal tract: Distal esophagus is distended by fluid. The stomach is markedly distended with fluid and air. Small bowel loops are markedly distended. Fecal type material is identified within the distal dilated loops. The most distal ileal loops are not dilated and are entirely collapsed. Colonic loops are collapsed. There are scattered diverticula. No acute diverticulitis. The transition zone is within the right lower quadrant. A small bowel loop abruptly tapers to normal caliber and is best seen on image 57 of series 204, a sagittal root projection. Pelvis: The uterus is absent. No free pelvic fluid. Urinary bladder is distended. Retroperitoneum: There is dense atherosclerotic calcification of the abdominal aorta. Abdominal wall: Unremarkable. Osseous structures: Unremarkable. IMPRESSION:  1. High-grade distal small-bowel obstruction likely related to adhesions. 2. Diverticulosis without acute diverticulitis. 3. Bilateral pleural effusions. 4. Bibasilar atelectasis. 5. Small pericardial effusion. 6. Distal esophagus filled with fluid, likely related to the obstruction. 7. Status post hysterectomy. 8. Small  low-attenuation lesion within the spleen, favored to be benign but not fully characterized. Electronically Signed   By: Nolon Nations M.D.   On: 01/12/2016 12:31   X-ray Chest Pa And Lateral  01/12/2016  CLINICAL DATA:  Sepsis.  Shortness of breath. EXAM: CHEST  2 VIEW COMPARISON:  Chest radiograph from one day prior. FINDINGS: Stable cardiomediastinal silhouette with top-normal heart size. No pneumothorax. Stable trace right pleural effusion. No pulmonary edema. Stable mild patchy right lower lobe opacity. IMPRESSION: 1. Stable mild patchy right lower lobe opacity, which could represent aspiration, atelectasis or pneumonia. 2. Stable trace right pleural effusion. Electronically Signed   By: Ilona Sorrel M.D.   On: 01/12/2016 08:14   Dg Abd 2 Views  01/13/2016  CLINICAL DATA:  SBO; abd distension EXAM: ABDOMEN - 2 VIEW COMPARISON:  01/12/2016 FINDINGS: Nasogastric tube is in place, tip overlying the level of the stomach. Gas is identified within numerous markedly dilated small bowel loops. Gas is identified within nondilated loops of colon and rectum, consistent with high-grade small bowel obstruction. IMPRESSION: Persistent significant small bowel obstruction. Electronically Signed   By: Nolon Nations M.D.   On: 01/13/2016 12:39   Dg Abd Portable 1v  01/12/2016  CLINICAL DATA:  Verify placement of nasogastric tube P EXAM: PORTABLE ABDOMEN - 1 VIEW COMPARISON:  CT abdomen from earlier same day. FINDINGS: Nasogastric tube is in the stomach. Stomach remains distended with air. Visualized small bowel remains distended. No evidence of free intraperitoneal air. IMPRESSION: Nasogastric tube in the stomach. Proximal side holes are approximately 7 cm below the level of the GE junction. Electronically Signed   By: Franki Cabot M.D.   On: 01/12/2016 14:25    Medications / Allergies:  Scheduled Meds: . aspirin EC  81 mg Oral QHS  . azithromycin  500 mg Intravenous Q24H  . cefTRIAXone (ROCEPHIN)  IV  1 g  Intravenous Q24H  . clopidogrel  75 mg Oral QPM  . clorazepate  15 mg Oral QHS  . diatrizoate meglumine-sodium  90 mL Per NG tube Once  . ezetimibe  10 mg Oral Daily  . insulin aspart  0-9 Units Subcutaneous TID WC  . levothyroxine  25 mcg Oral QAC breakfast  . metoprolol succinate  100 mg Oral Daily  . montelukast  10 mg Oral QHS  . pantoprazole (PROTONIX) IV  40 mg Intravenous Q24H  . QUEtiapine  50 mg Oral Q breakfast  . rosuvastatin  20 mg Oral QHS  . sodium chloride flush  3 mL Intravenous Q12H   Continuous Infusions: . sodium chloride 75 mL/hr at 01/12/16 2355   PRN Meds:.acetaminophen **OR** acetaminophen, benzonatate, bisacodyl, HYDROcodone-acetaminophen, ipratropium-albuterol, LORazepam, ondansetron **OR** ondansetron (ZOFRAN) IV, phenol, promethazine, senna-docusate, simethicone, traMADol  Antibiotics: Anti-infectives    Start     Dose/Rate Route Frequency Ordered Stop   01/12/16 0900  cefTRIAXone (ROCEPHIN) 1 g in dextrose 5 % 50 mL IVPB     1 g 100 mL/hr over 30 Minutes Intravenous Every 24 hours 01/11/16 1251     01/12/16 0900  azithromycin (ZITHROMAX) 500 mg in dextrose 5 % 250 mL IVPB     500 mg 250 mL/hr over 60 Minutes Intravenous Every 24 hours 01/11/16 1251     01/11/16  0830  azithromycin (ZITHROMAX) 500 mg in dextrose 5 % 250 mL IVPB     500 mg 250 mL/hr over 60 Minutes Intravenous  Once 01/11/16 0820 01/11/16 0935   01/11/16 0830  cefTRIAXone (ROCEPHIN) 1 g in dextrose 5 % 50 mL IVPB     1 g 100 mL/hr over 30 Minutes Intravenous  Once 01/11/16 0820 01/11/16 0947        Assessment/Plan SBO-likely secondary to adhesions.  Will start SBO protocol.  Continue NGT, bowel rest, encourage mobilization.  Would hold plavix in case surgical intervention is needed.  Would change PO meds to IV VTE prophylaxis-scd, may have chemical vte from a surgical stanpoint.    Erby Pian, [facility] Surgery Pager [phone]) For consults and floor  pages call [phone])  01/13/2016 1:06 PM

## 2016-01-13 NOTE — Care Management Note (Addendum)
Case Management Note  Patient Details  Name: [name] MRN: [id number] Date of Birth: September 10, 1924  Subjective/Objective:  Pt admitted for LLL Pneumonia. Initiated on IV Rocephin. Pt is from home with family.                   Action/Plan: PT recommendations for SNF. CM will continue to monitor for disposition needs.    Expected Discharge Date:  01/14/16               Expected Discharge Plan:  Skilled Nursing Facility  In-House Referral:  Clinical Social Work  Discharge planning Services  CM Consult  Post Acute Care Choice:   N/A Choice offered to:   N/A  DME Arranged:   N/A DME Agency:   N/A  HH Arranged:   N/A HH Agency:   N/A  Status of Service: Completed Medicare Important Message Given:    Date Medicare IM Given:    Medicare IM give by:    Date Additional Medicare IM Given:    Additional Medicare Important Message give by:     If discussed at [place] of Stay Meetings, dates discussed:    Additional Comments: [place], RN,BSN [phone] CM did speak with CSW and pt has bed available at [facility]. Surgery consulting due to SBO. Will continue to monitor for plan of care.    1522 01-17-16 Jacqlyn Krauss, RN,BSN [phone] Plan for SNF once stable for d/c. CSW assisting with disposition needs.   Bethena Roys, RN 01/13/2016, 9:16 AM

## 2016-01-13 NOTE — Clinical Social Work Note (Signed)
Clinical Social Work Assessment  Patient Details  Name: [name] MRN: [id number] Date of Birth: 1925/03/24  Date of referral:  01/13/16               Reason for consult:  Facility Placement                Permission sought to share information with:  Facility Sport and exercise psychologist, Family Supports Permission granted to share information::  Yes, Verbal Permission Granted  Name::     Richard  Agency::  [facility] SNFs  Relationship::  Son  Contact Information:  [phone]  Housing/Transportation Living arrangements for the past 2 months:  Single Family Home Source of Information:  Patient, Other (Comment Required) (Granddaughter) Patient Interpreter Needed:  None Criminal Activity/Legal Involvement Pertinent to Current Situation/Hospitalization:  No - Comment as needed Significant Relationships:  Adult Children, Other Family Members Lives with:  Self Do you feel safe going back to the place where you live?  No Need for family participation in patient care:  Yes (Comment)  Care giving concerns:  CSW received referral for possible SNF placement at time of discharge. CSW met with patient and patient's granddaughter at bedside regarding PT recommendation of SNF placement at time of discharge. Patient reports being currently unable to care for herself at home given patient's current physical needs and fall risk. Patient and patient's granddaughter expressed understanding of PT recommendation and are agreeable to SNF placement at time of discharge. CSW to continue to follow and assist with discharge planning needs.   Social Worker assessment / plan:  CSW spoke with patient concerning possibility of rehab at [facility] before returning home.  Employment status:  Retired Forensic scientist:  Medicare PT Recommendations:  [place] / Referral to community resources:  [place]  Patient/Family's Response to care:  Patient and patient's  granddaughter recognize need for rehab before returning home and are agreeable to a SNF in [place]. Patient reported preference for [facility] since she has been there before.  Patient/Family's Understanding of and Emotional Response to Diagnosis, Current Treatment, and Prognosis:  Patient is realistic regarding therapy needs. No questions/concerns about plan or treatment.    Emotional Assessment Appearance:  Appears stated age Attitude/Demeanor/Rapport:  Other (Appropriate) Affect (typically observed):  Accepting, Appropriate Orientation:  Oriented to Situation, Oriented to  Time, Oriented to Place, Oriented to Self Alcohol / Substance use:  Not Applicable Psych involvement (Current and /or in the community):  No (Comment)  Discharge Needs  Concerns to be addressed:  Care Coordination Readmission within the last 30 days:  No Current discharge risk:  None Barriers to Discharge:  Continued Medical Work up   [employer], [place] 01/13/2016, 2:51 PM

## 2016-01-14 ENCOUNTER — Inpatient Hospital Stay (HOSPITAL_COMMUNITY): Payer: Medicare Other

## 2016-01-14 LAB — BASIC METABOLIC PANEL
ANION GAP: 10 (ref 5–15)
BUN: 36 mg/dL — ABNORMAL HIGH (ref 6–20)
CALCIUM: 8.6 mg/dL — AB (ref 8.9–10.3)
CO2: 23 mmol/L (ref 22–32)
Chloride: 114 mmol/L — ABNORMAL HIGH (ref 101–111)
Creatinine, Ser: 0.87 mg/dL (ref 0.44–1.00)
GFR calc Af Amer: >60 mL/min (ref >60)
GFR, EST NON AFRICAN AMERICAN: 58 mL/min — AB (ref >60)
GLUCOSE: 142 mg/dL — AB (ref 65–99)
POTASSIUM: 3.5 mmol/L (ref 3.5–5.1)
SODIUM: 147 mmol/L — AB (ref 135–145)

## 2016-01-14 LAB — GLUCOSE, CAPILLARY
GLUCOSE-CAPILLARY: 131 mg/dL — AB (ref 65–99)
GLUCOSE-CAPILLARY: 132 mg/dL — AB (ref 65–99)
GLUCOSE-CAPILLARY: 188 mg/dL — AB (ref 65–99)
Glucose-Capillary: 117 mg/dL — ABNORMAL HIGH (ref 65–99)
Glucose-Capillary: 138 mg/dL — ABNORMAL HIGH (ref 65–99)
Glucose-Capillary: 110 mg/dL — ABNORMAL HIGH (ref 65–99)

## 2016-01-14 MED ORDER — DIPHENHYDRAMINE HCL 50 MG/ML IJ SOLN
12.5000 mg | Freq: Once | INTRAMUSCULAR | Status: AC
  Administered 2016-01-14: 12.5 mg via INTRAVENOUS
  Filled 2016-01-14: qty 1

## 2016-01-14 MED ORDER — POTASSIUM CHLORIDE 10 MEQ/100ML IV SOLN
10.0000 meq | INTRAVENOUS | Status: AC
  Administered 2016-01-14 (×2): 10 meq via INTRAVENOUS
  Filled 2016-01-14 (×2): qty 100

## 2016-01-14 MED ORDER — MORPHINE SULFATE (PF) 2 MG/ML IV SOLN
1.0000 mg | INTRAVENOUS | Status: DC | PRN
  Administered 2016-01-14 – 2016-01-15 (×3): 2 mg via INTRAVENOUS
  Administered 2016-01-18: 1 mg via INTRAVENOUS
  Filled 2016-01-14 (×4): qty 1

## 2016-01-14 MED ORDER — DEXTROSE 5 % IV SOLN
INTRAVENOUS | Status: DC
  Administered 2016-01-14: 10:00:00 via INTRAVENOUS
  Administered 2016-01-14: 75 mL via INTRAVENOUS
  Administered 2016-01-15 (×2): via INTRAVENOUS

## 2016-01-14 NOTE — Clinical Social Work Note (Signed)
CSW met with patient. Family at bedside. CSW told patient that CSW was bringing by a list of SNF's she had been accepted at so far. [place] still pending. Patient stated that she did not want to talk about it right now but allowed CSW to leave list with her.  [place] Scrape, [place]

## 2016-01-14 NOTE — Progress Notes (Addendum)
PROGRESS NOTE    [name]  [id number] DOB: 1925/03/12 DOA: 01/11/2016 PCP: [name], MD    Brief Narrative: [name] is a 82 y.o. female with a Past Medical History of asthma, CAD, DM, hypogonadism, GERD, CAD, who presents with sepsis from CAP and possible UTI. She was also found to have SBO from adhesion.   Assessment & Plan:   Principal Problem:   Sepsis due to pneumonia [facility]) Active Problems:   Diabetes mellitus type 2, controlled ([place])   Coronary atherosclerosis   Asthma with bronchitis   HTN (hypertension)   HLD (hyperlipidemia)   Hypothyroid   GERD (gastroesophageal reflux disease)   Diabetes mellitus type 2 without retinopathy ([place])   Sepsis ([place])   Acute on chronic respiratory failure (HCC)   SBO (small bowel obstruction) (HCC)   Hypokalemia   Sepsis from the  Right lower lobe pneumonia; Admitted to SDU, and currently on IV antibiotics.  [place] oxygen to keep sats greater than 90%. Normal lactic acid and minimal procalcitonin.  Wbc count normalized.   Abdominal distention, nausea, vomiting ; CT shows evidence of high grade SBO. Made her NPO, except for ice chips Surgery consutled and placed NG tube with intermittent suction.  Repeat ABD film ordered showed persistent sbo SB protocol today.  Out of bed.   Hypernatremia: changed fluids to dextrose fluids.     Diabetes mellitus: Optimal control.  hgba1c is around 7.  CBG (last 3)   Recent Labs  01/14/16 0407 01/14/16 0804 01/14/16 1159  GLUCAP 117* 131* 188*    Resume SSI.    Hypertension; Controlled.    Hypothyroidism: Resume synthroid.   Hypokalemia: Replete as needed and repeat in am is good.    Acute urinary retention: Foley to be placed.    DVT prophylaxis: /SCD's/ Code Status: (Full) Family Communication: son at bedside Disposition Plan: pending further management. Possibly to SNF when stable.    Consultants:   surgery   Procedures:   CT abd and pelvis.     Antimicrobials:   Rocephin 5/24  zithromax 5/24   Subjective: Nausea improved, no vomiting today, passing flatulence.   Objective: Filed Vitals:   01/14/16 0800 01/14/16 0805 01/14/16 0928 01/14/16 1200  BP: 128/58 128/58 130/62 134/71  Pulse: 103 98 110 105  Temp:  98.2 F (36.8 C) 98.6 F (37 C)   TempSrc:  Oral Oral   Resp: 17 18 19 22   Height:      Weight:      SpO2: 100% 100% 99% 100%    Intake/Output Summary (Last 24 hours) at 01/14/16 1442 Last data filed at 01/14/16 0315  Gross per 24 hour  Intake   1125 ml  Output   1050 ml  Net     75 ml   Filed Weights   01/11/16 1715 01/13/16 0600 01/14/16 0403  Weight: 85.684 kg (188 lb 14.4 oz) 85.458 kg (188 lb 6.4 oz) 86.955 kg (191 lb 11.2 oz)    Examination:  General exam: Appears UNCOMFORTABLE.   Respiratory system: diminished at bases, no wheezing or rhonchi. Respiratory effort normal. Cardiovascular system: S1 & S2 heard, RRR. No JVD, murmurs, rubs, gallops or clicks. No pedal edema. Gastrointestinal system: Abdomen distended, soft mildly generalized tenderness, . No organomegaly or masses felt. Decreased bowel sounds.  Central nervous system: Alert and oriented. No new focal neurological deficits. Extremities: Symmetric 5 x 5 power. Skin: No rashes, lesions or ulcers Psychiatry: Judgement and insight appear normal. Mood & affect  appropriate.     Data Reviewed: I have personally reviewed following labs and imaging studies  CBC:  Recent Labs Lab 01/11/16 0727 01/11/16 0742 01/11/16 1225 01/12/16 0248 01/12/16 1819 01/13/16 0719  WBC 13.8*  --  10.7* 8.8 9.2 6.2  NEUTROABS 10.3*  --  7.3  --   --   --   HGB 10.9* 12.2 10.4* 10.2* 10.6* 9.7*  HCT 35.2* 36.0 32.9* 32.8* 34.6* 31.2*  MCV 80.7  --  79.3 79.2 80.8 81.0  PLT 292  --  256 295 312 [id number]   Basic Metabolic Panel:  Recent Labs Lab 01/11/16 1225 01/12/16 0248 01/12/16 0942 01/12/16 1819 01/13/16 0359 01/14/16 0604  NA 137 140   --  138 141 147*  K 3.0* 2.7* 2.8* 4.0 3.8 3.5  CL 104 107  --  106 110 114*  CO2 22 22  --  23 24 23   GLUCOSE 198* 177*  --  173* 165* 142*  BUN 90* 74*  --  64* 59* 36*  CREATININE 2.07* 1.49*  --  1.41* 1.21* 0.87  CALCIUM 8.3* 8.3*  --  8.4* 8.2* 8.6*  MG  --  2.3  --   --   --   --    GFR: Estimated Creatinine Clearance: 44.8 mL/min (by C-G formula based on Cr of 0.87). Liver Function Tests:  Recent Labs Lab 01/11/16 0727 01/11/16 1225 01/12/16 0248  AST 20 19 27   ALT 17 16 27   ALKPHOS 43 42 41  BILITOT 0.5 0.4 0.5  PROT 6.2* 5.7* 5.8*  ALBUMIN 3.0* 2.7* 2.6*   No results for input(s): LIPASE, AMYLASE in the last 168 hours. No results for input(s): AMMONIA in the last 168 hours. Coagulation Profile:  Recent Labs Lab 01/11/16 1225 01/12/16 0248  INR 1.34 1.39   Cardiac Enzymes:  Recent Labs Lab 01/12/16 0248 01/12/16 1103 01/12/16 1544  TROPONINI 0.06* 0.05* 0.05*   BNP (last 3 results) No results for input(s): PROBNP in the last 8760 hours. HbA1C: No results for input(s): HGBA1C in the last 72 hours. CBG:  Recent Labs Lab 01/13/16 2107 01/14/16 0027 01/14/16 0407 01/14/16 0804 01/14/16 1159  GLUCAP 115* 132* 117* 131* 188*   Lipid Profile: No results for input(s): CHOL, HDL, LDLCALC, TRIG, CHOLHDL, LDLDIRECT in the last 72 hours. Thyroid Function Tests: No results for input(s): TSH, T4TOTAL, FREET4, T3FREE, THYROIDAB in the last 72 hours. Anemia Panel: No results for input(s): VITAMINB12, FOLATE, FERRITIN, TIBC, IRON, RETICCTPCT in the last 72 hours. Sepsis Labs:  Recent Labs Lab 01/11/16 0742 01/11/16 1100 01/11/16 1225 01/11/16 1533  PROCALCITON  --   --  0.26  --   LATICACIDVEN 1.90 1.08 1.5 1.2    Recent Results (from the past 240 hour(s))  Blood Culture (routine x 2)     Status: None (Preliminary result)   Collection Time: 01/11/16  7:57 AM  Result Value Ref Range Status   Specimen Description BLOOD LEFT ANTECUBITAL  Final    Special Requests IN PEDIATRIC BOTTLE 2CC  Final   Culture NO GROWTH 3 DAYS  Final   Report Status PENDING  Incomplete  Blood Culture (routine x 2)     Status: None (Preliminary result)   Collection Time: 01/11/16  7:58 AM  Result Value Ref Range Status   Specimen Description BLOOD LEFT HAND  Final   Special Requests BOTTLES DRAWN AEROBIC ONLY 5CC  Final   Culture NO GROWTH 3 DAYS  Final   Report Status PENDING  Incomplete  Urine culture     Status: None   Collection Time: 01/11/16  9:57 AM  Result Value Ref Range Status   Specimen Description URINE, CATHETERIZED  Final   Special Requests NONE  Final   Culture NO GROWTH  Final   Report Status 01/12/2016 FINAL  Final  MRSA PCR Screening     Status: None   Collection Time: 01/11/16  6:00 PM  Result Value Ref Range Status   MRSA by PCR NEGATIVE NEGATIVE Final    Comment:        The GeneXpert MRSA Assay (FDA approved for NASAL specimens only), is one component of a comprehensive MRSA colonization surveillance program. It is not intended to diagnose MRSA infection nor to guide or monitor treatment for MRSA infections.          Radiology Studies: Dg Abd 2 Views  01/13/2016  CLINICAL DATA:  SBO; abd distension EXAM: ABDOMEN - 2 VIEW COMPARISON:  01/12/2016 FINDINGS: Nasogastric tube is in place, tip overlying the level of the stomach. Gas is identified within numerous markedly dilated small bowel loops. Gas is identified within nondilated loops of colon and rectum, consistent with high-grade small bowel obstruction. IMPRESSION: Persistent significant small bowel obstruction. Electronically Signed   By: Nolon Nations M.D.   On: 01/13/2016 12:39   Dg Abd Portable 1v-small Bowel Obstruction Protocol-initial, 8 Hr Delay  01/14/2016  CLINICAL DATA:  Small bowel obstruction. Oral contrast at 90.m. last night. Nausea. EXAM: PORTABLE ABDOMEN - 1 VIEW COMPARISON:  01/13/2016 FINDINGS: The contrast is primarily confined to the stomach and  to dilated loops of proximal small bowel. These loops are poorly defined but measure up to 5.5 cm in measurable diameter. There also some gas filled loops of dilated small bowel measuring up to 6.8 cm. The colon contains a tiny amount of gas and does not appear dilated. IMPRESSION: 1. Dilated loops of small bowel with poor progression of contrast medium through the small bowel with compatible with the known small bowel obstruction. Electronically Signed   By: Van Clines M.D.   On: 01/14/2016 08:40        Scheduled Meds: . azithromycin  500 mg Intravenous Q24H  . cefTRIAXone (ROCEPHIN)  IV  1 g Intravenous Q24H  . clorazepate  15 mg Oral QHS  . ezetimibe  10 mg Oral Daily  . insulin aspart  0-9 Units Subcutaneous TID WC  . levothyroxine  12.5 mcg Intravenous Daily  . metoprolol  5 mg Intravenous Q6H  . montelukast  10 mg Oral QHS  . pantoprazole (PROTONIX) IV  40 mg Intravenous Q24H  . [COMPLETED] potassium chloride  10 mEq Intravenous Q1 Hr x 2  . QUEtiapine  50 mg Oral Q breakfast  . rosuvastatin  20 mg Oral QHS  . sodium chloride flush  3 mL Intravenous Q12H   Continuous Infusions: . dextrose 75 mL (01/14/16 1055)     LOS: 3 days    Time spent: [place], MD [facility] Pager [phone]  If 7PM-7AM, please contact night-coverage [email or web address] Password [email or web address] 01/14/2016, 2:42 PM

## 2016-01-14 NOTE — Progress Notes (Signed)
Patient ID: [name], female   DOB: 03-29-1925, 82 y.o.   MRN: [id number]     [address] [place], [place] [address]    Phone: [phone] FAX: [phone]     Subjective: 34m ngt output, passed flatus this AM. No n/v.  Pain unchanged.   Objective:  Vital signs:  Filed Vitals:   01/14/16 0403 01/14/16 0800 01/14/16 0805 01/14/16 0928  BP:  128/58 128/58 130/62  Pulse:  103 98 110  Temp: 98.3 F (36.8 C)  98.2 F (36.8 C) 98.6 F (37 C)  TempSrc:   Oral Oral  Resp:  17 18 19   Height:      Weight: 86.955 kg (191 lb 11.2 oz)     SpO2:  100% 100% 99%    Last BM Date: 01/09/16  Intake/Output   Yesterday:  05/26 0701 - 05/27 0700 In: 18115[I.V.:1005; NG/GT:120] Out: 1050 [Urine:750; Emesis/NG output:300] This shift:    I/O last 3 completed shifts: In: 2970 [I.V.:2850; NG/GT:120] Out: 1610 [Urine:750; Emesis/NG output:860]    Physical Exam: General: Pt awake/alert/oriented x4 in no acute distress deformity Abdomen: Soft. Nondistended. TTP LLQ and RUQ  without guarding. No evidence of peritonitis. No incarcerated hernias.    Problem List:   Principal Problem:   Sepsis due to pneumonia ([facility] Active Problems:   Diabetes [place] type 2, controlled ([place]   Coronary atherosclerosis   Asthma with bronchitis   HTN (hypertension)   HLD (hyperlipidemia)   Hypothyroid   GERD (gastroesophageal reflux disease)   Diabetes mellitus type 2 without retinopathy ([place]   [place]is [place]   Acute on chronic respiratory failure ([place]   SBO (small bowel obstruction) ([place]   Hypokalemia    Results:   Labs: Results for orders placed or performed during the hospital encounter of 01/11/16 (from the past 48 hour(s))  Troponin I     Status: Abnormal   Collection Time: 01/12/16 11:03 AM  Result Value Ref Range   Troponin I 0.05 (H) <0.031 ng/mL    Comment:        PERSISTENTLY INCREASED TROPONIN VALUES  IN THE RANGE OF 0.04-0.49 ng/mL CAN BE SEEN IN:       -UNSTABLE ANGINA       -CONGESTIVE HEART FAILURE       -MYOCARDITIS       -CHEST TRAUMA       -ARRYHTHMIAS       -LATE PRESENTING MYOCARDIAL INFARCTION       -COPD   CLINICAL FOLLOW-UP RECOMMENDED.   Glucose, capillary     Status: Abnormal   Collection Time: 01/12/16 11:12 AM  Result Value Ref Range   Glucose-Capillary 219 (H) 65 - 99 mg/dL  Troponin I     Status: Abnormal   Collection Time: 01/12/16  3:44 PM  Result Value Ref Range   Troponin I 0.05 (H) <0.031 ng/mL    Comment:        PERSISTENTLY INCREASED TROPONIN VALUES IN THE RANGE OF 0.04-0.49 ng/mL CAN BE SEEN IN:       -UNSTABLE ANGINA       -CONGESTIVE HEART FAILURE       -MYOCARDITIS       -CHEST TRAUMA       -ARRYHTHMIAS       -LATE PRESENTING MYOCARDIAL INFARCTION       -COPD   CLINICAL FOLLOW-UP RECOMMENDED.   CBC     Status:  Abnormal   Collection Time: 01/12/16  6:19 PM  Result Value Ref Range   WBC 9.2 4.0 - 10.5 K/uL   RBC 4.28 3.87 - 5.11 MIL/uL   Hemoglobin 10.6 (L) 12.0 - 15.0 g/dL   HCT 34.6 (L) 36.0 - 46.0 %   MCV 80.8 78.0 - 100.0 fL   MCH 24.8 (L) 26.0 - 34.0 pg   MCHC 30.6 30.0 - 36.0 g/dL   RDW 15.3 11.5 - 15.5 %   Platelets 312 150 - 400 K/uL  Basic metabolic panel     Status: Abnormal   Collection Time: 01/12/16  6:19 PM  Result Value Ref Range   Sodium 138 135 - 145 mmol/L   Potassium 4.0 3.5 - 5.1 mmol/L    Comment: NO VISIBLE HEMOLYSIS   Chloride 106 101 - 111 mmol/L   CO2 23 22 - 32 mmol/L   Glucose, Bld 173 (H) 65 - 99 mg/dL   BUN 64 (H) 6 - 20 mg/dL   Creatinine, Ser 1.41 (H) 0.44 - 1.00 mg/dL   Calcium 8.4 (L) 8.9 - 10.3 mg/dL   GFR calc non Af Amer 32 (L) >60 mL/min   GFR calc Af Amer 37 (L) >60 mL/min    Comment: (NOTE) The eGFR has been calculated using the CKD EPI equation. This calculation has not been validated in all clinical situations. eGFR's persistently <60 mL/min signify possible Chronic Kidney Disease.     Anion gap 9 5 - 15  Glucose, capillary     Status: Abnormal   Collection Time: 01/12/16  6:45 PM  Result Value Ref Range   Glucose-Capillary 160 (H) 65 - 99 mg/dL  Glucose, capillary     Status: Abnormal   Collection Time: 01/12/16  8:17 PM  Result Value Ref Range   Glucose-Capillary 154 (H) 65 - 99 mg/dL  Glucose, capillary     Status: Abnormal   Collection Time: 01/13/16 12:35 AM  Result Value Ref Range   Glucose-Capillary 149 (H) 65 - 99 mg/dL  Basic metabolic panel     Status: Abnormal   Collection Time: 01/13/16  3:59 AM  Result Value Ref Range   Sodium 141 135 - 145 mmol/L   Potassium 3.8 3.5 - 5.1 mmol/L   Chloride 110 101 - 111 mmol/L   CO2 24 22 - 32 mmol/L   Glucose, Bld 165 (H) 65 - 99 mg/dL   BUN 59 (H) 6 - 20 mg/dL   Creatinine, Ser 1.21 (H) 0.44 - 1.00 mg/dL   Calcium 8.2 (L) 8.9 - 10.3 mg/dL   GFR calc non Af Amer 39 (L) >60 mL/min   GFR calc Af Amer 45 (L) >60 mL/min    Comment: (NOTE) The eGFR has been calculated using the CKD EPI equation. This calculation has not been validated in all clinical situations. eGFR's persistently <60 mL/min signify possible Chronic Kidney Disease.    Anion gap 7 5 - 15  Glucose, capillary     Status: Abnormal   Collection Time: 01/13/16  4:17 AM  Result Value Ref Range   Glucose-Capillary 156 (H) 65 - 99 mg/dL  CBC     Status: Abnormal   Collection Time: 01/13/16  7:19 AM  Result Value Ref Range   WBC 6.2 4.0 - 10.5 K/uL   RBC 3.85 (L) 3.87 - 5.11 MIL/uL   Hemoglobin 9.7 (L) 12.0 - 15.0 g/dL   HCT 31.2 (L) 36.0 - 46.0 %   MCV 81.0 78.0 - 100.0 fL  MCH 25.2 (L) 26.0 - 34.0 pg   MCHC 31.1 30.0 - 36.0 g/dL   RDW 15.3 11.5 - 15.5 %   Platelets 271 150 - 400 K/uL  Glucose, capillary     Status: Abnormal   Collection Time: 01/13/16  7:27 AM  Result Value Ref Range   Glucose-Capillary 161 (H) 65 - 99 mg/dL  Glucose, capillary     Status: Abnormal   Collection Time: 01/13/16 11:19 AM  Result Value Ref Range    Glucose-Capillary 171 (H) 65 - 99 mg/dL  Glucose, capillary     Status: Abnormal   Collection Time: 01/13/16  4:10 PM  Result Value Ref Range   Glucose-Capillary 121 (H) 65 - 99 mg/dL  Glucose, capillary     Status: Abnormal   Collection Time: 01/13/16  9:07 PM  Result Value Ref Range   Glucose-Capillary 115 (H) 65 - 99 mg/dL  Glucose, capillary     Status: Abnormal   Collection Time: 01/14/16 12:27 AM  Result Value Ref Range   Glucose-Capillary 132 (H) 65 - 99 mg/dL  Glucose, capillary     Status: Abnormal   Collection Time: 01/14/16  4:07 AM  Result Value Ref Range   Glucose-Capillary 117 (H) 65 - 99 mg/dL  Basic metabolic panel     Status: Abnormal   Collection Time: 01/14/16  6:04 AM  Result Value Ref Range   Sodium 147 (H) 135 - 145 mmol/L   Potassium 3.5 3.5 - 5.1 mmol/L   Chloride 114 (H) 101 - 111 mmol/L   CO2 23 22 - 32 mmol/L   Glucose, Bld 142 (H) 65 - 99 mg/dL   BUN 36 (H) 6 - 20 mg/dL   Creatinine, Ser 0.87 0.44 - 1.00 mg/dL   Calcium 8.6 (L) 8.9 - 10.3 mg/dL   GFR calc non Af Amer 58 (L) >60 mL/min   GFR calc Af Amer >60 >60 mL/min    Comment: (NOTE) The eGFR has been calculated using the CKD EPI equation. This calculation has not been validated in all clinical situations. eGFR's persistently <60 mL/min signify possible Chronic Kidney Disease.    Anion gap 10 5 - 15  Glucose, capillary     Status: Abnormal   Collection Time: 01/14/16  8:04 AM  Result Value Ref Range   Glucose-Capillary 131 (H) 65 - 99 mg/dL    Imaging / Studies: Ct Abdomen Pelvis Wo Contrast  01/12/2016  CLINICAL DATA:  Diffuse abdominal pain, nausea, and vomiting beginning today Hx: CAD, Diabetes, HTN, Cardiac cath, appendectomy EXAM: CT ABDOMEN AND PELVIS WITHOUT CONTRAST TECHNIQUE: Multidetector CT imaging of the abdomen and pelvis was performed following the standard protocol without IV contrast. COMPARISON:  03/13/2013 FINDINGS: Lower chest: Bilateral pleural effusions are present.  Bibasilar atelectasis right greater than left. Small pericardial effusion is 7 mm. Aortic valve calcification. Coronary artery calcifications. Upper abdomen: No focal mass identified within the liver or adrenal glands. The pancreas is atrophic without focal mass. And oval low-density mass is identified within the medial aspect of spleen, measuring 11 mm. This cannot be further characterized. Small bilateral renal masses are present. No hydronephrosis. Gallbladder is present. Gastrointestinal tract: Distal esophagus is distended by fluid. The stomach is markedly distended with fluid and air. Small bowel loops are markedly distended. Fecal type material is identified within the distal dilated loops. The most distal ileal loops are not dilated and are entirely collapsed. Colonic loops are collapsed. There are scattered diverticula. No acute diverticulitis. The transition zone  is within the right lower quadrant. A small bowel loop abruptly tapers to normal caliber and is best seen on image 57 of series 204, a sagittal root projection. Pelvis: The uterus is absent. No free pelvic fluid. Urinary bladder is distended. Retroperitoneum: There is dense atherosclerotic calcification of the abdominal aorta. Abdominal wall: Unremarkable. Osseous structures: Unremarkable. IMPRESSION: 1. High-grade distal small-bowel obstruction likely related to adhesions. 2. Diverticulosis without acute diverticulitis. 3. Bilateral pleural effusions. 4. Bibasilar atelectasis. 5. Small pericardial effusion. 6. Distal esophagus filled with fluid, likely related to the obstruction. 7. Status post hysterectomy. 8. Small low-attenuation lesion within the spleen, favored to be benign but not fully characterized. Electronically Signed   By: Nolon Nations M.D.   On: 01/12/2016 12:31   Dg Abd 2 Views  01/13/2016  CLINICAL DATA:  SBO; abd distension EXAM: ABDOMEN - 2 VIEW COMPARISON:  01/12/2016 FINDINGS: Nasogastric tube is in place, tip overlying  the level of the stomach. Gas is identified within numerous markedly dilated small bowel loops. Gas is identified within nondilated loops of colon and rectum, consistent with high-grade small bowel obstruction. IMPRESSION: Persistent significant small bowel obstruction. Electronically Signed   By: Nolon Nations M.D.   On: 01/13/2016 12:39   Dg Abd Portable 1v-small Bowel Obstruction Protocol-initial, 8 Hr Delay  01/14/2016  CLINICAL DATA:  Small bowel obstruction. Oral contrast at 90.m. last night. Nausea. EXAM: PORTABLE ABDOMEN - 1 VIEW COMPARISON:  01/13/2016 FINDINGS: The contrast is primarily confined to the stomach and to dilated loops of proximal small bowel. These loops are poorly defined but measure up to 5.5 cm in measurable diameter. There also some gas filled loops of dilated small bowel measuring up to 6.8 cm. The colon contains a tiny amount of gas and does not appear dilated. IMPRESSION: 1. Dilated loops of small bowel with poor progression of contrast medium through the small bowel with compatible with the known small bowel obstruction. Electronically Signed   By: Van Clines M.D.   On: 01/14/2016 08:40   Dg Abd Portable 1v  01/12/2016  CLINICAL DATA:  Verify placement of nasogastric tube P EXAM: PORTABLE ABDOMEN - 1 VIEW COMPARISON:  CT abdomen from earlier same day. FINDINGS: Nasogastric tube is in the stomach. Stomach remains distended with air. Visualized small bowel remains distended. No evidence of free intraperitoneal air. IMPRESSION: Nasogastric tube in the stomach. Proximal side holes are approximately 7 cm below the level of the GE junction. Electronically Signed   By: Franki Cabot M.D.   On: 01/12/2016 14:25    Medications / Allergies:  Scheduled Meds: . azithromycin  500 mg Intravenous Q24H  . cefTRIAXone (ROCEPHIN)  IV  1 g Intravenous Q24H  . clorazepate  15 mg Oral QHS  . ezetimibe  10 mg Oral Daily  . insulin aspart  0-9 Units Subcutaneous TID WC  .  levothyroxine  12.5 mcg Intravenous Daily  . metoprolol  5 mg Intravenous Q6H  . montelukast  10 mg Oral QHS  . pantoprazole (PROTONIX) IV  40 mg Intravenous Q24H  . QUEtiapine  50 mg Oral Q breakfast  . rosuvastatin  20 mg Oral QHS  . sodium chloride flush  3 mL Intravenous Q12H   Continuous Infusions: . dextrose 75 mL/hr at 01/14/16 0947   PRN Meds:.acetaminophen **OR** acetaminophen, bisacodyl, HYDROcodone-acetaminophen, ipratropium-albuterol, LORazepam, ondansetron **OR** ondansetron (ZOFRAN) IV, phenol, promethazine, senna-docusate, simethicone, traMADol  Antibiotics: Anti-infectives    Start     Dose/Rate Route Frequency Ordered Stop   01/12/16  0900  cefTRIAXone (ROCEPHIN) 1 g in dextrose 5 % 50 mL IVPB     1 g 100 mL/hr over 30 Minutes Intravenous Every 24 hours 01/11/16 1251     01/12/16 0900  azithromycin (ZITHROMAX) 500 mg in dextrose 5 % 250 mL IVPB     500 mg 250 mL/hr over 60 Minutes Intravenous Every 24 hours 01/11/16 1251     01/11/16 0830  azithromycin (ZITHROMAX) 500 mg in dextrose 5 % 250 mL IVPB     500 mg 250 mL/hr over 60 Minutes Intravenous  Once 01/11/16 0820 01/11/16 0935   01/11/16 0830  cefTRIAXone (ROCEPHIN) 1 g in dextrose 5 % 50 mL IVPB     1 g 100 mL/hr over 30 Minutes Intravenous  Once 01/11/16 0820 01/11/16 0947       Assessment/Plan SBO-likely secondary to adhesions. no progression of contrast on 8 hour film.  Will obtain a 24h film tonight.  Doubt she will resolve without an operation.  I spoke with her son and granddaughter.  They are considering surgery versus comfort care if sbo does not resolve. We will re-evaluate things tomorrow.   ID-RLL PNA Hx CAD, HTN, HLD-followed by Dr. Claiborne Billings, would need cardiac clearance prior to surgery if she doesn't resolve. VTE prophylaxis-scd, would consider lovenox/heparin   Erby Pian, ANP-BC [facility]   01/14/2016 10:40 AM

## 2016-01-15 DIAGNOSIS — Z794 Long term (current) use of insulin: Secondary | ICD-10-CM

## 2016-01-15 LAB — BASIC METABOLIC PANEL
ANION GAP: 6 (ref 5–15)
BUN: 22 mg/dL — AB (ref 6–20)
CO2: 24 mmol/L (ref 22–32)
Calcium: 8.3 mg/dL — ABNORMAL LOW (ref 8.9–10.3)
Chloride: 114 mmol/L — ABNORMAL HIGH (ref 101–111)
Creatinine, Ser: 0.74 mg/dL (ref 0.44–1.00)
GFR calc Af Amer: >60 mL/min (ref >60)
Glucose, Bld: 159 mg/dL — ABNORMAL HIGH (ref 65–99)
POTASSIUM: 3.2 mmol/L — AB (ref 3.5–5.1)
SODIUM: 144 mmol/L (ref 135–145)

## 2016-01-15 LAB — GLUCOSE, CAPILLARY
GLUCOSE-CAPILLARY: 147 mg/dL — AB (ref 65–99)
GLUCOSE-CAPILLARY: 116 mg/dL — AB (ref 65–99)
GLUCOSE-CAPILLARY: 152 mg/dL — AB (ref 65–99)
GLUCOSE-CAPILLARY: 149 mg/dL — AB (ref 65–99)
Glucose-Capillary: 128 mg/dL — ABNORMAL HIGH (ref 65–99)
Glucose-Capillary: 155 mg/dL — ABNORMAL HIGH (ref 65–99)

## 2016-01-15 MED ORDER — POTASSIUM CHLORIDE CRYS ER 20 MEQ PO TBCR: 20.0000 meq | EXTENDED_RELEASE_TABLET | Freq: Once | ORAL | Status: DC

## 2016-01-15 MED ORDER — POTASSIUM CHLORIDE 20 MEQ/15ML (10%) PO SOLN
20.0000 meq | Freq: Once | ORAL | Status: AC
  Administered 2016-01-15: 20 meq via ORAL
  Filled 2016-01-15: qty 15

## 2016-01-15 MED ORDER — POTASSIUM CHLORIDE 10 MEQ/100ML IV SOLN
10.0000 meq | INTRAVENOUS | Status: AC
  Administered 2016-01-15 (×2): 10 meq via INTRAVENOUS
  Filled 2016-01-15 (×2): qty 100

## 2016-01-15 MED ORDER — POTASSIUM CHLORIDE 20 MEQ PO PACK: 20.0000 meq | PACK | Freq: Once | ORAL | Status: DC

## 2016-01-15 NOTE — Progress Notes (Signed)
Subjective: Feels better. Once staff got her up, she states she started to have some BMs. Had a BM this am. Less bloated too. Still with some pain at times  Objective: Vital signs in last 24 hours: Temp:  [98.2 F (36.8 C)-98.9 F (37.2 C)] 98.6 F (37 C) 2023/02/07 0739) Pulse Rate:  [82-110] 91 2023-02-07 0739) Resp:  [13-22] 13 02/07/23 0500) BP: (106-135)/(40-87) 126/58 mmHg February 07, 2023 0739) SpO2:  [97 %-100 %] 100 % February 07, 2023 0739) Weight:  [86.229 kg (190 lb 1.6 oz)] 86.229 kg (190 lb 1.6 oz) February 07, 2023 0500) Last BM Date: 01/14/16  Intake/Output from previous day: 05/27 0701 - 2023-02-07 0700 In: 1641.3 [I.V.:1441.3; IV Piggyback:200] Out: 1400 [Urine:950; Emesis/NG output:450] Intake/Output this shift:    Alert, nad Soft, mild distension, scattered BS, nt  Lab Results:   Recent Labs  01/12/16 1819 01/13/16 0719  WBC 9.2 6.2  HGB 10.6* 9.7*  HCT 34.6* 31.2*  PLT 312 271   BMET  Recent Labs  01/14/16 0604 02/07/16 0355  NA 147* 144  K 3.5 3.2*  CL 114* 114*  CO2 23 24  GLUCOSE 142* 159*  BUN 36* 22*  CREATININE 0.87 0.74  CALCIUM 8.6* 8.3*   PT/INR No results for input(s): LABPROT, INR in the last 72 hours. ABG No results for input(s): PHART, HCO3 in the last 72 hours.  Invalid input(s): PCO2, PO2  Studies/Results: Dg Abd 2 Views  2016-02-07  CLINICAL DATA:  24 hour delay radiograph for small bowel obstruction. EXAM: ABDOMEN - 2 VIEW COMPARISON:  Abdominal radiograph performed earlier today at 5:13 a.m. FINDINGS: Contrast has progressed to the colon. Previously noted dilated small bowel loops are less apparent, suggesting only partial small bowel obstruction at this time. The stomach is partially filled with air and is grossly unremarkable in appearance. An enteric tube is seen ending overlying the body of the stomach. No free intra-abdominal air is seen on the provided decubitus view. No acute osseous abnormalities are identified. The visualized lung bases are grossly  clear. IMPRESSION: Contrast has progressed to the colon. Previously noted dilated small bowel loops are less apparent, suggesting only partial small-bowel obstruction at this time. No free intra-abdominal air seen. Electronically Signed   By: Garald Balding M.D.   On: 02-07-16 01:44   Dg Abd 2 Views  01/13/2016  CLINICAL DATA:  SBO; abd distension EXAM: ABDOMEN - 2 VIEW COMPARISON:  01/12/2016 FINDINGS: Nasogastric tube is in place, tip overlying the level of the stomach. Gas is identified within numerous markedly dilated small bowel loops. Gas is identified within nondilated loops of colon and rectum, consistent with high-grade small bowel obstruction. IMPRESSION: Persistent significant small bowel obstruction. Electronically Signed   By: Nolon Nations M.D.   On: 01/13/2016 12:39   Dg Abd Portable 1v-small Bowel Obstruction Protocol-initial, 8 Hr Delay  01/14/2016  CLINICAL DATA:  Small bowel obstruction. Oral contrast at 90.m. last night. Nausea. EXAM: PORTABLE ABDOMEN - 1 VIEW COMPARISON:  01/13/2016 FINDINGS: The contrast is primarily confined to the stomach and to dilated loops of proximal small bowel. These loops are poorly defined but measure up to 5.5 cm in measurable diameter. There also some gas filled loops of dilated small bowel measuring up to 6.8 cm. The colon contains a tiny amount of gas and does not appear dilated. IMPRESSION: 1. Dilated loops of small bowel with poor progression of contrast medium through the small bowel with compatible with the known small bowel obstruction. Electronically Signed   By: Thayer Jew  Janeece Fitting M.D.   On: 01/14/2016 08:40    Anti-infectives: Anti-infectives    Start     Dose/Rate Route Frequency Ordered Stop   01/12/16 0900  cefTRIAXone (ROCEPHIN) 1 g in dextrose 5 % 50 mL IVPB     1 g 100 mL/hr over 30 Minutes Intravenous Every 24 hours 01/11/16 1251     01/12/16 0900  azithromycin (ZITHROMAX) 500 mg in dextrose 5 % 250 mL IVPB     500 mg 250 mL/hr  over 60 Minutes Intravenous Every 24 hours 01/11/16 1251     01/11/16 0830  azithromycin (ZITHROMAX) 500 mg in dextrose 5 % 250 mL IVPB     500 mg 250 mL/hr over 60 Minutes Intravenous  Once 01/11/16 0820 01/11/16 0935   01/11/16 0830  cefTRIAXone (ROCEPHIN) 1 g in dextrose 5 % 50 mL IVPB     1 g 100 mL/hr over 30 Minutes Intravenous  Once 01/11/16 0820 01/11/16 0947      Assessment/Plan: SBO-likely secondary to adhesions.ctx now in colon, having BMs. NG 450/24hrs. Will clamp NG this am and she how she does, can have sips of liquids from floor (juice, coffee, tea, italian icee) ID-RLL PNA Hx CAD, HTN, HLD-followed by Dr. Claiborne Billings, would need cardiac clearance prior to surgery if she doesn't resolve. VTE prophylaxis-scd, would consider lovenox/heparin  Leighton Ruff. Redmond Pulling, MD, FACS General, Bariatric, & Minimally Invasive Surgery [facility], [place]   LOS: 4 days    Gayland Curry 01/15/2016

## 2016-01-15 NOTE — Progress Notes (Signed)
Pt tolerating liquids well.  No nausea, no abdominal pain.  She has had half of a Ginger ale, one [place] Icee, and one cranberry juice.     Pt has had 2 loose BM's today.  Dr. Redmond Pulling would like to continue only clear liquids for the rest of the evening today.  Will re assess in the morning.    Pt agrees with plan.

## 2016-01-15 NOTE — Progress Notes (Signed)
PROGRESS NOTE    [name]  [id number] DOB: 07-27-1925 DOA: 01/11/2016 PCP: [name], MD    Brief Narrative: [name] is a 82 y.o. female with a Past Medical History of asthma, CAD, DM, hypogonadism, GERD, CAD, who presents with sepsis from CAP and possible UTI. She was also found to have SBO from adhesions.   Assessment & Plan:   Principal Problem:   Sepsis due to pneumonia [facility]) Active Problems:   Diabetes mellitus type 2, controlled ([place])   Coronary atherosclerosis   Asthma with bronchitis   HTN (hypertension)   HLD (hyperlipidemia)   Hypothyroid   GERD (gastroesophageal reflux disease)   Diabetes mellitus type 2 without retinopathy ([place])   Sepsis ([place])   Acute on chronic respiratory failure ([facility])   SBO (small bowel obstruction) ([facility])   Hypokalemia   Sepsis from the  Right lower lobe pneumonia; Admitted to SDU, and currently on IV antibiotics.  [place] oxygen to keep sats greater than 90%. Normal lactic acid and minimal procalcitonin.  Wbc count normalized.  Repeat CXR in am   Abdominal distention, nausea, vomiting ; CT shows evidence of high grade SBO. Surgery consutled and placed NG tube with intermittent suction.  Repeat ABD film ordered showed persistent sbo SBO protocol, she is passing flatulence, having Bm's . NG tube clamped and is on clears now.  Out of bed.   Hypernatremia: changed fluids to dextrose fluids. Resolved.     Diabetes mellitus: Optimal control.  hgba1c is around 7.  CBG (last 3)   Recent Labs  01/15/16 0724 01/15/16 1118 01/15/16 1619  GLUCAP 147* 116* 152*    Resume SSI.    Hypertension; Controlled.    Hypothyroidism: Resume synthroid.   Hypokalemia: Replete as needed and repeat in am.   Acute urinary retention: Foley to be placed.    DVT prophylaxis: /SCD's/ Code Status: (Full) Family Communication: son at bedside Disposition Plan: pending further management. Possibly to SNF when stable.     Consultants:   surgery   Procedures:   CT abd and pelvis.    Antimicrobials:   Rocephin 5/24  zithromax 5/24   Subjective: Nausea improved, no vomiting today, passing flatulence. Having BM's  Objective: Filed Vitals:   01/15/16 0739 01/15/16 1126 01/15/16 1332 01/15/16 1634  BP: 126/58 125/65 145/59 138/62  Pulse: 91 89 93 88  Temp: 98.6 F (37 C) 98.4 F (36.9 C)  97.9 F (36.6 C)  TempSrc:  Oral  Oral  Resp: 15 13 18 15   Height:      Weight:      SpO2: 100% 100% 100% 99%    Intake/Output Summary (Last 24 hours) at 01/15/16 1716 Last data filed at 01/15/16 0600  Gross per 24 hour  Intake 1441.25 ml  Output   1000 ml  Net 441.25 ml   Filed Weights   01/13/16 0600 01/14/16 0403 01/15/16 0500  Weight: 85.458 kg (188 lb 6.4 oz) 86.955 kg (191 lb 11.2 oz) 86.229 kg (190 lb 1.6 oz)    Examination:  General exam: Appears UNCOMFORTABLE.   Respiratory system: diminished at bases, no wheezing or rhonchi. Respiratory effort normal. Cardiovascular system: S1 & S2 heard, RRR. No JVD, murmurs, rubs, gallops or clicks. No pedal edema. Gastrointestinal system: Abdomen distended, soft mildly generalized tenderness, . No organomegaly or masses felt. Decreased bowel sounds.  Central nervous system: Alert and oriented. No new focal neurological deficits. Extremities: Symmetric 5 x 5 power. Skin: No rashes, lesions or ulcers Psychiatry:  Judgement and insight appear normal. Mood & affect appropriate.     Data Reviewed: I have personally reviewed following labs and imaging studies  CBC:  Recent Labs Lab 01/11/16 0727 01/11/16 0742 01/11/16 1225 01/12/16 0248 01/12/16 1819 01/13/16 0719  WBC 13.8*  --  10.7* 8.8 9.2 6.2  NEUTROABS 10.3*  --  7.3  --   --   --   HGB 10.9* 12.2 10.4* 10.2* 10.6* 9.7*  HCT 35.2* 36.0 32.9* 32.8* 34.6* 31.2*  MCV 80.7  --  79.3 79.2 80.8 81.0  PLT 292  --  256 295 312 [id number]   Basic Metabolic Panel:  Recent Labs Lab  01/12/16 0248 01/12/16 0942 01/12/16 1819 01/13/16 0359 01/14/16 0604 01/15/16 0355  NA 140  --  138 141 147* 144  K 2.7* 2.8* 4.0 3.8 3.5 3.2*  CL 107  --  106 110 114* 114*  CO2 22  --  23 24 23 24   GLUCOSE 177*  --  173* 165* 142* 159*  BUN 74*  --  64* 59* 36* 22*  CREATININE 1.49*  --  1.41* 1.21* 0.87 0.74  CALCIUM 8.3*  --  8.4* 8.2* 8.6* 8.3*  MG 2.3  --   --   --   --   --    GFR: Estimated Creatinine Clearance: 48.5 mL/min (by C-G formula based on Cr of 0.74). Liver Function Tests:  Recent Labs Lab 01/11/16 0727 01/11/16 1225 01/12/16 0248  AST 20 19 27   ALT 17 16 27   ALKPHOS 43 42 41  BILITOT 0.5 0.4 0.5  PROT 6.2* 5.7* 5.8*  ALBUMIN 3.0* 2.7* 2.6*   No results for input(s): LIPASE, AMYLASE in the last 168 hours. No results for input(s): AMMONIA in the last 168 hours. Coagulation Profile:  Recent Labs Lab 01/11/16 1225 01/12/16 0248  INR 1.34 1.39   Cardiac Enzymes:  Recent Labs Lab 01/12/16 0248 01/12/16 1103 01/12/16 1544  TROPONINI 0.06* 0.05* 0.05*   BNP (last 3 results) No results for input(s): PROBNP in the last 8760 hours. HbA1C: No results for input(s): HGBA1C in the last 72 hours. CBG:  Recent Labs Lab 01/15/16 0016 01/15/16 0414 01/15/16 0724 01/15/16 1118 01/15/16 1619  GLUCAP 128* 155* 147* 116* 152*   Lipid Profile: No results for input(s): CHOL, HDL, LDLCALC, TRIG, CHOLHDL, LDLDIRECT in the last 72 hours. Thyroid Function Tests: No results for input(s): TSH, T4TOTAL, FREET4, T3FREE, THYROIDAB in the last 72 hours. Anemia Panel: No results for input(s): VITAMINB12, FOLATE, FERRITIN, TIBC, IRON, RETICCTPCT in the last 72 hours. Sepsis Labs:  Recent Labs Lab 01/11/16 0742 01/11/16 1100 01/11/16 1225 01/11/16 1533  PROCALCITON  --   --  0.26  --   LATICACIDVEN 1.90 1.08 1.5 1.2    Recent Results (from the past 240 hour(s))  Blood Culture (routine x 2)     Status: None (Preliminary result)   Collection Time:  01/11/16  7:57 AM  Result Value Ref Range Status   Specimen Description BLOOD LEFT ANTECUBITAL  Final   Special Requests IN PEDIATRIC BOTTLE 2CC  Final   Culture NO GROWTH 4 DAYS  Final   Report Status PENDING  Incomplete  Blood Culture (routine x 2)     Status: None (Preliminary result)   Collection Time: 01/11/16  7:58 AM  Result Value Ref Range Status   Specimen Description BLOOD LEFT HAND  Final   Special Requests BOTTLES DRAWN AEROBIC ONLY 5CC  Final   Culture NO GROWTH 4 DAYS  Final   Report Status PENDING  Incomplete  Urine culture     Status: None   Collection Time: 01/11/16  9:57 AM  Result Value Ref Range Status   Specimen Description URINE, CATHETERIZED  Final   Special Requests NONE  Final   Culture NO GROWTH  Final   Report Status 01/12/2016 FINAL  Final  MRSA PCR Screening     Status: None   Collection Time: 01/11/16  6:00 PM  Result Value Ref Range Status   MRSA by PCR NEGATIVE NEGATIVE Final    Comment:        The GeneXpert MRSA Assay (FDA approved for NASAL specimens only), is one component of a comprehensive MRSA colonization surveillance program. It is not intended to diagnose MRSA infection nor to guide or monitor treatment for MRSA infections.          Radiology Studies: Dg Abd 2 Views  01/15/2016  CLINICAL DATA:  24 hour delay radiograph for small bowel obstruction. EXAM: ABDOMEN - 2 VIEW COMPARISON:  Abdominal radiograph performed earlier today at 5:13 a.m. FINDINGS: Contrast has progressed to the colon. Previously noted dilated small bowel loops are less apparent, suggesting only partial small bowel obstruction at this time. The stomach is partially filled with air and is grossly unremarkable in appearance. An enteric tube is seen ending overlying the body of the stomach. No free intra-abdominal air is seen on the provided decubitus view. No acute osseous abnormalities are identified. The visualized lung bases are grossly clear. IMPRESSION: Contrast  has progressed to the colon. Previously noted dilated small bowel loops are less apparent, suggesting only partial small-bowel obstruction at this time. No free intra-abdominal air seen. Electronically Signed   By: Garald Balding M.D.   On: 01/15/2016 01:44   Dg Abd Portable 1v-small Bowel Obstruction Protocol-initial, 8 Hr Delay  01/14/2016  CLINICAL DATA:  Small bowel obstruction. Oral contrast at 90.m. last night. Nausea. EXAM: PORTABLE ABDOMEN - 1 VIEW COMPARISON:  01/13/2016 FINDINGS: The contrast is primarily confined to the stomach and to dilated loops of proximal small bowel. These loops are poorly defined but measure up to 5.5 cm in measurable diameter. There also some gas filled loops of dilated small bowel measuring up to 6.8 cm. The colon contains a tiny amount of gas and does not appear dilated. IMPRESSION: 1. Dilated loops of small bowel with poor progression of contrast medium through the small bowel with compatible with the known small bowel obstruction. Electronically Signed   By: Van Clines M.D.   On: 01/14/2016 08:40        Scheduled Meds: . azithromycin  500 mg Intravenous Q24H  . cefTRIAXone (ROCEPHIN)  IV  1 g Intravenous Q24H  . clorazepate  15 mg Oral QHS  . ezetimibe  10 mg Oral Daily  . insulin aspart  0-9 Units Subcutaneous TID WC  . levothyroxine  12.5 mcg Intravenous Daily  . metoprolol  5 mg Intravenous Q6H  . montelukast  10 mg Oral QHS  . pantoprazole (PROTONIX) IV  40 mg Intravenous Q24H  . potassium chloride  20 mEq Oral Once  . QUEtiapine  50 mg Oral Q breakfast  . rosuvastatin  20 mg Oral QHS  . sodium chloride flush  3 mL Intravenous Q12H   Continuous Infusions: . dextrose 75 mL/hr at 01/15/16 0146     LOS: 4 days    Time spent: [place], MD [facility] Pager [phone]  If 7PM-7AM,  please contact night-coverage [email or web address] Password [facility] 01/15/2016, 5:16 PM

## 2016-01-15 NOTE — Progress Notes (Signed)
Upon assessment foley was not draining. Bladder felt distended and bladder scanner showed 550 ml in bladder. Paged on call and received order to flush. Drained 600+ ml after flushed.

## 2016-01-16 ENCOUNTER — Inpatient Hospital Stay (HOSPITAL_COMMUNITY): Payer: Medicare Other

## 2016-01-16 LAB — CBC
HEMATOCRIT: 30 % — AB (ref 36.0–46.0)
HEMOGLOBIN: 9 g/dL — AB (ref 12.0–15.0)
MCH: 24.5 pg — AB (ref 26.0–34.0)
MCHC: 30 g/dL (ref 30.0–36.0)
MCV: 81.7 fL (ref 78.0–100.0)
Platelets: 273 10*3/uL (ref 150–400)
RBC: 3.67 MIL/uL — ABNORMAL LOW (ref 3.87–5.11)
RDW: 14.7 % (ref 11.5–15.5)
WBC: 8.2 10*3/uL (ref 4.0–10.5)

## 2016-01-16 LAB — GLUCOSE, CAPILLARY
GLUCOSE-CAPILLARY: 155 mg/dL — AB (ref 65–99)
GLUCOSE-CAPILLARY: 112 mg/dL — AB (ref 65–99)
Glucose-Capillary: 131 mg/dL — ABNORMAL HIGH (ref 65–99)
Glucose-Capillary: 152 mg/dL — ABNORMAL HIGH (ref 65–99)
Glucose-Capillary: 161 mg/dL — ABNORMAL HIGH (ref 65–99)
Glucose-Capillary: 127 mg/dL — ABNORMAL HIGH (ref 65–99)

## 2016-01-16 LAB — CULTURE, BLOOD (ROUTINE X 2)
CULTURE: NO GROWTH
Culture: NO GROWTH

## 2016-01-16 LAB — BASIC METABOLIC PANEL
Anion gap: 6 (ref 5–15)
BUN: 10 mg/dL (ref 6–20)
CALCIUM: 8.4 mg/dL — AB (ref 8.9–10.3)
CHLORIDE: 111 mmol/L (ref 101–111)
CO2: 23 mmol/L (ref 22–32)
CREATININE: 0.64 mg/dL (ref 0.44–1.00)
GFR calc Af Amer: >60 mL/min (ref >60)
GFR calc non Af Amer: >60 mL/min (ref >60)
GLUCOSE: 165 mg/dL — AB (ref 65–99)
Potassium: 3.5 mmol/L (ref 3.5–5.1)
Sodium: 140 mmol/L (ref 135–145)

## 2016-01-16 MED ORDER — PANTOPRAZOLE SODIUM 40 MG PO TBEC
40.0000 mg | DELAYED_RELEASE_TABLET | Freq: Every day | ORAL | Status: DC
  Administered 2016-01-17 – 2016-01-19 (×3): 40 mg via ORAL
  Filled 2016-01-16 (×3): qty 1

## 2016-01-16 MED ORDER — LEVOTHYROXINE SODIUM 25 MCG PO TABS
25.0000 ug | ORAL_TABLET | Freq: Every day | ORAL | Status: DC
  Administered 2016-01-17 – 2016-01-19 (×3): 25 ug via ORAL
  Filled 2016-01-16 (×4): qty 1

## 2016-01-16 NOTE — Progress Notes (Signed)
PROGRESS NOTE    [name]  [id number] DOB: 1925/07/21 DOA: 01/11/2016 PCP: [name], MD    Brief Narrative: [name] is a 82 y.o. female with a Past Medical History of asthma, CAD, DM, hypogonadism, GERD, CAD, who presents with sepsis from CAP and possible UTI. She was also found to have SBO from adhesions.   Assessment & Plan:   Principal Problem:   Sepsis due to pneumonia [facility]) Active Problems:   Diabetes mellitus type 2, controlled ([place])   Coronary atherosclerosis   Asthma with bronchitis   HTN (hypertension)   HLD (hyperlipidemia)   Hypothyroid   GERD (gastroesophageal reflux disease)   Diabetes mellitus type 2 without retinopathy ([place])   Sepsis ([place])   Acute on chronic respiratory failure ([facility])   SBO (small bowel obstruction) ([facility])   Hypokalemia   Sepsis from the  Right lower lobe pneumonia; Admitted to SDU, and currently on IV antibiotics.  [place] oxygen to keep sats greater than 90%. Normal lactic acid and minimal procalcitonin.  Wbc count normalized.  Repeat CXR in am shows improvement.  Plan to d/c antibiotics in 1 to 2 days.   Abdominal distention, nausea, vomiting ; CT shows evidence of high grade SBO. Surgery consutled and placed NG tube with intermittent suction.  Repeat ABD film ordered showed persistent sbo on 5/27 SBO protocol, she is passing flatulence, having Bm's . NG tube clamped and is on clears on 5/28. NG TUBE taken out on 5/29 as she is tolerating clears without nausea, vomiting and abdominal pain. She is having BM's .  Plan to take NG tube out and advance diet as tolerated.  Out of bed.   Hypernatremia: changed fluids to dextrose fluids. Resolved.  D/c fluids.       Diabetes mellitus: Optimal control.  hgba1c is around 7.  CBG (last 3)   Recent Labs  01/15/16 2355 01/16/16 0341 01/16/16 0719  GLUCAP 131* 152* 161*    Resume SSI.    Hypertension; Controlled.    Hypothyroidism: Resume synthroid.    Hypokalemia: Replete as needed and repeat in am.   Acute urinary retention: Foley to be placed.    DVT prophylaxis: /SCD's/ Code Status: (Full) Family Communication: son at bedside Disposition Plan: pending further management. Possibly to SNF when able to tolerate a regular diet.     Consultants:   surgery   Procedures:   CT abd and pelvis.    Antimicrobials:   Rocephin 5/24  zithromax 5/24   Subjective: Nausea improved, no vomiting today, passing flatulence. Having BM's Weaned off oxygen.   Objective: Filed Vitals:   01/15/16 2352 01/16/16 0110 01/16/16 0338 01/16/16 0734  BP: 145/46 149/54 135/55 130/62  Pulse: 94 96 90 89  Temp: 98.6 F (37 C)  98.1 F (36.7 C) 98.3 F (36.8 C)  TempSrc: Oral  Oral Oral  Resp: 16  22 19   Height:      Weight:   83.915 kg (185 lb)   SpO2: 98%  97% 98%    Intake/Output Summary (Last 24 hours) at 01/16/16 1020 Last data filed at 01/16/16 0800  Gross per 24 hour  Intake   1920 ml  Output   1575 ml  Net    345 ml   Filed Weights   01/14/16 0403 01/15/16 0500 01/16/16 0338  Weight: 86.955 kg (191 lb 11.2 oz) 86.229 kg (190 lb 1.6 oz) 83.915 kg (185 lb)    Examination:  General exam: Appears UNCOMFORTABLE.   Respiratory  system: diminished at bases, no wheezing or rhonchi. Respiratory effort normal. Cardiovascular system: S1 & S2 heard, RRR. No JVD, murmurs, rubs, gallops or clicks. No pedal edema. Gastrointestinal system: Abdomen distended, soft mildly generalized tenderness, . No organomegaly or masses felt. Decreased bowel sounds.  Central nervous system: Alert and oriented. No new focal neurological deficits. Extremities: Symmetric 5 x 5 power. Skin: No rashes, lesions or ulcers Psychiatry: Judgement and insight appear normal. Mood & affect appropriate.     Data Reviewed: I have personally reviewed following labs and imaging studies  CBC:  Recent Labs Lab 01/11/16 0727  01/11/16 1225 01/12/16 0248  01/12/16 1819 01/13/16 0719 01/16/16 0322  WBC 13.8*  --  10.7* 8.8 9.2 6.2 8.2  NEUTROABS 10.3*  --  7.3  --   --   --   --   HGB 10.9*  < > 10.4* 10.2* 10.6* 9.7* 9.0*  HCT 35.2*  < > 32.9* 32.8* 34.6* 31.2* 30.0*  MCV 80.7  --  79.3 79.2 80.8 81.0 81.7  PLT 292  --  256 295 312 271 273  < > = values in this interval not displayed. Basic Metabolic Panel:  Recent Labs Lab 01/12/16 0248  01/12/16 1819 01/13/16 0359 01/14/16 0604 01/15/16 0355 01/16/16 0322  NA 140  --  138 141 147* 144 140  K 2.7*  < > 4.0 3.8 3.5 3.2* 3.5  CL 107  --  106 110 114* 114* 111  CO2 22  --  23 24 23 24 23   GLUCOSE 177*  --  173* 165* 142* 159* 165*  BUN 74*  --  64* 59* 36* 22* 10  CREATININE 1.49*  --  1.41* 1.21* 0.87 0.74 0.64  CALCIUM 8.3*  --  8.4* 8.2* 8.6* 8.3* 8.4*  MG 2.3  --   --   --   --   --   --   < > = values in this interval not displayed. GFR: Estimated Creatinine Clearance: 47.7 mL/min (by C-G formula based on Cr of 0.64). Liver Function Tests:  Recent Labs Lab 01/11/16 0727 01/11/16 1225 01/12/16 0248  AST 20 19 27   ALT 17 16 27   ALKPHOS 43 42 41  BILITOT 0.5 0.4 0.5  PROT 6.2* 5.7* 5.8*  ALBUMIN 3.0* 2.7* 2.6*   No results for input(s): LIPASE, AMYLASE in the last 168 hours. No results for input(s): AMMONIA in the last 168 hours. Coagulation Profile:  Recent Labs Lab 01/11/16 1225 01/12/16 0248  INR 1.34 1.39   Cardiac Enzymes:  Recent Labs Lab 01/12/16 0248 01/12/16 1103 01/12/16 1544  TROPONINI 0.06* 0.05* 0.05*   BNP (last 3 results) No results for input(s): PROBNP in the last 8760 hours. HbA1C: No results for input(s): HGBA1C in the last 72 hours. CBG:  Recent Labs Lab 01/15/16 1619 01/15/16 2009 01/15/16 2355 01/16/16 0341 01/16/16 0719  GLUCAP 152* 149* 131* 152* 161*   Lipid Profile: No results for input(s): CHOL, HDL, LDLCALC, TRIG, CHOLHDL, LDLDIRECT in the last 72 hours. Thyroid Function Tests: No results for input(s): TSH,  T4TOTAL, FREET4, T3FREE, THYROIDAB in the last 72 hours. Anemia Panel: No results for input(s): VITAMINB12, FOLATE, FERRITIN, TIBC, IRON, RETICCTPCT in the last 72 hours. Sepsis Labs:  Recent Labs Lab 01/11/16 0742 01/11/16 1100 01/11/16 1225 01/11/16 1533  PROCALCITON  --   --  0.26  --   LATICACIDVEN 1.90 1.08 1.5 1.2    Recent Results (from the past 240 hour(s))  Blood Culture (routine x  2)     Status: None (Preliminary result)   Collection Time: 01/11/16  7:57 AM  Result Value Ref Range Status   Specimen Description BLOOD LEFT ANTECUBITAL  Final   Special Requests IN PEDIATRIC BOTTLE 2CC  Final   Culture NO GROWTH 4 DAYS  Final   Report Status PENDING  Incomplete  Blood Culture (routine x 2)     Status: None (Preliminary result)   Collection Time: 01/11/16  7:58 AM  Result Value Ref Range Status   Specimen Description BLOOD LEFT HAND  Final   Special Requests BOTTLES DRAWN AEROBIC ONLY 5CC  Final   Culture NO GROWTH 4 DAYS  Final   Report Status PENDING  Incomplete  Urine culture     Status: None   Collection Time: 01/11/16  9:57 AM  Result Value Ref Range Status   Specimen Description URINE, CATHETERIZED  Final   Special Requests NONE  Final   Culture NO GROWTH  Final   Report Status 01/12/2016 FINAL  Final  MRSA PCR Screening     Status: None   Collection Time: 01/11/16  6:00 PM  Result Value Ref Range Status   MRSA by PCR NEGATIVE NEGATIVE Final    Comment:        The GeneXpert MRSA Assay (FDA approved for NASAL specimens only), is one component of a comprehensive MRSA colonization surveillance program. It is not intended to diagnose MRSA infection nor to guide or monitor treatment for MRSA infections.          Radiology Studies: Dg Chest 2 View  01/16/2016  CLINICAL DATA:  Pneumonia EXAM: CHEST  2 VIEW COMPARISON:  01/12/2016 FINDINGS: Cardiac shadow is within normal limits. The lungs are well aerated bilaterally. Small bilateral pleural effusions  are seen. No acute bony abnormality is noted. IMPRESSION: No active cardiopulmonary disease. Electronically Signed   By: Inez Catalina M.D.   On: 01/16/2016 07:38   Dg Abd 2 Views  01/15/2016  CLINICAL DATA:  24 hour delay radiograph for small bowel obstruction. EXAM: ABDOMEN - 2 VIEW COMPARISON:  Abdominal radiograph performed earlier today at 5:13 a.m. FINDINGS: Contrast has progressed to the colon. Previously noted dilated small bowel loops are less apparent, suggesting only partial small bowel obstruction at this time. The stomach is partially filled with air and is grossly unremarkable in appearance. An enteric tube is seen ending overlying the body of the stomach. No free intra-abdominal air is seen on the provided decubitus view. No acute osseous abnormalities are identified. The visualized lung bases are grossly clear. IMPRESSION: Contrast has progressed to the colon. Previously noted dilated small bowel loops are less apparent, suggesting only partial small-bowel obstruction at this time. No free intra-abdominal air seen. Electronically Signed   By: Garald Balding M.D.   On: 01/15/2016 01:44        Scheduled Meds: . azithromycin  500 mg Intravenous Q24H  . cefTRIAXone (ROCEPHIN)  IV  1 g Intravenous Q24H  . clorazepate  15 mg Oral QHS  . ezetimibe  10 mg Oral Daily  . insulin aspart  0-9 Units Subcutaneous TID WC  . levothyroxine  12.5 mcg Intravenous Daily  . metoprolol  5 mg Intravenous Q6H  . montelukast  10 mg Oral QHS  . pantoprazole (PROTONIX) IV  40 mg Intravenous Q24H  . QUEtiapine  50 mg Oral Q breakfast  . rosuvastatin  20 mg Oral QHS  . sodium chloride flush  3 mL Intravenous Q12H   Continuous Infusions:  LOS: 5 days    Time spent: [place], MD [facility] Pager [phone]  If 7PM-7AM, please contact night-coverage [email or web address] Password [email or web address] 01/16/2016, 10:20 AM

## 2016-01-16 NOTE — Progress Notes (Signed)
Physical Therapy Treatment Patient Details Name: [name] MRN: [id number] DOB: [birth year] Today's Date: 01/16/2016    History of Present Illness 82 y.o. female adm with 3-4d of SOB, also with abd distension, found to have SBO d/y adhesion;    PMHx: Asthma not on home oxygen, CAD, DM2, hypothyroidism, GERD, CAD, OA, R TKA    PT Comments    Noting improving activity tolerance today; Provided education re: the benefits of being OOB and having a  Repertoire of a variety of positions; especially with pneumonia and clearing SBO; Wil lcontinue to work on mobility, and continue to recommend SNF for post-acute rehab to maximize independence and safety with mobility prior to dc home;   Noted some confusion with PT eval -- I believe it was a smartphrase error in stating PT would sign off -- [name] is making progress and is very appropriate for continuing PT acutely and at SNF  Follow Up Recommendations  SNF     Equipment Recommendations  None recommended by PT    Recommendations for Other Services       Precautions / Restrictions Precautions Precautions: Fall;Other (comment) Restrictions Weight Bearing Restrictions: No    Mobility  Bed Mobility Overal bed mobility: Needs Assistance Bed Mobility: Rolling;Sidelying to Sit Rolling: Mod assist Sidelying to sit: Mod assist       General bed mobility comments: Better tolerance of mobility today; mod assist and cues for technqiue to roll; Heavy mod assist to clear feet from EOB and elevate trunk to sitting  Transfers Overall transfer level: Needs assistance Equipment used: 1 person hand held assist Transfers: Sit to/from [employer] Transfers Sit to Stand: Mod assist Stand pivot transfers: Mod assist       General transfer comment: Heavy mod assist to power up to stand; Maod assist as well for support during pivot steps to chair  Ambulation/Gait             General Gait Details: Small pivot steps during transfer  bed to chair   Stairs            Wheelchair Mobility    Modified Rankin (Stroke Patients Only)       Balance Overall balance assessment: Needs assistance Sitting-balance support: Bilateral upper extremity supported Sitting balance-Leahy Scale: Fair       Standing balance-Leahy Scale: Poor                      Cognition Arousal/Alertness: Awake/alert Behavior During Therapy: WFL for tasks assessed/performed Overall Cognitive Status: Within Functional Limits for tasks assessed                      Exercises      General Comments General comments (skin integrity, edema, etc.): Session conducted on supplemental O2      Pertinent Vitals/Pain Pain Assessment: Faces Faces Pain Scale: Hurts a little bit Pain Location: abdominal discomfort Pain Descriptors / Indicators: Grimacing Pain Intervention(s): Monitored during session    Home Living                      Prior Function            PT Goals (current goals can now be found in the care plan section) Acute Rehab PT Goals Patient Stated Goal: does not state PT Goal Formulation: With patient Time For Goal Achievement: 01/19/16 Potential to Achieve Goals: Fair Progress towards PT goals: Progressing toward goals  Frequency  Min 3X/week    PT Plan Current plan remains appropriate    Co-evaluation             End of Session Equipment Utilized During Treatment: Gait belt Activity Tolerance: Patient tolerated treatment well;Patient limited by fatigue Patient left: in chair;with call bell/phone within reach     Time: [id number] PT Time Calculation (min) (ACUTE ONLY): 25 min  Charges:  $Therapeutic Activity: 23-37 mins                    G Codes:      Quin Hoop 01/16/2016, 10:42 AM  [name], PT  Acute Rehabilitation Services Pager [phone] Office [phone] '

## 2016-01-16 NOTE — Progress Notes (Signed)
Subjective: Patient has had several bowel movements Tolerating clear liquids without difficulty No complaints this morning  Objective: Vital signs in last 24 hours: Temp:  [97.9 F (36.6 C)-98.6 F (37 C)] 98.3 F (36.8 C) (05/29 0734) Pulse Rate:  [88-102] 89 (05/29 0734) Resp:  [13-22] 19 (05/29 0734) BP: (125-149)/(46-65) 130/62 mmHg (05/29 0734) SpO2:  [97 %-100 %] 98 % (05/29 0734) Weight:  [83.915 kg (185 lb)] 83.915 kg (185 lb) (05/29 0338) Last BM Date: 01/15/16  Intake/Output from previous day: 05/28 0701 - 05/29 0700 In: 1900 [P.O.:125; I.V.:1275; IV Piggyback:500] Out: [id number] [Urine:1825] Intake/Output this shift:    General appearance: alert, cooperative and no distress GI: soft, non-tender; active bowel sounds;  Lab Results:   Recent Labs  01/16/16 0322  WBC 8.2  HGB 9.0*  HCT 30.0*  PLT 273   BMET  Recent Labs  01/15/16 0355 01/16/16 0322  NA 144 140  K 3.2* 3.5  CL 114* 111  CO2 24 23  GLUCOSE 159* 165*  BUN 22* 10  CREATININE 0.74 0.64  CALCIUM 8.3* 8.4*   PT/INR No results for input(s): LABPROT, INR in the last 72 hours. ABG No results for input(s): PHART, HCO3 in the last 72 hours.  Invalid input(s): PCO2, PO2  Studies/Results: Dg Chest 2 View  01/16/2016  CLINICAL DATA:  Pneumonia EXAM: CHEST  2 VIEW COMPARISON:  01/12/2016 FINDINGS: Cardiac shadow is within normal limits. The lungs are well aerated bilaterally. Small bilateral pleural effusions are seen. No acute bony abnormality is noted. IMPRESSION: No active cardiopulmonary disease. Electronically Signed   By: Inez Catalina M.D.   On: 01/16/2016 07:38   Dg Abd 2 Views  01/15/2016  CLINICAL DATA:  24 hour delay radiograph for small bowel obstruction. EXAM: ABDOMEN - 2 VIEW COMPARISON:  Abdominal radiograph performed earlier today at 5:13 a.m. FINDINGS: Contrast has progressed to the colon. Previously noted dilated small bowel loops are less apparent, suggesting only partial small  bowel obstruction at this time. The stomach is partially filled with air and is grossly unremarkable in appearance. An enteric tube is seen ending overlying the body of the stomach. No free intra-abdominal air is seen on the provided decubitus view. No acute osseous abnormalities are identified. The visualized lung bases are grossly clear. IMPRESSION: Contrast has progressed to the colon. Previously noted dilated small bowel loops are less apparent, suggesting only partial small-bowel obstruction at this time. No free intra-abdominal air seen. Electronically Signed   By: Garald Balding M.D.   On: 01/15/2016 01:44    Anti-infectives: Anti-infectives    Start     Dose/Rate Route Frequency Ordered Stop   01/12/16 0900  cefTRIAXone (ROCEPHIN) 1 g in dextrose 5 % 50 mL IVPB     1 g 100 mL/hr over 30 Minutes Intravenous Every 24 hours 01/11/16 1251     01/12/16 0900  azithromycin (ZITHROMAX) 500 mg in dextrose 5 % 250 mL IVPB     500 mg 250 mL/hr over 60 Minutes Intravenous Every 24 hours 01/11/16 1251     01/11/16 0830  azithromycin (ZITHROMAX) 500 mg in dextrose 5 % 250 mL IVPB     500 mg 250 mL/hr over 60 Minutes Intravenous  Once 01/11/16 0820 01/11/16 0935   01/11/16 0830  cefTRIAXone (ROCEPHIN) 1 g in dextrose 5 % 50 mL IVPB     1 g 100 mL/hr over 30 Minutes Intravenous  Once 01/11/16 0820 01/11/16 0947      Assessment/Plan: SBO-likely secondary to  adhesions. Remove NG tube - full liquids ID-RLL PNA Hx CAD, HTN, HLD-followed by Dr. Claiborne Billings VTE prophylaxis-scd, would consider lovenox/heparin  Remove Nasogastric tube  Full liquids Advance as tolerated No surgical intervention needed - medical issues per primary team  LOS: 5 days    Annette Piehl K. 01/16/2016

## 2016-01-16 NOTE — Care Management Important Message (Signed)
Important Message  Patient Details  Name: ZETTA LUKOWSKI MRN: [id number] Date of Birth: 1924/09/30   Medicare Important Message Given:  Yes    Valeen Borys P Azlan Hanway 01/16/2016, 9:57 AM

## 2016-01-17 LAB — GLUCOSE, CAPILLARY
GLUCOSE-CAPILLARY: 112 mg/dL — AB (ref 65–99)
Glucose-Capillary: 115 mg/dL — ABNORMAL HIGH (ref 65–99)
Glucose-Capillary: 139 mg/dL — ABNORMAL HIGH (ref 65–99)
Glucose-Capillary: 147 mg/dL — ABNORMAL HIGH (ref 65–99)
Glucose-Capillary: 147 mg/dL — ABNORMAL HIGH (ref 65–99)
Glucose-Capillary: 166 mg/dL — ABNORMAL HIGH (ref 65–99)

## 2016-01-17 LAB — BASIC METABOLIC PANEL
Anion gap: 7 (ref 5–15)
BUN: 8 mg/dL (ref 6–20)
CALCIUM: 8.8 mg/dL — AB (ref 8.9–10.3)
CO2: 24 mmol/L (ref 22–32)
Chloride: 109 mmol/L (ref 101–111)
Creatinine, Ser: 0.64 mg/dL (ref 0.44–1.00)
GFR calc non Af Amer: >60 mL/min (ref >60)
GLUCOSE: 156 mg/dL — AB (ref 65–99)
Potassium: 3.5 mmol/L (ref 3.5–5.1)
Sodium: 140 mmol/L (ref 135–145)

## 2016-01-17 MED ORDER — POLYETHYLENE GLYCOL 3350 17 G PO PACK
17.0000 g | PACK | Freq: Every day | ORAL | Status: DC
  Administered 2016-01-17 – 2016-01-19 (×2): 17 g via ORAL
  Filled 2016-01-17 (×3): qty 1

## 2016-01-17 MED ORDER — ARFORMOTEROL TARTRATE 15 MCG/2ML IN NEBU
15.0000 ug | INHALATION_SOLUTION | Freq: Two times a day (BID) | RESPIRATORY_TRACT | Status: DC
  Administered 2016-01-17 – 2016-02-02 (×29): 15 ug via RESPIRATORY_TRACT
  Filled 2016-01-17 (×31): qty 2

## 2016-01-17 MED ORDER — UMECLIDINIUM BROMIDE 62.5 MCG/INH IN AEPB
1.0000 | INHALATION_SPRAY | Freq: Every day | RESPIRATORY_TRACT | Status: DC
  Administered 2016-01-18 – 2016-02-02 (×15): 1 via RESPIRATORY_TRACT
  Filled 2016-01-17 (×3): qty 7

## 2016-01-17 MED ORDER — ALBUTEROL SULFATE (2.5 MG/3ML) 0.083% IN NEBU: 3.0000 mL | INHALATION_SOLUTION | Freq: Four times a day (QID) | RESPIRATORY_TRACT | Status: DC | PRN

## 2016-01-17 MED ORDER — GUAIFENESIN ER 600 MG PO TB12
600.0000 mg | ORAL_TABLET | Freq: Two times a day (BID) | ORAL | Status: DC
  Administered 2016-01-17 – 2016-01-19 (×5): 600 mg via ORAL
  Filled 2016-01-17 (×5): qty 1

## 2016-01-17 MED ORDER — FUROSEMIDE 20 MG PO TABS
20.0000 mg | ORAL_TABLET | ORAL | Status: DC
  Administered 2016-01-17 – 2016-01-19 (×2): 20 mg via ORAL
  Filled 2016-01-17 (×3): qty 1

## 2016-01-17 MED ORDER — BOOST / RESOURCE BREEZE PO LIQD
1.0000 | Freq: Three times a day (TID) | ORAL | Status: DC
  Administered 2016-01-18 (×2): 1 via ORAL

## 2016-01-17 MED ORDER — METOPROLOL SUCCINATE ER 100 MG PO TB24
100.0000 mg | ORAL_TABLET | Freq: Every day | ORAL | Status: DC
  Administered 2016-01-17 – 2016-01-19 (×3): 100 mg via ORAL
  Filled 2016-01-17 (×3): qty 1

## 2016-01-17 MED ORDER — UMECLIDINIUM-VILANTEROL 62.5-25 MCG/INH IN AEPB: 1.0000 | INHALATION_SPRAY | Freq: Every day | RESPIRATORY_TRACT | Status: DC

## 2016-01-17 NOTE — Progress Notes (Signed)
PROGRESS NOTE    [name]  [id number] DOB: [birth year] DOA: 01/11/2016 PCP: [name], MD    Brief Narrative: [name] is a 82 y.o. female with a Past Medical History of asthma, CAD, DM, hypogonadism, GERD, CAD, who presents with sepsis from CAP and possible UTI. She was also found to have SBO from adhesions.   Assessment & Plan:   Principal Problem:   Sepsis due to pneumonia [facility]) Active Problems:   Diabetes mellitus type 2, controlled ([place])   Coronary atherosclerosis   Asthma with bronchitis   HTN (hypertension)   HLD (hyperlipidemia)   Hypothyroid   GERD (gastroesophageal reflux disease)   Diabetes mellitus type 2 without retinopathy ([place])   Sepsis ([place])   Acute on chronic respiratory failure (HCC)   SBO (small bowel obstruction) (HCC)   Hypokalemia   Sepsis from the  Right lower lobe pneumonia; Admitted to SDU, and currently on IV antibiotics.  [place] oxygen to keep sats greater than 90%. Normal lactic acid and minimal procalcitonin.  Wbc count normalized.  Repeat CXR in am shows improvement.  Plan to d/c antibiotics tomorrow before discharge.   Abdominal distention, nausea, vomiting ; CT shows evidence of high grade SBO. Surgery consulted and placed NG tube with intermittent suction.  Repeat ABD film ordered showed persistent sbo on 5/27. SBO protocol, she is passing flatulence, having Bm's . NG tube clamped and is on clears on 5/28. NG TUBE taken out on 5/29 as she is tolerating clears without nausea, vomiting and abdominal pain. She is having BM's .  On liquid die ton 5/29 able to tolerated without any issues, plan to advance to soft diet today and discharge to SNF tomorrow if able to tolerate..  Out of bed and ambulate.    Hypernatremia: changed fluids to dextrose fluids. Resolved.  D/c fluids.       Diabetes mellitus: Optimal control.  hgba1c is around 7.  CBG (last 3)   Recent Labs  01/17/16 0351 01/17/16 0717 01/17/16 1129  GLUCAP 139*  147* 166*    Resume SSI.    Hypertension; Controlled.    Hypothyroidism: Resume synthroid.   Hypokalemia: Replete as needed and repeat in am.   Acute urinary retention: Foley to be placed.   Anemia : normocytic.    DVT prophylaxis: /SCD's/ Code Status: (Full) Family Communication: son at bedside Disposition Plan: pending further management. Possibly to SNF when able to tolerate a regular diet.     Consultants:   surgery   Procedures:   CT abd and pelvis.    Antimicrobials:   Rocephin 5/24  zithromax 5/24   Subjective: Nausea improved, no vomiting today, passing flatulence. No BM today.  Wean her  off oxygen.   Objective: Filed Vitals:   01/16/16 1957 01/17/16 0010 01/17/16 0358 01/17/16 0731  BP: 154/35 155/58 142/94 139/85  Pulse: 102 94 102 95  Temp: 99.7 F (37.6 C) 98.2 F (36.8 C) 98.4 F (36.9 C) 98.3 F (36.8 C)  TempSrc: Oral Oral Oral Oral  Resp: 18 18 17 16   Height:      Weight:   82.918 kg (182 lb 12.8 oz)   SpO2: 97% 97% 96% 98%    Intake/Output Summary (Last 24 hours) at 01/17/16 1236 Last data filed at 01/17/16 0900  Gross per 24 hour  Intake    483 ml  Output   1125 ml  Net   -642 ml   Filed Weights   01/15/16 0500 01/16/16 0338 01/17/16  0358  Weight: 86.229 kg (190 lb 1.6 oz) 83.915 kg (185 lb) 82.918 kg (182 lb 12.8 oz)    Examination:  General exam: Appears UNCOMFORTABLE.   Respiratory system: diminished at bases, no wheezing or rhonchi. Respiratory effort normal. Cardiovascular system: S1 & S2 heard, RRR. No JVD, murmurs, rubs, gallops or clicks. No pedal edema. Gastrointestinal system: Abdomen distended, soft mildly generalized tenderness, . No organomegaly or masses felt. Decreased bowel sounds.  Central nervous system: Alert and oriented. No new focal neurological deficits. Extremities: Symmetric 5 x 5 power. Skin: No rashes, lesions or ulcers Psychiatry: Judgement and insight appear normal. Mood & affect  appropriate.     Data Reviewed: I have personally reviewed following labs and imaging studies  CBC:  Recent Labs Lab 01/11/16 0727  01/11/16 1225 01/12/16 0248 01/12/16 1819 01/13/16 0719 01/16/16 0322  WBC 13.8*  --  10.7* 8.8 9.2 6.2 8.2  NEUTROABS 10.3*  --  7.3  --   --   --   --   HGB 10.9*  < > 10.4* 10.2* 10.6* 9.7* 9.0*  HCT 35.2*  < > 32.9* 32.8* 34.6* 31.2* 30.0*  MCV 80.7  --  79.3 79.2 80.8 81.0 81.7  PLT 292  --  256 295 312 271 273  < > = values in this interval not displayed. Basic Metabolic Panel:  Recent Labs Lab 01/12/16 0248  01/13/16 0359 01/14/16 0604 01/15/16 0355 01/16/16 0322 01/17/16 0424  NA 140  < > 141 147* 144 140 140  K 2.7*  < > 3.8 3.5 3.2* 3.5 3.5  CL 107  < > 110 114* 114* 111 109  CO2 22  < > 24 23 24 23 24   GLUCOSE 177*  < > 165* 142* 159* 165* 156*  BUN 74*  < > 59* 36* 22* 10 8  CREATININE 1.49*  < > 1.21* 0.87 0.74 0.64 0.64  CALCIUM 8.3*  < > 8.2* 8.6* 8.3* 8.4* 8.8*  MG 2.3  --   --   --   --   --   --   < > = values in this interval not displayed. GFR: Estimated Creatinine Clearance: 47.4 mL/min (by C-G formula based on Cr of 0.64). Liver Function Tests:  Recent Labs Lab 01/11/16 0727 01/11/16 1225 01/12/16 0248  AST 20 19 27   ALT 17 16 27   ALKPHOS 43 42 41  BILITOT 0.5 0.4 0.5  PROT 6.2* 5.7* 5.8*  ALBUMIN 3.0* 2.7* 2.6*   No results for input(s): LIPASE, AMYLASE in the last 168 hours. No results for input(s): AMMONIA in the last 168 hours. Coagulation Profile:  Recent Labs Lab 01/11/16 1225 01/12/16 0248  INR 1.34 1.39   Cardiac Enzymes:  Recent Labs Lab 01/12/16 0248 01/12/16 1103 01/12/16 1544  TROPONINI 0.06* 0.05* 0.05*   BNP (last 3 results) No results for input(s): PROBNP in the last 8760 hours. HbA1C: No results for input(s): HGBA1C in the last 72 hours. CBG:  Recent Labs Lab 01/16/16 2001 01/17/16 0013 01/17/16 0351 01/17/16 0717 01/17/16 1129  GLUCAP 127* 147* 139* 147* 166*    Lipid Profile: No results for input(s): CHOL, HDL, LDLCALC, TRIG, CHOLHDL, LDLDIRECT in the last 72 hours. Thyroid Function Tests: No results for input(s): TSH, T4TOTAL, FREET4, T3FREE, THYROIDAB in the last 72 hours. Anemia Panel: No results for input(s): VITAMINB12, FOLATE, FERRITIN, TIBC, IRON, RETICCTPCT in the last 72 hours. Sepsis Labs:  Recent Labs Lab 01/11/16 0742 01/11/16 1100 01/11/16 1225 01/11/16 1533  PROCALCITON  --   --  0.26  --   LATICACIDVEN 1.90 1.08 1.5 1.2    Recent Results (from the past 240 hour(s))  Blood Culture (routine x 2)     Status: None   Collection Time: 01/11/16  7:57 AM  Result Value Ref Range Status   Specimen Description BLOOD LEFT ANTECUBITAL  Final   Special Requests IN PEDIATRIC BOTTLE 2CC  Final   Culture NO GROWTH 5 DAYS  Final   Report Status 01/16/2016 FINAL  Final  Blood Culture (routine x 2)     Status: None   Collection Time: 01/11/16  7:58 AM  Result Value Ref Range Status   Specimen Description BLOOD LEFT HAND  Final   Special Requests BOTTLES DRAWN AEROBIC ONLY 5CC  Final   Culture NO GROWTH 5 DAYS  Final   Report Status 01/16/2016 FINAL  Final  Urine culture     Status: None   Collection Time: 01/11/16  9:57 AM  Result Value Ref Range Status   Specimen Description URINE, CATHETERIZED  Final   Special Requests NONE  Final   Culture NO GROWTH  Final   Report Status 01/12/2016 FINAL  Final  MRSA PCR Screening     Status: None   Collection Time: 01/11/16  6:00 PM  Result Value Ref Range Status   MRSA by PCR NEGATIVE NEGATIVE Final    Comment:        The GeneXpert MRSA Assay (FDA approved for NASAL specimens only), is one component of a comprehensive MRSA colonization surveillance program. It is not intended to diagnose MRSA infection nor to guide or monitor treatment for MRSA infections.          Radiology Studies: Dg Chest 2 View  01/16/2016  CLINICAL DATA:  Pneumonia EXAM: CHEST  2 VIEW COMPARISON:   01/12/2016 FINDINGS: Cardiac shadow is within normal limits. The lungs are well aerated bilaterally. Small bilateral pleural effusions are seen. No acute bony abnormality is noted. IMPRESSION: No active cardiopulmonary disease. Electronically Signed   By: Inez Catalina M.D.   On: 01/16/2016 07:38        Scheduled Meds: . azithromycin  500 mg Intravenous Q24H  . cefTRIAXone (ROCEPHIN)  IV  1 g Intravenous Q24H  . clorazepate  15 mg Oral QHS  . ezetimibe  10 mg Oral Daily  . insulin aspart  0-9 Units Subcutaneous TID WC  . levothyroxine  25 mcg Oral QAC breakfast  . montelukast  10 mg Oral QHS  . pantoprazole  40 mg Oral Daily  . QUEtiapine  50 mg Oral Q breakfast  . rosuvastatin  20 mg Oral QHS  . sodium chloride flush  3 mL Intravenous Q12H   Continuous Infusions:     LOS: 6 days    Time spent: [place], MD [facility] Pager [phone]  If 7PM-7AM, please contact night-coverage [email or web address] Password [email or web address] 01/17/2016, 12:36 PM

## 2016-01-17 NOTE — Progress Notes (Signed)
Patient ID: [name], female   DOB: [birth year], 82 y.o.   MRN: [id number]     [address] [place], [place] [address]    Phone: [phone] FAX: [phone]     Subjective: C/o bloating.  Having bm/flatus. No n/v.  Tolerating fulls.   Objective:  Vital signs:  Filed Vitals:   01/16/16 1957 01/17/16 0010 01/17/16 0358 01/17/16 0731  BP: 154/35 155/58 142/94 139/85  Pulse: 102 94 102 95  Temp: 99.7 F (37.6 C) 98.2 F (36.8 C) 98.4 F (36.9 C) 98.3 F (36.8 C)  TempSrc: Oral Oral Oral Oral  Resp: _0 Height:      Weight:   82.918 kg (182 lb 12.8 oz)   SpO2: 97% 97% 96% 98%    Last BM Date: 01/16/16  Intake/Output   Yesterday:  05/29 0701 - 05/30 0700 In: 480 [P.O.:480] Out: 1125 [Urine:1125] This shift:  Total I/O In: 3 [I.V.:3] Out: -     Physical Exam: General: Pt awake/alert/oriented x4 in no acute distress Abdomen: Soft.  Non tender.  No evidence of peritonitis.  No incarcerated hernias.   Problem List:   Principal Problem:   Sepsis due to pneumonia [facility] Active Problems:   Diabetes mellitus type 2, controlled ([place])   Coronary atherosclerosis   Asthma with bronchitis   HTN (hypertension)   HLD (hyperlipidemia)   Hypothyroid   GERD (gastroesophageal reflux disease)   Diabetes mellitus type 2 without retinopathy ([place])   Sepsis ([place])   Acute on chronic respiratory failure ([place])   SBO (small bowel obstruction) ([place])   Hypokalemia    Results:   Labs: Results for orders placed or performed during the hospital encounter of 01/11/16 (from the past 48 hour(s))  Glucose, capillary     Status: Abnormal   Collection Time: 01/15/16 11:18 AM  Result Value Ref Range   Glucose-Capillary 116 (H) 65 - 99 mg/dL  Glucose, capillary     Status: Abnormal   Collection Time: 01/15/16  4:19 PM  Result Value Ref Range   Glucose-Capillary 152 (H) 65 - 99 mg/dL  Glucose, capillary      Status: Abnormal   Collection Time: 01/15/16  8:09 PM  Result Value Ref Range   Glucose-Capillary 149 (H) 65 - 99 mg/dL  Glucose, capillary     Status: Abnormal   Collection Time: 01/15/16 11:55 PM  Result Value Ref Range   Glucose-Capillary 131 (H) 65 - 99 mg/dL  CBC     Status: Abnormal   Collection Time: 01/16/16  3:22 AM  Result Value Ref Range   WBC 8.2 4.0 - 10.5 K/uL   RBC 3.67 (L) 3.87 - 5.11 MIL/uL   Hemoglobin 9.0 (L) 12.0 - 15.0 g/dL   HCT 30.0 (L) 36.0 - 46.0 %   MCV 81.7 78.0 - 100.0 fL   MCH 24.5 (L) 26.0 - 34.0 pg   MCHC 30.0 30.0 - 36.0 g/dL   RDW 14.7 11.5 - 15.5 %   Platelets 273 150 - 400 K/uL  Basic metabolic panel     Status: Abnormal   Collection Time: 01/16/16  3:22 AM  Result Value Ref Range   Sodium 140 135 - 145 mmol/L   Potassium 3.5 3.5 - 5.1 mmol/L   Chloride 111 101 - 111 mmol/L   CO2 23 22 - 32 mmol/L   Glucose, Bld 165 (H) 65 - 99 mg/dL  BUN 10 6 - 20 mg/dL   Creatinine, Ser 0.64 0.44 - 1.00 mg/dL   Calcium 8.4 (L) 8.9 - 10.3 mg/dL   GFR calc non Af Amer >60 >60 mL/min   GFR calc Af Amer >60 >60 mL/min    Comment: (NOTE) The eGFR has been calculated using the CKD EPI equation. This calculation has not been validated in all clinical situations. eGFR's persistently <60 mL/min signify possible Chronic Kidney Disease.    Anion gap 6 5 - 15  Glucose, capillary     Status: Abnormal   Collection Time: 01/16/16  3:41 AM  Result Value Ref Range   Glucose-Capillary 152 (H) 65 - 99 mg/dL  Glucose, capillary     Status: Abnormal   Collection Time: 01/16/16  7:19 AM  Result Value Ref Range   Glucose-Capillary 161 (H) 65 - 99 mg/dL  Glucose, capillary     Status: Abnormal   Collection Time: 01/16/16 11:24 AM  Result Value Ref Range   Glucose-Capillary 155 (H) 65 - 99 mg/dL  Glucose, capillary     Status: Abnormal   Collection Time: 01/16/16  4:32 PM  Result Value Ref Range   Glucose-Capillary 112 (H) 65 - 99 mg/dL  Glucose, capillary      Status: Abnormal   Collection Time: 01/16/16  8:01 PM  Result Value Ref Range   Glucose-Capillary 127 (H) 65 - 99 mg/dL  Glucose, capillary     Status: Abnormal   Collection Time: 01/17/16 12:13 AM  Result Value Ref Range   Glucose-Capillary 147 (H) 65 - 99 mg/dL  Glucose, capillary     Status: Abnormal   Collection Time: 01/17/16  3:51 AM  Result Value Ref Range   Glucose-Capillary 139 (H) 65 - 99 mg/dL  Basic metabolic panel     Status: Abnormal   Collection Time: 01/17/16  4:24 AM  Result Value Ref Range   Sodium 140 135 - 145 mmol/L   Potassium 3.5 3.5 - 5.1 mmol/L   Chloride 109 101 - 111 mmol/L   CO2 24 22 - 32 mmol/L   Glucose, Bld 156 (H) 65 - 99 mg/dL   BUN 8 6 - 20 mg/dL   Creatinine, Ser 0.64 0.44 - 1.00 mg/dL   Calcium 8.8 (L) 8.9 - 10.3 mg/dL   GFR calc non Af Amer >60 >60 mL/min   GFR calc Af Amer >60 >60 mL/min    Comment: (NOTE) The eGFR has been calculated using the CKD EPI equation. This calculation has not been validated in all clinical situations. eGFR's persistently <60 mL/min signify possible Chronic Kidney Disease.    Anion gap 7 5 - 15  Glucose, capillary     Status: Abnormal   Collection Time: 01/17/16  7:17 AM  Result Value Ref Range   Glucose-Capillary 147 (H) 65 - 99 mg/dL    Imaging / Studies: Dg Chest 2 View  01/16/2016  CLINICAL DATA:  Pneumonia EXAM: CHEST  2 VIEW COMPARISON:  01/12/2016 FINDINGS: Cardiac shadow is within normal limits. The lungs are well aerated bilaterally. Small bilateral pleural effusions are seen. No acute bony abnormality is noted. IMPRESSION: No active cardiopulmonary disease. Electronically Signed   By: Inez Catalina M.D.   On: 01/16/2016 07:38    Medications / Allergies:  Scheduled Meds: . azithromycin  500 mg Intravenous Q24H  . cefTRIAXone (ROCEPHIN)  IV  1 g Intravenous Q24H  . clorazepate  15 mg Oral QHS  . ezetimibe  10 mg Oral Daily  .  insulin aspart  0-9 Units Subcutaneous TID WC  . levothyroxine  25 mcg  Oral QAC breakfast  . metoprolol  5 mg Intravenous Q6H  . montelukast  10 mg Oral QHS  . pantoprazole  40 mg Oral Daily  . QUEtiapine  50 mg Oral Q breakfast  . rosuvastatin  20 mg Oral QHS  . sodium chloride flush  3 mL Intravenous Q12H   Continuous Infusions:  PRN Meds:.acetaminophen **OR** acetaminophen, bisacodyl, HYDROcodone-acetaminophen, ipratropium-albuterol, LORazepam, morphine injection, ondansetron **OR** ondansetron (ZOFRAN) IV, phenol, promethazine, senna-docusate, simethicone, traMADol  Antibiotics: Anti-infectives    Start     Dose/Rate Route Frequency Ordered Stop   01/12/16 0900  cefTRIAXone (ROCEPHIN) 1 g in dextrose 5 % 50 mL IVPB     1 g 100 mL/hr over 30 Minutes Intravenous Every 24 hours 01/11/16 1251     01/12/16 0900  azithromycin (ZITHROMAX) 500 mg in dextrose 5 % 250 mL IVPB     500 mg 250 mL/hr over 60 Minutes Intravenous Every 24 hours 01/11/16 1251     01/11/16 0830  azithromycin (ZITHROMAX) 500 mg in dextrose 5 % 250 mL IVPB     500 mg 250 mL/hr over 60 Minutes Intravenous  Once 01/11/16 0820 01/11/16 0935   01/11/16 0830  cefTRIAXone (ROCEPHIN) 1 g in dextrose 5 % 50 mL IVPB     1 g 100 mL/hr over 30 Minutes Intravenous  Once 01/11/16 0820 01/11/16 0947         Assessment/Plan: SBO-likely secondary to adhesions. Advance to soft diet ID-RLL PNA Hx CAD, HTN, HLD-followed by Dr. Claiborne Billings VTE prophylaxis-scd, would consider lovenox/heparin Dispo-per primary team   Erby Pian, [facility] Surgery Pager [phone](7A-4:30P) For consults and floor pages call [phone](7A-4:30P)  01/17/2016 8:20 AM

## 2016-01-17 NOTE — Progress Notes (Signed)
Physical Therapy Treatment Patient Details Name: [name] MRN: [id number] DOB: 1924-09-11 Today's Date: 01/17/2016    History of Present Illness 82 y.o. female adm with 3-4d of SOB, also with abd distension, found to have SBO d/y adhesion;    PMHx: Asthma not on home oxygen, CAD, DM2, hypothyroidism, GERD, CAD, OA, R TKA    PT Comments    Pt progressing although continues to fatigue easily and is at risk for falls; continue to recommend SNF for  post acute rehab;  Follow Up Recommendations  SNF     Equipment Recommendations  None recommended by PT    Recommendations for Other Services       Precautions / Restrictions Precautions Precautions: Fall Restrictions Weight Bearing Restrictions: No    Mobility  Bed Mobility Overal bed mobility: Needs Assistance Bed Mobility: Rolling;Sidelying to Sit Rolling: Min assist Sidelying to sit: Min assist;HOB elevated       General bed mobility comments: less assist with bed mobility, requires incr time and light assist with LEs, more assist with trunk, pt uses bedrail   Transfers Overall transfer level: Needs assistance Equipment used: Rolling walker (2 wheeled) Transfers: Sit to/from Stand Sit to Stand: Min assist         General transfer comment: assist to rise and stabilize, incr pt effort today  Ambulation/Gait Ambulation/Gait assistance: Min assist Ambulation Distance (Feet): 35 Feet Assistive device: Rolling walker (2 wheeled) Gait Pattern/deviations: Step-through pattern;Decreased stride length     General Gait Details: cues for RW position, posture; assist to maneuver RW intermittently,unsteady initially but improved with distance   Stairs            Wheelchair Mobility    Modified Rankin (Stroke Patients Only)       Balance     Sitting balance-Leahy Scale: Fair     Standing balance support: Bilateral upper extremity supported Standing balance-Leahy Scale: Poor Standing balance comment:  reliant on UEs for stability                    Cognition Arousal/Alertness: Awake/alert Behavior During Therapy: WFL for tasks assessed/performed Overall Cognitive Status: Within Functional Limits for tasks assessed                      Exercises      General Comments        Pertinent Vitals/Pain Pain Assessment: Faces Faces Pain Scale: Hurts a little bit Pain Location: abd  Pain Descriptors / Indicators: Tightness Pain Intervention(s): Limited activity within patient's tolerance;Monitored during session    Home Living                      Prior Function            PT Goals (current goals can now be found in the care plan section) Acute Rehab PT Goals Patient Stated Goal: to feel better PT Goal Formulation: With patient Time For Goal Achievement: 01/26/16 Potential to Achieve Goals: Good Progress towards PT goals: Progressing toward goals    Frequency  Min 3X/week    PT Plan Current plan remains appropriate    Co-evaluation             End of Session Equipment Utilized During Treatment: Gait belt Activity Tolerance: Patient tolerated treatment well;Patient limited by fatigue Patient left: in chair;with call bell/phone within reach     Time: 1124-1150 PT Time Calculation (min) (ACUTE ONLY): 26 min  Charges:  $Gait  Training: 23-37 mins                    G Codes:      Ronnetta Currington February 02, 2016, 12:11 PM

## 2016-01-18 ENCOUNTER — Inpatient Hospital Stay (HOSPITAL_COMMUNITY): Payer: Medicare Other

## 2016-01-18 DIAGNOSIS — Z01810 Encounter for preprocedural cardiovascular examination: Secondary | ICD-10-CM

## 2016-01-18 LAB — BASIC METABOLIC PANEL
Anion gap: 6 (ref 5–15)
BUN: 7 mg/dL (ref 6–20)
CALCIUM: 8.8 mg/dL — AB (ref 8.9–10.3)
CO2: 26 mmol/L (ref 22–32)
CREATININE: 0.79 mg/dL (ref 0.44–1.00)
Chloride: 108 mmol/L (ref 101–111)
GFR calc Af Amer: >60 mL/min (ref >60)
GFR calc non Af Amer: >60 mL/min (ref >60)
Glucose, Bld: 142 mg/dL — ABNORMAL HIGH (ref 65–99)
Potassium: 3.8 mmol/L (ref 3.5–5.1)
Sodium: 140 mmol/L (ref 135–145)

## 2016-01-18 LAB — GLUCOSE, CAPILLARY
GLUCOSE-CAPILLARY: 136 mg/dL — AB (ref 65–99)
GLUCOSE-CAPILLARY: 142 mg/dL — AB (ref 65–99)
GLUCOSE-CAPILLARY: 145 mg/dL — AB (ref 65–99)
Glucose-Capillary: 136 mg/dL — ABNORMAL HIGH (ref 65–99)
Glucose-Capillary: 172 mg/dL — ABNORMAL HIGH (ref 65–99)
Glucose-Capillary: 223 mg/dL — ABNORMAL HIGH (ref 65–99)

## 2016-01-18 LAB — PREALBUMIN: PREALBUMIN: 15.5 mg/dL — AB (ref 18–38)

## 2016-01-18 MED ORDER — HEPARIN SODIUM (PORCINE) 5000 UNIT/ML IJ SOLN
5000.0000 [IU] | Freq: Three times a day (TID) | INTRAMUSCULAR | Status: DC
  Administered 2016-01-18 – 2016-01-19 (×3): 5000 [IU] via SUBCUTANEOUS
  Filled 2016-01-18 (×3): qty 1

## 2016-01-18 MED ORDER — ASPIRIN EC 81 MG PO TBEC
81.0000 mg | DELAYED_RELEASE_TABLET | Freq: Every day | ORAL | Status: DC
  Administered 2016-01-18: 81 mg via ORAL
  Filled 2016-01-18: qty 1

## 2016-01-18 MED ORDER — AZELASTINE HCL 0.1 % NA SOLN
1.0000 | Freq: Two times a day (BID) | NASAL | Status: DC
  Administered 2016-01-18 – 2016-02-02 (×25): 1 via NASAL
  Filled 2016-01-18 (×3): qty 30

## 2016-01-18 MED ORDER — TOPIRAMATE 25 MG PO TABS
25.0000 mg | ORAL_TABLET | Freq: Every day | ORAL | Status: DC
  Administered 2016-01-18 – 2016-02-01 (×13): 25 mg via ORAL
  Filled 2016-01-18 (×16): qty 1

## 2016-01-18 MED ORDER — NITROGLYCERIN 0.4 MG SL SUBL: 0.4000 mg | SUBLINGUAL_TABLET | SUBLINGUAL | Status: DC | PRN

## 2016-01-18 NOTE — Progress Notes (Addendum)
PROGRESS NOTE  [name]  ZHG:[id number] DOB: 1924-09-03  DOA: 01/11/2016 PCP: [name], MD   Brief Narrative:  82 year old female patient with a PMH of asthma, CAD, chronic diastolic CHF, DM, hypothyroid, GERD, HTN, presented to [facility] ED on 01/11/16 with 3-4 day history of dyspnea and productive cough, failed outpatient azithromycin, admitted with diagnosis of sepsis secondary to LLL CAP   Assessment & Plan:   Principal Problem:   Sepsis due to pneumonia [facility]) Active Problems:   Diabetes mellitus type 2, controlled ([place])   Coronary atherosclerosis   Asthma with bronchitis   HTN (hypertension)   HLD (hyperlipidemia)   Hypothyroid   GERD (gastroesophageal reflux disease)   Diabetes mellitus type 2 without retinopathy ([place])   Sepsis ([place])   Acute on chronic respiratory failure ([place])   SBO (small bowel obstruction) ([place])   Hypokalemia   Sepsis due to Right lower lobe community-acquired pneumonia - Met sepsis criteria on admission. - Started empirically on Rocephin and azithromycin. Initially admitted to stepdown unit. - Blood cultures 2: Negative. Urine culture times one: Negative. Lactate: 1.9 > 1.2. Pro-calcitonin 0.26. - Has completed 8 days of IV antibiotics. DC all antibiotics and monitor. - Repeat chest x-ray 5/29 shows resolution. - Sepsis resolved.  Small bowel obstruction - Felt to be secondary to adhesions. - Gen. surgery consulted. Treated per SBO protocol. Managed conservatively with nothing by mouth, NG tube. Plavix was held in case surgical intervention was needed. - Clinically improved and NG tube was removed on 5/29. Diet was gradually advanced. However she complained of abdominal distention/bloating and diet was downgraded to full liquids on 5/30. - Surgeons concerned regarding partial SBO and checking repeat abdominal x-ray   Acute kidney injury  - Presented with creatinine of 2.49. Likely secondary to dehydration and intravascular volume depletion.  Resolved. Creatinine has been normal for the last several days.   Acute respiratory failure with hypoxia - Secondary to community-acquired pneumonia. Resolved.  Anemia  - Stable.   Leukocytosis - Resolved   Type II DM - Good inpatient control. Continue SSI. Oral hypoglycemics held.  Chronic diastolic CHF - Compensated. Continue home dose Lasix.   Hypothyroid - Continue Synthroid   Essential hypertension - Controlled on metoprolol.  Hyperlipidemia -Statins    GERD - PPI   Anxiety & depression  - Continue home medications.   CAD - Minimally elevated troponin but flat trend. No chest pain reported. Likely demand ischemia related to acute illness. Aspirin resumed. Plavix held in case surgery needed. Will need cardiology preoperative clearance if surgery needed.  Hypokalemia - Resolved.   Acute urinary retention - Seems to have resolved.   Asthma - Stable.  DVT prophylaxis: SCDs, subcutaneous heparin Code Status: Full Family Communication: Discussed with patient. No family at bedside. Disposition Plan: DC to SNF when medically stable.   Consultants:   General surgery  Procedures:   NG tube-discontinued  Antimicrobials:   IV Rocephin 5/24 > 5/31   Azithromycin  5/24 > 5/31    Subjective: States that she has been having BM daily. Abdomen less bloated than yesterday. Denies dyspnea, chest pain or cough.   Objective:  Filed Vitals:   01/18/16 0001 01/18/16 0438 01/18/16 0850 01/18/16 0946  BP: 120/63 124/57  130/60  Pulse: 102 102  99  Temp: 99 F (37.2 C) 98.4 F (36.9 C)    TempSrc: Oral Oral    Resp: 18 18    Height:      Weight:  82.328 kg (181  lb 8 oz)    SpO2: 96% 95% 95%     Intake/Output Summary (Last 24 hours) at 01/18/16 1141 Last data filed at 01/18/16 0441  Gross per 24 hour  Intake    480 ml  Output   1100 ml  Net   -620 ml   Filed Weights   01/16/16 0338 01/17/16 0358 01/18/16 0438  Weight: 83.915 kg (185 lb) 82.918 kg  (182 lb 12.8 oz) 82.328 kg (181 lb 8 oz)    Examination:  General exam: Small built and frail pleasant elderly female sitting up in bed eating breakfast/liquids this morning. Respiratory system: Clear to auscultation. Respiratory effort normal. Cardiovascular system: S1 & S2 heard, RRR. No JVD, murmurs, rubs, gallops or clicks. No pedal edema. Gastrointestinal system: Abdomen is mildly distended, soft and nontender. No organomegaly or masses felt. Normal bowel sounds heard. Central nervous system: Alert and oriented. No focal neurological deficits. Extremities: Symmetric 5 x 5 power. Skin: No rashes, lesions or ulcers Psychiatry: Judgement and insight appear normal. Mood & affect appropriate.     Data Reviewed: I have personally reviewed following labs and imaging studies  CBC:  Recent Labs Lab 01/11/16 1225 01/12/16 0248 01/12/16 1819 01/13/16 0719 01/16/16 0322  WBC 10.7* 8.8 9.2 6.2 8.2  NEUTROABS 7.3  --   --   --   --   HGB 10.4* 10.2* 10.6* 9.7* 9.0*  HCT 32.9* 32.8* 34.6* 31.2* 30.0*  MCV 79.3 79.2 80.8 81.0 81.7  PLT 256 295 312 271 388   Basic Metabolic Panel:  Recent Labs Lab 01/12/16 0248  01/14/16 0604 01/15/16 0355 01/16/16 0322 01/17/16 0424 01/18/16 0523  NA 140  < > 147* 144 140 140 140  K 2.7*  < > 3.5 3.2* 3.5 3.5 3.8  CL 107  < > 114* 114* 111 109 108  CO2 22  < > _0 GLUCOSE 177*  < > 142* 159* 165* 156* 142*  BUN 74*  < > 36* 22* _1 CREATININE 1.49*  < > 0.87 0.74 0.64 0.64 0.79  CALCIUM 8.3*  < > 8.6* 8.3* 8.4* 8.8* 8.8*  MG 2.3  --   --   --   --   --   --   < > = values in this interval not displayed. GFR: Estimated Creatinine Clearance: 47.3 mL/min (by C-G formula based on Cr of 0.79). Liver Function Tests:  Recent Labs Lab 01/11/16 1225 01/12/16 0248  AST 19 27  ALT 16 27  ALKPHOS 42 41  BILITOT 0.4 0.5  PROT 5.7* 5.8*  ALBUMIN 2.7* 2.6*   No results for input(s): LIPASE, AMYLASE in the last 168 hours. No  results for input(s): AMMONIA in the last 168 hours. Coagulation Profile:  Recent Labs Lab 01/11/16 1225 01/12/16 0248  INR 1.34 1.39   Cardiac Enzymes:  Recent Labs Lab 01/12/16 0248 01/12/16 1103 01/12/16 1544  TROPONINI 0.06* 0.05* 0.05*   BNP (last 3 results) No results for input(s): PROBNP in the last 8760 hours. HbA1C: No results for input(s): HGBA1C in the last 72 hours. CBG:  Recent Labs Lab 01/17/16 1635 01/17/16 1950 01/18/16 01/18/16 0440 01/18/16 0723  GLUCAP 115* 112* 145* 136* 136*   Lipid Profile: No results for input(s): CHOL, HDL, LDLCALC, TRIG, CHOLHDL, LDLDIRECT in the last 72 hours. Thyroid Function Tests: No results for input(s): TSH, T4TOTAL, FREET4, T3FREE, THYROIDAB in the last 72 hours. Anemia Panel: No results for input(s): VITAMINB12, FOLATE,  FERRITIN, TIBC, IRON, RETICCTPCT in the last 72 hours.  Sepsis Labs:  Recent Labs Lab 01/11/16 1225 01/11/16 1533  PROCALCITON 0.26  --   LATICACIDVEN 1.5 1.2    Recent Results (from the past 240 hour(s))  Blood Culture (routine x 2)     Status: None   Collection Time: 01/11/16  7:57 AM  Result Value Ref Range Status   Specimen Description BLOOD LEFT ANTECUBITAL  Final   Special Requests IN PEDIATRIC BOTTLE 2CC  Final   Culture NO GROWTH 5 DAYS  Final   Report Status 01/16/2016 FINAL  Final  Blood Culture (routine x 2)     Status: None   Collection Time: 01/11/16  7:58 AM  Result Value Ref Range Status   Specimen Description BLOOD LEFT HAND  Final   Special Requests BOTTLES DRAWN AEROBIC ONLY 5CC  Final   Culture NO GROWTH 5 DAYS  Final   Report Status 01/16/2016 FINAL  Final  Urine culture     Status: None   Collection Time: 01/11/16  9:57 AM  Result Value Ref Range Status   Specimen Description URINE, CATHETERIZED  Final   Special Requests NONE  Final   Culture NO GROWTH  Final   Report Status 01/12/2016 FINAL  Final  MRSA PCR Screening     Status: None   Collection Time:  01/11/16  6:00 PM  Result Value Ref Range Status   MRSA by PCR NEGATIVE NEGATIVE Final    Comment:        The GeneXpert MRSA Assay (FDA approved for NASAL specimens only), is one component of a comprehensive MRSA colonization surveillance program. It is not intended to diagnose MRSA infection nor to guide or monitor treatment for MRSA infections.          Radiology Studies: Dg Abd 2 Views  01/18/2016  CLINICAL DATA:  Followup for bowel obstruction. EXAM: ABDOMEN - 2 VIEW COMPARISON:  01/14/2016 FINDINGS: There are distended loops of small bowel with multiple air-fluid levels. Small bowel dilation appears increased when compared to the most recent prior exam. Air and contrast is seen within normal caliber colon. There is no free air. IMPRESSION: 1. Findings consistent with a partial small bowel obstruction. The degree of small bowel dilation has increased since the prior exam. No free air. Electronically Signed   By: Lajean Manes M.D.   On: 01/18/2016 11:23        Scheduled Meds: . arformoterol  15 mcg Nebulization BID   And  . umeclidinium bromide  1 puff Inhalation Daily  . clorazepate  15 mg Oral QHS  . ezetimibe  10 mg Oral Daily  . feeding supplement  1 Container Oral TID BM  . furosemide  20 mg Oral QODAY  . guaiFENesin  600 mg Oral BID  . heparin subcutaneous  5,000 Units Subcutaneous Q8H  . insulin aspart  0-9 Units Subcutaneous TID WC  . levothyroxine  25 mcg Oral QAC breakfast  . metoprolol succinate  100 mg Oral Daily  . montelukast  10 mg Oral QHS  . pantoprazole  40 mg Oral Daily  . polyethylene glycol  17 g Oral Daily  . QUEtiapine  50 mg Oral Q breakfast  . rosuvastatin  20 mg Oral QHS  . sodium chloride flush  3 mL Intravenous Q12H   Continuous Infusions:    LOS: 7 days    Time spent: 40 minutes.    Vernell Leep, MD [facility] Pager [phone]  If 7PM-7AM, please  contact night-coverage [email or web address] Password [email or web address] 01/18/2016,  11:41 AM

## 2016-01-18 NOTE — Progress Notes (Signed)
Patient ID: [name], female   DOB: [birth year], 82 y.o.   MRN: [id number]     [facility]      [address] [place], [place] [address]    Phone: [phone] FAX: [phone]     Subjective: Had a bm yesterday.  Burping.  Passing flatus.  Still distended.   Objective:  Vital signs:  Filed Vitals:   01/18/16 0001 01/18/16 0438 01/18/16 0850 01/18/16 0946  BP: 120/63 124/57  130/60  Pulse: 102 102  99  Temp: 99 F (37.2 C) 98.4 F (36.9 C)    TempSrc: Oral Oral    Resp: 18 18    Height:      Weight:  82.328 kg (181 lb 8 oz)    SpO2: 96% 95% 95%     Last BM Date: 01/16/16  Intake/Output   Yesterday:  05/30 0701 - 05/31 0700 In: 603 [P.O.:600; I.V.:3] Out: 1100 [Urine:1100] This shift:     Physical Exam: General: Pt awake/alert/oriented x4 in no acute distress  Abdomen: Soft.  Distended.  Non tender.  No evidence of peritonitis.  No incarcerated hernias.    Problem List:   Principal Problem:   Sepsis due to pneumonia [facility]) Active Problems:   Diabetes mellitus type 2, controlled ([place])   Coronary atherosclerosis   Asthma with bronchitis   HTN (hypertension)   HLD (hyperlipidemia)   Hypothyroid   GERD (gastroesophageal reflux disease)   Diabetes mellitus type 2 without retinopathy ([place])   Sepsis ([place])   Acute on chronic respiratory failure ([place])   SBO (small bowel obstruction) ([place])   Hypokalemia    Results:   Labs: Results for orders placed or performed during the hospital encounter of 01/11/16 (from the past 48 hour(s))  Glucose, capillary     Status: Abnormal   Collection Time: 01/16/16 11:24 AM  Result Value Ref Range   Glucose-Capillary 155 (H) 65 - 99 mg/dL  Glucose, capillary     Status: Abnormal   Collection Time: 01/16/16  4:32 PM  Result Value Ref Range   Glucose-Capillary 112 (H) 65 - 99 mg/dL  Glucose, capillary     Status: Abnormal   Collection Time: 01/16/16  8:01 PM  Result  Value Ref Range   Glucose-Capillary 127 (H) 65 - 99 mg/dL  Glucose, capillary     Status: Abnormal   Collection Time: 01/17/16 12:13 AM  Result Value Ref Range   Glucose-Capillary 147 (H) 65 - 99 mg/dL  Glucose, capillary     Status: Abnormal   Collection Time: 01/17/16  3:51 AM  Result Value Ref Range   Glucose-Capillary 139 (H) 65 - 99 mg/dL  Basic metabolic panel     Status: Abnormal   Collection Time: 01/17/16  4:24 AM  Result Value Ref Range   Sodium 140 135 - 145 mmol/L   Potassium 3.5 3.5 - 5.1 mmol/L   Chloride 109 101 - 111 mmol/L   CO2 24 22 - 32 mmol/L   Glucose, Bld 156 (H) 65 - 99 mg/dL   BUN 8 6 - 20 mg/dL   Creatinine, Ser 0.64 0.44 - 1.00 mg/dL   Calcium 8.8 (L) 8.9 - 10.3 mg/dL   GFR calc non Af Amer >60 >60 mL/min   GFR calc Af Amer >60 >60 mL/min    Comment: (NOTE) The eGFR has been calculated using the CKD EPI equation. This calculation has not been validated in all clinical situations. eGFR's persistently <60 mL/min  signify possible Chronic Kidney Disease.    Anion gap 7 5 - 15  Glucose, capillary     Status: Abnormal   Collection Time: 01/17/16  7:17 AM  Result Value Ref Range   Glucose-Capillary 147 (H) 65 - 99 mg/dL  Glucose, capillary     Status: Abnormal   Collection Time: 01/17/16 11:29 AM  Result Value Ref Range   Glucose-Capillary 166 (H) 65 - 99 mg/dL  Glucose, capillary     Status: Abnormal   Collection Time: 01/17/16  4:35 PM  Result Value Ref Range   Glucose-Capillary 115 (H) 65 - 99 mg/dL  Glucose, capillary     Status: Abnormal   Collection Time: 01/17/16  7:50 PM  Result Value Ref Range   Glucose-Capillary 112 (H) 65 - 99 mg/dL  Glucose, capillary     Status: Abnormal   Collection Time: 01/18/16 12:00 AM  Result Value Ref Range   Glucose-Capillary 145 (H) 65 - 99 mg/dL  Glucose, capillary     Status: Abnormal   Collection Time: 01/18/16  4:40 AM  Result Value Ref Range   Glucose-Capillary 136 (H) 65 - 99 mg/dL  Basic metabolic  panel     Status: Abnormal   Collection Time: 01/18/16  5:23 AM  Result Value Ref Range   Sodium 140 135 - 145 mmol/L   Potassium 3.8 3.5 - 5.1 mmol/L   Chloride 108 101 - 111 mmol/L   CO2 26 22 - 32 mmol/L   Glucose, Bld 142 (H) 65 - 99 mg/dL   BUN 7 6 - 20 mg/dL   Creatinine, Ser 0.79 0.44 - 1.00 mg/dL   Calcium 8.8 (L) 8.9 - 10.3 mg/dL   GFR calc non Af Amer >60 >60 mL/min   GFR calc Af Amer >60 >60 mL/min    Comment: (NOTE) The eGFR has been calculated using the CKD EPI equation. This calculation has not been validated in all clinical situations. eGFR's persistently <60 mL/min signify possible Chronic Kidney Disease.    Anion gap 6 5 - 15  Glucose, capillary     Status: Abnormal   Collection Time: 01/18/16  7:23 AM  Result Value Ref Range   Glucose-Capillary 136 (H) 65 - 99 mg/dL   Comment 1 Notify RN    Comment 2 Document in Chart     Imaging / Studies: No results found.  Medications / Allergies:  Scheduled Meds: . arformoterol  15 mcg Nebulization BID   And  . umeclidinium bromide  1 puff Inhalation Daily  . azithromycin  500 mg Intravenous Q24H  . cefTRIAXone (ROCEPHIN)  IV  1 g Intravenous Q24H  . clorazepate  15 mg Oral QHS  . ezetimibe  10 mg Oral Daily  . feeding supplement  1 Container Oral TID BM  . furosemide  20 mg Oral QODAY  . guaiFENesin  600 mg Oral BID  . insulin aspart  0-9 Units Subcutaneous TID WC  . levothyroxine  25 mcg Oral QAC breakfast  . metoprolol succinate  100 mg Oral Daily  . montelukast  10 mg Oral QHS  . pantoprazole  40 mg Oral Daily  . polyethylene glycol  17 g Oral Daily  . QUEtiapine  50 mg Oral Q breakfast  . rosuvastatin  20 mg Oral QHS  . sodium chloride flush  3 mL Intravenous Q12H   Continuous Infusions:  PRN Meds:.acetaminophen **OR** acetaminophen, albuterol, bisacodyl, HYDROcodone-acetaminophen, ipratropium-albuterol, LORazepam, morphine injection, ondansetron **OR** ondansetron (ZOFRAN) IV, phenol, promethazine,  senna-docusate, simethicone,  traMADol  Antibiotics: Anti-infectives    Start     Dose/Rate Route Frequency Ordered Stop   01/12/16 0900  cefTRIAXone (ROCEPHIN) 1 g in dextrose 5 % 50 mL IVPB     1 g 100 mL/hr over 30 Minutes Intravenous Every 24 hours 01/11/16 1251 01/18/16 2359   01/12/16 0900  azithromycin (ZITHROMAX) 500 mg in dextrose 5 % 250 mL IVPB     500 mg 250 mL/hr over 60 Minutes Intravenous Every 24 hours 01/11/16 1251 01/18/16 2359   01/11/16 0830  azithromycin (ZITHROMAX) 500 mg in dextrose 5 % 250 mL IVPB     500 mg 250 mL/hr over 60 Minutes Intravenous  Once 01/11/16 0820 01/11/16 0935   01/11/16 0830  cefTRIAXone (ROCEPHIN) 1 g in dextrose 5 % 50 mL IVPB     1 g 100 mL/hr over 30 Minutes Intravenous  Once 01/11/16 0820 01/11/16 0947       Assessment/Plan: pSBO-likely secondary to adhesions.  Continue with full liquid diet.  Still bloated, concerned she still may be partially obstructed. Check AXR.  Continue with miralax -needs to be mobilized.  ID-RLL PNA Hx CAD, HTN, HLD-followed by Dr. Claiborne Billings VTE prophylaxis-scd, still not started, Ill add heparin Dispo-not ready for DC.  AXR.   Erby Pian, [facility] Surgery Pager [phone]) For consults and floor pages call [phone])  01/18/2016 10:17 AM

## 2016-01-18 NOTE — Progress Notes (Signed)
Pharmacy Consult - new TPN   3 yof with bowel obstruction, not improving. Pharmacy consulted to start TPN. Per policy, TPN will start in the AM due to being ordered after hours. Will put in dietician consult and TPN labs.   Elicia Lamp, PharmD, BCPS Clinical Pharmacist Pager [phone] 01/18/2016 4:18 PM

## 2016-01-18 NOTE — Consult Note (Signed)
   [facility] CM Inpatient Consult   01/18/2016  Tamu Malsam Pennie Oct 24, 1924 [id number]  Patient screened for potential [facility] services as a benefit of her Medicare plan. Patient is eligible for [place]. Medical record notes and inpatient care manager reveals patient's discharge plan is for a skilled nursing facility disposition. If patient's post hospital needs change please place a [facility] Care Management consult. For questions please contact:   Natividad Brood, RN BSN [facility] Liaison  [phone] business mobile phone Toll free office [phone]

## 2016-01-18 NOTE — Consult Note (Addendum)
Cardiology Consult    Patient ID: [name] MRN: [id number], DOB/AGE: October 30, 1924   Admit date: 01/11/2016 Date of Consult: 01/18/2016  Primary Physician: [name], MD Primary Cardiologist: Dr. Claiborne Billings Requesting Provider: Dr. Lavella Lemons Internal Medicine  Patient Profile    82 yo female with PMH of CAD/chronic diastolic CHF/DM II/GERD/Hypothyroidism/HTN who presented to the [facility] ED on 01/11/16 and admitted with sepsis/PNA, and developed epigastric pain, with CT abd showing SBO. Plans for possible surgery.   Past Medical History   Past Medical History  Diagnosis Date  . Asthma   . Coronary artery disease   . Diabetes mellitus without complication ([place])   . Hypothyroidism   . GERD (gastroesophageal reflux disease)   . Insomnia   . Anxiety   . Hypertension   . CAD (coronary artery disease)   . Osteopenia   . Osteoarthritis   . H/O echocardiogram 06-26-2012    EF 55%  . H/O cardiovascular stress test 06-26-2012    compared to previous study there is no significant change; normal myocardial perfusion study. no significant ischemia demonstrated . this is a low risk scan  . History of cardiac cath 09-30-1998    cath done by Dr. Claiborne Billings, successful rotation atherectomy of the left anterior decending artert with reduction from 80 to105  . Hx of cardiac cath 09-26-1998    dign[facility] cath dr. Claiborne Billings to do rotoblator at a later date  . History of Doppler ultrasound 03-30-2008    neg abd. doppler for aneurysm  . History of Holter monitoring 12-22-2007    NSR with pac occs. pvc's    Past Surgical History  Procedure Laterality Date  . Appendectomy  1947  . Dilation and curettage of uterus  1960  . Vesicovaginal fistula closure w/ tah  1971    Dr Mallie Mussel  . Bladder tack  1970    Dr Rosana Hoes  . Breast lumpectomy  1970    Left, Dr Leandrew Koyanagi  . Nasal sinus surgery  1980    Left; for chronic sinusitis  . Revision total knee arthroplasty  2005    Dr Alvan Dame Right knee     Allergies  No Known  Allergies  History of Present Illness    82 yo female patient of Dr. Evette Georges with PMH of CAD (s/p high speed rotational atherectomy of the LAD)/chronic diastolic CHF/DM II/GERD/Hypothyroidism/HTN who was last seen in the office on 10/2015. At that time she was in her normal state of health, her lasix was reduced to 20mg  every other day given her blood pressure was slightly low, and lab showed slight dehydration. Other medications remained the same. A repeat 2D echo was ordered given the last one was in 2013 and showed normal EF of 55%, but noted history of moderate aortic sclerosis without stenosis and mild mitral/tricuspid regurg. Repeat 2D echo on 11/08/2015 showed an EF 30-35% with no change in the valve anatomy?    States she was dx initially with PNA about 5 months ago, and was discharged to [facility] for rehab. She was admitted on 01/11/2016 from home with the diagnosis of sepsis secondary to LLL CAP after failing outpatient azithromycin to Internal Medicine service. During this admission she also complained of epigastric and RLQ pain that CT showed high grade distal small bowel obstruction that is thought to be secondary to adhesions. NG tube was placed on 01/12/2016 and surgery has been following the patient. Improvement has waxed and waned, but surgery saw her this morning and plans  for possible surgical intervention.   In talking with the patient she reports using a walker to ambulate around her home. Does not have chest pain or dyspnea with this activity normally. Also denies any syncope/presyncope in the past. Currently lives with her son and niece at home. Cardiology has been consulted for pre-op clearance in the case surgery is needed.   Inpatient Medications    . arformoterol  15 mcg Nebulization BID   And  . umeclidinium bromide  1 puff Inhalation Daily  . aspirin EC  81 mg Oral QHS  . azelastine  1 spray Each Nare BID  . clorazepate  15 mg Oral QHS  . ezetimibe  10 mg Oral Daily    . feeding supplement  1 Container Oral TID BM  . furosemide  20 mg Oral QODAY  . guaiFENesin  600 mg Oral BID  . heparin subcutaneous  5,000 Units Subcutaneous Q8H  . insulin aspart  0-9 Units Subcutaneous TID WC  . levothyroxine  25 mcg Oral QAC breakfast  . metoprolol succinate  100 mg Oral Daily  . montelukast  10 mg Oral QHS  . pantoprazole  40 mg Oral Daily  . polyethylene glycol  17 g Oral Daily  . QUEtiapine  50 mg Oral Q breakfast  . rosuvastatin  20 mg Oral QHS  . sodium chloride flush  3 mL Intravenous Q12H  . topiramate  25 mg Oral QHS    Family History    Family History  Problem Relation Age of Onset  . Diabetes Father   . Parkinson's disease Father   . Heart disease Father   . Asthma Father   . Diabetes Mother   . Heart disease Mother   . Heart disease Brother   . Diabetes Brother   . Diabetes Brother   . Diabetes Brother     Social History    Social History   Social History  . Marital Status: Widowed    Spouse Name: N/A  . Number of Children: N/A  . Years of Education: N/A   Occupational History  . Not on file.   Social History Main Topics  . Smoking status: Never Smoker   . Smokeless tobacco: Never Used  . Alcohol Use: No  . Drug Use: No  . Sexual Activity: Not on file   Other Topics Concern  . Not on file   Social History Narrative     Review of Systems    General:  No chills, fever, night sweats or weight changes.  Cardiovascular:  No chest pain, dyspnea on exertion, edema, orthopnea, palpitations, paroxysmal nocturnal dyspnea. Dermatological: No rash, lesions/masses Respiratory: No cough, + dyspnea Urologic: No hematuria, dysuria Abdominal:   No nausea, vomiting, diarrhea, bright red blood per rectum, melena, or hematemesis Neurologic:  No visual changes,+  wkns, changes in mental status. All other systems reviewed and are otherwise negative except as noted above.  Physical Exam    Blood pressure 121/49, pulse 96, temperature  98.9 F (37.2 C), temperature source Oral, resp. rate 18, height 5\' 1"  (1.549 m), weight 181 lb 8 oz (82.328 kg), SpO2 95 %.  General: Pleasant older female, NAD Psych: Normal affect. Neuro: Alert and oriented X 3. Moves all extremities spontaneously. HEENT: Normal  Neck: Supple,  no JVD. Lungs:  Resp regular and unlabored, Diminished with slight expiratory wheeze. Heart: RRR no s3, s4, or murmurs. Abdomen: Soft, non-tender, distended, BS hypoactive + x 4.  Extremities: No clubbing, cyanosis, trace edema. DP/PT/Radials 2+  and equal bilaterally.  Labs    Troponin (Point of Care Test) No results for input(s): TROPIPOC in the last 72 hours. No results for input(s): CKTOTAL, CKMB, TROPONINI in the last 72 hours. Lab Results  Component Value Date   WBC 8.2 01/16/2016   HGB 9.0* 01/16/2016   HCT 30.0* 01/16/2016   MCV 81.7 01/16/2016   PLT 273 01/16/2016    Recent Labs Lab 01/12/16 0248  01/18/16 0523  NA 140  < > 140  K 2.7*  < > 3.8  CL 107  < > 108  CO2 22  < > 26  BUN 74*  < > 7  CREATININE 1.49*  < > 0.79  CALCIUM 8.3*  < > 8.8*  PROT 5.8*  --   --   BILITOT 0.5  --   --   ALKPHOS 41  --   --   ALT 27  --   --   AST 27  --   --   GLUCOSE 177*  < > 142*  < > = values in this interval not displayed. No results found for: CHOL, HDL, LDLCALC, TRIG Lab Results  Component Value Date   DDIMER 1.66* 08/04/2013     Radiology Studies    Ct Abdomen Pelvis Wo Contrast  01/12/2016  CLINICAL DATA:  Diffuse abdominal pain, nausea, and vomiting beginning today Hx: CAD, Diabetes, HTN, Cardiac cath, appendectomy EXAM: CT ABDOMEN AND PELVIS WITHOUT CONTRAST TECHNIQUE: Multidetector CT imaging of the abdomen and pelvis was performed following the standard protocol without IV contrast. COMPARISON:  03/13/2013 FINDINGS: Lower chest: Bilateral pleural effusions are present. Bibasilar atelectasis right greater than left. Small pericardial effusion is 7 mm. Aortic valve calcification.  Coronary artery calcifications. Upper abdomen: No focal mass identified within the liver or adrenal glands. The pancreas is atrophic without focal mass. And oval low-density mass is identified within the medial aspect of spleen, measuring 11 mm. This cannot be further characterized. Small bilateral renal masses are present. No hydronephrosis. Gallbladder is present. Gastrointestinal tract: Distal esophagus is distended by fluid. The stomach is markedly distended with fluid and air. Small bowel loops are markedly distended. Fecal type material is identified within the distal dilated loops. The most distal ileal loops are not dilated and are entirely collapsed. Colonic loops are collapsed. There are scattered diverticula. No acute diverticulitis. The transition zone is within the right lower quadrant. A small bowel loop abruptly tapers to normal caliber and is best seen on image 57 of series 204, a sagittal root projection. Pelvis: The uterus is absent. No free pelvic fluid. Urinary bladder is distended. Retroperitoneum: There is dense atherosclerotic calcification of the abdominal aorta. Abdominal wall: Unremarkable. Osseous structures: Unremarkable. IMPRESSION: 1. High-grade distal small-bowel obstruction likely related to adhesions. 2. Diverticulosis without acute diverticulitis. 3. Bilateral pleural effusions. 4. Bibasilar atelectasis. 5. Small pericardial effusion. 6. Distal esophagus filled with fluid, likely related to the obstruction. 7. Status post hysterectomy. 8. Small low-attenuation lesion within the spleen, favored to be benign but not fully characterized. Electronically Signed   By: Nolon Nations M.D.   On: 01/12/2016 12:31   Dg Chest 2 View  01/16/2016  CLINICAL DATA:  Pneumonia EXAM: CHEST  2 VIEW COMPARISON:  01/12/2016 FINDINGS: Cardiac shadow is within normal limits. The lungs are well aerated bilaterally. Small bilateral pleural effusions are seen. No acute bony abnormality is noted.  IMPRESSION: No active cardiopulmonary disease. Electronically Signed   By: Inez Catalina M.D.   On: 01/16/2016 07:38  Dg Abd 2 Views  01/18/2016  CLINICAL DATA:  Followup for bowel obstruction. EXAM: ABDOMEN - 2 VIEW COMPARISON:  01/14/2016 FINDINGS: There are distended loops of small bowel with multiple air-fluid levels. Small bowel dilation appears increased when compared to the most recent prior exam. Air and contrast is seen within normal caliber colon. There is no free air. IMPRESSION: 1. Findings consistent with a partial small bowel obstruction. The degree of small bowel dilation has increased since the prior exam. No free air. Electronically Signed   By: Lajean Manes M.D.   On: 01/18/2016 11:23    ECG & Cardiac Imaging    ECHO: 11/08/2015  Study Conclusions  - Left ventricle: The cavity size was normal. Wall thickness was  normal. Systolic function was moderately to severely reduced. The  estimated ejection fraction was in the range of 30% to 35%.  Doppler parameters are consistent with abnormal left ventricular  relaxation (grade 1 diastolic dysfunction).  - Pulmonary arteries: Systolic pressure was mildly increased. PA  peak pressure: 35 mm Hg (S).  EKG: Know LBBB, Rate in the 80s on telemetry  Assessment & Plan    1. Chronic diastolic CHF: Last 2D echo in 10/2015 reported an EF of 35% but in further review with Dr. Stanford Breed it appears to be around 55-55%. Currently on 20mg  PO lasix every other day, with a stable Cr of 0.79. --appears euvolemic on exam, no crackles noted, and trace edema which she reports is her normal at home.  --Weight is stable, down from 188>>181 on admission  2. SBO: Currently being followed by surgery with possibility of surgical intervention. NG tube no longer in place. Repeat abd x-ray show partial SBO with no free air today.   3. Elevated Troponin: Enzymes were cycled on admission with minimal elevation and flat trend. Likely elevated related to the dx  of sepsis on admission. She denies ever having had any chest pain.  --EKG with known hx of LBBB   Given her age along with history of CAD and chronic diastolic CHF I would say that she is at higher risk of complications with surgical intervention. With that said, she reports no chest pain or dyspnea on daily basis with ambulation at home with her walker. Also, does not appear to be volume overloaded at this time, and stable with current lasix dose. Do have room to increase lasix dose, given her Cr is stable at 0.79. If decision is made to proceed with surgery, would recommend close intake and output monitoring in the case she is at risk to become hypervolemic. Would say that she is appropriate surgical candidate at this time.   --Dr. Stanford Breed to see patient.  Barnet Pall, NP-C Pager [phone] 01/18/2016, 3:10 PM As above, patient seen and examined. Briefly she is an 82 year old female with past medical history of coronary artery disease, diastolic congestive heart failure, diabetes mellitus, hypertension, hypothyroidism for preoperative evaluation prior to abdominal surgery for small bowel obstruction. She has had previous PCI of her LAD. Nuclear study November 2013 showed no scar or ischemia. Echocardiogram repeated March 2017 and was interpreted as ejection fraction 30-35%. I have reviewed these images and feel it is likely 50-55%. Patient admitted with pneumonia and small bowel obstruction. She will require surgery and cardiology asked to evaluate preoperatively. Electrocardiogram shows sinus with left bundle branch block. Note prior to recent pneumonia patient ambulated slowly without walker but did not have significant dyspnea on exertion or chest pain. Note patient's troponin of 0.05  and 0.05 is not consistent with an acute coronary syndrome.  Her risk for surgery will be at least moderately increased because of age and history of coronary disease. However, her surgery cannot be  delayed for further cardiac evaluation (If she were found to have ischemia and required PCI this would require dual antiplatelet therapy which would not be possible given need for surgery). She has also not had recent increased dyspnea or chest pain. I have reviewed her recent echocardiogram and feel her ejection fraction is low normal. Would proceed without further ischemia evaluation realizing there is some increased risk. This was discussed with patient and family. Continue aspirin, statin and metoprolol. Resume Plavix postoperatively when okay with surgery. Watch volume status postoperatively and continue present dose of Lasix. Kirk Ruths

## 2016-01-18 NOTE — Care Management Important Message (Signed)
Important Message  Patient Details  Name: [name] MRN: [id number] Date of Birth: 05/08/1925   Medicare Important Message Given:  Yes    Loann Quill 01/18/2016, 9:59 AM

## 2016-01-19 ENCOUNTER — Inpatient Hospital Stay (HOSPITAL_COMMUNITY): Payer: Medicare Other | Admitting: Anesthesiology

## 2016-01-19 ENCOUNTER — Inpatient Hospital Stay (HOSPITAL_COMMUNITY): Payer: Medicare Other

## 2016-01-19 ENCOUNTER — Encounter (HOSPITAL_COMMUNITY)
Admission: EM | Disposition: A | Discharge status: Skilled Nursing Facility | Payer: Self-pay | Source: Home / Self Care | Attending: Internal Medicine

## 2016-01-19 ENCOUNTER — Encounter (HOSPITAL_COMMUNITY): Payer: Self-pay | Admitting: Anesthesiology

## 2016-01-19 DIAGNOSIS — J189 Pneumonia, unspecified organism: Secondary | ICD-10-CM

## 2016-01-19 DIAGNOSIS — A419 Sepsis, unspecified organism: Principal | ICD-10-CM

## 2016-01-19 HISTORY — PX: LYSIS OF ADHESION: SHX5961

## 2016-01-19 HISTORY — PX: LAPAROSCOPY: SHX197

## 2016-01-19 LAB — DIFFERENTIAL
Basophils Absolute: 0 10*3/uL (ref 0.0–0.1)
Basophils Relative: 1 %
EOS PCT: 4 %
Eosinophils Absolute: 0.4 10*3/uL (ref 0.0–0.7)
LYMPHS ABS: 1.4 10*3/uL (ref 0.7–4.0)
LYMPHS PCT: 16 %
Monocytes Absolute: 0.9 10*3/uL (ref 0.1–1.0)
Monocytes Relative: 10 %
NEUTROS PCT: 69 %
Neutro Abs: 6 10*3/uL (ref 1.7–7.7)

## 2016-01-19 LAB — SURGICAL PCR SCREEN
MRSA, PCR: NEGATIVE
STAPHYLOCOCCUS AUREUS: NEGATIVE

## 2016-01-19 LAB — COMPREHENSIVE METABOLIC PANEL
ALK PHOS: 48 U/L (ref 38–126)
ALT: 49 U/L (ref 14–54)
AST: 39 U/L (ref 15–41)
Albumin: 2.2 g/dL — ABNORMAL LOW (ref 3.5–5.0)
Anion gap: 7 (ref 5–15)
BILIRUBIN TOTAL: 0.1 mg/dL — AB (ref 0.3–1.2)
BUN: 7 mg/dL (ref 6–20)
CALCIUM: 8.8 mg/dL — AB (ref 8.9–10.3)
CHLORIDE: 107 mmol/L (ref 101–111)
CO2: 26 mmol/L (ref 22–32)
CREATININE: 0.79 mg/dL (ref 0.44–1.00)
GFR calc Af Amer: >60 mL/min (ref >60)
Glucose, Bld: 132 mg/dL — ABNORMAL HIGH (ref 65–99)
Potassium: 4 mmol/L (ref 3.5–5.1)
Sodium: 140 mmol/L (ref 135–145)
Total Protein: 5.3 g/dL — ABNORMAL LOW (ref 6.5–8.1)

## 2016-01-19 LAB — GLUCOSE, CAPILLARY
GLUCOSE-CAPILLARY: 130 mg/dL — AB (ref 65–99)
GLUCOSE-CAPILLARY: 168 mg/dL — AB (ref 65–99)
Glucose-Capillary: 116 mg/dL — ABNORMAL HIGH (ref 65–99)
Glucose-Capillary: 118 mg/dL — ABNORMAL HIGH (ref 65–99)
Glucose-Capillary: 119 mg/dL — ABNORMAL HIGH (ref 65–99)
Glucose-Capillary: 136 mg/dL — ABNORMAL HIGH (ref 65–99)
Glucose-Capillary: 136 mg/dL — ABNORMAL HIGH (ref 65–99)
Glucose-Capillary: 157 mg/dL — ABNORMAL HIGH (ref 65–99)

## 2016-01-19 LAB — TRIGLYCERIDES: Triglycerides: 173 mg/dL — ABNORMAL HIGH (ref <150)

## 2016-01-19 LAB — CBC
HCT: 31.9 % — ABNORMAL LOW (ref 36.0–46.0)
Hemoglobin: 9.5 g/dL — ABNORMAL LOW (ref 12.0–15.0)
MCH: 24.4 pg — ABNORMAL LOW (ref 26.0–34.0)
MCHC: 29.8 g/dL — ABNORMAL LOW (ref 30.0–36.0)
MCV: 81.8 fL (ref 78.0–100.0)
PLATELETS: 255 10*3/uL (ref 150–400)
RBC: 3.9 MIL/uL (ref 3.87–5.11)
RDW: 14.5 % (ref 11.5–15.5)
WBC: 8.7 10*3/uL (ref 4.0–10.5)

## 2016-01-19 LAB — MAGNESIUM: Magnesium: 1.8 mg/dL (ref 1.7–2.4)

## 2016-01-19 LAB — PHOSPHORUS: Phosphorus: 4.2 mg/dL (ref 2.5–4.6)

## 2016-01-19 LAB — TYPE AND SCREEN
ABO/RH(D): A POS
ANTIBODY SCREEN: NEGATIVE

## 2016-01-19 LAB — PREALBUMIN: PREALBUMIN: 14.7 mg/dL — AB (ref 18–38)

## 2016-01-19 LAB — ABO/RH: ABO/RH(D): A POS

## 2016-01-19 SURGERY — LAPAROSCOPY, DIAGNOSTIC
Anesthesia: General | Site: Abdomen

## 2016-01-19 MED ORDER — METOPROLOL TARTRATE 5 MG/5ML IV SOLN
5.0000 mg | Freq: Four times a day (QID) | INTRAVENOUS | Status: DC
  Administered 2016-01-20 – 2016-01-28 (×33): 5 mg via INTRAVENOUS
  Filled 2016-01-19 (×33): qty 5

## 2016-01-19 MED ORDER — TRACE MINERALS CR-CU-MN-SE-ZN 10-1000-500-60 MCG/ML IV SOLN
INTRAVENOUS | Status: AC
  Administered 2016-01-19: 22:00:00 via INTRAVENOUS
  Filled 2016-01-19: qty 960

## 2016-01-19 MED ORDER — SODIUM CHLORIDE 0.9 % IR SOLN
Status: DC | PRN
  Administered 2016-01-19: 1000 mL

## 2016-01-19 MED ORDER — ONDANSETRON HCL 4 MG/2ML IJ SOLN: 4.0000 mg | Freq: Once | INTRAMUSCULAR | Status: DC | PRN

## 2016-01-19 MED ORDER — LIDOCAINE HCL 1 % IJ SOLN
INTRAMUSCULAR | Status: DC | PRN
  Administered 2016-01-19: 4 mL via INTRADERMAL

## 2016-01-19 MED ORDER — ACETAMINOPHEN 10 MG/ML IV SOLN
1000.0000 mg | Freq: Four times a day (QID) | INTRAVENOUS | Status: AC
  Administered 2016-01-20 (×3): 1000 mg via INTRAVENOUS
  Filled 2016-01-19 (×4): qty 100

## 2016-01-19 MED ORDER — MAGNESIUM SULFATE IN D5W 1-5 GM/100ML-% IV SOLN
1.0000 g | Freq: Once | INTRAVENOUS | Status: AC
Start: 2016-01-19 — End: 2016-01-20
  Administered 2016-01-19: 1 g via INTRAVENOUS
  Filled 2016-01-19 (×2): qty 100

## 2016-01-19 MED ORDER — ROCURONIUM BROMIDE 50 MG/5ML IV SOLN
INTRAVENOUS | Status: AC
  Filled 2016-01-19: qty 1

## 2016-01-19 MED ORDER — METOPROLOL TARTRATE 5 MG/5ML IV SOLN: 5.0000 mg | Freq: Four times a day (QID) | INTRAVENOUS | Status: DC

## 2016-01-19 MED ORDER — 0.9 % SODIUM CHLORIDE (POUR BTL) OPTIME
TOPICAL | Status: DC | PRN
  Administered 2016-01-19: 1000 mL

## 2016-01-19 MED ORDER — FENTANYL CITRATE (PF) 100 MCG/2ML IJ SOLN
25.0000 ug | INTRAMUSCULAR | Status: DC | PRN
  Administered 2016-01-19 (×4): 25 ug via INTRAVENOUS

## 2016-01-19 MED ORDER — ASPIRIN 300 MG RE SUPP
300.0000 mg | Freq: Every day | RECTAL | Status: DC
  Administered 2016-01-19 – 2016-01-25 (×7): 300 mg via RECTAL
  Filled 2016-01-19 (×7): qty 1

## 2016-01-19 MED ORDER — BUPIVACAINE-EPINEPHRINE (PF) 0.25% -1:200000 IJ SOLN
INTRAMUSCULAR | Status: AC
  Filled 2016-01-19: qty 30

## 2016-01-19 MED ORDER — ONDANSETRON HCL 4 MG/2ML IJ SOLN
INTRAMUSCULAR | Status: AC
  Filled 2016-01-19: qty 2

## 2016-01-19 MED ORDER — FAT EMULSION 20 % IV EMUL
240.0000 mL | INTRAVENOUS | Status: AC
  Administered 2016-01-19: 240 mL via INTRAVENOUS
  Filled 2016-01-19: qty 250

## 2016-01-19 MED ORDER — DEXTROSE 5 % IV SOLN
2.0000 g | INTRAVENOUS | Status: AC
  Administered 2016-01-19: 2 g via INTRAVENOUS
  Filled 2016-01-19: qty 2

## 2016-01-19 MED ORDER — PROPOFOL 10 MG/ML IV BOLUS
INTRAVENOUS | Status: AC
  Filled 2016-01-19: qty 20

## 2016-01-19 MED ORDER — SODIUM CHLORIDE 0.9% FLUSH
10.0000 mL | Freq: Two times a day (BID) | INTRAVENOUS | Status: DC
  Administered 2016-01-19: 10 mL
  Administered 2016-01-20 (×2): 20 mL
  Administered 2016-01-22 – 2016-01-24 (×5): 10 mL
  Administered 2016-01-25 (×2): 20 mL
  Administered 2016-01-26 – 2016-02-01 (×8): 10 mL

## 2016-01-19 MED ORDER — ONDANSETRON HCL 4 MG/2ML IJ SOLN
INTRAMUSCULAR | Status: DC | PRN
  Administered 2016-01-19: 4 mg via INTRAVENOUS

## 2016-01-19 MED ORDER — FENTANYL CITRATE (PF) 100 MCG/2ML IJ SOLN
INTRAMUSCULAR | Status: DC | PRN
  Administered 2016-01-19: 50 ug via INTRAVENOUS
  Administered 2016-01-19: 100 ug via INTRAVENOUS
  Administered 2016-01-19 (×2): 50 ug via INTRAVENOUS

## 2016-01-19 MED ORDER — FUROSEMIDE 10 MG/ML IJ SOLN
20.0000 mg | INTRAMUSCULAR | Status: DC
  Administered 2016-01-21 – 2016-01-25 (×3): 20 mg via INTRAVENOUS
  Filled 2016-01-19 (×3): qty 2

## 2016-01-19 MED ORDER — ROCURONIUM BROMIDE 100 MG/10ML IV SOLN
INTRAVENOUS | Status: DC | PRN
  Administered 2016-01-19: 50 mg via INTRAVENOUS

## 2016-01-19 MED ORDER — SUCCINYLCHOLINE CHLORIDE 20 MG/ML IJ SOLN
INTRAMUSCULAR | Status: DC | PRN
  Administered 2016-01-19: 100 mg via INTRAVENOUS

## 2016-01-19 MED ORDER — LORAZEPAM 2 MG/ML IJ SOLN
0.5000 mg | Freq: Three times a day (TID) | INTRAMUSCULAR | Status: DC | PRN
Start: 2016-01-19 — End: 2016-02-02
  Filled 2016-01-19: qty 1

## 2016-01-19 MED ORDER — GLYCOPYRROLATE 0.2 MG/ML IV SOSY
PREFILLED_SYRINGE | INTRAVENOUS | Status: AC
  Filled 2016-01-19: qty 3

## 2016-01-19 MED ORDER — MORPHINE SULFATE (PF) 2 MG/ML IV SOLN
1.0000 mg | INTRAVENOUS | Status: DC | PRN
  Administered 2016-01-19 – 2016-01-29 (×26): 2 mg via INTRAVENOUS
  Filled 2016-01-19 (×26): qty 1

## 2016-01-19 MED ORDER — INSULIN ASPART 100 UNIT/ML ~~LOC~~ SOLN
0.0000 [IU] | SUBCUTANEOUS | Status: DC
  Administered 2016-01-20 (×3): 2 [IU] via SUBCUTANEOUS

## 2016-01-19 MED ORDER — CEFOTETAN DISODIUM-DEXTROSE 2-2.08 GM-% IV SOLR
INTRAVENOUS | Status: AC
  Filled 2016-01-19: qty 50

## 2016-01-19 MED ORDER — SODIUM CHLORIDE 0.9% FLUSH
10.0000 mL | INTRAVENOUS | Status: DC | PRN
  Administered 2016-01-19 – 2016-01-31 (×3): 10 mL
  Filled 2016-01-19 (×3): qty 40

## 2016-01-19 MED ORDER — ALBUMIN HUMAN 5 % IV SOLN
INTRAVENOUS | Status: DC | PRN
  Administered 2016-01-19: 15:00:00 via INTRAVENOUS

## 2016-01-19 MED ORDER — SUGAMMADEX SODIUM 200 MG/2ML IV SOLN
INTRAVENOUS | Status: AC
  Filled 2016-01-19: qty 2

## 2016-01-19 MED ORDER — LACTATED RINGERS IV SOLN
INTRAVENOUS | Status: DC
  Administered 2016-01-19: 14:00:00 via INTRAVENOUS

## 2016-01-19 MED ORDER — LIDOCAINE 2% (20 MG/ML) 5 ML SYRINGE
INTRAMUSCULAR | Status: AC
  Filled 2016-01-19: qty 5

## 2016-01-19 MED ORDER — HEPARIN SODIUM (PORCINE) 5000 UNIT/ML IJ SOLN
5000.0000 [IU] | Freq: Three times a day (TID) | INTRAMUSCULAR | Status: DC
Start: 2016-01-20 — End: 2016-02-02
  Administered 2016-01-20 – 2016-02-02 (×40): 5000 [IU] via SUBCUTANEOUS
  Filled 2016-01-19 (×38): qty 1

## 2016-01-19 MED ORDER — DEXTROSE 5 % IV SOLN
10.0000 mg | INTRAVENOUS | Status: DC | PRN
  Administered 2016-01-19: 20 ug/min via INTRAVENOUS

## 2016-01-19 MED ORDER — PANTOPRAZOLE SODIUM 40 MG IV SOLR
40.0000 mg | INTRAVENOUS | Status: DC
  Administered 2016-01-20 – 2016-01-21 (×2): 40 mg via INTRAVENOUS
  Filled 2016-01-19 (×3): qty 40

## 2016-01-19 MED ORDER — LACTATED RINGERS IV SOLN
INTRAVENOUS | Status: DC | PRN
  Administered 2016-01-19 (×2): via INTRAVENOUS

## 2016-01-19 MED ORDER — LEVOTHYROXINE SODIUM 100 MCG IV SOLR
12.5000 ug | Freq: Every day | INTRAVENOUS | Status: DC
  Administered 2016-01-20 – 2016-01-25 (×6): 12.5 ug via INTRAVENOUS
  Filled 2016-01-19 (×6): qty 5

## 2016-01-19 MED ORDER — SUGAMMADEX SODIUM 200 MG/2ML IV SOLN
INTRAVENOUS | Status: DC | PRN
  Administered 2016-01-19: 200 mg via INTRAVENOUS

## 2016-01-19 MED ORDER — FENTANYL CITRATE (PF) 250 MCG/5ML IJ SOLN
INTRAMUSCULAR | Status: AC
  Filled 2016-01-19: qty 5

## 2016-01-19 MED ORDER — LORAZEPAM 2 MG/ML IJ SOLN: 0.5000 mg | Freq: Four times a day (QID) | INTRAMUSCULAR | Status: DC | PRN

## 2016-01-19 MED ORDER — LIDOCAINE HCL (CARDIAC) 20 MG/ML IV SOLN
INTRAVENOUS | Status: DC | PRN
  Administered 2016-01-19: 60 mg via INTRAVENOUS

## 2016-01-19 MED ORDER — MORPHINE SULFATE (PF) 2 MG/ML IV SOLN
INTRAVENOUS | Status: AC
  Filled 2016-01-19: qty 1

## 2016-01-19 MED ORDER — FENTANYL CITRATE (PF) 100 MCG/2ML IJ SOLN
INTRAMUSCULAR | Status: AC
  Administered 2016-01-19: 25 ug via INTRAVENOUS
  Filled 2016-01-19: qty 2

## 2016-01-19 MED ORDER — PROPOFOL 10 MG/ML IV BOLUS
INTRAVENOUS | Status: DC | PRN
  Administered 2016-01-19: 100 mg via INTRAVENOUS

## 2016-01-19 MED ORDER — NEOSTIGMINE METHYLSULFATE 5 MG/5ML IV SOSY
PREFILLED_SYRINGE | INTRAVENOUS | Status: AC
  Filled 2016-01-19: qty 5

## 2016-01-19 MED ORDER — CHLORHEXIDINE GLUCONATE 4 % EX LIQD
CUTANEOUS | Status: AC
  Administered 2016-01-19: 13:00:00
  Filled 2016-01-19: qty 30

## 2016-01-19 SURGICAL SUPPLY — 63 items
BLADE SURG ROTATE 9660 (MISCELLANEOUS) IMPLANT
CANISTER SUCTION 2500CC (MISCELLANEOUS) ×4 IMPLANT
CHLORAPREP W/TINT 26ML (MISCELLANEOUS) ×4 IMPLANT
COVER SURGICAL LIGHT HANDLE (MISCELLANEOUS) ×4 IMPLANT
DECANTER SPIKE VIAL GLASS SM (MISCELLANEOUS) ×8 IMPLANT
DRAPE LAPAROSCOPIC ABDOMINAL (DRAPES) ×4 IMPLANT
DRAPE UTILITY XL STRL (DRAPES) ×8 IMPLANT
DRAPE WARM FLUID 44X44 (DRAPE) ×4 IMPLANT
DRSG OPSITE 11X17.75 LRG (GAUZE/BANDAGES/DRESSINGS) ×4 IMPLANT
DRSG OPSITE POSTOP 4X10 (GAUZE/BANDAGES/DRESSINGS) IMPLANT
DRSG OPSITE POSTOP 4X8 (GAUZE/BANDAGES/DRESSINGS) IMPLANT
ELECT BLADE 6.5 EXT (BLADE) IMPLANT
ELECT CAUTERY BLADE 6.4 (BLADE) ×8 IMPLANT
ELECT REM PT RETURN 9FT ADLT (ELECTROSURGICAL) ×4
ELECTRODE REM PT RTRN 9FT ADLT (ELECTROSURGICAL) ×2 IMPLANT
GAUZE SPONGE 4X4 12PLY STRL (GAUZE/BANDAGES/DRESSINGS) ×4 IMPLANT
GLOVE BIO SURGEON STRL SZ7.5 (GLOVE) ×4 IMPLANT
GLOVE BIOGEL M STRL SZ7.5 (GLOVE) ×8 IMPLANT
GLOVE BIOGEL PI IND STRL 7.0 (GLOVE) ×2 IMPLANT
GLOVE BIOGEL PI IND STRL 7.5 (GLOVE) ×2 IMPLANT
GLOVE BIOGEL PI IND STRL 8 (GLOVE) ×2 IMPLANT
GLOVE BIOGEL PI INDICATOR 7.0 (GLOVE) ×2
GLOVE BIOGEL PI INDICATOR 7.5 (GLOVE) ×2
GLOVE BIOGEL PI INDICATOR 8 (GLOVE) ×2
GLOVE SURG SS PI 6.5 STRL IVOR (GLOVE) ×4 IMPLANT
GLOVE SURG SS PI 7.5 STRL IVOR (GLOVE) ×4 IMPLANT
GOWN STRL REUS W/ TWL LRG LVL3 (GOWN DISPOSABLE) ×8 IMPLANT
GOWN STRL REUS W/ TWL XL LVL3 (GOWN DISPOSABLE) ×10 IMPLANT
GOWN STRL REUS W/TWL LRG LVL3 (GOWN DISPOSABLE) ×8
GOWN STRL REUS W/TWL XL LVL3 (GOWN DISPOSABLE) ×10
KIT BASIN OR (CUSTOM PROCEDURE TRAY) ×4 IMPLANT
KIT ROOM TURNOVER OR (KITS) ×4 IMPLANT
LIGASURE IMPACT 36 18CM CVD LR (INSTRUMENTS) ×4 IMPLANT
LIQUID BAND (GAUZE/BANDAGES/DRESSINGS) ×4 IMPLANT
NS IRRIG 1000ML POUR BTL (IV SOLUTION) ×8 IMPLANT
PACK GENERAL/GYN (CUSTOM PROCEDURE TRAY) ×4 IMPLANT
PAD ARMBOARD 7.5X6 YLW CONV (MISCELLANEOUS) ×8 IMPLANT
SCALPEL HARMONIC ACE (MISCELLANEOUS) IMPLANT
SCISSORS LAP 5X35 DISP (ENDOMECHANICALS) IMPLANT
SET IRRIG TUBING LAPAROSCOPIC (IRRIGATION / IRRIGATOR) ×4 IMPLANT
SLEEVE ENDOPATH XCEL 5M (ENDOMECHANICALS) ×4 IMPLANT
SPECIMEN JAR LARGE (MISCELLANEOUS) ×4 IMPLANT
SPONGE LAP 18X18 X RAY DECT (DISPOSABLE) IMPLANT
STAPLER VISISTAT 35W (STAPLE) ×4 IMPLANT
SUCTION POOLE TIP (SUCTIONS) ×4 IMPLANT
SUT MNCRL AB 4-0 PS2 18 (SUTURE) ×8 IMPLANT
SUT PDS AB 1 TP1 96 (SUTURE) ×8 IMPLANT
SUT SILK 2 0 SH CR/8 (SUTURE) ×4 IMPLANT
SUT SILK 2 0 TIES 10X30 (SUTURE) ×4 IMPLANT
SUT SILK 3 0 SH CR/8 (SUTURE) ×4 IMPLANT
SUT SILK 3 0 TIES 10X30 (SUTURE) ×4 IMPLANT
SUT VIC AB 3-0 SH 18 (SUTURE) IMPLANT
TOWEL OR 17X24 6PK STRL BLUE (TOWEL DISPOSABLE) IMPLANT
TOWEL OR 17X26 10 PK STRL BLUE (TOWEL DISPOSABLE) ×4 IMPLANT
TRAY FOLEY CATH 14FRSI W/METER (CATHETERS) ×4 IMPLANT
TRAY FOLEY CATH 16FRSI W/METER (SET/KITS/TRAYS/PACK) IMPLANT
TRAY LAPAROSCOPIC MC (CUSTOM PROCEDURE TRAY) ×4 IMPLANT
TROCAR XCEL BLUNT TIP 100MML (ENDOMECHANICALS) ×4 IMPLANT
TROCAR XCEL NON-BLD 11X100MML (ENDOMECHANICALS) IMPLANT
TROCAR XCEL NON-BLD 5MMX100MML (ENDOMECHANICALS) ×4 IMPLANT
TUBING INSUFFLATION (TUBING) ×4 IMPLANT
WATER STERILE IRR 1000ML POUR (IV SOLUTION) IMPLANT
YANKAUER SUCT BULB TIP NO VENT (SUCTIONS) ×4 IMPLANT

## 2016-01-19 NOTE — Anesthesia Preprocedure Evaluation (Addendum)
Anesthesia Evaluation  Patient identified by MRN, date of birth, ID band Patient awake    Reviewed: Allergy & Precautions, NPO status , Patient's Chart, lab work & pertinent test results, reviewed documented beta blocker date and time   Airway Mallampati: III  TM Distance: >3 FB Neck ROM: Full    Dental  (+) Missing, Poor Dentition   Pulmonary asthma , pneumonia, unresolved,  Hx/o CAP LLL pneumonia this admission Has small bilateral pleural effusions on last CXR   Pulmonary exam normal breath sounds clear to auscultation       Cardiovascular hypertension, Pt. on medications and Pt. on home beta blockers + CAD  + dysrhythmias Atrial Fibrillation  Rhythm:Regular Rate:Tachycardia  EKG- Sinus Tachycardia with LBBB pattern  Hx/o Paroxysmal A. Fib with RVR converted to sinus now  Echo- [id number]  Systolic function was moderately to severely reduced. The  estimated ejection fraction was in the range of 30% to   35% later reviewed and felt to be 50-55%. Doppler parameters are consistent with abnormal left ventricular relaxation (grade 1 diastolic dysfunction). - Pulmonary arteries: Systolic pressure was mildly increased. PA peak pressure: 35 mm Hg (S).  Had PCI with rotablator of mid LAD lesion in 2000   Neuro/Psych Anxiety negative neurological ROS     GI/Hepatic Neg liver ROS, GERD  Medicated and Controlled,High grade distal Small bowel obstruction   Endo/Other  diabetes, Well Controlled, Type 2, Oral Hypoglycemic AgentsHypothyroidism Obesity Hyperlipidemia  Renal/GU Renal diseaseHx/o AKI with normal BUN/Cr now.  negative genitourinary   Musculoskeletal  (+) Arthritis ,   Abdominal (+) + obese,   Peds  Hematology  (+) anemia , Admitted with sepsis   Anesthesia Other Findings   Reproductive/Obstetrics                        Anesthesia Physical Anesthesia Plan  ASA: III  Anesthesia Plan:  General   Post-op Pain Management:    Induction: Intravenous, Rapid sequence and Cricoid pressure planned  Airway Management Planned: Oral ETT  Additional Equipment: Arterial line  Intra-op Plan:   Post-operative Plan: Extubation in OR and Possible Post-op intubation/ventilation  Informed Consent: I have reviewed the patients History and Physical, chart, labs and discussed the procedure including the risks, benefits and alternatives for the proposed anesthesia with the patient or authorized representative who has indicated his/her understanding and acceptance.   Dental advisory given  Plan Discussed with: CRNA, Anesthesiologist and Surgeon  Anesthesia Plan Comments:        Anesthesia Quick Evaluation

## 2016-01-19 NOTE — Anesthesia Procedure Notes (Signed)
Procedure Name: Intubation Date/Time: 01/19/2016 2:15 PM Performed by: Mariea Clonts Pre-anesthesia Checklist: Emergency Drugs available, Timeout performed, Patient identified, Patient being monitored and Suction available Patient Re-evaluated:Patient Re-evaluated prior to inductionOxygen Delivery Method: Circle system utilized Preoxygenation: Pre-oxygenation with 100% oxygen Intubation Type: IV induction Laryngoscope Size: Miller and 2 Grade View: Grade II Tube type: Oral Tube size: 7.5 mm Number of attempts: 1 Placement Confirmation: ETT inserted through vocal cords under direct vision,  breath sounds checked- equal and bilateral and positive ETCO2 Tube secured with: Tape Dental Injury: Teeth and Oropharynx as per pre-operative assessment

## 2016-01-19 NOTE — Progress Notes (Signed)
Subjective: Small bm but really bad abd pain overnight. +burping/belching  Objective: Vital signs in last 24 hours: Temp:  [98.9 F (37.2 C)-99.5 F (37.5 C)] 99.2 F (37.3 C) (06/01 0837) Pulse Rate:  [96-105] 98 (06/01 0425) Resp:  [18] 18 (06/01 0425) BP: (121-148)/(40-60) 148/55 mmHg (06/01 0837) SpO2:  [93 %-96 %] 96 % (06/01 0837) Weight:  [83.553 kg (184 lb 3.2 oz)] 83.553 kg (184 lb 3.2 oz) (06/01 0425) Last BM Date: 15-Feb-2016  Intake/Output from previous day: 15-Feb-2023 0701 - 06/01 0700 In: 240 [P.O.:240] Out: 1380 [Urine:1380] Intake/Output this shift: Total I/O In: 240 [P.O.:240] Out: -   Alert, not ill appearing Soft, distended. Mild TTP. No rebound/guarding  Lab Results:   Recent Labs  01/19/16 0452  WBC 8.7  HGB 9.5*  HCT 31.9*  PLT 255   BMET  Recent Labs  02/15/16 0523 01/19/16 0452  NA 140 140  K 3.8 4.0  CL 108 107  CO2 26 26  GLUCOSE 142* 132*  BUN 7 7  CREATININE 0.79 0.79  CALCIUM 8.8* 8.8*   PT/INR No results for input(s): LABPROT, INR in the last 72 hours. ABG No results for input(s): PHART, HCO3 in the last 72 hours.  Invalid input(s): PCO2, PO2  Studies/Results: Dg Abd 2 Views  2016-02-15  CLINICAL DATA:  Followup for bowel obstruction. EXAM: ABDOMEN - 2 VIEW COMPARISON:  01/14/2016 FINDINGS: There are distended loops of small bowel with multiple air-fluid levels. Small bowel dilation appears increased when compared to the most recent prior exam. Air and contrast is seen within normal caliber colon. There is no free air. IMPRESSION: 1. Findings consistent with a partial small bowel obstruction. The degree of small bowel dilation has increased since the prior exam. No free air. Electronically Signed   By: Lajean Manes M.D.   On: 15-Feb-2016 11:23   Dg Abd Portable 1v  01/19/2016  CLINICAL DATA:  Patient with abdominal pain for 5 days. EXAM: PORTABLE ABDOMEN - 1 VIEW COMPARISON:  , radiograph 2016-02-15. FINDINGS: Re- demonstrated  similar multiple gaseous distended loops of small bowel within the central abdomen. Small amount of contrast within the descending colon. Supine evaluation limited the detection of free intraperitoneal air. IMPRESSION: Findings most compatible with small bowel obstruction. Electronically Signed   By: Lovey Newcomer M.D.   On: 01/19/2016 08:21    Anti-infectives: Anti-infectives    Start     Dose/Rate Route Frequency Ordered Stop   01/12/16 0900  cefTRIAXone (ROCEPHIN) 1 g in dextrose 5 % 50 mL IVPB     1 g 100 mL/hr over 30 Minutes Intravenous Every 24 hours 01/11/16 1251 02/15/2016 1018   01/12/16 0900  azithromycin (ZITHROMAX) 500 mg in dextrose 5 % 250 mL IVPB     500 mg 250 mL/hr over 60 Minutes Intravenous Every 24 hours 01/11/16 1251 February 15, 2016 1048   01/11/16 0830  azithromycin (ZITHROMAX) 500 mg in dextrose 5 % 250 mL IVPB     500 mg 250 mL/hr over 60 Minutes Intravenous  Once 01/11/16 0820 01/11/16 0935   01/11/16 0830  cefTRIAXone (ROCEPHIN) 1 g in dextrose 5 % 50 mL IVPB     1 g 100 mL/hr over 30 Minutes Intravenous  Once 01/11/16 0820 01/11/16 0947      Assessment/Plan: pSBO-likely secondary to adhesions. although there is contrast in distal colon she has ongoing persistent dilated loops of SB with colicky abd pain. We are a week into this process with no significant improvement. Therefore I  have recommended proceeding to OR today for dx laparoscopy, possible laparotomy, possible resection. We re-discussed the risks/benefits of surgery vs no surgery.   I discussed the procedure in detail.    We discussed the risks and benefits of surgery including, but not limited to bleeding, infection (such as wound infection, abdominal abscess), injury to surrounding structures, blood clot formation, urinary retention, incisional hernia, possible anastomotic stricture, possible anastomotic leak, anesthesia risks, pulmonary & cardiac complications such as pneumonia &/or heart attack, need for  additional procedures, ileus, & prolonged hospitalization, renal failure, death.  We discussed the typical postoperative recovery course, including limitations & restrictions postoperatively and that if she does well she will need to go to rehab or nursing home.  She has agreed to proceed with surgery.   GI- npo, place PICC/TPN, expect ileus after surgery ID-RLL PNA Hx CAD, HTN, HLD-followed by Dr. Claiborne Billings; appreciated cards input.  VTE prophylaxis-scd, heparin Dispo-OR   Leighton Ruff. Redmond Pulling, MD, FACS General, Bariatric, & Minimally Invasive Surgery [facility], [place]   LOS: 8 days    Gayland Curry 01/19/2016

## 2016-01-19 NOTE — Transfer of Care (Signed)
Immediate Anesthesia Transfer of Care Note  Patient: [name]  Procedure(s) Performed: Procedure(s): LAPAROSCOPY DIAGNOSTIC (N/A) LYSIS OF ADHESION (N/A)  Patient Location: PACU  Anesthesia Type:General  Level of Consciousness: awake and alert   Airway & Oxygen Therapy: Patient Spontanous Breathing and Patient connected to nasal cannula oxygen  Post-op Assessment: Report given to RN and Post -op Vital signs reviewed and stable  Post vital signs: Reviewed and stable  Last Vitals:  Filed Vitals:   01/19/16 0837 01/19/16 1545  BP: 148/55 141/54  Pulse:  90  Temp: 37.3 C   Resp:  19    Last Pain:  Filed Vitals:   01/19/16 1547  PainSc: 0-No pain      Patients Stated Pain Goal: 0 ([id number])  Complications: No apparent anesthesia complications

## 2016-01-19 NOTE — Op Note (Signed)
01/11/2016 - 01/19/2016  3:29 PM  PATIENT:  [name]  82 y.o. female  PRE-OPERATIVE DIAGNOSIS:  Partial small bowel Obstruction  POST-OPERATIVE DIAGNOSIS:  same  PROCEDURE:  Procedure(s) Diagnostic laparoscopy converted to exploratory laparotomy LYSIS OF ADHESIONS  SURGEON:  Surgeon(s): Greer Pickerel, MD  ASSISTANTS: Coralie Keens, MD   ANESTHESIA:   general  DRAINS: Nasogastric Tube and Urinary Catheter (Foley)   LOCAL MEDICATIONS USED:  MARCAINE     SPECIMEN:  Source of Specimen:  mesenteric nodule  DISPOSITION OF SPECIMEN:  PATHOLOGY  COUNTS:  YES  INDICATION FOR PROCEDURE: This is a 82 year old female who was admitted on May 24 with a partial small bowel obstruction. She had a prior laparotomy many years ago. She also had an open appendectomy as well. She was started on her small bowel obstruction protocol. She ended up having contrast in her colon, her nasogastric tube was clamped and she was started on liquids. She tolerated the liquid silver nasogastric tube was removed. She started having flatus and bowel movements as well. However a few days ago we noticed that she was distended again. Repeat plain film imaging showed persistent dilated loops of small bowel but contrast is in her colon. After a week of nonoperative management her small bowel obstruction was not resolving. Therefore recommended exploratory surgery. I had a prolonged conversation with the patient and her family 2 days in a row. In my opinion her bowel obstruction was not going to resolve without surgery. Even though she was of advanced aged I thought this is her best chance of meaningful recovery. Please see my notes in the chart for additional details regarding the extensive conversation I had with the patient and her family regarding risk and benefits and postoperative recovery  PROCEDURE: After attaining informed consent she was taken to the OR 1 and placed supine on the operating table. General  endotracheal anesthesia was established. A Foley catheter was placed. Nasogastric tube was placed. Her abdomen was prepped and draped in the usual standard surgical fashion. She received prophylactic IV antibiotics. A surgical timeout was performed. Her lower midline incision was actually not in the midline was off to the left side. I decided to make a small supraumbilical incision with a #15 blade. The fascia was carefully incised. a finger sweep was performed and there was no immediate surrounding adhesions. A 0 Vicryl on the fascial edges and a 12 mm hasson trocar was placed and pneumoperitoneum was established with no change in vital signs. The laparoscope was advanced and the abdominal cavity was surveilled. Unfortunately all assault were numerous dilated loops of small bowel with really no working room. Therefore I decided to convert to an open procedure. The supraumbilical incision was extended infraumbilically down toward her pelvis. The subcutaneous tissue was divided. At this point Dr. Ninfa Linden joining the operating room. The fascia was carefully incised. some omental adhesions to the anterior abdominal wall were taken down. There are obvious numerous loops of dilated bowel. We eviscerated the small bowel and ran it distally. We found the point of obstruction. She had an adhesive band going over top of a loop of small bowel causing a obstruction. In this band was mesenteric nodule that appeared partially necrotic. There is no surrounding mesenteric lymphadenopathy. The nodule was released. Some interloop adhesions in this location were freed. There was no palpable mass in the area of the small bowel that had been strictured down by the adhesive band. Bowel contents were able to be milked past this  area blockage. Therefore I did not think it warranted resection. The bowel was returned to the abdomen. The fascia was closed with a running loop #1 PDS 2. Subcutaneous tissue was irrigated. Skin was  reapproximated with skin staples. A honeycomb dressing was applied. Needle, instrument, and sponge counts were correct 2. The patient was asked to maintain recovery room in stable condition.  PLAN OF CARE: pacu then to SDU  PATIENT DISPOSITION:  PACU - hemodynamically stable.   Delay start of Pharmacological VTE agent (>24hrs) due to surgical blood loss or risk of bleeding:  no  Leighton Ruff. Redmond Pulling, MD, FACS General, Bariatric, & Minimally Invasive Surgery [facility], [place]

## 2016-01-19 NOTE — Anesthesia Postprocedure Evaluation (Signed)
Anesthesia Post Note  Patient: [name]  Procedure(s) Performed: Procedure(s) (LRB): LAPAROSCOPY DIAGNOSTIC (N/A) LYSIS OF ADHESION (N/A)  Patient location during evaluation: PACU Anesthesia Type: General Level of consciousness: awake and alert Pain management: pain level controlled Vital Signs Assessment: post-procedure vital signs reviewed and stable Respiratory status: spontaneous breathing, nonlabored ventilation, respiratory function stable and patient connected to nasal cannula oxygen Cardiovascular status: blood pressure returned to baseline and stable Postop Assessment: no signs of nausea or vomiting Anesthetic complications: no    Last Vitals:  Filed Vitals:   01/19/16 1759 01/19/16 1800  BP:  153/58  Pulse:    Temp: 36.6 C   Resp:      Last Pain:  Filed Vitals:   01/19/16 1802  PainSc: 10-Worst pain ever                 Effie Berkshire

## 2016-01-19 NOTE — Clinical Social Work Note (Signed)
CSW continues to follow the patient for discharge needs. CSW will need to provide clinical updates to [facility] to ensure that her needs will still be met by the facility. CSW remains available for support to patient and family.   Liz Beach MSW, [place], [place], [phone]

## 2016-01-19 NOTE — Progress Notes (Signed)
PROGRESS NOTE  [name]  DGL:[id number] DOB: 15-Jan-1925  DOA: 01/11/2016 PCP: [name], MD   Brief Narrative:  82 year old female patient with a PMH of asthma, CAD, chronic diastolic CHF, DM, hypothyroid, GERD, HTN, presented to [facility] ED on 01/11/16 with 3-4 day history of dyspnea and productive cough, failed outpatient azithromycin, admitted with diagnosis of sepsis secondary to LLL CAP. Completed treatment for pneumonia. Persistent PSBO despite conservative management for several days. General surgery plans surgery on 6/1. Cardiology has seen for preop clearance.   Assessment & Plan:   Principal Problem:   Sepsis due to pneumonia [facility]) Active Problems:   Diabetes mellitus type 2, controlled ([place])   Coronary atherosclerosis   Asthma with bronchitis   HTN (hypertension)   HLD (hyperlipidemia)   Hypothyroid   GERD (gastroesophageal reflux disease)   Diabetes mellitus type 2 without retinopathy ([place])   Sepsis ([place])   Acute on chronic respiratory failure (H[facility])   SBO (small bowel obstruction) (H[facility])   Hypokalemia   Preop cardiovascular exam   Sepsis due to Right lower lobe community-acquired pneumonia - Met sepsis criteria on admission. - Started empirically on Rocephin and azithromycin. Initially admitted to stepdown unit. - Blood cultures 2: Negative. Urine culture times one: Negative. Lactate: 1.9 > 1.2. Pro-calcitonin 0.26. - Has completed 8 days of IV antibiotics and antibiotics discontinued. - Repeat chest x-ray 5/29 shows resolution. - Sepsis resolved.  Partial Small bowel obstruction - Felt to be secondary to adhesions. - Gen. surgery consulted. Treated per SBO protocol. Managed conservatively with nothing by mouth, NG tube. Plavix was held in case surgical intervention was needed. - Despite several days of conservative treatment, patient has persistent partial small bowel obstruction. General surgery is carefully evaluated, discussed with patient and family obtained  preoperative cardiology evaluation (increased risk) and planned to proceed with surgery on 6/1. - Expect postop ileus. Poor nutrition for several days and hence plan for PICC line and TPN.  Acute kidney injury  - Presented with creatinine of 2.49. Likely secondary to dehydration and intravascular volume depletion. Resolved. Creatinine has been normal for the last several days.   Acute respiratory failure with hypoxia - Secondary to community-acquired pneumonia. Resolved.  Anemia  - Stable.   Leukocytosis - Resolved   Type II DM - Good inpatient control. Continue SSI. Oral hypoglycemics held.  Chronic diastolic CHF - Compensated. Continue home dose Lasix.   Hypothyroid - Continue Synthroid . We will change meds to IV while she is nothing by mouth.  Essential hypertension - Controlled on metoprolol.  Hyperlipidemia -Statins    GERD - PPI   Anxiety & depression  - Continue home medications.   CAD - Minimally elevated troponin but flat trend. No chest pain reported. Likely demand ischemia related to acute illness. Aspirin resumed. Plavix held in case surgery needed.  - Cardiology input regarding preop clearance appreciated: Recommend proceeding with surgery without further ischemia evaluation realizing there is some increased risk.  Hypokalemia - Resolved.   Acute urinary retention - Has Foley catheter. Continue for now. We will have to perform voiding trials postoperatively.   Asthma - Stable.  DVT prophylaxis: SCDs, subcutaneous heparin Code Status: Full Family Communication: Discussed with patient, son and nephew at bedside on 6/1. Disposition Plan: DC to SNF when medically stable. Not medically stable for discharge and may take several days.   Consultants:   General surgery  Cardiology.  Procedures:   NG tube-discontinued  Foley catheter.  Antimicrobials:   IV Rocephin 5/24 >  5/31   Azithromycin  5/24 > 5/31    Subjective: States that she had  a rough night last night with worsening abdominal pain. Still having BMs. No vomiting but does report burping. No chest pain or dyspnea.   Objective:  Filed Vitals:   01/19/16 0425 01/19/16 0837 01/19/16 1006 01/19/16 1008  BP: 128/60 148/55    Pulse: 98     Temp: 99.5 F (37.5 C) 99.2 F (37.3 C)    TempSrc: Oral Oral    Resp: 18     Height:      Weight: 83.553 kg (184 lb 3.2 oz)     SpO2: 94% 96% 96% 98%    Intake/Output Summary (Last 24 hours) at 01/19/16 1045 Last data filed at 01/19/16 0900  Gross per 24 hour  Intake    360 ml  Output   1380 ml  Net  -1020 ml   Filed Weights   01/17/16 0358 01/18/16 0438 01/19/16 0425  Weight: 82.918 kg (182 lb 12.8 oz) 82.328 kg (181 lb 8 oz) 83.553 kg (184 lb 3.2 oz)    Examination:  General exam: Small built and frail pleasant elderly female lying comfortably propped up in bed. Respiratory system: Clear to auscultation. Respiratory effort normal. Cardiovascular system: S1 & S2 heard, RRR. No JVD, murmurs, rubs, gallops or clicks. No pedal edema. Gastrointestinal system: Abdomen is moderately distended, soft and nontender. No organomegaly or masses felt. Normal bowel sounds heard. Central nervous system: Alert and oriented. No focal neurological deficits. Extremities: Symmetric 5 x 5 power. Skin: No rashes, lesions or ulcers Psychiatry: Judgement and insight appear normal. Mood & affect appropriate.     Data Reviewed: I have personally reviewed following labs and imaging studies  CBC:  Recent Labs Lab 01/12/16 1819 01/13/16 0719 01/16/16 0322 01/19/16 0452  WBC 9.2 6.2 8.2 8.7  NEUTROABS  --   --   --  6.0  HGB 10.6* 9.7* 9.0* 9.5*  HCT 34.6* 31.2* 30.0* 31.9*  MCV 80.8 81.0 81.7 81.8  PLT 312 271 273 962   Basic Metabolic Panel:  Recent Labs Lab 01/15/16 0355 01/16/16 0322 01/17/16 0424 01/18/16 0523 01/19/16 0452  NA 144 140 140 140 140  K 3.2* 3.5 3.5 3.8 4.0  CL 114* 111 109 108 107  CO2 24 23 24 26  26   GLUCOSE 159* 165* 156* 142* 132*  BUN 22* 10 8 7 7   CREATININE 0.74 0.64 0.64 0.79 0.79  CALCIUM 8.3* 8.4* 8.8* 8.8* 8.8*  MG  --   --   --   --  1.8  PHOS  --   --   --   --  4.2   GFR: Estimated Creatinine Clearance: 47.7 mL/min (by C-G formula based on Cr of 0.79). Liver Function Tests:  Recent Labs Lab 01/19/16 0452  AST 39  ALT 49  ALKPHOS 48  BILITOT 0.1*  PROT 5.3*  ALBUMIN 2.2*   No results for input(s): LIPASE, AMYLASE in the last 168 hours. No results for input(s): AMMONIA in the last 168 hours. Coagulation Profile: No results for input(s): INR, PROTIME in the last 168 hours. Cardiac Enzymes:  Recent Labs Lab 01/12/16 1103 01/12/16 1544  TROPONINI 0.05* 0.05*   BNP (last 3 results) No results for input(s): PROBNP in the last 8760 hours. HbA1C: No results for input(s): HGBA1C in the last 72 hours. CBG:  Recent Labs Lab 01/18/16 1637 01/18/16 1958 01/19/16 0006 01/19/16 0427 01/19/16 0739  GLUCAP 142* 223* 136* 130*  116*   Lipid Profile:  Recent Labs  01/19/16 0453  TRIG 173*   Thyroid Function Tests: No results for input(s): TSH, T4TOTAL, FREET4, T3FREE, THYROIDAB in the last 72 hours. Anemia Panel: No results for input(s): VITAMINB12, FOLATE, FERRITIN, TIBC, IRON, RETICCTPCT in the last 72 hours.  Sepsis Labs: No results for input(s): PROCALCITON, LATICACIDVEN in the last 168 hours.  Recent Results (from the past 240 hour(s))  Blood Culture (routine x 2)     Status: None   Collection Time: 01/11/16  7:57 AM  Result Value Ref Range Status   Specimen Description BLOOD LEFT ANTECUBITAL  Final   Special Requests IN PEDIATRIC BOTTLE 2CC  Final   Culture NO GROWTH 5 DAYS  Final   Report Status 01/16/2016 FINAL  Final  Blood Culture (routine x 2)     Status: None   Collection Time: 01/11/16  7:58 AM  Result Value Ref Range Status   Specimen Description BLOOD LEFT HAND  Final   Special Requests BOTTLES DRAWN AEROBIC ONLY 5CC  Final    Culture NO GROWTH 5 DAYS  Final   Report Status 01/16/2016 FINAL  Final  Urine culture     Status: None   Collection Time: 01/11/16  9:57 AM  Result Value Ref Range Status   Specimen Description URINE, CATHETERIZED  Final   Special Requests NONE  Final   Culture NO GROWTH  Final   Report Status 01/12/2016 FINAL  Final  MRSA PCR Screening     Status: None   Collection Time: 01/11/16  6:00 PM  Result Value Ref Range Status   MRSA by PCR NEGATIVE NEGATIVE Final    Comment:        The GeneXpert MRSA Assay (FDA approved for NASAL specimens only), is one component of a comprehensive MRSA colonization surveillance program. It is not intended to diagnose MRSA infection nor to guide or monitor treatment for MRSA infections.          Radiology Studies: Dg Abd 2 Views  01/18/2016  CLINICAL DATA:  Followup for bowel obstruction. EXAM: ABDOMEN - 2 VIEW COMPARISON:  01/14/2016 FINDINGS: There are distended loops of small bowel with multiple air-fluid levels. Small bowel dilation appears increased when compared to the most recent prior exam. Air and contrast is seen within normal caliber colon. There is no free air. IMPRESSION: 1. Findings consistent with a partial small bowel obstruction. The degree of small bowel dilation has increased since the prior exam. No free air. Electronically Signed   By: Lajean Manes M.D.   On: 01/18/2016 11:23   Dg Abd Portable 1v  01/19/2016  CLINICAL DATA:  Patient with abdominal pain for 5 days. EXAM: PORTABLE ABDOMEN - 1 VIEW COMPARISON:  , radiograph 01/18/2016. FINDINGS: Re- demonstrated similar multiple gaseous distended loops of small bowel within the central abdomen. Small amount of contrast within the descending colon. Supine evaluation limited the detection of free intraperitoneal air. IMPRESSION: Findings most compatible with small bowel obstruction. Electronically Signed   By: Lovey Newcomer M.D.   On: 01/19/2016 08:21        Scheduled Meds: .  arformoterol  15 mcg Nebulization BID   And  . umeclidinium bromide  1 puff Inhalation Daily  . aspirin EC  81 mg Oral QHS  . azelastine  1 spray Each Nare BID  . chlorhexidine      . clorazepate  15 mg Oral QHS  . ezetimibe  10 mg Oral Daily  . feeding supplement  1 Container Oral TID BM  . furosemide  20 mg Oral QODAY  . guaiFENesin  600 mg Oral BID  . heparin subcutaneous  5,000 Units Subcutaneous Q8H  . insulin aspart  0-9 Units Subcutaneous TID WC  . levothyroxine  25 mcg Oral QAC breakfast  . magnesium sulfate 1 - 4 g bolus IVPB  1 g Intravenous Once  . metoprolol succinate  100 mg Oral Daily  . montelukast  10 mg Oral QHS  . pantoprazole  40 mg Oral Daily  . polyethylene glycol  17 g Oral Daily  . QUEtiapine  50 mg Oral Q breakfast  . rosuvastatin  20 mg Oral QHS  . sodium chloride flush  3 mL Intravenous Q12H  . topiramate  25 mg Oral QHS   Continuous Infusions: . Marland KitchenTPN (CLINIMIX-E) Adult     And  . fat emulsion       LOS: 8 days    Time spent: 30 minutes.    [facility], MD [facility] Pager [phone]  If 7PM-7AM, please contact night-coverage [email or web address] Password [email or web address] 01/19/2016, 10:45 AM

## 2016-01-19 NOTE — Progress Notes (Signed)
Pt arrived from PACU, VSS, dsg intact no signs of drainage, oriented to unit and routine, call bell within reach, family at bedside, CMT and elink notified.  Will continue to monitor.

## 2016-01-19 NOTE — Progress Notes (Signed)
Peripherally Inserted Central Catheter/Midline Placement  The IV Nurse has discussed with the patient and/or persons authorized to consent for the patient, the purpose of this procedure and the potential benefits and risks involved with this procedure.  The benefits include less needle sticks, lab draws from the catheter and patient may be discharged home with the catheter.  Risks include, but not limited to, infection, bleeding, blood clot (thrombus formation), and puncture of an artery; nerve damage and irregular heat beat.  Alternatives to this procedure were also discussed.  PICC/Midline Placement Documentation        Bethany Hirt, Nicolette Bang 01/19/2016, 8:47 PM

## 2016-01-19 NOTE — Progress Notes (Signed)
Initial Nutrition Assessment  DOCUMENTATION CODES:   Non-severe (moderate) malnutrition in context of chronic illness, Obesity unspecified  INTERVENTION:    TPN dosing per Pharmacy to meet nutrition needs as able.  NUTRITION DIAGNOSIS:   Malnutrition related to chronic illness as evidenced by mild depletion of muscle mass, percent weight loss (10% weight loss within 6 months).  GOAL:   Patient will meet greater than or equal to 90% of their needs  MONITOR:   Diet advancement, Labs, Weight trends, I & O's  REASON FOR ASSESSMENT:   Consult New TPN/TNA  ASSESSMENT:   82 y.o. female with a Past Medical History of asthma, CAD, DM, hypogonadism, GERD, CAD, who presents with sepsis from CAP and possible UTI.  Patient found to have partial SBO likely secondary to adhesions; no improvement with conservative treatment, so patient is in OR today for dx laparoscopy with possible resection. She has been on and off of full liquids/soft diet with poor intake since admission.  Patient reports weight loss over the past few months, 10% within 6 months is significant. Nutrition-Focused physical exam completed. Findings are no fat depletion, mild-moderate muscle depletion, and moderate edema.  Patient with moderate malnutrition.  PICC to be placed today for initiation of TPN.  Diet Order:  Diet NPO time specified Except for: Sips with Meds TPN (CLINIMIX-E) Adult  Skin:  Reviewed, no issues  Last BM:  5/31  Height:   Ht Readings from Last 1 Encounters:  01/11/16 5\' 1"  (1.549 m)    Weight:   Wt Readings from Last 1 Encounters:  01/19/16 184 lb 3.2 oz (83.553 kg)    Ideal Body Weight:  47.7 kg  BMI:  Body mass index is 34.82 kg/(m^2).  Estimated Nutritional Needs:   Kcal:  1700-1900  Protein:  100-120 gm  Fluid:  1.7-2 L  EDUCATION NEEDS:   No education needs identified at this time  Molli Barrows, [place], [place], [place] Pager [phone] After Hours Pager [phone]

## 2016-01-19 NOTE — Progress Notes (Signed)
Chaplain visited patient and family while rounding to provide spiritual support. Patients family were present and asked for prayer before surgery. Chaplain prayed and will follow up as needed.  Darene Lamer Rayman Petrosian 1:32 PM Pager: [phone]    01/19/16 1300  Clinical Encounter Type  Visited With Patient;Family  Visit Type Initial;Spiritual support;Pre-op  Referral From Chaplain  Spiritual Encounters  Spiritual Needs Prayer;Emotional  Stress Factors  Patient Stress Factors Health changes  Family Stress Factors Health changes

## 2016-01-19 NOTE — Progress Notes (Signed)
PARENTERAL NUTRITION CONSULT NOTE - INITIAL  Pharmacy Consult for TPN Indication: Bowel Obstruction   No Known Allergies  Patient Measurements: Height: 5\' 1"  (154.9 cm) Weight: 184 lb 3.2 oz (83.553 kg) IBW/kg (Calculated) : 47.8  Vital Signs: Temp: 99.5 F (37.5 C) (06/01 0425) Temp Source: Oral (06/01 0425) BP: 128/60 mmHg (06/01 0425) Pulse Rate: 98 (06/01 0425) Intake/Output from previous day: 05/31 0701 - 06/01 0700 In: 240 [P.O.:240] Out: 1380 [Urine:1380] Intake/Output from this shift:    Labs:  Recent Labs  01/19/16 0452  WBC 8.7  HGB 9.5*  HCT 31.9*  PLT 255     Recent Labs  01/17/16 0424 01/18/16 0523 01/18/16 1541 01/19/16 0452 01/19/16 0453  NA 140 140  --  140  --   K 3.5 3.8  --  4.0  --   CL 109 108  --  107  --   CO2 24 26  --  26  --   GLUCOSE 156* 142*  --  132*  --   BUN 8 7  --  7  --   CREATININE 0.64 0.79  --  0.79  --   CALCIUM 8.8* 8.8*  --  8.8*  --   MG  --   --   --  1.8  --   PHOS  --   --   --  4.2  --   PROT  --   --   --  5.3*  --   ALBUMIN  --   --   --  2.2*  --   AST  --   --   --  39  --   ALT  --   --   --  49  --   ALKPHOS  --   --   --  48  --   BILITOT  --   --   --  0.1*  --   PREALBUMIN  --   --  15.5* 14.7*  --   TRIG  --   --   --   --  173*   Estimated Creatinine Clearance: 47.7 mL/min (by C-G formula based on Cr of 0.79).    Recent Labs  01/19/16 0006 01/19/16 0427 01/19/16 0739  GLUCAP 136* 130* 116*    Medical History: Past Medical History  Diagnosis Date  . Asthma   . Coronary artery disease   . Diabetes mellitus without complication ([place])   . Hypothyroidism   . GERD (gastroesophageal reflux disease)   . Insomnia   . Anxiety   . Hypertension   . CAD (coronary artery disease)   . Osteopenia   . Osteoarthritis   . H/O echocardiogram 06-26-2012    EF 55%  . H/O cardiovascular stress test 06-26-2012    compared to previous study there is no significant change; normal myocardial  perfusion study. no significant ischemia demonstrated . this is a low risk scan  . History of cardiac cath 09-30-1998    cath done by Dr. Claiborne Billings, successful rotation atherectomy of the left anterior decending artert with reduction from 80 to105  . Hx of cardiac cath 09-26-1998    dignostic cath dr. Claiborne Billings to do rotoblator at a later date  . History of Doppler ultrasound 03-30-2008    neg abd. doppler for aneurysm  . History of Holter monitoring 12-22-2007    NSR with pac occs. pvc's    Medications:  Prescriptions prior to admission  Medication Sig Dispense Refill Last Dose  . acetaminophen-codeine (  TYLENOL #3) 300-30 MG tablet Take 1 tablet by mouth every 4 (four) hours as needed for moderate pain.   Past Week at Unknown time  . albuterol (PROVENTIL HFA;VENTOLIN HFA) 108 (90 Base) MCG/ACT inhaler Inhale 2 puffs into the lungs every 6 (six) hours as needed for wheezing or shortness of breath.   rescue at rescue  . albuterol (PROVENTIL) (2.5 MG/3ML) 0.083% nebulizer solution Take 3 mLs (2.5 mg total) by nebulization every 6 (six) hours as needed for wheezing. 75 mL 11 Past Week at Unknown time  . aspirin EC 81 MG tablet Take 81 mg by mouth at bedtime.    01/10/2016 at Unknown time  . azelastine (ASTELIN) 137 MCG/SPRAY nasal spray Place 1 spray into the nose 2 (two) times daily. Use in each nostril as directed   01/10/2016 at Unknown time  . benzonatate (TESSALON) 200 MG capsule TAKE ONE CAPSULE BY MOUTH 3 TIMES A DAY AS NEEDED FOR COUGH 30 capsule 5 Past Week at Unknown time  . clopidogrel (PLAVIX) 75 MG tablet Take 75 mg by mouth every evening.    01/10/2016 at Unknown time  . clorazepate (TRANXENE) 15 MG tablet Take 15 mg by mouth at bedtime. anxiety   01/10/2016 at Unknown time  . furosemide (LASIX) 40 MG tablet Take 20 mg by mouth every other day.   Past Week at Unknown time  . glimepiride (AMARYL) 1 MG tablet Take 1 mg by mouth daily before breakfast.   01/10/2016 at Unknown time  . guaiFENesin  (MUCINEX) 600 MG 12 hr tablet Take 600 mg by mouth 2 (two) times daily.   Past Week at Unknown time  . ibuprofen (ADVIL,MOTRIN) 200 MG tablet Take 400 mg by mouth every 6 (six) hours as needed for headache or moderate pain.   Past Week at Unknown time  . levothyroxine (SYNTHROID, LEVOTHROID) 50 MCG tablet Take 25 mcg by mouth daily.    01/10/2016 at Unknown time  . loperamide (IMODIUM A-D) 2 MG tablet Take 2 mg by mouth as needed for diarrhea or loose stools.    Past Week at Unknown time  . LORazepam (ATIVAN) 1 MG tablet Take 0.5-1 mg by mouth every 8 (eight) hours as needed for anxiety.    Past Week at Unknown time  . metoprolol succinate (TOPROL-XL) 50 MG 24 hr tablet Take 100 mg by mouth daily.   01/10/2016 at 0800  . montelukast (SINGULAIR) 10 MG tablet Take 10 mg by mouth at bedtime.   Past Week at Unknown time  . Multiple Vitamin (MULTIVITAMIN WITH MINERALS) TABS tablet Take 1 tablet by mouth daily.   01/10/2016 at Unknown time  . nitroGLYCERIN (NITROSTAT) 0.4 MG SL tablet Place 0.4 mg under the tongue every 5 (five) minutes as needed. For chest pain   unk at unk  . omeprazole (PRILOSEC) 20 MG capsule Take 20-40 mg by mouth daily as needed (acid reflux).   Past Week at Unknown time  . QUEtiapine (SEROQUEL) 25 MG tablet Take 50 mg by mouth daily with breakfast.   01/10/2016 at Unknown time  . rosuvastatin (CRESTOR) 20 MG tablet Take 20 mg by mouth at bedtime.    Past Week at Unknown time  . topiramate (TOPAMAX) 25 MG tablet Take 25 mg by mouth at bedtime.   Past Week at Unknown time  . traMADol (ULTRAM) 50 MG tablet Take 1 tablet (50 mg total) by mouth every 6 (six) hours as needed. (Patient taking differently: Take 50 mg by mouth every 6 (  six) hours as needed for moderate pain. ) 40 tablet 0 Past Week at Unknown time  . Umeclidinium-Vilanterol (ANORO ELLIPTA) 62.5-25 MCG/INH AEPB Inhale 1 puff into the lungs daily. 60 each 5 01/10/2016 at Unknown time  . ZETIA 10 MG tablet Take 10 mg by mouth daily.   2 01/10/2016 at Unknown time  . [DISCONTINUED] sucralfate (CARAFATE) 1 GM/10ML suspension Take 10 mLs (1 g total) by mouth 2 (two) times daily. (Patient not taking: Reported on 01/11/2016) 420 mL 0 Completed Course at Unknown time    Insulin Requirements in the past 24 hours:  4 units sensitive SSI   Assessment: 51 YOF who presented with shortness of breath and developed epigastric and RUQ pain. A CT scan on 5/25 showed a high grade SBO. An NGT was placed on 5/26 and SBO protocol was initiated with bowel rest. Initially was improving and diet was advanced but unfortunately, patient became bloated again and stopped having BM's since 5/29. Diet was reversed to full liquids and pharmacy was consulted to start TPN.   Surgeries/Procedures: None   GI: Currently at almost 1 week of medical management. Surgery not optimistic about resolution of SBO without surgical intervention. With really bad abd pain overnight. To OR for dx laparoscopy w/ possible laparotomy today. Continues on full liquid diet. On oral PPI   Endo: DM on glimiperide at home, hypothyroid, CBGs 116-223, On sensitive SSI  Lytes: K 4 (goal > 4), Mg 1.8 (goal > 2), Phos 4.2 Renal: SCr 0.79 Pulm: Asthma, on RA Cards: CAD, HTN, VSS, on asa, zetia, metoprolol, crestor, lasix Hepatobil: TG 173 at baseline, LFTs wnl  Neuro: Anxiety, on Seroquel, clorazepate, Topamax ID: [id number]  Best Practices: SQ heparin  TPN Access: PICC to be placed today TPN start date: 6/1   Nutritional Goals:  Per RD recommendations   Current Nutrition:  Full liquid    Plan:  -Start Clinimix E 5/15 at 40 mL/hr and IV lipids 20% at 10 mL/hr. Advance to goal as directed  -Mg sulfate 1 gm IV  -Continue sensitive SSI. Monitor CBGs as TPN is advanced  -Add MVI and TE  -F/u AM TPN labs   Albertina Parr, PharmD., BCPS Clinical Pharmacist Pager [phone]

## 2016-01-20 ENCOUNTER — Encounter (HOSPITAL_COMMUNITY): Payer: Self-pay | Admitting: General Surgery

## 2016-01-20 ENCOUNTER — Ambulatory Visit: Payer: Self-pay | Admitting: Cardiovascular Disease

## 2016-01-20 DIAGNOSIS — K219 Gastro-esophageal reflux disease without esophagitis: Secondary | ICD-10-CM

## 2016-01-20 LAB — BASIC METABOLIC PANEL
ANION GAP: 7 (ref 5–15)
BUN: 9 mg/dL (ref 6–20)
CALCIUM: 8.5 mg/dL — AB (ref 8.9–10.3)
CO2: 27 mmol/L (ref 22–32)
Chloride: 107 mmol/L (ref 101–111)
Creatinine, Ser: 0.84 mg/dL (ref 0.44–1.00)
GFR calc Af Amer: >60 mL/min (ref >60)
GFR calc non Af Amer: >60 mL/min (ref >60)
GLUCOSE: 161 mg/dL — AB (ref 65–99)
Potassium: 3.8 mmol/L (ref 3.5–5.1)
Sodium: 141 mmol/L (ref 135–145)

## 2016-01-20 LAB — CBC
HEMATOCRIT: 30.4 % — AB (ref 36.0–46.0)
Hemoglobin: 9.3 g/dL — ABNORMAL LOW (ref 12.0–15.0)
MCH: 24.9 pg — ABNORMAL LOW (ref 26.0–34.0)
MCHC: 30.6 g/dL (ref 30.0–36.0)
MCV: 81.5 fL (ref 78.0–100.0)
Platelets: 245 10*3/uL (ref 150–400)
RBC: 3.73 MIL/uL — ABNORMAL LOW (ref 3.87–5.11)
RDW: 14.4 % (ref 11.5–15.5)
WBC: 9.3 10*3/uL (ref 4.0–10.5)

## 2016-01-20 LAB — GLUCOSE, CAPILLARY
GLUCOSE-CAPILLARY: 158 mg/dL — AB (ref 65–99)
GLUCOSE-CAPILLARY: 150 mg/dL — AB (ref 65–99)
GLUCOSE-CAPILLARY: 208 mg/dL — AB (ref 65–99)
Glucose-Capillary: 159 mg/dL — ABNORMAL HIGH (ref 65–99)
Glucose-Capillary: 137 mg/dL — ABNORMAL HIGH (ref 65–99)
Glucose-Capillary: 164 mg/dL — ABNORMAL HIGH (ref 65–99)

## 2016-01-20 LAB — PHOSPHORUS: Phosphorus: 3.9 mg/dL (ref 2.5–4.6)

## 2016-01-20 LAB — MAGNESIUM: Magnesium: 2 mg/dL (ref 1.7–2.4)

## 2016-01-20 MED ORDER — TRACE MINERALS CR-CU-MN-SE-ZN 10-1000-500-60 MCG/ML IV SOLN
INTRAVENOUS | Status: AC
  Administered 2016-01-20: 18:00:00 via INTRAVENOUS
  Filled 2016-01-20: qty 1680

## 2016-01-20 MED ORDER — INSULIN ASPART 100 UNIT/ML ~~LOC~~ SOLN
0.0000 [IU] | SUBCUTANEOUS | Status: DC
  Administered 2016-01-20 (×2): 2 [IU] via SUBCUTANEOUS
  Administered 2016-01-20 – 2016-01-21 (×3): 3 [IU] via SUBCUTANEOUS
  Administered 2016-01-21: 5 [IU] via SUBCUTANEOUS

## 2016-01-20 MED ORDER — FAT EMULSION 20 % IV EMUL
240.0000 mL | INTRAVENOUS | Status: AC
  Administered 2016-01-20: 240 mL via INTRAVENOUS
  Filled 2016-01-20: qty 250

## 2016-01-20 NOTE — Progress Notes (Signed)
PROGRESS NOTE  [name]  [id number] DOB: Dec 31, 1924  DOA: 01/11/2016 PCP: [name], MD   Brief Narrative:  82 year old female patient with a PMH of asthma, CAD, chronic diastolic CHF, DM, hypothyroid, GERD, HTN, presented to [facility] ED on 01/11/16 with 3-4 day history of dyspnea and productive cough, failed outpatient azithromycin, admitted with diagnosis of sepsis secondary to LLL CAP. Completed treatment for pneumonia. Persistent PSBO despite conservative management for several days. Status post diagnostic laparoscopy converted to exploratory laparotomy, lysis of adhesions 01/19/16 and transferred to stepdown unit postoperatively.  Assessment & Plan:   Principal Problem:   Sepsis due to pneumonia [facility]) Active Problems:   Diabetes mellitus type 2, controlled ([place])   Coronary atherosclerosis   Asthma with bronchitis   HTN (hypertension)   HLD (hyperlipidemia)   Hypothyroid   GERD (gastroesophageal reflux disease)   Diabetes mellitus type 2 without retinopathy ([place])   Sepsis ([place])   Acute on chronic respiratory failure ([facility])   SBO (small bowel obstruction) ([facility])   Hypokalemia   Preop cardiovascular exam   Sepsis due to Right lower lobe community-acquired pneumonia - Met sepsis criteria on admission. - Started empirically on Rocephin and azithromycin. Initially admitted to stepdown unit. - Blood cultures 2: Negative. Urine culture times one: Negative. Lactate: 1.9 > 1.2. Pro-calcitonin 0.26. - Has completed 8 days of IV antibiotics and antibiotics discontinued. - Repeat chest x-ray 5/29 shows resolution. - Sepsis resolved.  Partial Small bowel obstruction, S/P diagnostic laparoscopy converted to exploratory laparotomy, lysis of adhesions 01/19/16  - Gen. surgery was consulted. Patient failed conservative management. After cardiology preop clearance, patient underwent laparotomy and LOA on 01/19/16 - Management per general surgery: Bowel rest, NG tube, TNA until bowel functions  return.  Acute kidney injury  - Presented with creatinine of 2.49. Likely secondary to dehydration and intravascular volume depletion. Resolved.   Acute respiratory failure with hypoxia - Secondary to community-acquired pneumonia. Resolved. Aggressive pulmonary toilet, incentive spirometry and mobilization.  Anemia  - Stable.   Leukocytosis - Resolved   Type II DM - Good inpatient control. Continue SSI. Oral hypoglycemics held.  Chronic diastolic CHF - Compensated. Continue home dose Lasix.   Hypothyroid - Continue Synthroid . We will change meds to IV while she is nothing by mouth.  Essential hypertension - Controlled on metoprolol-changed to IV while nothing by mouth..  Hyperlipidemia -Statins -may hold while nothing by mouth.   GERD - PPI IV.  Anxiety & depression  - Continue home medications-may hold some medications while nothing by mouth. IV Ativan when necessary.Marland Kitchen   CAD - Minimally elevated troponin but flat trend. No chest pain reported. Likely demand ischemia related to acute illness. Aspirin resumed. Plavix held in case surgery needed.  - Cardiology input regarding preop clearance appreciated: Recommend proceeding with surgery without further ischemia evaluation realizing there is some increased risk.  Hypokalemia - Resolved.   Acute urinary retention - Has Foley catheter. Continue for now. Consider discontinuing Foley catheter in the next 1-2 days.  Asthma - Stable.  DVT prophylaxis: SCDs, subcutaneous heparin Code Status: Full Family Communication: Discussed with patient. No family at bedside. Disposition Plan: DC to SNF when medically stable. Postoperatively, patient was transferred from medical floor to stepdown unit on 01/19/16-continue stepdown.   Consultants:   General surgery  Cardiology.  Procedures:   NG tube  Foley catheter.  Right upper arm PICC 01/19/16 >  Status post diagnostic laparoscopy converted to exploratory laparotomy,  lysis of adhesions 01/19/16  Antimicrobials:   IV Rocephin 5/24 > 5/31   Azithromycin  5/24 > 5/31    Subjective: Appropriate postop abdominal pain. No dyspnea, cough or chest pain. No flatus or BM.  Objective:  Filed Vitals:   01/20/16 0355 01/20/16 0510 01/20/16 0837 01/20/16 0858  BP: 120/50 118/38 139/51   Pulse: 87 95 101   Temp: 98.4 F (36.9 C)  99.4 F (37.4 C)   TempSrc: Oral  Oral   Resp: 19 17 22    Height:      Weight:      SpO2: 99% 97% 97% 97%    Intake/Output Summary (Last 24 hours) at 01/20/16 1102 Last data filed at 01/20/16 0842  Gross per 24 hour  Intake 2424.83 ml  Output   2705 ml  Net -280.17 ml   Filed Weights   01/18/16 0438 01/19/16 0425 01/19/16 1759  Weight: 82.328 kg (181 lb 8 oz) 83.553 kg (184 lb 3.2 oz) 85 kg (187 lb 6.3 oz)    Examination:  General exam: Small built and frail pleasant elderly female lying comfortably Supine in bed. Respiratory system: Slightly diminished breath sounds in the bases but otherwise clear to auscultation. Respiratory effort normal. Cardiovascular system: S1 & S2 heard, RRR. No JVD, murmurs, rubs, gallops or clicks. No pedal edema. Telemetry: SR with intermittent BBB morphology. Gastrointestinal system: Abdomen is minimally distended, laparotomy scar dressing clean and dry, mild appropriate postop tenderness without peritoneal signs. No organomegaly or masses felt. Diminished bowel sounds heard. Central nervous system: Alert and oriented. No focal neurological deficits. Extremities: Symmetric 5 x 5 power. Skin: No rashes, lesions or ulcers Psychiatry: Judgement and insight appear normal. Mood & affect appropriate.     Data Reviewed: I have personally reviewed following labs and imaging studies  CBC:  Recent Labs Lab 01/16/16 0322 01/19/16 0452 01/20/16 0500  WBC 8.2 8.7 9.3  NEUTROABS  --  6.0  --   HGB 9.0* 9.5* 9.3*  HCT 30.0* 31.9* 30.4*  MCV 81.7 81.8 81.5  PLT 273 255 979   Basic  Metabolic Panel:  Recent Labs Lab 01/16/16 0322 01/17/16 0424 01/18/16 0523 01/19/16 0452 01/20/16 0500  NA 140 140 140 140 141  K 3.5 3.5 3.8 4.0 3.8  CL 111 109 108 107 107  CO2 23 24 26 26 27   GLUCOSE 165* 156* 142* 132* 161*  BUN 10 8 7 7 9   CREATININE 0.64 0.64 0.79 0.79 0.84  CALCIUM 8.4* 8.8* 8.8* 8.8* 8.5*  MG  --   --   --  1.8 2.0  PHOS  --   --   --  4.2 3.9   GFR: Estimated Creatinine Clearance: 45.8 mL/min (by C-G formula based on Cr of 0.84). Liver Function Tests:  Recent Labs Lab 01/19/16 0452  AST 39  ALT 49  ALKPHOS 48  BILITOT 0.1*  PROT 5.3*  ALBUMIN 2.2*   No results for input(s): LIPASE, AMYLASE in the last 168 hours. No results for input(s): AMMONIA in the last 168 hours. Coagulation Profile: No results for input(s): INR, PROTIME in the last 168 hours. Cardiac Enzymes: No results for input(s): CKTOTAL, CKMB, CKMBINDEX, TROPONINI in the last 168 hours. BNP (last 3 results) No results for input(s): PROBNP in the last 8760 hours. HbA1C: No results for input(s): HGBA1C in the last 72 hours. CBG:  Recent Labs Lab 01/19/16 1540 01/19/16 1934 01/19/16 2305 01/20/16 0358 01/20/16 0836  GLUCAP 157* 119* 168* 158* 159*   Lipid Profile:  Recent Labs  01/19/16 0453  TRIG 173*   Thyroid Function Tests: No results for input(s): TSH, T4TOTAL, FREET4, T3FREE, THYROIDAB in the last 72 hours. Anemia Panel: No results for input(s): VITAMINB12, FOLATE, FERRITIN, TIBC, IRON, RETICCTPCT in the last 72 hours.  Sepsis Labs: No results for input(s): PROCALCITON, LATICACIDVEN in the last 168 hours.  Recent Results (from the past 240 hour(s))  Blood Culture (routine x 2)     Status: None   Collection Time: 01/11/16  7:57 AM  Result Value Ref Range Status   Specimen Description BLOOD LEFT ANTECUBITAL  Final   Special Requests IN PEDIATRIC BOTTLE 2CC  Final   Culture NO GROWTH 5 DAYS  Final   Report Status 01/16/2016 FINAL  Final  Blood Culture  (routine x 2)     Status: None   Collection Time: 01/11/16  7:58 AM  Result Value Ref Range Status   Specimen Description BLOOD LEFT HAND  Final   Special Requests BOTTLES DRAWN AEROBIC ONLY 5CC  Final   Culture NO GROWTH 5 DAYS  Final   Report Status 01/16/2016 FINAL  Final  Urine culture     Status: None   Collection Time: 01/11/16  9:57 AM  Result Value Ref Range Status   Specimen Description URINE, CATHETERIZED  Final   Special Requests NONE  Final   Culture NO GROWTH  Final   Report Status 01/12/2016 FINAL  Final  MRSA PCR Screening     Status: None   Collection Time: 01/11/16  6:00 PM  Result Value Ref Range Status   MRSA by PCR NEGATIVE NEGATIVE Final    Comment:        The GeneXpert MRSA Assay (FDA approved for NASAL specimens only), is one component of a comprehensive MRSA colonization surveillance program. It is not intended to diagnose MRSA infection nor to guide or monitor treatment for MRSA infections.   Surgical pcr screen     Status: None   Collection Time: 01/19/16 11:59 AM  Result Value Ref Range Status   MRSA, PCR NEGATIVE NEGATIVE Final   Staphylococcus aureus NEGATIVE NEGATIVE Final    Comment:        The Xpert SA Assay (FDA approved for NASAL specimens in patients over 32 years of age), is one component of a comprehensive surveillance program.  Test performance has been validated by [facility] for patients greater than or equal to 15 year old. It is not intended to diagnose infection nor to guide or monitor treatment.          Radiology Studies: Dg Abd 2 Views  01/18/2016  CLINICAL DATA:  Followup for bowel obstruction. EXAM: ABDOMEN - 2 VIEW COMPARISON:  01/14/2016 FINDINGS: There are distended loops of small bowel with multiple air-fluid levels. Small bowel dilation appears increased when compared to the most recent prior exam. Air and contrast is seen within normal caliber colon. There is no free air. IMPRESSION: 1. Findings consistent  with a partial small bowel obstruction. The degree of small bowel dilation has increased since the prior exam. No free air. Electronically Signed   By: Lajean Manes M.D.   On: 01/18/2016 11:23   Dg Abd Portable 1v  01/19/2016  CLINICAL DATA:  Patient with abdominal pain for 5 days. EXAM: PORTABLE ABDOMEN - 1 VIEW COMPARISON:  , radiograph 01/18/2016. FINDINGS: Re- demonstrated similar multiple gaseous distended loops of small bowel within the central abdomen. Small amount of contrast within the descending colon. Supine evaluation limited the detection of free intraperitoneal  air. IMPRESSION: Findings most compatible with small bowel obstruction. Electronically Signed   By: Lovey Newcomer M.D.   On: 01/19/2016 08:21        Scheduled Meds: . acetaminophen  1,000 mg Intravenous Q6H  . arformoterol  15 mcg Nebulization BID   And  . umeclidinium bromide  1 puff Inhalation Daily  . aspirin  300 mg Rectal QHS  . azelastine  1 spray Each Nare BID  . clorazepate  15 mg Oral QHS  . ezetimibe  10 mg Oral Daily  . [START ON 01/21/2016] furosemide  20 mg Intravenous Q48H  . heparin subcutaneous  5,000 Units Subcutaneous Q8H  . insulin aspart  0-15 Units Subcutaneous Q4H  . levothyroxine  12.5 mcg Intravenous Daily  . metoprolol  5 mg Intravenous Q6H  . montelukast  10 mg Oral QHS  . pantoprazole (PROTONIX) IV  40 mg Intravenous Q24H  . QUEtiapine  50 mg Oral Q breakfast  . rosuvastatin  20 mg Oral QHS  . sodium chloride flush  10-40 mL Intracatheter Q12H  . sodium chloride flush  3 mL Intravenous Q12H  . topiramate  25 mg Oral QHS   Continuous Infusions: . Marland KitchenTPN (CLINIMIX-E) Adult 40 mL/hr at 01/20/16 0800   And  . fat emulsion 240 mL (01/20/16 0800)  . Marland KitchenTPN (CLINIMIX-E) Adult     And  . fat emulsion       LOS: 9 days   Time spent: 30 minutes.   [facility], MD [facility] Pager [phone]  If 7PM-7AM, please contact night-coverage [email or web address] Password [email or web address] 01/20/2016,  11:02 AM

## 2016-01-20 NOTE — Progress Notes (Signed)
Physical Therapy Treatment/Re-eval Patient Details Name: [name] MRN: [id number] DOB: [birth year] Today's Date: 01/20/2016    History of Present Illness 82 y.o. female adm with 3-4d of SOB, also with abd distension, found to have SBO d/y adhesion;    PMHx: Asthma not on home oxygen, CAD, DM2, hypothyroidism, GERD, CAD, OA, R TKA.  Pt s/p exploratory laparotomy due to partial SBO on 01/19/16.      PT Comments    Pt was able to get up OOB to chair with RW and one person mod assist.  She continues to be appropriate for SNF placement for rehab at discharge.  PT to follow acutely until d/c confirmed.     Follow Up Recommendations  SNF     Equipment Recommendations  Other (comment) (shower chair)    Recommendations for Other Services   NA     Precautions / Restrictions Precautions Precautions: Fall Precaution Comments: NGT, monitor HR Restrictions Weight Bearing Restrictions: No    Mobility  Bed Mobility Overal bed mobility: Needs Assistance Bed Mobility: Rolling;Sidelying to Sit Rolling: Mod assist Sidelying to sit: Mod assist;HOB elevated       General bed mobility comments: Mod assist to support trunk during transitions.  Pt needed assit at legs hips and trunk.  HOB elevated ~30 degrees  Transfers Overall transfer level: Needs assistance Equipment used: Rolling walker (2 wheeled) Transfers: Sit to/from [facility] Sit to Stand: Mod assist Stand pivot transfers: Mod assist       General transfer comment: Mod assist to support trunk to power up and turn with RW to chair over weak legs and painful abdomen.           Balance Overall balance assessment: Needs assistance Sitting-balance support: Feet supported;Bilateral upper extremity supported Sitting balance-Leahy Scale: Fair Sitting balance - Comments: close supervision once feet supported on ground   Standing balance support: Bilateral upper extremity supported Standing balance-Leahy Scale:  Poor                      Cognition Arousal/Alertness: Awake/alert Behavior During Therapy: WFL for tasks assessed/performed Overall Cognitive Status: Within Functional Limits for tasks assessed                      Exercises General Exercises - Lower Extremity Ankle Circles/Pumps: AROM;Both;10 reps        Pertinent Vitals/Pain Pain Assessment: Faces Faces Pain Scale: Hurts whole lot Pain Location: abdomen/incisional Pain Descriptors / Indicators: Guarding;Grimacing Pain Intervention(s): Limited activity within patient's tolerance;Monitored during session;Repositioned;Patient requesting pain meds-RN notified           PT Goals (current goals can now be found in the care plan section) Acute Rehab PT Goals Patient Stated Goal: decrease pain, get back home PT Goal Formulation: With patient Time For Goal Achievement: 02/03/16 Potential to Achieve Goals: Good Progress towards PT goals: Progressing toward goals (re-eval preformed, goals updated post op)    Frequency  Min 2X/week (would benefit from more)    PT Plan Frequency needs to be updated       End of Session   Activity Tolerance: Patient limited by pain Patient left: in chair;with call bell/phone within reach;with nursing/sitter in room     Time: [id number] PT Time Calculation (min) (ACUTE ONLY): 24 min  Charges: 1 re-eval $Therapeutic Activity: 8-22 mins                     [name] B. [name],  PT, DPT [id number]   01/20/2016, 11:50 AM

## 2016-01-20 NOTE — Clinical Social Work Note (Signed)
CSW acknowledges consult for advance directives. Spiritual care/chaplain will assist with this. There is a consult in for them as well.   CSW will continue to assist with SNF placement.  [name], [place]

## 2016-01-20 NOTE — Care Management Important Message (Signed)
Important Message  Patient Details  Name: [name] MRN: [id number] Date of Birth: [birth year]   Medicare Important Message Given:  Yes    Loann Quill 01/20/2016, 9:38 AM

## 2016-01-20 NOTE — Consult Note (Signed)
[place] spoke with RN; AD/HCPOA paperwork given to pt; Edie will follow-up to determine time and review document. Gwynn Burly 9:46 AM

## 2016-01-20 NOTE — Care Management Note (Signed)
Case Management Note  Patient Details  Name: [name] MRN: [id number] Date of Birth: 1925-02-10  Subjective/Objective:  Patient will be going to SNF at dc, CSW following                  Action/Plan:   Expected Discharge Date:  01/14/16               Expected Discharge Plan:  [place]  In-House Referral:  Clinical Social Work  Discharge planning Services  CM Consult  Post Acute Care Choice:    Choice offered to:     DME Arranged:    DME Agency:     HH Arranged:    [place] Agency:     Status of Service:  Completed, signed off  Medicare Important Message Given:  Yes Date Medicare IM Given:    Medicare IM give by:    Date Additional Medicare IM Given:    Additional Medicare Important Message give by:     If discussed at [place] of Stay Meetings, dates discussed:    Additional Comments:  Zenon Mayo, RN 01/20/2016, 3:53 PM

## 2016-01-20 NOTE — Clinical Social Work Note (Signed)
CSW provided daily updated to Jolyne Loa, admissions coordinator at [facility].  Dayton Scrape, [place]

## 2016-01-20 NOTE — Progress Notes (Signed)
   01/20/16 1000  Clinical Encounter Type  Visited With Patient;Family;Patient and family together  Visit Type Follow-up (ADV DIR)  Referral From Patient  Spiritual Encounters  Spiritual Needs Literature;Emotional  CH spoke with pt and son at bedside to go over timeline and basic procedure for HCPOA and overall assessment.  Son will review and let RN know when complete and notify [place].  Gwynn Burly 10:27 AM

## 2016-01-20 NOTE — Progress Notes (Signed)
PARENTERAL NUTRITION CONSULT NOTE - INITIAL  Pharmacy Consult for TPN Indication: Bowel Obstruction   No Known Allergies  Patient Measurements: Height: 5\' 1"  (154.9 cm) Weight: 187 lb 6.3 oz (85 kg) IBW/kg (Calculated) : 47.8   Insulin Requirements in the past 24 hours:  4 units of sensitive SSI   Assessment: 28 YOF who presented with shortness of breath and developed epigastric and RUQ pain. A CT scan on 5/25 showed a high grade SBO. An NGT was placed on 5/26 and SBO protocol was initiated with bowel rest. Initially was improving and diet was advanced but unfortunately, patient became bloated again and stopped having BM's since 5/29. Diet was reversed to full liquids and pharmacy was consulted to start TPN.   Surgeries/Procedures: None   GI: Currently at 1 week of medical management. Went to OR for ex lap on 6/1. Found to have adhesive band going over top of loop of small bowel causing the obstruction. Has been on and off full liquid / soft diet since admission with poor intake. Looks good today but has not had any flatus.  NG tube had 293ml of output yesterday. Albumin is 2.2, prealbumin 14.7. On oral PPI   Endo: DM on glimiperide at home, hypothyroid. CBGs controlled 110-160s on sensitive SSI  Lytes: wnl including K 3.8, Mg 2.0, Phos 3.9 Renal: SCr stable, CrCl ~11ml/min Pulm: Asthma, on RA Cards: CAD, HTN, VSS, on asa, zetia, metoprolol, crestor, lasix Hepatobil: TG 173 at baseline, LFTs wnl. Tbili 0.1. Neuro: Anxiety, on Seroquel, clorazepate, Topamax ID: [id number] wnl  Best Practices: SQ heparin  TPN Access: PICC to be placed today TPN start date: 6/1  Nutritional Goals: per RD recs on 6/1 Kcal: 1700-1900 Protein: 100-120 g  Current Nutrition:  NPO Clinimix E 5/15 at 53ml/hr IV 20% lipid emulsions at 41ml/hr  Plan:  Increase Clinimix E 5/15 to 16ml/hr Continue 20% lipid emulsion at 61ml/hr This provides 84 g of protein and 1672 kCals per day meeting 75% of protein and  90% of kCal needs Continue MVI and TE in TPN Increase to moderate SSI and adjust as needed Monitor TPN labs,  F/U advancement of diet  Elenor Quinones, PharmD, [facility] Clinical Pharmacist Pager [phone] 01/20/2016 7:47 AM

## 2016-01-20 NOTE — Progress Notes (Signed)
1 Day Post-Op  Subjective: She looks pretty good, I changed the air filter on sump and it is working well.  She has not had any flatus so far. Pretty sore.  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    Objective: Vital signs in last 24 hours: Temp:  [97.7 F (36.5 C)-99.4 F (37.4 C)] 99.4 F (37.4 C) (06/02 0837) Pulse Rate:  [87-122] 101 (06/02 0837) Resp:  [10-22] 22 (06/02 0837) BP: (118-153)/(38-58) 139/51 mmHg (06/02 0837) SpO2:  [92 %-99 %] 97 % (06/02 0837) Arterial Line BP: (158-175)/(53-60) 171/59 mmHg (06/01 1645) Weight:  [85 kg (187 lb 6.3 oz)] 85 kg (187 lb 6.3 oz) (06/01 1759) Last BM Date: 2016/02/15 240 PO TNA 524/1850 IV fluids Urine 1425 Emesis 200 Afebrile, VSS Labs OK this AM Intake/Output from previous day: 06/01 0701 - 06/02 0700 In: 2614.8 [P.O.:240; I.V.:1400; IV Piggyback:450; TPN:524.8] Out: 2205 [Urine:1425; Emesis/NG output:200; Blood:30] Intake/Output this shift: Total I/O In: 50 [TPN:50] Out: 500 [Urine:300; Emesis/NG output:200]  General appearance: alert, cooperative, no distress and tired Resp: clear to auscultation bilaterally and anterior exam GI: soft, sore, but no peritionitis.  No BS, no flatus, dressing looks fine.    Lab Results:   Recent Labs  01/19/16 0452 01/20/16 0500  WBC 8.7 9.3  HGB 9.5* 9.3*  HCT 31.9* 30.4*  PLT 255 245    BMET  Recent Labs  01/19/16 0452 01/20/16 0500  NA 140 141  K 4.0 3.8  CL 107 107  CO2 26 27  GLUCOSE 132* 161*  BUN 7 9  CREATININE 0.79 0.84  CALCIUM 8.8* 8.5*   PT/INR No results for input(s): LABPROT, INR in the last 72 hours.   Recent Labs Lab 01/19/16 0452  AST 39  ALT 49  ALKPHOS 48  BILITOT 0.1*  PROT 5.3*  ALBUMIN 2.2*     Lipase     Component Value Date/Time   LIPASE 33 08/04/2013 0010     Studies/Results: Dg Abd 2 Views  02/15/2016  CLINICAL DATA:  Followup for bowel obstruction. EXAM: ABDOMEN - 2 VIEW COMPARISON:  01/14/2016 FINDINGS: There are  distended loops of small bowel with multiple air-fluid levels. Small bowel dilation appears increased when compared to the most recent prior exam. Air and contrast is seen within normal caliber colon. There is no free air. IMPRESSION: 1. Findings consistent with a partial small bowel obstruction. The degree of small bowel dilation has increased since the prior exam. No free air. Electronically Signed   By: Lajean Manes M.D.   On: 02-15-2016 11:23   Dg Abd Portable 1v  01/19/2016  CLINICAL DATA:  Patient with abdominal pain for 5 days. EXAM: PORTABLE ABDOMEN - 1 VIEW COMPARISON:  , radiograph 2016-02-15. FINDINGS: Re- demonstrated similar multiple gaseous distended loops of small bowel within the central abdomen. Small amount of contrast within the descending colon. Supine evaluation limited the detection of free intraperitoneal air. IMPRESSION: Findings most compatible with small bowel obstruction. Electronically Signed   By: Lovey Newcomer M.D.   On: 01/19/2016 08:21    Medications: . acetaminophen  1,000 mg Intravenous Q6H  . arformoterol  15 mcg Nebulization BID   And  . umeclidinium bromide  1 puff Inhalation Daily  . aspirin  300 mg Rectal QHS  . azelastine  1 spray Each Nare BID  . clorazepate  15 mg Oral QHS  . ezetimibe  10 mg Oral Daily  . [START ON 01/21/2016] furosemide  20 mg  Intravenous Q48H  . heparin subcutaneous  5,000 Units Subcutaneous Q8H  . insulin aspart  0-9 Units Subcutaneous Q4H  . levothyroxine  12.5 mcg Intravenous Daily  . metoprolol  5 mg Intravenous Q6H  . montelukast  10 mg Oral QHS  . pantoprazole (PROTONIX) IV  40 mg Intravenous Q24H  . QUEtiapine  50 mg Oral Q breakfast  . rosuvastatin  20 mg Oral QHS  . sodium chloride flush  10-40 mL Intracatheter Q12H  . sodium chloride flush  3 mL Intravenous Q12H  . topiramate  25 mg Oral QHS   . Marland KitchenTPN (CLINIMIX-E) Adult 40 mL/hr at 01/20/16 0800   And  . fat emulsion 240 mL (01/20/16 0800)    Assessment/Plan Partial  small bowel Obstruction S/p Diagnostic laparoscopy converted to exploratory laparotomy, LYSIS OF ADHESIONS, 01/19/16, Dr. Greer Pickerel RLL pneumonia, community acquired with sepsis AODM type II CAD/minimally elevated troponin Acute Kidney injury/urinary retention Hypothyroid Hypertension Hx of asthma FEN:  NPO/TNA ID: 8 days of Ceftriaxone/azithromycin completed 01/18/16; Cefotetan pre op 01/19/16 DVT:  Heparin/SCD Anemia    Plan:  Continue NG for now.  TNA till bowel function returns.  I will leave foley in till tomorrow, get her OOB today.  Start ambulation tomorrow after she gets foley out.  Appreciate Medical management of all her issues.       LOS: 9 days    Brysan Mcevoy 01/20/2016 [phone]

## 2016-01-21 LAB — GLUCOSE, CAPILLARY
GLUCOSE-CAPILLARY: 189 mg/dL — AB (ref 65–99)
GLUCOSE-CAPILLARY: 176 mg/dL — AB (ref 65–99)
GLUCOSE-CAPILLARY: 175 mg/dL — AB (ref 65–99)
Glucose-Capillary: 161 mg/dL — ABNORMAL HIGH (ref 65–99)
Glucose-Capillary: 194 mg/dL — ABNORMAL HIGH (ref 65–99)

## 2016-01-21 MED ORDER — TRACE MINERALS CR-CU-MN-SE-ZN 10-1000-500-60 MCG/ML IV SOLN
INTRAVENOUS | Status: AC
  Administered 2016-01-21: 18:00:00 via INTRAVENOUS
  Filled 2016-01-21: qty 1992

## 2016-01-21 MED ORDER — INSULIN ASPART 100 UNIT/ML ~~LOC~~ SOLN
0.0000 [IU] | SUBCUTANEOUS | Status: DC
  Administered 2016-01-21 – 2016-01-23 (×11): 4 [IU] via SUBCUTANEOUS
  Administered 2016-01-23: 7 [IU] via SUBCUTANEOUS
  Administered 2016-01-23: 3 [IU] via SUBCUTANEOUS
  Administered 2016-01-23: 7 [IU] via SUBCUTANEOUS
  Administered 2016-01-23 – 2016-01-24 (×6): 4 [IU] via SUBCUTANEOUS
  Administered 2016-01-24: 3 [IU] via SUBCUTANEOUS
  Administered 2016-01-24 – 2016-01-25 (×2): 4 [IU] via SUBCUTANEOUS

## 2016-01-21 MED ORDER — FAMOTIDINE IN NACL 20-0.9 MG/50ML-% IV SOLN
20.0000 mg | Freq: Two times a day (BID) | INTRAVENOUS | Status: AC
  Administered 2016-01-21 – 2016-01-22 (×2): 20 mg via INTRAVENOUS
  Filled 2016-01-21 (×3): qty 50

## 2016-01-21 MED ORDER — FAT EMULSION 20 % IV EMUL
240.0000 mL | INTRAVENOUS | Status: AC
  Administered 2016-01-21: 240 mL via INTRAVENOUS
  Filled 2016-01-21: qty 250

## 2016-01-21 NOTE — Progress Notes (Signed)
2 Days Post-Op  Subjective: She looks good up in the chair just 2 hours yesterday.  Will try to improve on that.  D/c foley today, I have ask PT and OT to begin seeing today.  Begin some ambulation.  She if fairly comfortable, no flatus so far.  Objective: Vital signs in last 24 hours: Temp:  [97.4 F (36.3 C)-99.9 F (37.7 C)] 99.2 F (37.3 C) (06/03 0734) Pulse Rate:  [90-112] 103 (06/03 0734) Resp:  [17-23] 21 (06/03 0734) BP: (116-157)/(46-59) 128/47 mmHg (06/03 0734) SpO2:  [95 %-99 %] 98 % (06/03 0746) Weight:  [84.7 kg (186 lb 11.7 oz)] 84.7 kg (186 lb 11.7 oz) (06/03 0429) Last BM Date: 01/18/16 NPO/TNA NG 200 recorded Urine 1000 Afebrile, VSS, still tachycardic, No labs   NG place by fluroscopy Intake/Output from previous day: 06/02 0701 - 06/03 0700 In: 1450 [I.V.:20; TPN:1430] Out: 1200 [Urine:1000; Emesis/NG output:200] Intake/Output this shift: Total I/O In: -  Out: 150 [Urine:150]  General appearance: alert, cooperative and no distress Resp: clear to auscultation bilaterally GI: soft, sore, sites OK, few BS, No flatus or BM.    Lab Results:   Recent Labs  01/19/16 0452 01/20/16 0500  WBC 8.7 9.3  HGB 9.5* 9.3*  HCT 31.9* 30.4*  PLT 255 245    BMET  Recent Labs  01/19/16 0452 01/20/16 0500  NA 140 141  K 4.0 3.8  CL 107 107  CO2 26 27  GLUCOSE 132* 161*  BUN 7 9  CREATININE 0.79 0.84  CALCIUM 8.8* 8.5*   PT/INR No results for input(s): LABPROT, INR in the last 72 hours.   Recent Labs Lab 01/19/16 0452  AST 39  ALT 49  ALKPHOS 48  BILITOT 0.1*  PROT 5.3*  ALBUMIN 2.2*     Lipase     Component Value Date/Time   LIPASE 33 08/04/2013 0010     Studies/Results: No results found.  Medications: . arformoterol  15 mcg Nebulization BID   And  . umeclidinium bromide  1 puff Inhalation Daily  . aspirin  300 mg Rectal QHS  . azelastine  1 spray Each Nare BID  . clorazepate  15 mg Oral QHS  . ezetimibe  10 mg Oral Daily  .  furosemide  20 mg Intravenous Q48H  . heparin subcutaneous  5,000 Units Subcutaneous Q8H  . insulin aspart  0-20 Units Subcutaneous Q4H  . levothyroxine  12.5 mcg Intravenous Daily  . metoprolol  5 mg Intravenous Q6H  . montelukast  10 mg Oral QHS  . pantoprazole (PROTONIX) IV  40 mg Intravenous Q24H  . QUEtiapine  50 mg Oral Q breakfast  . rosuvastatin  20 mg Oral QHS  . sodium chloride flush  10-40 mL Intracatheter Q12H  . sodium chloride flush  3 mL Intravenous Q12H  . topiramate  25 mg Oral QHS    Assessment/Plan Partial small bowel Obstruction S/p Diagnostic laparoscopy converted to exploratory laparotomy, LYSIS OF ADHESIONS, 01/19/16, Dr. Greer Pickerel RLL pneumonia, community acquired with sepsis AODM type II CAD/minimally elevated troponin Acute Kidney injury/urinary retention Hypothyroid Hypertension Hx of asthma FEN: NPO/TNA ID: 8 days of Ceftriaxone/azithromycin completed 01/18/16; Cefotetan pre op 01/19/16 DVT: Heparin/SCD Anemia   Plan:  Mobilize her more, await bowel function, continue TNA.  I will recheck her labs on Monday.  She had to have IR place NG so I will keep it for now, not much coming out of it currently.      LOS: 10 days  Earnstine Regal 01/21/2016 [phone]

## 2016-01-21 NOTE — Progress Notes (Addendum)
Up to [facility], voided about 20cc.  Bladder scan showing 380-400cc Urine.  Will ask MD for I/O cath order.  Foley d/c at 1300 today.   At 2330 RN performed I/O cath.  550cc urine was obtained.  Will continue to monitor.

## 2016-01-21 NOTE — Progress Notes (Signed)
PROGRESS NOTE  [name]  HWE:[id number] DOB: 11/01/1924  DOA: 01/11/2016 PCP: [name], MD   Brief Narrative:  82 year old female patient with a PMH of asthma, CAD, chronic diastolic CHF, DM, hypothyroid, GERD, HTN, presented to [facility] ED on 01/11/16 with 3-4 day history of dyspnea and productive cough, failed outpatient azithromycin, admitted with diagnosis of sepsis secondary to LLL CAP. Completed treatment for pneumonia. Persistent PSBO despite conservative management for several days. Status post diagnostic laparoscopy converted to exploratory laparotomy, lysis of adhesions 01/19/16 and transferred to stepdown unit postoperatively. Ongoing postop ileus. Mobilizing.  Assessment & Plan:   Principal Problem:   Sepsis due to pneumonia [facility]) Active Problems:   Diabetes mellitus type 2, controlled ([place])   Coronary atherosclerosis   Asthma with bronchitis   HTN (hypertension)   HLD (hyperlipidemia)   Hypothyroid   GERD (gastroesophageal reflux disease)   Diabetes mellitus type 2 without retinopathy ([place])   Sepsis ([place])   Acute on chronic respiratory failure ([facility])   SBO (small bowel obstruction) (H[facility])   Hypokalemia   Preop cardiovascular exam   Sepsis due to Right lower lobe community-acquired pneumonia - Met sepsis criteria on admission. - Started empirically on Rocephin and azithromycin. Initially admitted to stepdown unit. - Blood cultures 2: Negative. Urine culture times one: Negative. Lactate: 1.9 > 1.2. Pro-calcitonin 0.26. - Has completed 8 days of IV antibiotics and antibiotics discontinued. - Repeat chest x-ray 5/29 shows resolution. - Sepsis resolved.  Partial Small bowel obstruction, S/P diagnostic laparoscopy converted to exploratory laparotomy, lysis of adhesions 01/19/16  - Gen. surgery was consulted. Patient failed conservative management. After cardiology preop clearance, patient underwent laparotomy and LOA on 01/19/16 - Management per general surgery: Bowel rest, NG  tube, TNA until bowel functions return. Ongoing postop ileus. Mobilizing.  Acute kidney injury  - Presented with creatinine of 2.49. Likely secondary to dehydration and intravascular volume depletion. Resolved.   Acute respiratory failure with hypoxia - Secondary to community-acquired pneumonia. Resolved. Aggressive pulmonary toilet, incentive spirometry and mobilization.  Anemia  - Stable.   Leukocytosis - Resolved   Type II DM - Good inpatient control. Continue SSI. Oral hypoglycemics held.  Chronic diastolic CHF - Compensated. Continue home dose Lasix.   Hypothyroid - Continue Synthroid . We will change meds to IV while she is nothing by mouth.  Essential hypertension - Controlled on metoprolol-changed to IV while nothing by mouth..  Hyperlipidemia -Statins -may hold while nothing by mouth.   GERD - PPI IV.  Anxiety & depression  - Continue home medications-may hold some medications while nothing by mouth. IV Ativan when necessary.Marland Kitchen   CAD - Minimally elevated troponin but flat trend. No chest pain reported. Likely demand ischemia related to acute illness. Aspirin resumed. Plavix held in case surgery needed.  - Cardiology input regarding preop clearance appreciated: Recommend proceeding with surgery without further ischemia evaluation realizing there is some increased risk.  Hypokalemia - Resolved.   Acute urinary retention - Has Foley catheter. Discontinuing Foley catheter 6/3. Check for voiding.  Asthma - Stable.  DVT prophylaxis: SCDs, subcutaneous heparin Code Status: Full Family Communication: Discussed with patient. No family at bedside. Disposition Plan: DC to SNF when medically stable. Postoperatively, patient was transferred from medical floor to stepdown unit on 01/19/16-continue stepdown.   Consultants:   General surgery  Cardiology.  Procedures:   NG tube  Foley catheter-DC 6/3.  Right upper arm PICC 01/19/16 >  Status post diagnostic  laparoscopy converted to exploratory laparotomy, lysis of  adhesions 01/19/16   Antimicrobials:   IV Rocephin 5/24 > 5/31   Azithromycin  5/24 > 5/31    Subjective: States that she was up in chair for 2 hours yesterday. No flatus or BM. Abdominal pain better. As per RN, no acute issues and no significant drainage through NG.  Objective:  Filed Vitals:   01/21/16 0615 01/21/16 0734 01/21/16 0746 01/21/16 1121  BP: 122/57 128/47  127/45  Pulse: 97 103  102  Temp:  99.2 F (37.3 C)  100 F (37.8 C)  TempSrc:  Oral  Oral  Resp: 19 21  14   Height:      Weight:      SpO2: 97% 98% 98% 98%    Intake/Output Summary (Last 24 hours) at 01/21/16 1206 Last data filed at 01/21/16 1203  Gross per 24 hour  Intake 1200.02 ml  Output   1800 ml  Net -599.98 ml   Filed Weights   01/19/16 0425 01/19/16 1759 01/21/16 0429  Weight: 83.553 kg (184 lb 3.2 oz) 85 kg (187 lb 6.3 oz) 84.7 kg (186 lb 11.7 oz)    Examination:  General exam: Small built and frail pleasant elderly female lying comfortably Supine in bed. Respiratory system: Slightly diminished breath sounds in the bases but otherwise clear to auscultation. Respiratory effort normal. Cardiovascular system: S1 & S2 heard, RRR. No JVD, murmurs, rubs, gallops or clicks. No pedal edema. Telemetry: SR with BBB morphology since 6/2 AM . Gastrointestinal system: Abdomen is non-distended, laparotomy area dressing clean and dry, mild appropriate postop tenderness without peritoneal signs. No organomegaly or masses felt. Diminished bowel sounds heard. Central nervous system: Alert and oriented. No focal neurological deficits. Extremities: Symmetric 5 x 5 power. Skin: No rashes, lesions or ulcers Psychiatry: Judgement and insight appear normal. Mood & affect appropriate.     Data Reviewed: I have personally reviewed following labs and imaging studies  CBC:  Recent Labs Lab 01/16/16 0322 01/19/16 0452 01/20/16 0500  WBC 8.2 8.7 9.3    NEUTROABS  --  6.0  --   HGB 9.0* 9.5* 9.3*  HCT 30.0* 31.9* 30.4*  MCV 81.7 81.8 81.5  PLT 273 255 694   Basic Metabolic Panel:  Recent Labs Lab 01/16/16 0322 01/17/16 0424 01/18/16 0523 01/19/16 0452 01/20/16 0500  NA 140 140 140 140 141  K 3.5 3.5 3.8 4.0 3.8  CL 111 109 108 107 107  CO2 23 24 26 26 27   GLUCOSE 165* 156* 142* 132* 161*  BUN 10 8 7 7 9   CREATININE 0.64 0.64 0.79 0.79 0.84  CALCIUM 8.4* 8.8* 8.8* 8.8* 8.5*  MG  --   --   --  1.8 2.0  PHOS  --   --   --  4.2 3.9   GFR: Estimated Creatinine Clearance: 45.7 mL/min (by C-G formula based on Cr of 0.84). Liver Function Tests:  Recent Labs Lab 01/19/16 0452  AST 39  ALT 49  ALKPHOS 48  BILITOT 0.1*  PROT 5.3*  ALBUMIN 2.2*   No results for input(s): LIPASE, AMYLASE in the last 168 hours. No results for input(s): AMMONIA in the last 168 hours. Coagulation Profile: No results for input(s): INR, PROTIME in the last 168 hours. Cardiac Enzymes: No results for input(s): CKTOTAL, CKMB, CKMBINDEX, TROPONINI in the last 168 hours. BNP (last 3 results) No results for input(s): PROBNP in the last 8760 hours. HbA1C: No results for input(s): HGBA1C in the last 72 hours. CBG:  Recent Labs Lab 01/20/16  1647 01/20/16 1933 01/20/16 2315 01/21/16 0428 01/21/16 0833  GLUCAP 137* 164* 208* 161* 189*   Lipid Profile:  Recent Labs  01/19/16 0453  TRIG 173*   Thyroid Function Tests: No results for input(s): TSH, T4TOTAL, FREET4, T3FREE, THYROIDAB in the last 72 hours. Anemia Panel: No results for input(s): VITAMINB12, FOLATE, FERRITIN, TIBC, IRON, RETICCTPCT in the last 72 hours.  Sepsis Labs: No results for input(s): PROCALCITON, LATICACIDVEN in the last 168 hours.  Recent Results (from the past 240 hour(s))  MRSA PCR Screening     Status: None   Collection Time: 01/11/16  6:00 PM  Result Value Ref Range Status   MRSA by PCR NEGATIVE NEGATIVE Final    Comment:        The GeneXpert MRSA Assay  (FDA approved for NASAL specimens only), is one component of a comprehensive MRSA colonization surveillance program. It is not intended to diagnose MRSA infection nor to guide or monitor treatment for MRSA infections.   Surgical pcr screen     Status: None   Collection Time: 01/19/16 11:59 AM  Result Value Ref Range Status   MRSA, PCR NEGATIVE NEGATIVE Final   Staphylococcus aureus NEGATIVE NEGATIVE Final    Comment:        The Xpert SA Assay (FDA approved for NASAL specimens in patients over 52 years of age), is one component of a comprehensive surveillance program.  Test performance has been validated by [facility] for patients greater than or equal to 41 year old. It is not intended to diagnose infection nor to guide or monitor treatment.          Radiology Studies: No results found.      Scheduled Meds: . arformoterol  15 mcg Nebulization BID   And  . umeclidinium bromide  1 puff Inhalation Daily  . aspirin  300 mg Rectal QHS  . azelastine  1 spray Each Nare BID  . clorazepate  15 mg Oral QHS  . ezetimibe  10 mg Oral Daily  . furosemide  20 mg Intravenous Q48H  . heparin subcutaneous  5,000 Units Subcutaneous Q8H  . insulin aspart  0-20 Units Subcutaneous Q4H  . levothyroxine  12.5 mcg Intravenous Daily  . metoprolol  5 mg Intravenous Q6H  . montelukast  10 mg Oral QHS  . pantoprazole (PROTONIX) IV  40 mg Intravenous Q24H  . QUEtiapine  50 mg Oral Q breakfast  . rosuvastatin  20 mg Oral QHS  . sodium chloride flush  10-40 mL Intracatheter Q12H  . sodium chloride flush  3 mL Intravenous Q12H  . topiramate  25 mg Oral QHS   Continuous Infusions: . Marland KitchenTPN (CLINIMIX-E) Adult 70 mL/hr at 01/20/16 1900   And  . fat emulsion 240 mL (01/20/16 1900)  . Marland KitchenTPN (CLINIMIX-E) Adult     And  . fat emulsion       LOS: 10 days   Time spent: 20 minutes.   [facility], MD [facility] Pager [phone]  If 7PM-7AM, please contact  night-coverage [email or web address] Password [email or web address] 01/21/2016, 12:06 PM

## 2016-01-21 NOTE — Progress Notes (Signed)
PARENTERAL NUTRITION CONSULT NOTE - INITIAL  Pharmacy Consult for TPN Indication: Bowel Obstruction   No Known Allergies  Patient Measurements: Height: 5\' 1"  (154.9 cm) Weight: 186 lb 11.7 oz (84.7 kg) IBW/kg (Calculated) : 47.8   Insulin Requirements in the past 24 hours:  17 units of moderate SSI   Assessment: 32 YOF who presented with shortness of breath and developed epigastric and RUQ pain. A CT scan on 5/25 showed a high grade SBO. An NGT was placed on 5/26 and SBO protocol was initiated with bowel rest. Initially was improving and diet was advanced but unfortunately, patient became bloated again and stopped having BM's since 5/29. Diet was reversed to full liquids and pharmacy was consulted to start TPN.   Surgeries/Procedures: None   GI: Currently at 1 week of medical management. Went to OR for ex lap on 6/1. Found to have adhesive band going over top of loop of small bowel causing the obstruction. Has been on and off full liquid / soft diet since admission with poor intake. Looks good today but has not had any flatus.  NG tube had 224ml of output yesterday. Albumin is 2.2, prealbumin 14.7. On oral PPI   Endo: DM on glimiperide at home, hypothyroid. CBGs somewhat controlled (130-200s) on moderate SSI  Lytes: wnl yesterday including K 3.8, Mg 2.0, Phos 3.9 Renal: SCr stable, CrCl ~35ml/min Pulm: Asthma, on RA Cards: CAD, HTN. BP ok and HR tachy on asa, zetia, metoprolol, crestor, lasix Hepatobil: TG 173 at baseline, LFTs wnl. Tbili 0.1. Neuro: Anxiety, on Seroquel, clorazepate, Topamax ID: [id number] wnl  Best Practices: SQ heparin  TPN Access: PICC to be placed today TPN start date: 6/1  Nutritional Goals: per RD recs on 6/1 Kcal: 1700-1900 Protein: 100-120 g  Current Nutrition:  NPO Clinimix E 5/15 at 33ml/hr IV 20% lipid emulsions at 15ml/hr  Plan:  Increase Clinimix E 5/15 to 32ml/hr Continue 20% lipid emulsion at 44ml/hr This provides 100 g of protein and 1894  kCals per day meeting 100% of protein and kCal needs Continue MVI and TE in TPN Increase to resistant SSI and adjust as needed Monitor TPN labs,  F/U advancement of diet  Elenor Quinones, PharmD, [facility] Clinical Pharmacist Pager [phone] 01/21/2016 8:01 AM

## 2016-01-22 LAB — GLUCOSE, CAPILLARY
GLUCOSE-CAPILLARY: 158 mg/dL — AB (ref 65–99)
GLUCOSE-CAPILLARY: 173 mg/dL — AB (ref 65–99)
GLUCOSE-CAPILLARY: 186 mg/dL — AB (ref 65–99)
GLUCOSE-CAPILLARY: 195 mg/dL — AB (ref 65–99)
Glucose-Capillary: 187 mg/dL — ABNORMAL HIGH (ref 65–99)
Glucose-Capillary: 172 mg/dL — ABNORMAL HIGH (ref 65–99)

## 2016-01-22 LAB — BASIC METABOLIC PANEL
Anion gap: 7 (ref 5–15)
BUN: 19 mg/dL (ref 6–20)
CHLORIDE: 103 mmol/L (ref 101–111)
CO2: 23 mmol/L (ref 22–32)
CREATININE: 0.67 mg/dL (ref 0.44–1.00)
Calcium: 8.5 mg/dL — ABNORMAL LOW (ref 8.9–10.3)
Glucose, Bld: 656 mg/dL (ref 65–99)
Potassium: 5.1 mmol/L (ref 3.5–5.1)
SODIUM: 133 mmol/L — AB (ref 135–145)

## 2016-01-22 LAB — CBC
HCT: 26.5 % — ABNORMAL LOW (ref 36.0–46.0)
HEMOGLOBIN: 8.3 g/dL — AB (ref 12.0–15.0)
MCH: 25.9 pg — ABNORMAL LOW (ref 26.0–34.0)
MCHC: 31.3 g/dL (ref 30.0–36.0)
MCV: 82.8 fL (ref 78.0–100.0)
PLATELETS: 258 10*3/uL (ref 150–400)
RBC: 3.2 MIL/uL — AB (ref 3.87–5.11)
RDW: 15.1 % (ref 11.5–15.5)
WBC: 11.8 10*3/uL — ABNORMAL HIGH (ref 4.0–10.5)

## 2016-01-22 MED ORDER — FAT EMULSION 20 % IV EMUL
240.0000 mL | INTRAVENOUS | Status: AC
  Administered 2016-01-22: 240 mL via INTRAVENOUS
  Filled 2016-01-22: qty 250

## 2016-01-22 MED ORDER — CLINIMIX E/DEXTROSE (5/15) 5 % IV SOLN
INTRAVENOUS | Status: AC
  Administered 2016-01-22: 17:00:00 via INTRAVENOUS
  Filled 2016-01-22: qty 1992

## 2016-01-22 MED ORDER — ACETAMINOPHEN 325 MG PO TABS
650.0000 mg | ORAL_TABLET | Freq: Four times a day (QID) | ORAL | Status: DC | PRN
  Filled 2016-01-22: qty 2

## 2016-01-22 NOTE — Progress Notes (Signed)
Pt attempted to void without success. Bladder scanned her for 429 ml at 0550. Pt I and O cathed at 0645 for 500 ml of clear yellow urine. Pt tolerated well. Reginia Naas, NP aware of I and O cath.

## 2016-01-22 NOTE — Progress Notes (Signed)
PARENTERAL NUTRITION CONSULT NOTE - INITIAL  Pharmacy Consult for TPN Indication: Bowel Obstruction   No Known Allergies  Patient Measurements: Height: 5\' 1"  (154.9 cm) Weight: 186 lb 11.7 oz (84.7 kg) IBW/kg (Calculated) : 47.8   Insulin Requirements in the past 24 hours:  23 units of resistant SSI   Assessment: [name] who presented with shortness of breath and developed epigastric and RUQ pain. A CT scan on 5/25 showed a high grade SBO. An NGT was placed on 5/26 and SBO protocol was initiated with bowel rest. Initially was improving and diet was advanced but unfortunately, patient became bloated again and stopped having BM's since 5/29. Diet was reversed to full liquids and pharmacy was consulted to start TPN.   Surgeries/Procedures: None   GI: Currently at 1 week of medical management. Went to OR for ex lap on 6/1. Found to have adhesive band going over top of loop of small bowel causing the obstruction. Has been on and off full liquid / soft diet since admission with poor intake. Looks good today but has not had any flatus.  NG tube is down to only 23ml of output yesteday. Albumin is 2.2, prealbumin 14.7. On oral PPI   Endo: DM on glimiperide at home, hypothyroid. CBGs somewhat controlled (170-190s) on resistant SSI  Lytes: wnl yesterday including K 3.8, Mg 2.0, Phos 3.9 Renal: SCr stable, CrCl ~34ml/min. UOP up to 1.87ml/kg/hr yesterday. Pulm: Asthma, on RA Cards: CAD, HTN. BP ok and HR tachy on asa, zetia, metoprolol, crestor, lasix Hepatobil: TG 173 at baseline, LFTs wnl. Tbili 0.1. Neuro: Anxiety, on Seroquel, clorazepate, Topamax ID: [id number]  Best Practices: SQ heparin  TPN Access: PICC to be placed today TPN start date: 6/1  Nutritional Goals: per RD recs on 6/1 Kcal: 1700-1900 Protein: 100-120 g  Current Nutrition:  NPO Clinimix E 5/15 at 72ml/hr IV 20% lipid emulsions at 56ml/hr  Plan:  Continue Clinimix E 5/15 at 60ml/hr Continue 20% lipid emulsion at  49ml/hr This provides 100 g of protein and 1894 kCals per day meeting 100% of protein and kCal needs Continue MVI and TE in TPN Add famotidine 40mg  to TPN and stop IV Pepcid Q12 Add 15 units of regular insulin to TPN Continue resistant SSI and adjust as needed Monitor TPN labs F/U advancement of diet  Elenor Quinones, PharmD, [facility] Clinical Pharmacist Pager [phone] 01/22/2016 7:51 AM

## 2016-01-22 NOTE — Progress Notes (Signed)
PROGRESS NOTE  [name]  [id number] DOB: [birth year]  DOA: 01/11/2016 PCP: [name], MD   Brief Narrative:  82 year old female patient with a PMH of asthma, CAD, chronic diastolic CHF, DM, hypothyroid, GERD, HTN, presented to [facility] ED on 01/11/16 with 3-4 day history of dyspnea and productive cough, failed outpatient azithromycin, admitted with diagnosis of sepsis secondary to LLL CAP. Completed treatment for pneumonia. Persistent PSBO despite conservative management for several days. Status post diagnostic laparoscopy converted to exploratory laparotomy, lysis of adhesions 01/19/16 and transferred to stepdown unit postoperatively. Ongoing postop ileus. Mobilizing.  Assessment & Plan:   Principal Problem:   Sepsis due to pneumonia [facility]) Active Problems:   Diabetes mellitus type 2, controlled ([place])   Coronary atherosclerosis   Asthma with bronchitis   HTN (hypertension)   HLD (hyperlipidemia)   Hypothyroid   GERD (gastroesophageal reflux disease)   Diabetes mellitus type 2 without retinopathy ([place])   Sepsis ([place])   Acute on chronic respiratory failure ([facility])   SBO (small bowel obstruction) ([facility])   Hypokalemia   Preop cardiovascular exam   Sepsis due to Right lower lobe community-acquired pneumonia - Met sepsis criteria on admission. - Started empirically on Rocephin and azithromycin. Initially admitted to stepdown unit. - Blood cultures 2: Negative. Urine culture times one: Negative. Lactate: 1.9 > 1.2. Pro-calcitonin 0.26. - Has completed 8 days of IV antibiotics and antibiotics discontinued. - Repeat chest x-ray 5/29 shows resolution. - Sepsis resolved.  Partial Small bowel obstruction, S/P diagnostic laparoscopy converted to exploratory laparotomy, lysis of adhesions 01/19/16  - Gen. surgery was consulted. Patient failed conservative management. After cardiology preop clearance, patient underwent laparotomy and LOA on 01/19/16 - Management per general surgery: Bowel rest, NG  tube, TNA until bowel functions return. Ongoing postop ileus. Mobilizing. Surgery planning clamping trials.  Acute kidney injury  - Presented with creatinine of 2.49. Likely secondary to dehydration and intravascular volume depletion. Resolved.   Acute respiratory failure with hypoxia - Secondary to community-acquired pneumonia. Resolved. Aggressive pulmonary toilet, incentive spirometry and mobilization.  Anemia  - Stable.   Leukocytosis - Resolved   Type II DM - Good inpatient control. Continue SSI. Oral hypoglycemics held.  Chronic diastolic CHF - Compensated. Continue home dose Lasix.   Hypothyroid - Continue Synthroid . We will change meds to IV while she is nothing by mouth.  Essential hypertension - Controlled on metoprolol-changed to IV while nothing by mouth..  Hyperlipidemia -Statins -may hold while nothing by mouth.   GERD - PPI IV.  Anxiety & depression  - Continue home medications-may hold some medications while nothing by mouth. IV Ativan when necessary.Marland Kitchen   CAD - Minimally elevated troponin but flat trend. No chest pain reported. Likely demand ischemia related to acute illness. Aspirin resumed. Plavix held in case surgery needed.  - Cardiology input regarding preop clearance appreciated: Recommend proceeding with surgery without further ischemia evaluation realizing there is some increased risk.  Hypokalemia - Resolved.   Acute urinary retention - Foley catheter was discontinued on 6/3 but patient having difficulty voiding and in and out cath has been done a couple times since. If continues, would place back Foley catheter for several days until acute issues resolve then may consider voiding trial and it's possible that patient may have to discharge with Foley and outpatient follow-up with urology. History of bladder tack years ago with Dr. Rosana Hoes, Urology. General surgery holding off on Foley for now.  Asthma - Stable.  Chronic LBBB    DVT  prophylaxis: SCDs, subcutaneous heparin Code Status: Full Family Communication: Discussed with patient. No family at bedside. Disposition Plan: DC to SNF when medically stable. Postoperatively, patient was transferred from medical floor to stepdown unit on 01/19/16-continue stepdown.   Consultants:   General surgery  Cardiology.  Procedures:   NG tube  Foley catheter-DC 6/3. In and out cath when necessary  Right upper arm PICC 01/19/16 >  Status post diagnostic laparoscopy converted to exploratory laparotomy, lysis of adhesions 01/19/16   Antimicrobials:   IV Rocephin 5/24 > 5/31   Azithromycin  5/24 > 5/31    Subjective: Mild abdominal pain. Feels crumbling in the stomach but no flatus yet. Was out of bed to chair for 2 hours yesterday.  Objective:  Filed Vitals:   01/22/16 0442 01/22/16 0620 01/22/16 0708 01/22/16 0937  BP: 139/39 136/52 132/42   Pulse: 107 109 102   Temp: 98.7 F (37.1 C)  98.1 F (36.7 C)   TempSrc: Oral  Oral   Resp: _0 Height:      Weight:      SpO2: 97% 98% 100% 97%    Intake/Output Summary (Last 24 hours) at 01/22/16 1141 Last data filed at 01/22/16 0700  Gross per 24 hour  Intake 2227.65 ml  Output   2325 ml  Net -97.35 ml   Filed Weights   01/19/16 0425 01/19/16 1759 01/21/16 0429  Weight: 83.553 kg (184 lb 3.2 oz) 85 kg (187 lb 6.3 oz) 84.7 kg (186 lb 11.7 oz)    Examination:  General exam: Small built and frail pleasant elderly female lying comfortably supine in bed. Respiratory system: Slightly diminished breath sounds in the bases but otherwise clear to auscultation. Respiratory effort normal. Cardiovascular system: S1 & S2 heard, RRR. No JVD, murmurs, rubs, gallops or clicks. No pedal edema. Telemetry: ST with BBB morphology in the low 100s. Gastrointestinal system: Abdomen is non-distended, laparotomy area dressing clean and dry, mild appropriate postop tenderness without peritoneal signs. No organomegaly or masses  felt. Improved bowel sounds.. Central nervous system: Alert and oriented. No focal neurological deficits. Extremities: Symmetric 5 x 5 power. Skin: No rashes, lesions or ulcers Psychiatry: Judgement and insight appear normal. Mood & affect appropriate.     Data Reviewed: I have personally reviewed following labs and imaging studies  CBC:  Recent Labs Lab 01/16/16 0322 01/19/16 0452 01/20/16 0500  WBC 8.2 8.7 9.3  NEUTROABS  --  6.0  --   HGB 9.0* 9.5* 9.3*  HCT 30.0* 31.9* 30.4*  MCV 81.7 81.8 81.5  PLT 273 255 564   Basic Metabolic Panel:  Recent Labs Lab 01/16/16 0322 01/17/16 0424 01/18/16 0523 01/19/16 0452 01/20/16 0500  NA 140 140 140 140 141  K 3.5 3.5 3.8 4.0 3.8  CL 111 109 108 107 107  CO2 _1 GLUCOSE 165* 156* 142* 132* 161*  BUN _2 CREATININE 0.64 0.64 0.79 0.79 0.84  CALCIUM 8.4* 8.8* 8.8* 8.8* 8.5*  MG  --   --   --  1.8 2.0  PHOS  --   --   --  4.2 3.9   GFR: Estimated Creatinine Clearance: 45.7 mL/min (by C-G formula based on Cr of 0.84). Liver Function Tests:  Recent Labs Lab 01/19/16 0452  AST 39  ALT 49  ALKPHOS 48  BILITOT 0.1*  PROT 5.3*  ALBUMIN 2.2*   No results for input(s): LIPASE, AMYLASE in the last 168  hours. No results for input(s): AMMONIA in the last 168 hours. Coagulation Profile: No results for input(s): INR, PROTIME in the last 168 hours. Cardiac Enzymes: No results for input(s): CKTOTAL, CKMB, CKMBINDEX, TROPONINI in the last 168 hours. BNP (last 3 results) No results for input(s): PROBNP in the last 8760 hours. HbA1C: No results for input(s): HGBA1C in the last 72 hours. CBG:  Recent Labs Lab 01/21/16 1540 01/21/16 1936 01/21/16 2356 01/22/16 0441 01/22/16 0730  GLUCAP 176* 175* 186* 187* 195*   Lipid Profile: No results for input(s): CHOL, HDL, LDLCALC, TRIG, CHOLHDL, LDLDIRECT in the last 72 hours. Thyroid Function Tests: No results for input(s): TSH, T4TOTAL, FREET4, T3FREE,  THYROIDAB in the last 72 hours. Anemia Panel: No results for input(s): VITAMINB12, FOLATE, FERRITIN, TIBC, IRON, RETICCTPCT in the last 72 hours.  Sepsis Labs: No results for input(s): PROCALCITON, LATICACIDVEN in the last 168 hours.  Recent Results (from the past 240 hour(s))  Surgical pcr screen     Status: None   Collection Time: 01/19/16 11:59 AM  Result Value Ref Range Status   MRSA, PCR NEGATIVE NEGATIVE Final   Staphylococcus aureus NEGATIVE NEGATIVE Final    Comment:        The Xpert SA Assay (FDA approved for NASAL specimens in patients over 44 years of age), is one component of a comprehensive surveillance program.  Test performance has been validated by [facility] for patients greater than or equal to 39 year old. It is not intended to diagnose infection nor to guide or monitor treatment.          Radiology Studies: No results found.      Scheduled Meds: . arformoterol  15 mcg Nebulization BID   And  . umeclidinium bromide  1 puff Inhalation Daily  . aspirin  300 mg Rectal QHS  . azelastine  1 spray Each Nare BID  . clorazepate  15 mg Oral QHS  . ezetimibe  10 mg Oral Daily  . furosemide  20 mg Intravenous Q48H  . heparin subcutaneous  5,000 Units Subcutaneous Q8H  . insulin aspart  0-20 Units Subcutaneous Q4H  . levothyroxine  12.5 mcg Intravenous Daily  . metoprolol  5 mg Intravenous Q6H  . montelukast  10 mg Oral QHS  . QUEtiapine  50 mg Oral Q breakfast  . rosuvastatin  20 mg Oral QHS  . sodium chloride flush  10-40 mL Intracatheter Q12H  . sodium chloride flush  3 mL Intravenous Q12H  . topiramate  25 mg Oral QHS   Continuous Infusions: . Marland KitchenTPN (CLINIMIX-E) Adult 83 mL/hr at 01/22/16 0700   And  . fat emulsion 240 mL (01/22/16 0700)  . Marland KitchenTPN (CLINIMIX-E) Adult     And  . fat emulsion       LOS: 11 days   Time spent: 20 minutes.   [facility], MD [facility] Pager [phone]  If 7PM-7AM, please contact  night-coverage [email or web address] Password [email or web address] 01/22/2016, 11:41 AM

## 2016-01-22 NOTE — Progress Notes (Signed)
3 Days Post-Op  Subjective: Unable to void, had to be I/O cath x 2 since yesterday, no flatus, walked to chair only. Hard to get her to do stuff per the nurse.    Objective: Vital signs in last 24 hours: Temp:  [97.9 F (36.6 C)-100 F (37.8 C)] 98.1 F (36.7 C) (06/04 0708) Pulse Rate:  [102-110] 102 (06/04 0708) Resp:  [14-20] 19 (06/04 0708) BP: (127-139)/(39-52) 132/42 mmHg (06/04 0708) SpO2:  [92 %-100 %] 100 % (06/04 0708) Last BM Date: 01/18/16 NPO Urine 2425  50  from the NG Afebrile, VSS tachy at times I will recheck labs this AM.   NG per Fluro last film   Intake/Output from previous day: 06/03 0701 - 06/04 0700 In: 2227.7 [IV Piggyback:100; TPN:2047.7] Out: [id number] [Urine:2425; Emesis/NG output:50] Intake/Output this shift:    General appearance: alert, cooperative and no distress Resp: clear to auscultation bilaterally GI: soft, few BS, incision looks fine, no flatus.  Lab Results:   Recent Labs  01/20/16 0500  WBC 9.3  HGB 9.3*  HCT 30.4*  PLT 245    BMET  Recent Labs  01/20/16 0500  NA 141  K 3.8  CL 107  CO2 27  GLUCOSE 161*  BUN 9  CREATININE 0.84  CALCIUM 8.5*   PT/INR No results for input(s): LABPROT, INR in the last 72 hours.   Recent Labs Lab 01/19/16 0452  AST 39  ALT 49  ALKPHOS 48  BILITOT 0.1*  PROT 5.3*  ALBUMIN 2.2*     Lipase     Component Value Date/Time   LIPASE 33 08/04/2013 0010     Studies/Results: No results found.  Medications: . arformoterol  15 mcg Nebulization BID   And  . umeclidinium bromide  1 puff Inhalation Daily  . aspirin  300 mg Rectal QHS  . azelastine  1 spray Each Nare BID  . clorazepate  15 mg Oral QHS  . ezetimibe  10 mg Oral Daily  . famotidine (PEPCID) IV  20 mg Intravenous Q12H  . furosemide  20 mg Intravenous Q48H  . heparin subcutaneous  5,000 Units Subcutaneous Q8H  . insulin aspart  0-20 Units Subcutaneous Q4H  . levothyroxine  12.5 mcg Intravenous Daily  . metoprolol   5 mg Intravenous Q6H  . montelukast  10 mg Oral QHS  . QUEtiapine  50 mg Oral Q breakfast  . rosuvastatin  20 mg Oral QHS  . sodium chloride flush  10-40 mL Intracatheter Q12H  . sodium chloride flush  3 mL Intravenous Q12H  . topiramate  25 mg Oral QHS    Assessment/Plan Partial small bowel Obstruction S/p Diagnostic laparoscopy converted to exploratory laparotomy, LYSIS OF ADHESIONS, 01/19/16, Dr. Greer Pickerel RLL pneumonia, community acquired with sepsis AODM type II CAD/minimally elevated troponin Acute Kidney injury/urinary retention Hypothyroid Hypertension Hx of asthma FEN: NPO/TNA ID: 8 days of Ceftriaxone/azithromycin completed 01/18/16; Cefotetan pre op 01/19/16 DVT: Heparin/SCD Anemia    Plan:  I told them to get her up more, and walk in the halls today.  I ask them to hold off on foley for now.  She put out 2.4 liters yesterday.  I am getting nothing from the NG and it's working.  I will try clamping trails.       LOS: 11 days    Tish Begin 01/22/2016 [phone]

## 2016-01-23 DIAGNOSIS — D62 Acute posthemorrhagic anemia: Secondary | ICD-10-CM

## 2016-01-23 LAB — COMPREHENSIVE METABOLIC PANEL
ALT: 24 U/L (ref 14–54)
AST: 24 U/L (ref 15–41)
Albumin: 1.9 g/dL — ABNORMAL LOW (ref 3.5–5.0)
Alkaline Phosphatase: 43 U/L (ref 38–126)
Anion gap: 7 (ref 5–15)
BUN: 20 mg/dL (ref 6–20)
CHLORIDE: 108 mmol/L (ref 101–111)
CO2: 27 mmol/L (ref 22–32)
Calcium: 8.4 mg/dL — ABNORMAL LOW (ref 8.9–10.3)
Creatinine, Ser: 0.64 mg/dL (ref 0.44–1.00)
Glucose, Bld: 167 mg/dL — ABNORMAL HIGH (ref 65–99)
POTASSIUM: 3.8 mmol/L (ref 3.5–5.1)
SODIUM: 142 mmol/L (ref 135–145)
Total Bilirubin: 0.3 mg/dL (ref 0.3–1.2)
Total Protein: 4.9 g/dL — ABNORMAL LOW (ref 6.5–8.1)

## 2016-01-23 LAB — CBC
HCT: 24.4 % — ABNORMAL LOW (ref 36.0–46.0)
HEMOGLOBIN: 7.5 g/dL — AB (ref 12.0–15.0)
MCH: 25 pg — ABNORMAL LOW (ref 26.0–34.0)
MCHC: 30.7 g/dL (ref 30.0–36.0)
MCV: 81.3 fL (ref 78.0–100.0)
PLATELETS: 237 10*3/uL (ref 150–400)
RBC: 3 MIL/uL — AB (ref 3.87–5.11)
RDW: 15.1 % (ref 11.5–15.5)
WBC: 8.3 10*3/uL (ref 4.0–10.5)

## 2016-01-23 LAB — GLUCOSE, CAPILLARY
GLUCOSE-CAPILLARY: 177 mg/dL — AB (ref 65–99)
GLUCOSE-CAPILLARY: 203 mg/dL — AB (ref 65–99)
GLUCOSE-CAPILLARY: 204 mg/dL — AB (ref 65–99)
GLUCOSE-CAPILLARY: 145 mg/dL — AB (ref 65–99)
GLUCOSE-CAPILLARY: 166 mg/dL — AB (ref 65–99)
Glucose-Capillary: 158 mg/dL — ABNORMAL HIGH (ref 65–99)
Glucose-Capillary: 200 mg/dL — ABNORMAL HIGH (ref 65–99)

## 2016-01-23 LAB — PREALBUMIN: PREALBUMIN: 9.6 mg/dL — AB (ref 18–38)

## 2016-01-23 LAB — DIFFERENTIAL
Basophils Absolute: 0 10*3/uL (ref 0.0–0.1)
Basophils Relative: 0 %
Eosinophils Absolute: 0.6 10*3/uL (ref 0.0–0.7)
Eosinophils Relative: 7 %
LYMPHS PCT: 18 %
Lymphs Abs: 1.5 10*3/uL (ref 0.7–4.0)
MONO ABS: 0.9 10*3/uL (ref 0.1–1.0)
Monocytes Relative: 11 %
NEUTROS ABS: 5.3 10*3/uL (ref 1.7–7.7)
Neutrophils Relative %: 64 %

## 2016-01-23 LAB — TRIGLYCERIDES: TRIGLYCERIDES: 175 mg/dL — AB (ref <150)

## 2016-01-23 LAB — MAGNESIUM: Magnesium: 1.9 mg/dL (ref 1.7–2.4)

## 2016-01-23 LAB — PHOSPHORUS: Phosphorus: 4.5 mg/dL (ref 2.5–4.6)

## 2016-01-23 MED ORDER — FAT EMULSION 20 % IV EMUL
240.0000 mL | INTRAVENOUS | Status: AC
  Administered 2016-01-23: 240 mL via INTRAVENOUS
  Filled 2016-01-23: qty 250

## 2016-01-23 MED ORDER — TRACE MINERALS CR-CU-MN-SE-ZN 10-1000-500-60 MCG/ML IV SOLN
INTRAVENOUS | Status: AC
  Administered 2016-01-23: 19:00:00 via INTRAVENOUS
  Filled 2016-01-23: qty 1992

## 2016-01-23 MED ORDER — BOOST / RESOURCE BREEZE PO LIQD: 1.0000 | Freq: Two times a day (BID) | ORAL | Status: DC

## 2016-01-23 MED ORDER — CETYLPYRIDINIUM CHLORIDE 0.05 % MT LIQD
7.0000 mL | Freq: Two times a day (BID) | OROMUCOSAL | Status: DC
  Administered 2016-01-23 – 2016-02-02 (×19): 7 mL via OROMUCOSAL

## 2016-01-23 MED ORDER — OXYCODONE HCL 5 MG PO TABS
5.0000 mg | ORAL_TABLET | ORAL | Status: DC | PRN
  Administered 2016-01-23 – 2016-01-30 (×17): 5 mg via ORAL
  Filled 2016-01-23 (×18): qty 1

## 2016-01-23 MED ORDER — TAMSULOSIN HCL 0.4 MG PO CAPS
0.4000 mg | ORAL_CAPSULE | Freq: Every day | ORAL | Status: DC
  Administered 2016-01-23 – 2016-02-02 (×11): 0.4 mg via ORAL
  Filled 2016-01-23 (×11): qty 1

## 2016-01-23 MED ORDER — ACETAMINOPHEN 325 MG PO TABS
650.0000 mg | ORAL_TABLET | Freq: Four times a day (QID) | ORAL | Status: DC
  Administered 2016-01-23 – 2016-02-02 (×36): 650 mg via ORAL
  Filled 2016-01-23 (×37): qty 2

## 2016-01-23 NOTE — Progress Notes (Signed)
Occupational Therapy Evaluation Patient Details Name: [name] MRN: [id number] DOB: [birth year] Today's Date: 01/23/2016    History of Present Illness 82 y.o. female adm with 3-4d of SOB, also with abd distension, found to have SBO d/y adhesion;    PMHx: Asthma not on home oxygen, CAD, DM2, hypothyroidism, GERD, CAD, OA, R TKA.  Pt s/p exploratory laparotomy due to partial SBO on 01/19/16.     Clinical Impression   PTA, pt lived at home with niece and was mod I with ADL and mobility @ RW level. Pt presents with significant functional decline, requiring Min a for mobility and Mod A for ADL @ RW level. Limited by fatigue and pain. Encouraged pt/son to complete BUE AROM exercises while OOB. Will follow acutely to address established goals and facilitate D/C to SNF for rehab to facilitate safe return [facility].    Follow Up Recommendations  SNF;Supervision/Assistance - 24 hour    Equipment Recommendations  Other (comment) (TBD at SNF)    Recommendations for Other Services       Precautions / Restrictions Precautions Precautions: Fall Precaution Comments: NGT, monitor HR      Mobility Bed Mobility Overal bed mobility: Needs Assistance Bed Mobility: Supine to Sit   Sidelying to sit: Min assist          Transfers Overall transfer level: Needs assistance Equipment used: Rolling walker (2 wheeled) Transfers: Sit to/from Stand Sit to Stand: Min assist              Balance Overall balance assessment: Needs assistance   Sitting balance-Leahy Scale: Fair       Standing balance-Leahy Scale: Fair                              ADL Overall ADL's : Needs assistance/impaired Eating/Feeding: Set up Eating/Feeding Details (indicate cue type and reason): starting clear liquids Grooming: Set up;Sitting   Upper Body Bathing: Set up;Sitting   Lower Body Bathing: Moderate assistance;Sit to/from stand   Upper Body Dressing : Minimal assistance;Sitting    Lower Body Dressing: Moderate assistance;Sit to/from stand   Toilet Transfer: Minimal assistance;RW;BSC   Toileting- Water quality scientist and Hygiene: Min guard       Functional mobility during ADLs: Minimal assistance;Rolling walker General ADL Comments: Moves very slowly due to apparent pain and generalized weakness     Vision     Perception     Praxis      Pertinent Vitals/Pain Pain Assessment: Faces Faces Pain Scale: Hurts even more Pain Location: stomach Pain Descriptors / Indicators: Cramping;Guarding;Grimacing Pain Intervention(s): Limited activity within patient's tolerance     Hand Dominance Right   Extremity/Trunk Assessment Upper Extremity Assessment Upper Extremity Assessment: Generalized weakness   Lower Extremity Assessment Lower Extremity Assessment: Generalized weakness;Defer to PT evaluation   Cervical / Trunk Assessment Cervical / Trunk Assessment: Normal   Communication Communication Communication: No difficulties   Cognition Arousal/Alertness: Awake/alert Behavior During Therapy: WFL for tasks assessed/performed Overall Cognitive Status: Within Functional Limits for tasks assessed                     General Comments       Exercises Exercises: General Upper Extremity     Shoulder Instructions      Home Living Family/patient expects to be discharged to:: Skilled nursing facility  Prior Functioning/Environment Level of Independence: Independent with assistive device(s);Needs assistance  Gait / Transfers Assistance Needed: has RW but likes to furniture walk at home per pt brother; also per pt brother Ms Yebra does not move around much--she primarily stays in her lift chair, sleeps in the bed at night ADL's / Homemaking Assistance Needed: pt reports she sponge bathes;         OT Diagnosis: Generalized weakness;Acute pain   OT Problem List: Decreased strength;Decreased  activity tolerance;Impaired balance (sitting and/or standing);Decreased safety awareness;Decreased knowledge of use of DME or AE;Cardiopulmonary status limiting activity;Obesity;Pain   OT Treatment/Interventions: Self-care/ADL training;Therapeutic exercise;Energy conservation;DME and/or AE instruction;Therapeutic activities;Patient/family education;Balance training    OT Goals(Current goals can be found in the care plan section) Acute Rehab OT Goals Patient Stated Goal: return to being independent OT Goal Formulation: With patient Time For Goal Achievement: 02/06/16 Potential to Achieve Goals: Good ADL Goals Pt Will Perform Lower Body Bathing: with min guard assist;sit to/from stand;with adaptive equipment Pt Will Perform Lower Body Dressing: with min guard assist;sit to/from stand;with adaptive equipment Pt Will Transfer to Toilet: with supervision;bedside commode;ambulating Pt/caregiver will Perform Home Exercise Program: Increased strength;Both right and left upper extremity;With theraband;With Supervision Additional ADL Goal #1: Pt will verbalize use of 3 energy conservation techniques for ADL  OT Frequency: Min 2X/week   Barriers to D/C:            Co-evaluation              End of Session Equipment Utilized During Treatment: Gait belt;Rolling walker Nurse Communication: Mobility status  Activity Tolerance: Patient tolerated treatment well Patient left: in chair;with call bell/phone within reach;with family/visitor present   Time: [id number] OT Time Calculation (min): 28 min Charges:  OT General Charges $OT Visit: 1 Procedure OT Evaluation $OT Eval Moderate Complexity: 1 Procedure OT Treatments $Self Care/Home Management : 8-22 mins G-Codes:    Cyrah Mclamb,HILLARY 2016/01/29, 9:21 AM   Maurie Boettcher, OTR/L  [phone] 01/29/16

## 2016-01-23 NOTE — Progress Notes (Signed)
4 Days Post-Op  Subjective: Passing some flatus, sites look fine.  Very little from the NG.  I will clamp and give her some clears from the floor.  If she does well pull NG later today.   She is concerned once she has stool it will be continuous.       Objective: Vital signs in last 24 hours: Temp:  [97.8 F (36.6 C)-99.7 F (37.6 C)] 97.8 F (36.6 C) (06/05 0445) Pulse Rate:  [110-120] 113 (06/05 0400) Resp:  [15-25] 15 (06/05 0400) BP: (137-157)/(50-63) 137/63 mmHg (06/05 0400) SpO2:  [96 %-100 %] 100 % (06/05 0732) Last BM Date: 01/18/16 NG 50 ml yesterday Urine 750  1426 positive fluid balance recorded  85.68 KG - 5/24,  84.7 KG 6/2 TNA 2156 Afebrile, VSS Labs OK  Prealbumin 9.6  Intake/Output from previous day: 06/04 0701 - 06/05 0700 In: 2226.2 [I.V.:20; NG/GT:50; TPN:2156.2] Out: 800 [Urine:750; Emesis/NG output:50] Intake/Output this shift:    General appearance: alert, cooperative and no distress Resp: clear to auscultation bilaterally GI: soft, few BS, site looks fine.   Lab Results:   Recent Labs  01/22/16 1534 01/23/16 0520  WBC 11.8* 8.3  HGB 8.3* 7.5*  HCT 26.5* 24.4*  PLT 258 237    BMET  Recent Labs  01/22/16 1534 01/23/16 0520  NA 133* 142  K 5.1 3.8  CL 103 108  CO2 23 27  GLUCOSE 656* 167*  BUN 19 20  CREATININE 0.67 0.64  CALCIUM 8.5* 8.4*   PT/INR No results for input(s): LABPROT, INR in the last 72 hours.   Recent Labs Lab 01/19/16 0452 01/23/16 0520  AST 39 24  ALT 49 24  ALKPHOS 48 43  BILITOT 0.1* 0.3  PROT 5.3* 4.9*  ALBUMIN 2.2* 1.9*     Lipase     Component Value Date/Time   LIPASE 33 08/04/2013 0010     Studies/Results: No results found.  Medications: . antiseptic oral rinse  7 mL Mouth Rinse BID  . arformoterol  15 mcg Nebulization BID   And  . umeclidinium bromide  1 puff Inhalation Daily  . aspirin  300 mg Rectal QHS  . azelastine  1 spray Each Nare BID  . clorazepate  15 mg Oral QHS  .  ezetimibe  10 mg Oral Daily  . furosemide  20 mg Intravenous Q48H  . heparin subcutaneous  5,000 Units Subcutaneous Q8H  . insulin aspart  0-20 Units Subcutaneous Q4H  . levothyroxine  12.5 mcg Intravenous Daily  . metoprolol  5 mg Intravenous Q6H  . montelukast  10 mg Oral QHS  . QUEtiapine  50 mg Oral Q breakfast  . rosuvastatin  20 mg Oral QHS  . sodium chloride flush  10-40 mL Intracatheter Q12H  . sodium chloride flush  3 mL Intravenous Q12H  . topiramate  25 mg Oral QHS    Assessment/Plan Partial small bowel Obstruction S/p Diagnostic laparoscopy converted to exploratory laparotomy, LYSIS OF ADHESIONS, 01/19/16, Dr. Greer Pickerel RLL pneumonia, community acquired with sepsis AODM type II CAD/minimally elevated troponin Acute Kidney injury/urinary retention Hypothyroid Hypertension Hx of asthma Anemia Malnutrition FEN: NPO/TNA ID: 8 days of Ceftriaxone/azithromycin completed 01/18/16; Cefotetan pre op 01/19/16 DVT: Heparin/SCD   Plan:  Try some clears, start some PO pain meds, foley back in and I will add some flowmax for her.  Hope to pull NG later today.     LOS: 12 days    [name] 01/23/2016 [phone]

## 2016-01-23 NOTE — Progress Notes (Signed)
[name]  Pharmacy Consult:  TPN Indication:  Bowel Obstruction   No Known Allergies  Patient Measurements: Height: 5\' 1"  (154.9 cm) Weight: 186 lb 11.7 oz (84.7 kg) IBW/kg (Calculated) : 47.8   Insulin Requirements in the past 24 hours:  24 units of resistant SSI + 15 units regular insulin in TPN  Assessment: 23 YOF presented with SOB and developed epigastric and RUQ pain.  CT scan on 01/12/16 showed high grade SBO.  Patient initially improved with SBO protocol and diet was advanced, but she unfortunately became bloated again and stopped having BM's since 01/16/16.  She underwent ex-lap with LoA on 01/19/16 and Pharmacy consulted to manage TPN for nutritional support.  GI: GERD - has been on and off full liquid/soft diet since admission with poor intake.  Prealbumin decreased to 9.6, likely d/t stress of illness.  NG O/P down to only 22mL >> clamp and may pull today, some flatus.  H2RA in TPN. Endo: hypothyroid on IV Synthroid.  DM on glimiperide at home - CBGs uncontrolled on SSI + insulin in TPN Lytes: all WNL Renal: SCr stable, CrCL 48 ml/min, BUN WNL - decent UOP 0.4 ml/kg/hr, Flomax Pulm: asthma - stable on RA - Brovana, Incruse Ellipta, Singulair Cards: CAD, HTN - BP controlled, tachy - ASA, Zetia, Lasix IV, Lopressor IV, Crestor Hepatobil: LFTs, tbili WNL.  TG mildly elevated at 175. Neuro: anxiety - scheduled APAP, Seroquel, clorazepate, Topamax Best Practices: SQ heparin  TPN Access: PICC double lumen placed 01/19/16 TPN start date: 01/19/16  Nutritional Goals:  1700-1900 kCal and 100-120 gm protein daily  Current Nutrition:  TPN Clear liquid diet started 6/5   Plan:  - Continue Clinimix E 5/15 at 83 ml/hr + lipids at 10 ml/hr.  TPN provides 1894 kCal and 100gm protein daily, meeting 100% of patient's needs. - Daily multivitamin and trace elements in TPN - Pepcid 40mg  daily in TPN (d/c bolus) - Increase regular insulin in TPN to 30  units + continue resistant SSI - F/U CBGs, PO intake to advance diet and start weaning TPN   Beauty Pless D. Mina Marble, PharmD, BCPS Pager:  [phone] 01/23/2016, 8:34 AM

## 2016-01-23 NOTE — Progress Notes (Signed)
UR COMPLETED  

## 2016-01-23 NOTE — Progress Notes (Signed)
PROGRESS NOTE  [name]  WUG:[id number] DOB: 23-Jul-1925  DOA: 01/11/2016 PCP: [name], MD   Brief Narrative:  82 year old female patient with a PMH of asthma, CAD, chronic diastolic CHF, DM, hypothyroid, GERD, HTN, presented to [facility] ED on 01/11/16 with 3-4 day history of dyspnea and productive cough, failed outpatient azithromycin, admitted with diagnosis of sepsis secondary to LLL CAP. Completed treatment for pneumonia. Persistent PSBO despite conservative management for several days. Status post diagnostic laparoscopy converted to exploratory laparotomy, lysis of adhesions 01/19/16 and transferred to stepdown unit postoperatively. Ongoing postop ileus. Mobilizing.  Assessment & Plan:   Principal Problem:   Sepsis due to pneumonia [facility]) Active Problems:   Diabetes mellitus type 2, controlled ([place])   Coronary atherosclerosis   Asthma with bronchitis   HTN (hypertension)   HLD (hyperlipidemia)   Hypothyroid   GERD (gastroesophageal reflux disease)   Diabetes mellitus type 2 without retinopathy ([place])   Sepsis ([place])   Acute on chronic respiratory failure (HCC)   SBO (small bowel obstruction) (HCC)   Hypokalemia   Preop cardiovascular exam   Sepsis due to Right lower lobe community-acquired pneumonia - Met sepsis criteria on admission. - Started empirically on Rocephin and azithromycin. Initially admitted to stepdown unit. - Blood cultures 2: Negative. Urine culture times one: Negative. Lactate: 1.9 > 1.2. Pro-calcitonin 0.26. - Has completed 8 days of IV antibiotics and antibiotics discontinued. - Repeat chest x-ray 5/29 shows resolution. - Sepsis resolved.  Partial Small bowel obstruction, S/P diagnostic laparoscopy converted to exploratory laparotomy, lysis of adhesions 01/19/16  - Gen. surgery was consulted. Patient failed conservative management. After cardiology preop clearance, patient underwent laparotomy and LOA on 01/19/16 - Management per general surgery: Bowel rest, NG  tube, TNA until bowel functions return.  - Postop ileus resolving. Had BM this morning. Flatus last night. As per general surgery, trying some clear liquids and plan to pull NG later today. Mobilize.  Acute kidney injury  - Presented with creatinine of 2.49. Likely secondary to dehydration and intravascular volume depletion. Resolved.   Acute respiratory failure with hypoxia - Secondary to community-acquired pneumonia. Resolved. Aggressive pulmonary toilet, incentive spirometry and mobilization.  Acute blood loss anemia - Hemoglobin has gradually dropped from 9.3 on 6/2 > 7.5 on 6/5. No overt bleeding. Follow CBC in a.m. and transfuse if hemoglobin less than 7 g per DL.  Leukocytosis - Resolved   Type II DM - Good inpatient control. Continue SSI. Oral hypoglycemics held.  Chronic diastolic CHF - Compensated. Continue home dose Lasix.   Hypothyroid - Continue Synthroid . We will change meds to IV while she is nothing by mouth.  Essential hypertension - Controlled on metoprolol-changed to IV while nothing by mouth..  Hyperlipidemia -Statins -may hold while nothing by mouth.   GERD - PPI IV.  Anxiety & depression  - Continue home medications-may hold some medications while nothing by mouth. IV Ativan when necessary.Marland Kitchen   CAD - Minimally elevated troponin but flat trend. No chest pain reported. Likely demand ischemia related to acute illness. Aspirin resumed. Plavix held in case surgery needed.  - Cardiology input regarding preop clearance appreciated: Recommend proceeding with surgery without further ischemia evaluation realizing there is some increased risk.  Hypokalemia - Resolved.   Acute urinary retention - Foley catheter had been removed postop on 6/3 but had to be replaced on 6/4 due to recurrent urinary retention. Starting Flomax. Consider voiding trials in a few days versus DC with Foley catheter and outpatient urology consultation  in 2 weeks.  Asthma -  Stable.  Chronic LBBB    DVT prophylaxis: SCDs, subcutaneous heparin Code Status: Full Family Communication: Discussed with patient & son at bedside. Disposition Plan: DC to SNF when medically stable. Postoperatively, patient was transferred from medical floor to stepdown unit on 01/19/16-transfer to medical floor when okay with general surgery.   Consultants:   General surgery  Cardiology.  Procedures:   NG tube  Foley catheter-DC 6/3. In and out cath when necessary  Right upper arm PICC 01/19/16 >  Status post diagnostic laparoscopy converted to exploratory laparotomy, lysis of adhesions 01/19/16   Antimicrobials:   IV Rocephin 5/24 > 5/31   Azithromycin  5/24 > 5/31    Subjective: Flatus + + overnight. BM this morning. Denies any other complaints. As per RN no acute issues.  Objective:  Filed Vitals:   01/23/16 0807 01/23/16 1045 01/23/16 1047 01/23/16 1054  BP:  121/74 91/60 95/73  Pulse:  131 136 126  Temp: 98.7 F (37.1 C)     TempSrc: Oral     Resp:  _0 Height:      Weight:      SpO2:  95% 96% 95%    Intake/Output Summary (Last 24 hours) at 01/23/16 1111 Last data filed at 01/23/16 2633  Gross per 24 hour  Intake 2412.23 ml  Output   1701 ml  Net 711.23 ml   Filed Weights   01/19/16 0425 01/19/16 1759 01/21/16 0429  Weight: 83.553 kg (184 lb 3.2 oz) 85 kg (187 lb 6.3 oz) 84.7 kg (186 lb 11.7 oz)    Examination:  General exam: Small built and frail pleasant elderly female lying comfortably supine in bed. Respiratory system: clear to auscultation. Respiratory effort normal. Cardiovascular system: S1 & S2 heard, RRR. No JVD, murmurs, rubs, gallops or clicks. No pedal edema. Telemetry: SR-ST with BBB morphology in the low 100s. Gastrointestinal system: Abdomen is non-distended, laparotomy area dressing clean and dry, mild appropriate postop tenderness without peritoneal signs. No organomegaly or masses felt. Improved bowel sounds.. Central  nervous system: Alert and oriented. No focal neurological deficits. Extremities: Symmetric 5 x 5 power. Skin: No rashes, lesions or ulcers Psychiatry: Judgement and insight appear normal. Mood & affect appropriate.     Data Reviewed: I have personally reviewed following labs and imaging studies  CBC:  Recent Labs Lab 01/19/16 0452 01/20/16 0500 01/22/16 1534 01/23/16 0520  WBC 8.7 9.3 11.8* 8.3  NEUTROABS 6.0  --   --  5.3  HGB 9.5* 9.3* 8.3* 7.5*  HCT 31.9* 30.4* 26.5* 24.4*  MCV 81.8 81.5 82.8 81.3  PLT 255 245 258 354   Basic Metabolic Panel:  Recent Labs Lab 01/18/16 0523 01/19/16 0452 01/20/16 0500 01/22/16 1534 01/23/16 0520  NA 140 140 141 133* 142  K 3.8 4.0 3.8 5.1 3.8  CL 108 107 107 103 108  CO2 _1 GLUCOSE 142* 132* 161* 656* 167*  BUN _2 CREATININE 0.79 0.79 0.84 0.67 0.64  CALCIUM 8.8* 8.8* 8.5* 8.5* 8.4*  MG  --  1.8 2.0  --  1.9  PHOS  --  4.2 3.9  --  4.5   GFR: Estimated Creatinine Clearance: 48 mL/min (by C-G formula based on Cr of 0.64). Liver Function Tests:  Recent Labs Lab 01/19/16 0452 01/23/16 0520  AST 39 24  ALT 49 24  ALKPHOS 48 43  BILITOT 0.1* 0.3  PROT 5.3*  4.9*  ALBUMIN 2.2* 1.9*   No results for input(s): LIPASE, AMYLASE in the last 168 hours. No results for input(s): AMMONIA in the last 168 hours. Coagulation Profile: No results for input(s): INR, PROTIME in the last 168 hours. Cardiac Enzymes: No results for input(s): CKTOTAL, CKMB, CKMBINDEX, TROPONINI in the last 168 hours. BNP (last 3 results) No results for input(s): PROBNP in the last 8760 hours. HbA1C: No results for input(s): HGBA1C in the last 72 hours. CBG:  Recent Labs Lab 01/22/16 1555 01/22/16 1948 01/23/16 0015 01/23/16 0359 01/23/16 0804  GLUCAP 158* 172* 200* 177* 203*   Lipid Profile:  Recent Labs  01/23/16 0520  TRIG 175*   Thyroid Function Tests: No results for input(s): TSH, T4TOTAL, FREET4, T3FREE,  THYROIDAB in the last 72 hours. Anemia Panel: No results for input(s): VITAMINB12, FOLATE, FERRITIN, TIBC, IRON, RETICCTPCT in the last 72 hours.  Sepsis Labs: No results for input(s): PROCALCITON, LATICACIDVEN in the last 168 hours.  Recent Results (from the past 240 hour(s))  Surgical pcr screen     Status: None   Collection Time: 01/19/16 11:59 AM  Result Value Ref Range Status   MRSA, PCR NEGATIVE NEGATIVE Final   Staphylococcus aureus NEGATIVE NEGATIVE Final    Comment:        The Xpert SA Assay (FDA approved for NASAL specimens in patients over 76 years of age), is one component of a comprehensive surveillance program.  Test performance has been validated by [facility] for patients greater than or equal to 26 year old. It is not intended to diagnose infection nor to guide or monitor treatment.          Radiology Studies: No results found.      Scheduled Meds: . acetaminophen  650 mg Oral Q6H  . antiseptic oral rinse  7 mL Mouth Rinse BID  . arformoterol  15 mcg Nebulization BID   And  . umeclidinium bromide  1 puff Inhalation Daily  . aspirin  300 mg Rectal QHS  . azelastine  1 spray Each Nare BID  . clorazepate  15 mg Oral QHS  . ezetimibe  10 mg Oral Daily  . furosemide  20 mg Intravenous Q48H  . heparin subcutaneous  5,000 Units Subcutaneous Q8H  . insulin aspart  0-20 Units Subcutaneous Q4H  . levothyroxine  12.5 mcg Intravenous Daily  . metoprolol  5 mg Intravenous Q6H  . montelukast  10 mg Oral QHS  . QUEtiapine  50 mg Oral Q breakfast  . rosuvastatin  20 mg Oral QHS  . sodium chloride flush  10-40 mL Intracatheter Q12H  . sodium chloride flush  3 mL Intravenous Q12H  . tamsulosin  0.4 mg Oral QPC breakfast  . topiramate  25 mg Oral QHS   Continuous Infusions: . Marland KitchenTPN (CLINIMIX-E) Adult 83 mL/hr at 01/23/16 0600   And  . fat emulsion 240 mL (01/23/16 0600)  . Marland KitchenTPN (CLINIMIX-E) Adult     And  . fat emulsion       LOS: 12 days   Time  spent: 20 minutes.   [facility], MD [facility] Pager [phone]  If 7PM-7AM, please contact night-coverage [email or web address] Password [email or web address] 01/23/2016, 11:11 AM

## 2016-01-23 NOTE — Progress Notes (Signed)
Physical Therapy Treatment Patient Details Name: [name] MRN: [id number] DOB: 04-05-1925 Today's Date: 01/23/2016    History of Present Illness 82 y.o. female adm with 3-4d of SOB, also with abd distension, found to have SBO d/y adhesion;    PMHx: Asthma not on home oxygen, CAD, DM2, hypothyroidism, GERD, CAD, OA, R TKA.  Pt s/p exploratory laparotomy due to partial SBO on 01/19/16.      PT Comments    Patient seen for mobility progression. Tolerated increased activity and ambulation in hall. Continues to require physical assist and cues for functional mobility. Patient receptive and appreciative. Will continue to see and progress as tolerated. OF NOTE: HR elevated to 130s with increased activity.  Follow Up Recommendations  SNF     Equipment Recommendations  Other (comment) (shower chair)    Recommendations for Other Services       Precautions / Restrictions Precautions Precautions: Fall Precaution Comments: NGT, monitor HR Restrictions Weight Bearing Restrictions: No    Mobility  Bed Mobility Overal bed mobility: Needs Assistance Bed Mobility: Supine to Sit   Sidelying to sit: Min assist Supine to sit: Mod assist     General bed mobility comments: min assist to come to EOB with increased time, moderate assist to return to bed with cues for splinting for pain control  Transfers Overall transfer level: Needs assistance Equipment used: Rolling walker (2 wheeled) Transfers: Sit to/from Stand Sit to Stand: Min assist         General transfer comment: Min assist with increased time and effort to elevate to standing, initial posterior LOB but able to shift forward with minimal assist for stability  Ambulation/Gait Ambulation/Gait assistance: Min assist Ambulation Distance (Feet): 60 Feet Assistive device: Rolling walker (2 wheeled) Gait Pattern/deviations: Step-through pattern;Decreased stride length;Shuffle;Drifts right/left;Narrow base of support Gait velocity:  decreased Gait velocity interpretation: <1.8 ft/sec, indicative of risk for recurrent falls General Gait Details: VCs for increased cadence and step length. reliance on Rw for stability   Stairs            Wheelchair Mobility    Modified Rankin (Stroke Patients Only)       Balance     Sitting balance-Leahy Scale: Fair Sitting balance - Comments: close supervision once feet supported on ground   Standing balance support: Bilateral upper extremity supported Standing balance-Leahy Scale: Fair                      Cognition Arousal/Alertness: Awake/alert Behavior During Therapy: WFL for tasks assessed/performed Overall Cognitive Status: Within Functional Limits for tasks assessed                      Exercises General Exercises - Lower Extremity Ankle Circles/Pumps: AROM;Both;10 reps    General Comments        Pertinent Vitals/Pain Pain Assessment: Faces Faces Pain Scale: Hurts little more Pain Location: abdominal incision Pain Descriptors / Indicators: Guarding;Grimacing Pain Intervention(s): Monitored during session    Home Living                      Prior Function            PT Goals (current goals can now be found in the care plan section) Acute Rehab PT Goals Patient Stated Goal: return to being independent PT Goal Formulation: With patient Time For Goal Achievement: 02/03/16 Potential to Achieve Goals: Good Progress towards PT goals: Progressing toward goals  Frequency  Min 2X/week (would benefit from more)    PT Plan Frequency needs to be updated    Co-evaluation             End of Session Equipment Utilized During Treatment: Gait belt Activity Tolerance: Patient limited by pain Patient left: in bed;with call bell/phone within reach;with nursing/sitter in room     Time: [id number] PT Time Calculation (min) (ACUTE ONLY): 21 min  Charges:  $Gait Training: 8-22 mins                    G CodesDuncan Dull 2016/02/22, 5:07 PM Alben Deeds, [place] DPT  [phone]

## 2016-01-23 NOTE — Progress Notes (Signed)
Nutrition Follow-up  DOCUMENTATION CODES:   Non-severe (moderate) malnutrition in context of chronic illness, Obesity unspecified  INTERVENTION:  TPN per pharmacy  Boost Breeze BID. Each supplement provides 250 kcals and 9 grams of protein.    NUTRITION DIAGNOSIS:   Malnutrition related to chronic illness as evidenced by mild depletion of muscle mass, percent weight loss (10% weight loss within 6 months).  Ongoing   GOAL:   Patient will meet greater than or equal to 90% of their needs  Meeting  MONITOR:   Diet advancement, Labs, Weight trends, I & O's   ASSESSMENT:   82 y.o. female with a Past Medical History of asthma, CAD, DM, hypogonadism, GERD, CAD, who presents with sepsis from CAP and possible UTI.  6/1 Diagnostic laparoscopy converted to exploratory laparotomy w/ LOA, transferred to stepdown unit 6/5 BM, diet advanced to add clear liquids, plan to pull NG today.   Pt with difficulty communicating.  Pt states she tolerated the small amounts of clear liquids she received yesterday. Could not give a report of what she drank. She states she has been sleeping a lot and is confused. Pt willing to try Boost Breeze. Will order BID and continue to follow as diet is advanced.   Plan per pharm 6/5:  - Continue Clinimix E 5/15 at 83 ml/hr + lipids at 10 ml/hr. TPN provides 1894 kCal and 100gm protein daily, meeting 100% of patient's needs. - Daily multivitamin and trace elements in TPN - Pepcid 40mg  daily in TPN (d/c bolus) - Increase regular insulin in TPN to 30 units + continue resistant SSI - F/U CBGs, PO intake to advance diet and start weaning TPN  Labs reviewed; Ca 8.4, CBGs 158-203, Elevated TG 175 Meds reviewed; lasix 20 mg every 48 hrs,   Diet Order:  TPN (CLINIMIX-E) Adult Diet clear liquid Room service appropriate?: No; Fluid consistency:: Thin TPN (CLINIMIX-E) Adult  Skin:  Reviewed, no issues  Last BM:  6/5  Height:   Ht Readings from Last 1  Encounters:  01/19/16 5\' 1"  (1.549 m)    Weight:   Wt Readings from Last 1 Encounters:  01/23/16 197 lb 5 oz (89.5 kg)    Ideal Body Weight:  47.7 kg  BMI:  Body mass index is 37.3 kg/(m^2).  Estimated Nutritional Needs:   Kcal:  1700-1900  Protein:  100-120 gm  Fluid:  1.7-2 L  EDUCATION NEEDS:   No education needs identified at this time  Geoffery Lyons, [place] Dietetic Intern Pager [phone]

## 2016-01-23 NOTE — Care Management Important Message (Signed)
Important Message  Patient Details  Name: [name] MRN: [id number] Date of Birth: 06/04/1925   Medicare Important Message Given:  Yes    Joelle Roswell Abena 01/23/2016, 12:00 PM

## 2016-01-23 NOTE — Clinical Social Work Note (Signed)
CSW continues to follow for discharge needs. [place] place updated on status this morning.  Dayton Scrape, [place]

## 2016-01-24 ENCOUNTER — Inpatient Hospital Stay (HOSPITAL_COMMUNITY): Payer: Medicare Other

## 2016-01-24 LAB — CBC
HEMATOCRIT: 25.3 % — AB (ref 36.0–46.0)
Hemoglobin: 7.7 g/dL — ABNORMAL LOW (ref 12.0–15.0)
MCH: 25 pg — ABNORMAL LOW (ref 26.0–34.0)
MCHC: 30.4 g/dL (ref 30.0–36.0)
MCV: 82.1 fL (ref 78.0–100.0)
Platelets: 245 10*3/uL (ref 150–400)
RBC: 3.08 MIL/uL — ABNORMAL LOW (ref 3.87–5.11)
RDW: 15.3 % (ref 11.5–15.5)
WBC: 7.2 10*3/uL (ref 4.0–10.5)

## 2016-01-24 LAB — GLUCOSE, CAPILLARY
GLUCOSE-CAPILLARY: 166 mg/dL — AB (ref 65–99)
GLUCOSE-CAPILLARY: 171 mg/dL — AB (ref 65–99)
Glucose-Capillary: 177 mg/dL — ABNORMAL HIGH (ref 65–99)
Glucose-Capillary: 144 mg/dL — ABNORMAL HIGH (ref 65–99)
Glucose-Capillary: 156 mg/dL — ABNORMAL HIGH (ref 65–99)
Glucose-Capillary: 135 mg/dL — ABNORMAL HIGH (ref 65–99)

## 2016-01-24 MED ORDER — FAT EMULSION 20 % IV EMUL
240.0000 mL | INTRAVENOUS | Status: AC
  Administered 2016-01-24: 240 mL via INTRAVENOUS
  Filled 2016-01-24: qty 250

## 2016-01-24 MED ORDER — TRACE MINERALS CR-CU-MN-SE-ZN 10-1000-500-60 MCG/ML IV SOLN
INTRAVENOUS | Status: AC
  Administered 2016-01-24: 18:00:00 via INTRAVENOUS
  Filled 2016-01-24: qty 1320

## 2016-01-24 MED ORDER — TRACE MINERALS CR-CU-MN-SE-ZN 10-1000-500-60 MCG/ML IV SOLN
INTRAVENOUS | Status: DC
  Filled 2016-01-24: qty 1992

## 2016-01-24 MED ORDER — SODIUM CHLORIDE 0.9 % IV BOLUS (SEPSIS)
500.0000 mL | Freq: Once | INTRAVENOUS | Status: AC
  Administered 2016-01-24: 500 mL via INTRAVENOUS

## 2016-01-24 MED ORDER — FAT EMULSION 20 % IV EMUL
240.0000 mL | INTRAVENOUS | Status: DC
  Filled 2016-01-24: qty 250

## 2016-01-24 NOTE — Care Management Note (Signed)
Case Management Note  Patient Details  Name: KAYELA PENAFIEL MRN: [id number] Date of Birth: 05-08-1925  Subjective/Objective:     Sepsis due to pneumonia                Action/Plan: Discharge Planning:  Chart reviewed. Scheduled dc to SNF[facility] when medically stable.  PCP- [name] MD  Expected Discharge Date:                Expected Discharge Plan:  [place]  In-House Referral:  Clinical Social Work  Discharge planning Services  CM Consult  Post Acute Care Choice:  NA Choice offered to:  NA  DME Arranged:  N/A DME Agency:  NA  HH Arranged:  NA HH Agency:  NA  Status of Service:  Completed, signed off  Medicare Important Message Given:  Yes Date Medicare IM Given:    Medicare IM give by:    Date Additional Medicare IM Given:    Additional Medicare Important Message give by:     If discussed at [place] of Stay Meetings, dates discussed:    Additional Comments:  Erenest Rasher, RN 01/24/2016, 2:16 PM

## 2016-01-24 NOTE — Progress Notes (Signed)
Pt states she has been in the chair so long today that she does not want to walk. Previous nurse attempted to walk pt before shift change and pt refused. Pt educated on the importance of ambulating and she verbalizes understanding. Will continue to monitor.

## 2016-01-24 NOTE — Progress Notes (Signed)
5 Days Post-Op  Subjective: She seems better, no nausea NG clamped some stool yesterday.  Tolerating clears.  I pulled her NG.  She walked 4 times yesterday.  Site looks fine.  Objective: Vital signs in last 24 hours: Temp:  [97.4 F (36.3 C)-99.7 F (37.6 C)] 98.2 F (36.8 C) (06/06 0736) Pulse Rate:  [109-136] 113 (06/06 0736) Resp:  [16-28] 18 (06/06 0736) BP: (91-134)/(27-74) 127/50 mmHg (06/06 0736) SpO2:  [94 %-100 %] 100 % (06/06 0840) Weight:  [88.6 kg (195 lb 5.2 oz)-89.5 kg (197 lb 5 oz)] 88.6 kg (195 lb 5.2 oz) (06/06 0400) Last BM Date: 01/23/16 180 PO recorded this AM Stool x 1 recorded Afebrile, VSS WBC 7.2 H/h stable, but low   Intake/Output from previous day: 06/05 0701 - 06/06 0700 In: 2325 [id number] Out: 2076 [Urine:2075; Stool:1] Intake/Output this shift: Total I/O In: 190 [P.O.:180; I.V.:10] Out: 650 [Urine:650]  General appearance: alert, cooperative, no distress and up in chair Resp: few rales in the base. Cardio: remains in sinus tachycardia   GI: soft, few BS, some stool yesterday.    Lab Results:   Recent Labs  01/23/16 0520 01/24/16 0420  WBC 8.3 7.2  HGB 7.5* 7.7*  HCT 24.4* 25.3*  PLT 237 245    BMET  Recent Labs  01/22/16 1534 01/23/16 0520  NA 133* 142  K 5.1 3.8  CL 103 108  CO2 23 27  GLUCOSE 656* 167*  BUN 19 20  CREATININE 0.67 0.64  CALCIUM 8.5* 8.4*   PT/INR No results for input(s): LABPROT, INR in the last 72 hours.   Recent Labs Lab 01/19/16 0452 01/23/16 0520  AST 39 24  ALT 49 24  ALKPHOS 48 43  BILITOT 0.1* 0.3  PROT 5.3* 4.9*  ALBUMIN 2.2* 1.9*     Lipase     Component Value Date/Time   LIPASE 33 08/04/2013 0010     Studies/Results: No results found.  Medications: . acetaminophen  650 mg Oral Q6H  . antiseptic oral rinse  7 mL Mouth Rinse BID  . arformoterol  15 mcg Nebulization BID   And  . umeclidinium bromide  1 puff Inhalation Daily  . aspirin  300 mg Rectal QHS  . azelastine   1 spray Each Nare BID  . clorazepate  15 mg Oral QHS  . ezetimibe  10 mg Oral Daily  . feeding supplement  1 Container Oral BID BM  . furosemide  20 mg Intravenous Q48H  . heparin subcutaneous  5,000 Units Subcutaneous Q8H  . insulin aspart  0-20 Units Subcutaneous Q4H  . levothyroxine  12.5 mcg Intravenous Daily  . metoprolol  5 mg Intravenous Q6H  . montelukast  10 mg Oral QHS  . QUEtiapine  50 mg Oral Q breakfast  . rosuvastatin  20 mg Oral QHS  . sodium chloride flush  10-40 mL Intracatheter Q12H  . sodium chloride flush  3 mL Intravenous Q12H  . tamsulosin  0.4 mg Oral QPC breakfast  . topiramate  25 mg Oral QHS   . Marland KitchenTPN (CLINIMIX-E) Adult 83 mL/hr at 01/24/16 0700   And  . fat emulsion 240 mL (01/24/16 0700)  . Marland KitchenTPN (CLINIMIX-E) Adult     And  . fat emulsion      Assessment/Plan Partial small bowel Obstruction S/p Diagnostic laparoscopy converted to exploratory laparotomy, LYSIS OF ADHESIONS, 01/19/16, Dr. Greer Pickerel RLL pneumonia, community acquired with sepsis AODM type II CAD/minimally elevated troponin Acute Kidney injury/urinary retention Hypothyroid Hypertension  Hx of asthma Anemia Malnutrition FEN: clears from the floor/TNA ID: 8 days of Ceftriaxone/azithromycin completed 01/18/16; Cefotetan pre op 01/19/16 DVT: Heparin/SCD   Plan:  I am concerned about her ongoing tachycardia, I will put her on clears and check a chest xray. Decrease TNA, and get her some  PO supplements for nutrition.  Would consider transfusing, even though her H/H is not below 7.  Will discuss with Medicine.    LOS: 13 days    Jahziel Sinn 01/24/2016 [phone]

## 2016-01-24 NOTE — Progress Notes (Signed)
PROGRESS NOTE  [name]  GGE:[id number] DOB: 02-Jul-1925  DOA: 01/11/2016 PCP: [name], MD   Brief Narrative:  82 year old female patient with a PMH of asthma, CAD, chronic diastolic CHF, DM, hypothyroid, GERD, HTN, presented to [facility] ED on 01/11/16 with 3-4 day history of dyspnea and productive cough, failed outpatient azithromycin, admitted with diagnosis of sepsis secondary to LLL CAP. Completed treatment for pneumonia. Persistent PSBO despite conservative management for several days. Status post diagnostic laparoscopy converted to exploratory laparotomy, lysis of adhesions 01/19/16 and transferred to stepdown unit postoperatively. Ongoing postop ileus. Mobilizing.  Assessment & Plan:   Principal Problem:   Sepsis due to pneumonia [facility]) Active Problems:   Diabetes mellitus type 2, controlled ([place])   Coronary atherosclerosis   Asthma with bronchitis   HTN (hypertension)   HLD (hyperlipidemia)   Hypothyroid   GERD (gastroesophageal reflux disease)   Diabetes mellitus type 2 without retinopathy ([place])   Sepsis ([place])   Acute on chronic respiratory failure (HCC)   SBO (small bowel obstruction) (HCC)   Hypokalemia   Preop cardiovascular exam   Sepsis due to Right lower lobe community-acquired pneumonia - Met sepsis criteria on admission. - Started empirically on Rocephin and azithromycin. Initially admitted to stepdown unit. - Blood cultures 2: Negative. Urine culture times one: Negative. Lactate: 1.9 > 1.2. Pro-calcitonin 0.26. - Has completed 8 days of IV antibiotics and antibiotics discontinued. - Repeat chest x-ray 5/29 shows resolution. - Sepsis resolved.  Partial Small bowel obstruction, S/P diagnostic laparoscopy converted to exploratory laparotomy, lysis of adhesions 01/19/16  - Gen. surgery was consulted. Patient failed conservative management. After cardiology preop clearance, patient underwent laparotomy and LOA on 01/19/16 - Management per general surgery: Bowel rest, NG  tube, TNA until bowel functions return.  - Postop ileus resolving slowly. Patient had a BM early morning on 6/5 and lots of flatus the night before. NG was clamped. Oral clears were tried. However since then, no further BM or flatus and she complains of abdominal bloating. Management per surgery.  Acute kidney injury  - Presented with creatinine of 2.49. Likely secondary to dehydration and intravascular volume depletion. Resolved.   Acute respiratory failure with hypoxia - Secondary to community-acquired pneumonia. Resolved. Aggressive pulmonary toilet, incentive spirometry and mobilization.  Acute blood loss anemia - Hemoglobin has gradually dropped from 9.3 on 6/2 > 7.5 on 6/5. No overt bleeding. Follow CBC in a.m. and transfuse if hemoglobin less than 7 g per DL. Hemoglobin 7.7.  Leukocytosis - Resolved   Type II DM - Good inpatient control. Oral hypoglycemics held. Patient on insulin via TPN and resistant NovoLog SSI, managed by pharmacy.  Chronic diastolic CHF - Compensated. Continue home dose Lasix. May be a little on the dry side. Discussed with pharmacy to increase fluids in TPN.  Hypothyroid - Continue Synthroid . Changed meds to IV while she is nothing by mouth.  Essential hypertension - Controlled on metoprolol-changed to IV while nothing by mouth..  Hyperlipidemia -Statins -may hold while nothing by mouth.   GERD - PPI IV.  Anxiety & depression  - Continue home medications-may hold some medications while nothing by mouth. IV Ativan when necessary.Marland Kitchen   CAD - Minimally elevated troponin but flat trend. No chest pain reported. Likely demand ischemia related to acute illness. Aspirin resumed. Plavix held for surgery-resume when cleared by surgery. - Cardiology had seen for preop clearance.  Hypokalemia - Resolved.   Acute urinary retention - Foley catheter had been removed postop on 6/3 but  had to be replaced on 6/4 due to recurrent urinary retention. Starting  Flomax. Consider voiding trials in a few days versus DC with Foley catheter and outpatient urology consultation in 2 weeks.  Asthma - Stable.  Chronic LBBB, sinus tachycardia - Asymptomatic sinus tachycardia in the 110s. Remains on IV metoprolol.? Volume depletion. Request pharmacy to increase fluids in TPN. No clinical evidence of sepsis.    DVT prophylaxis: SCDs, subcutaneous heparin Code Status: Full Family Communication: Discussed with patient. No family at bedside. Disposition Plan: DC to SNF when medically stable. Postoperatively, patient was transferred from medical floor to stepdown unit on 01/19/16-transfer to medical floor when okay with general surgery.   Consultants:   General surgery  Cardiology.  Procedures:   NG tube  Foley catheter-DC 6/3. In and out cath when necessary  Right upper arm PICC 01/19/16 >  Status post diagnostic laparoscopy converted to exploratory laparotomy, lysis of adhesions 01/19/16   Antimicrobials:   IV Rocephin 5/24 > 5/31   Azithromycin  5/24 > 5/31    Subjective: No BM or flatus since early morning of 6/5. Complaints of abdominal distention without pain. Throat pain due to NG tube. As per RN, no other acute issues.  Objective:  Filed Vitals:   01/24/16 0400 01/24/16 0736 01/24/16 0839 01/24/16 0840  BP:  127/50    Pulse:  113    Temp:  98.2 F (36.8 C)    TempSrc:  Oral    Resp:  18    Height:      Weight: 88.6 kg (195 lb 5.2 oz)     SpO2:  97% 96% 100%    Intake/Output Summary (Last 24 hours) at 01/24/16 1021 Last data filed at 01/24/16 0941  Gross per 24 hour  Intake   2143 ml  Output   1825 ml  Net    318 ml   Filed Weights   01/21/16 0429 01/23/16 1054 01/24/16 0400  Weight: 84.7 kg (186 lb 11.7 oz) 89.5 kg (197 lb 5 oz) 88.6 kg (195 lb 5.2 oz)    Examination:  General exam: Small built and frail pleasant elderly female lying comfortably supine in bed. Respiratory system: clear to auscultation. Respiratory  effort normal. Cardiovascular system: S1 & S2 heard, RRR. No JVD, murmurs, rubs, gallops or clicks. No pedal edema. Telemetry: ST with BBB morphology in the 110s. Gastrointestinal system: Abdomen is mildly distended, laparotomy site clean and dry, mild appropriate postop tenderness without peritoneal signs. No organomegaly or masses felt. Slightly diminished bowel sounds compared to yesterday.. Central nervous system: Alert and oriented. No focal neurological deficits. Extremities: Symmetric 5 x 5 power. Skin: No rashes, lesions or ulcers Psychiatry: Judgement and insight appear normal. Mood & affect appropriate.     Data Reviewed: I have personally reviewed following labs and imaging studies  CBC:  Recent Labs Lab 01/19/16 0452 01/20/16 0500 01/22/16 1534 01/23/16 0520 01/24/16 0420  WBC 8.7 9.3 11.8* 8.3 7.2  NEUTROABS 6.0  --   --  5.3  --   HGB 9.5* 9.3* 8.3* 7.5* 7.7*  HCT 31.9* 30.4* 26.5* 24.4* 25.3*  MCV 81.8 81.5 82.8 81.3 82.1  PLT 255 245 258 237 889   Basic Metabolic Panel:  Recent Labs Lab 01/18/16 0523 01/19/16 0452 01/20/16 0500 01/22/16 1534 01/23/16 0520  NA 140 140 141 133* 142  K 3.8 4.0 3.8 5.1 3.8  CL 108 107 107 103 108  CO2 26 26 27 23 27   GLUCOSE 142* 132* 161* 656*  167*  BUN 7 7 9 19 20   CREATININE 0.79 0.79 0.84 0.67 0.64  CALCIUM 8.8* 8.8* 8.5* 8.5* 8.4*  MG  --  1.8 2.0  --  1.9  PHOS  --  4.2 3.9  --  4.5   GFR: Estimated Creatinine Clearance: 49.2 mL/min (by C-G formula based on Cr of 0.64). Liver Function Tests:  Recent Labs Lab 01/19/16 0452 01/23/16 0520  AST 39 24  ALT 49 24  ALKPHOS 48 43  BILITOT 0.1* 0.3  PROT 5.3* 4.9*  ALBUMIN 2.2* 1.9*   No results for input(s): LIPASE, AMYLASE in the last 168 hours. No results for input(s): AMMONIA in the last 168 hours. Coagulation Profile: No results for input(s): INR, PROTIME in the last 168 hours. Cardiac Enzymes: No results for input(s): CKTOTAL, CKMB, CKMBINDEX,  TROPONINI in the last 168 hours. BNP (last 3 results) No results for input(s): PROBNP in the last 8760 hours. HbA1C: No results for input(s): HGBA1C in the last 72 hours. CBG:  Recent Labs Lab 01/23/16 1223 01/23/16 1623 01/23/16 1931 01/23/16 2319 01/24/16 0403  GLUCAP 204* 158* 145* 166* 177*   Lipid Profile:  Recent Labs  01/23/16 0520  TRIG 175*   Thyroid Function Tests: No results for input(s): TSH, T4TOTAL, FREET4, T3FREE, THYROIDAB in the last 72 hours. Anemia Panel: No results for input(s): VITAMINB12, FOLATE, FERRITIN, TIBC, IRON, RETICCTPCT in the last 72 hours.  Sepsis Labs: No results for input(s): PROCALCITON, LATICACIDVEN in the last 168 hours.  Recent Results (from the past 240 hour(s))  Surgical pcr screen     Status: None   Collection Time: 01/19/16 11:59 AM  Result Value Ref Range Status   MRSA, PCR NEGATIVE NEGATIVE Final   Staphylococcus aureus NEGATIVE NEGATIVE Final    Comment:        The Xpert SA Assay (FDA approved for NASAL specimens in patients over 36 years of age), is one component of a comprehensive surveillance program.  Test performance has been validated by [facility] for patients greater than or equal to 56 year old. It is not intended to diagnose infection nor to guide or monitor treatment.          Radiology Studies: No results found.      Scheduled Meds: . acetaminophen  650 mg Oral Q6H  . antiseptic oral rinse  7 mL Mouth Rinse BID  . arformoterol  15 mcg Nebulization BID   And  . umeclidinium bromide  1 puff Inhalation Daily  . aspirin  300 mg Rectal QHS  . azelastine  1 spray Each Nare BID  . clorazepate  15 mg Oral QHS  . ezetimibe  10 mg Oral Daily  . feeding supplement  1 Container Oral BID BM  . furosemide  20 mg Intravenous Q48H  . heparin subcutaneous  5,000 Units Subcutaneous Q8H  . insulin aspart  0-20 Units Subcutaneous Q4H  . levothyroxine  12.5 mcg Intravenous Daily  . metoprolol  5 mg  Intravenous Q6H  . montelukast  10 mg Oral QHS  . QUEtiapine  50 mg Oral Q breakfast  . rosuvastatin  20 mg Oral QHS  . sodium chloride flush  10-40 mL Intracatheter Q12H  . sodium chloride flush  3 mL Intravenous Q12H  . tamsulosin  0.4 mg Oral QPC breakfast  . topiramate  25 mg Oral QHS   Continuous Infusions: . Marland KitchenTPN (CLINIMIX-E) Adult 83 mL/hr at 01/24/16 0700   And  . fat emulsion 240 mL (01/24/16 0700)  . Marland Kitchen  TPN (CLINIMIX-E) Adult     And  . fat emulsion       LOS: 13 days   Time spent: 20 minutes.   [facility], MD [facility] Pager [phone]  If 7PM-7AM, please contact night-coverage [email or web address] Password [email or web address] 01/24/2016, 10:21 AM

## 2016-01-24 NOTE — Progress Notes (Addendum)
PARENTERAL NUTRITION CONSULT NOTE - FOLLOW UP  Pharmacy Consult:  TPN Indication:  Bowel Obstruction   No Known Allergies  Patient Measurements: Height: 5\' 1"  (154.9 cm) Weight: 195 lb 5.2 oz (88.6 kg) IBW/kg (Calculated) : 47.8   Insulin Requirements in the past 24 hours:  29 units of resistant SSI + 30 units regular insulin in TPN  Assessment: 16 YOF presented with SOB and developed epigastric and RUQ pain.  CT scan on 01/12/16 showed high grade SBO.  Patient initially improved with SBO protocol and diet was advanced, but she unfortunately became bloated again and stopped having BM's since 01/16/16.  She underwent ex-lap with LoA on 01/19/16 and Pharmacy consulted to manage TPN for nutritional support.  GI: GERD - has been on and off full liquid/soft diet since admission with poor intake.  Prealbumin decreased to 9.6, likely d/t stress of illness.  No NG O/P documented >> may pull today, some flatus, +BM.  H2RA in TPN.  Patient reports drinking fluid causes pain d/t the presence of the NG tube Endo: hypothyroid on IV Synthroid.  DM on glimiperide at home - CBGs improving, significant use of SSI despite insulin in TPN Lytes: all WNL on 6/5 Renal: SCr stable, CrCL 48 ml/min, BUN WNL - decent UOP 1 ml/kg/hr, Flomax Pulm: asthma - stable on RA - Brovana, Incruse Ellipta, Singulair Cards: CAD, HTN - BP controlled, tachy - ASA, Zetia, Lasix IV, Lopressor IV, Crestor Hepatobil: LFTs, tbili WNL.  TG mildly elevated at 175. Neuro: anxiety - scheduled APAP, Seroquel, clorazepate, Topamax Best Practices: SQ heparin  TPN Access: PICC double lumen placed 01/19/16 TPN start date: 01/19/16  Nutritional Goals:  1700-1900 kCal and 100-120 gm protein daily  Current Nutrition:  TPN Clear liquid diet started 6/5 - consume at most 30% of meals per [employer] Boost BID - none given   Plan:  - Continue Clinimix E 5/15 at 83 ml/hr + lipids at 10 ml/hr.  TPN provides 1894 kCal and 100gm  protein daily, meeting 100% of patient's needs. - Daily multivitamin and trace elements in TPN - Pepcid 40mg  daily in TPN (d/c bolus) - Increase regular insulin in TPN to 45 units + continue resistant SSI Q4H - F/U CBGs, PO intake to advance diet and start weaning TPN   Sherran Margolis D. Mina Marble, PharmD, BCPS Pager:  [phone] 01/24/2016, 8:17 AM   ========================  Addendum: - received order to reduce TPN rate   Plan: - Decrease Clinimix E 5/15 to 55 ml/hr (goal 83 ml/hr) + continue lipids at 10 ml/hr. - Reduce regular insulin in TPN to 30 units   Antwanette Wesche D. Mina Marble, PharmD, BCPS Pager:  [phone] 01/24/2016, 10:49 AM

## 2016-01-25 DIAGNOSIS — J9621 Acute and chronic respiratory failure with hypoxia: Secondary | ICD-10-CM

## 2016-01-25 DIAGNOSIS — K5669 Other intestinal obstruction: Secondary | ICD-10-CM

## 2016-01-25 DIAGNOSIS — E876 Hypokalemia: Secondary | ICD-10-CM

## 2016-01-25 DIAGNOSIS — E038 Other specified hypothyroidism: Secondary | ICD-10-CM

## 2016-01-25 LAB — GLUCOSE, CAPILLARY
GLUCOSE-CAPILLARY: 156 mg/dL — AB (ref 65–99)
GLUCOSE-CAPILLARY: 157 mg/dL — AB (ref 65–99)
GLUCOSE-CAPILLARY: 140 mg/dL — AB (ref 65–99)
Glucose-Capillary: 112 mg/dL — ABNORMAL HIGH (ref 65–99)
Glucose-Capillary: 113 mg/dL — ABNORMAL HIGH (ref 65–99)
Glucose-Capillary: 131 mg/dL — ABNORMAL HIGH (ref 65–99)

## 2016-01-25 LAB — C DIFFICILE QUICK SCREEN W PCR REFLEX
C DIFFICILE (CDIFF) INTERP: NEGATIVE
C DIFFICILE (CDIFF) TOXIN: NEGATIVE
C DIFFICLE (CDIFF) ANTIGEN: NEGATIVE

## 2016-01-25 MED ORDER — LEVOTHYROXINE SODIUM 25 MCG PO TABS
25.0000 ug | ORAL_TABLET | Freq: Every day | ORAL | Status: DC
  Administered 2016-01-26 – 2016-02-01 (×7): 25 ug via ORAL
  Filled 2016-01-25 (×7): qty 1

## 2016-01-25 MED ORDER — TRACE MINERALS CR-CU-MN-SE-ZN 10-1000-500-60 MCG/ML IV SOLN
INTRAVENOUS | Status: DC
  Administered 2016-01-25: 17:00:00 via INTRAVENOUS
  Filled 2016-01-25: qty 960

## 2016-01-25 MED ORDER — SACCHAROMYCES BOULARDII 250 MG PO CAPS
250.0000 mg | ORAL_CAPSULE | Freq: Two times a day (BID) | ORAL | Status: DC
  Administered 2016-01-25 – 2016-02-02 (×17): 250 mg via ORAL
  Filled 2016-01-25 (×16): qty 1

## 2016-01-25 MED ORDER — FAT EMULSION 20 % IV EMUL
240.0000 mL | INTRAVENOUS | Status: DC
  Administered 2016-01-25: 240 mL via INTRAVENOUS
  Filled 2016-01-25: qty 250

## 2016-01-25 MED ORDER — INSULIN ASPART 100 UNIT/ML ~~LOC~~ SOLN
0.0000 [IU] | SUBCUTANEOUS | Status: DC
  Administered 2016-01-25: 2 [IU] via SUBCUTANEOUS
  Administered 2016-01-25: 3 [IU] via SUBCUTANEOUS
  Administered 2016-01-25 – 2016-01-29 (×13): 2 [IU] via SUBCUTANEOUS
  Administered 2016-01-29: 3 [IU] via SUBCUTANEOUS
  Administered 2016-01-29 – 2016-01-30 (×5): 2 [IU] via SUBCUTANEOUS

## 2016-01-25 NOTE — Progress Notes (Signed)
PARENTERAL NUTRITION CONSULT NOTE - FOLLOW UP  Pharmacy Consult:  TPN Indication:  Bowel Obstruction   No Known Allergies  Patient Measurements: Height: 5\' 1"  (154.9 cm) Weight: 194 lb 3.6 oz (88.1 kg) IBW/kg (Calculated) : 47.8   Insulin Requirements in the past 24 hours:  27 units of resistant SSI + 30 units regular insulin in TPN  Assessment: 16 YOF presented with SOB and developed epigastric and RUQ pain.  CT scan on 01/12/16 showed high grade SBO.  Patient initially improved with SBO protocol and diet was advanced, but she unfortunately became bloated again and stopped having BM's since 01/16/16.  She underwent ex-lap with LoA on 01/19/16 and Pharmacy consulted to manage TPN for nutritional support.  GI: GERD - has been on and off full liquid/soft diet since admission with poor intake.  Prealbumin decreased to 9.6, likely d/t stress of illness.  Some flatus, +BM.  H2RA in TPN.  Patient reports drinking fluid causes pain d/t the presence of the NG tube. NG tube removed 6/6.  Endo: hypothyroid on IV Synthroid.  DM on glimiperide at home - CBGs adequately controlled, significant SSI use despite insulin in TPN Lytes: all WNL on 6/5 Renal: SCr stable, CrCL 48 ml/min, BUN WNL - good UOP 0.8 ml/kg/hr, Flomax Pulm: asthma - stable on RA - Brovana, Incruse Ellipta, Singulair Cards: CAD, HTN - BP controlled, tachy - ASA, Zetia, Lasix IV, Lopressor IV, Crestor Hepatobil: LFTs, tbili WNL.  TG mildly elevated at 175. Neuro: anxiety - scheduled APAP, Seroquel, clorazepate, Topamax Best Practices: SQ heparin  TPN Access: PICC double lumen placed 01/19/16 TPN start date: 01/19/16  Nutritional Goals:  1700-1900 kCal and 100-120 gm protein daily  Current Nutrition:  TPN Clear liquid diet started 6/5 - consume at most 30% of meals per [employer] Boost BID - none given   Plan:  - Reduce Clinimix E 5/15 to 40 ml/hr (goal rate 83 ml/hr) + continue lipids at 10 ml/hr.  Noted plan to  wean off of TPN tomorrow. - Daily multivitamin and trace elements in TPN - Pepcid 40mg  daily in TPN (d/c bolus) - Continue with 30 units regular insulin in TPN (an increase given reduced TPN rate) + change from resistant to moderate SSI Q4H - F/U daily    Temeca Somma D. Mina Marble, PharmD, BCPS Pager:  [phone] 01/25/2016, 8:51 AM

## 2016-01-25 NOTE — Progress Notes (Signed)
Physical Therapy Treatment Patient Details Name: [name] MRN: [id number] DOB: 10-08-1924 Today's Date: 01/25/2016    History of Present Illness 82 y.o. female adm with 3-4d of SOB, also with abd distension, found to have SBO d/y adhesion;    PMHx: Asthma not on home oxygen, CAD, DM2, hypothyroidism, GERD, CAD, OA, R TKA.  Pt s/p exploratory laparotomy due to partial SBO on 01/19/16.      PT Comments    Pt just ambulated with RN staff into the hallway before PT came.  This AM she was getting back to bed.  She was agreeable to preform seated LE and UE exercises with me now.  Pt would continue to benefit from SNF level rehab at discharge.  PT will continue to follow acutely.   Follow Up Recommendations  SNF     Equipment Recommendations  Other (comment) (shower chair)    Recommendations for Other Services   NA     Precautions / Restrictions Precautions Precautions: Fall Precaution Comments: due to weakness          Cognition Arousal/Alertness: Awake/alert Behavior During Therapy: WFL for tasks assessed/performed Overall Cognitive Status: Within Functional Limits for tasks assessed                      Exercises General Exercises - Upper Extremity Shoulder Flexion: AROM;Both;10 reps Elbow Flexion: AROM;Both;10 reps General Exercises - Lower Extremity Ankle Circles/Pumps: AROM;Both;20 reps Long Arc Quad: AROM;Both;10 reps Hip ABduction/ADduction: AROM;Both;10 reps Hip Flexion/Marching: AROM;Both;10 reps    General Comments General comments (skin integrity, edema, etc.): Pt just finished walking with RN staff into the hallway with RW.  Agreeable to LE/UE exercises.        Pertinent Vitals/Pain Pain Assessment: Faces Faces Pain Scale: Hurts even more Pain Location: abdomen Pain Descriptors / Indicators: Grimacing;Guarding Pain Intervention(s): Limited activity within patient's tolerance;Monitored during session;Repositioned;Patient requesting pain meds-RN  notified           PT Goals (current goals can now be found in the care plan section) Acute Rehab PT Goals Patient Stated Goal: return to being independent Progress towards PT goals: Progressing toward goals    Frequency  Min 2X/week (would benefit from more)    PT Plan Current plan remains appropriate       End of Session Equipment Utilized During Treatment: Gait belt Activity Tolerance: Patient limited by pain Patient left: in chair;with call bell/phone within reach;with family/visitor present     Time: [id number] PT Time Calculation (min) (ACUTE ONLY): 10 min  Charges:  $Therapeutic Exercise: 8-22 mins                      Marissa Weaver B. [place], [place], DPT [phone]   01/25/2016, 4:43 PM

## 2016-01-25 NOTE — Progress Notes (Signed)
6 Days Post-Op  Subjective: Having loose stools this AM and a couple BM's yesterday.  Foley is still in.  Still doesn't want to walk very much.  I am sure she is tired.  i think she anticipates all her progress will occur in rehab.     Objective: Vital signs in last 24 hours: Temp:  [98.3 F (36.8 C)-99.1 F (37.3 C)] 98.3 F (36.8 C) (06/07 0258) Pulse Rate:  [110-116] 111 (06/06 2040) Resp:  [16-22] 16 (06/06 2040) BP: (107-131)/(38-58) 128/58 mmHg (06/07 0552) SpO2:  [96 %-100 %] 96 % (06/06 2040) Weight:  [88.1 kg (194 lb 3.6 oz)] 88.1 kg (194 lb 3.6 oz) (06/07 0325) Last BM Date: 01/24/16 2202 IV fluids\ fluid balance 1316 600 PO Urine 1650  Stools x 2  Afebrile VSS still tachycardic NO labs   Intake/Output from previous day: 06/06 0701 - 06/07 0700 In: 2202.4 [P.O.:600; I.V.:10; IV Piggyback:500; TPN:1092.4] Out: 1652 [Urine:1650; Stool:2] Intake/Output this shift:    General appearance: alert, cooperative and no distress Resp: few rales at the bases GI: soft, sore, incision looks fine, she has foley and incontinent of stool this AM.  Lab Results:   Recent Labs  01/23/16 0520 01/24/16 0420  WBC 8.3 7.2  HGB 7.5* 7.7*  HCT 24.4* 25.3*  PLT 237 245    BMET  Recent Labs  01/22/16 1534 01/23/16 0520  NA 133* 142  K 5.1 3.8  CL 103 108  CO2 23 27  GLUCOSE 656* 167*  BUN 19 20  CREATININE 0.67 0.64  CALCIUM 8.5* 8.4*   PT/INR No results for input(s): LABPROT, INR in the last 72 hours.   Recent Labs Lab 01/19/16 0452 01/23/16 0520  AST 39 24  ALT 49 24  ALKPHOS 48 43  BILITOT 0.1* 0.3  PROT 5.3* 4.9*  ALBUMIN 2.2* 1.9*     Lipase     Component Value Date/Time   LIPASE 33 08/04/2013 0010     Studies/Results: Dg Chest 2 View  01/24/2016  CLINICAL DATA:  Abdominal pain postoperatively EXAM: CHEST  2 VIEW COMPARISON:  01/16/2016 FINDINGS: Cardiac silhouette unchanged. Right PICC line identified with tip to the cavoatrial junction. No  pneumothorax. Vascular pattern normal. Lungs clear. Trace pleural fluid bilaterally. IMPRESSION: Tiny pleural effusions otherwise negative Electronically Signed   By: Skipper Cliche M.D.   On: 01/24/2016 11:35    Medications: . acetaminophen  650 mg Oral Q6H  . antiseptic oral rinse  7 mL Mouth Rinse BID  . arformoterol  15 mcg Nebulization BID   And  . umeclidinium bromide  1 puff Inhalation Daily  . aspirin  300 mg Rectal QHS  . azelastine  1 spray Each Nare BID  . clorazepate  15 mg Oral QHS  . ezetimibe  10 mg Oral Daily  . feeding supplement  1 Container Oral BID BM  . furosemide  20 mg Intravenous Q48H  . heparin subcutaneous  5,000 Units Subcutaneous Q8H  . insulin aspart  0-20 Units Subcutaneous Q4H  . levothyroxine  12.5 mcg Intravenous Daily  . metoprolol  5 mg Intravenous Q6H  . montelukast  10 mg Oral QHS  . QUEtiapine  50 mg Oral Q breakfast  . rosuvastatin  20 mg Oral QHS  . sodium chloride flush  10-40 mL Intracatheter Q12H  . sodium chloride flush  3 mL Intravenous Q12H  . tamsulosin  0.4 mg Oral QPC breakfast  . topiramate  25 mg Oral QHS    Assessment/Plan  Partial small bowel Obstruction S/p Diagnostic laparoscopy converted to exploratory laparotomy, LYSIS OF ADHESIONS, 01/19/16, Dr. Greer Pickerel  POD 6 RLL pneumonia, community acquired with sepsis - treatment completed AODM type II CAD/minimally elevated troponin Acute Kidney injury/urinary retention  - foley in place/day 3 Flomax Hypothyroid Hypertension Hx of asthma Anemia Malnutrition - prealbumin 9.6 01/23/16 FEN: clears [name] ID: 8 days of Ceftriaxone/azithromycin completed 01/18/16; Cefotetan pre op 01/19/16 DVT: Heparin/SCD   Plan:  i am just going to put her up to a soft diet.  I think we can wean off the TNA tomorrow.  I would like to get the foley out and at least get her up to bedside commode if not the [place] room for toileting.  I will discuss with Dr. Doyle Askew later today also.      LOS: 14 days     [name] 01/25/2016 [phone]

## 2016-01-25 NOTE — Progress Notes (Signed)
PROGRESS NOTE  [name]  [id number]:[id number] DOB: 1925/01/16  DOA: 01/11/2016   PCP: [name], MD   Brief Narrative:  82 year old female patient with a PMH of asthma, CAD, chronic diastolic CHF, DM, hypothyroid, GERD, HTN, presented to [facility] ED on 01/11/16 with 3 - 4 day history of dyspnea and productive cough, failed outpatient azithromycin, admitted with diagnosis of sepsis secondary to LLL CAP. Completed treatment for pneumonia. Persistent PSBO despite conservative management for several days. Status post diagnostic laparoscopy converted to exploratory laparotomy, lysis of adhesions 01/19/16 and transferred to stepdown unit postoperatively. Ongoing postop ileus with subsequent loose stools.   Assessment & Plan: Sepsis due to Right lower lobe community-acquired pneumonia, unknown pathogen  - Met sepsis criteria on admission. - Started empirically on Rocephin and azithromycin. Initially admitted to stepdown unit. - Blood cultures 2: Negative. Urine culture times one: Negative. Lactate: 1.9 > 1.2. Pro-calcitonin 0.26. - Has completed 8 days of IV antibiotics and antibiotics have been discontinued. - Sepsis resolved. Pt remains afebrile and with WBC WNL  Partial Small bowel obstruction, S/P diagnostic laparoscopy converted to exploratory laparotomy, lysis of adhesions 01/19/16  - s/p laparotomy and LOA on 01/19/16 - plan to decrease TNA and advance diet to clears to see how pt does - currently with multiple loose stools, C. Diff requested   Acute kidney injury  - Presented with creatinine of 2.49. - pre renal etiology in the setting of acute illness, sepsis - resolved with IVF   Acute respiratory failure with hypoxia - Secondary to community-acquired pneumonia. Resolved.  - Continue to provide pulmonary toilet, incentive spirometry and mobilization encouraged   Acute blood loss anemia - Hemoglobin has gradually dropped from 9.3 on 6/2 > 7.5 on 6/5.  - No overt bleeding. No CBC this AM but will  repeat in AM and if Hg still low, will consider transfusing   Type II DM - Good inpatient control. - Patient on insulin via TPN and resistant NovoLog SSI  Chronic diastolic CHF - with mild LE edema and rales at bases - weight is also up from admission from 188 lbs --> 194 lbs - will continue to monitor daily weights, strict I/O  - may need to lower rate of IVF  Hypothyroid - Continue Synthroid . Continue to provide meds via IV while until pt able to tolerate PO  Essential hypertension - Controlled on metoprolol-changed to IV while nothing by mouth..  Hyperlipidemia -Statins when pt able to take PO   GERD - PPI IV  Anxiety & depression  - Continue home medications PO once able to take PO   CAD - Minimally elevated troponin but flat trend.  - most likely due to demand ischemia related to acute illness. Aspirin resumed - plavix has been on hold pre op and still on hold - worried about drop in Hg, consider restarting Plavix in next 24 hours if Hg stable  - Cardiology had seen for preop clearance.  Hypokalemia - Resolved.  - no BMP this AM - will repeat in AM  Acute urinary retention - Foley catheter had been removed postop on 6/3 but had to be replaced on 6/4 due to recurrent urinary retention.  - completed three days of Flomax, plan on removing foley today with trial of voiding   Asthma - Stable respiratory status, maintaining oxygen saturation at target range   Chronic LBBB, sinus tachycardia - Asymptomatic sinus tachycardia in the 110s.  - continue Metoprolol as needed   Morbid obesity due to  excess calories  - pt meets criteria for morbid obesity with BMI > 35 and underlying risk factors of HTN, HLD, DM - Body mass index is 36.72 kg/(m^2).   DVT prophylaxis: SCDs, subcutaneous heparin Code Status: Full Family Communication: Discussed with patient and family at bedside. Disposition Plan: DC to SNF when cleared by surgery team. If not tachy in next 24 hours,  can transfer to tele in AM.    Consultants:   General surgery  Cardiology.  Procedures:   NG tube  Foley catheter-DC 6/3. In and out cath when necessary  Right upper arm PICC 01/19/16 >  Status post diagnostic laparoscopy converted to exploratory laparotomy, lysis of adhesions 01/19/16   Antimicrobials:   IV Rocephin 5/24 > 5/31   Azithromycin  5/24 > 5/31    Subjective: Reports several episodes of watery stools.   Objective:  Filed Vitals:   01/25/16 1206 01/25/16 1207 01/25/16 1402 01/25/16 1408  BP:  122/43 118/35   Pulse:  110 99   Temp: 98 F (36.7 C)   98.7 F (37.1 C)  TempSrc: Oral   Oral  Resp:  17 23   Height:      Weight:      SpO2:  96% 99%     Intake/Output Summary (Last 24 hours) at 01/25/16 1837 Last data filed at 01/25/16 1409  Gross per 24 hour  Intake 1281.35 ml  Output   2350 ml  Net -1068.65 ml   Filed Weights   01/23/16 1054 01/24/16 0400 01/25/16 0325  Weight: 89.5 kg (197 lb 5 oz) 88.6 kg (195 lb 5.2 oz) 88.1 kg (194 lb 3.6 oz)    Examination:  General exam: Small built and frail pleasant elderly female lying comfortably supine in bed. Respiratory system: diminished breath sounds at bases with rales, cough noted but non productive  Cardiovascular system: S1 & S2 heard, tachy. No JVD, murmurs, rubs, gallops or clicks. Gastrointestinal system: mildly distended, laparotomy site clean and dry, epigastric area slightly TTP, good bowel sounds  Central nervous system: Alert and oriented. No focal neurological deficits. Extremities: Symmetric 5 x 5 power. Skin: No rashes, lesions or ulcers  Data Reviewed: I have personally reviewed following labs and imaging studies  CBC:  Recent Labs Lab 01/19/16 0452 01/20/16 0500 01/22/16 1534 01/23/16 0520 01/24/16 0420  WBC 8.7 9.3 11.8* 8.3 7.2  NEUTROABS 6.0  --   --  5.3  --   HGB 9.5* 9.3* 8.3* 7.5* 7.7*  HCT 31.9* 30.4* 26.5* 24.4* 25.3*  MCV 81.8 81.5 82.8 81.3 82.1  PLT 255 245  258 237 482   Basic Metabolic Panel:  Recent Labs Lab 01/19/16 0452 01/20/16 0500 01/22/16 1534 01/23/16 0520  NA 140 141 133* 142  K 4.0 3.8 5.1 3.8  CL 107 107 103 108  CO2 _0 GLUCOSE 132* 161* 656* 167*  BUN _1 CREATININE 0.79 0.84 0.67 0.64  CALCIUM 8.8* 8.5* 8.5* 8.4*  MG 1.8 2.0  --  1.9  PHOS 4.2 3.9  --  4.5   Liver Function Tests:  Recent Labs Lab 01/19/16 0452 01/23/16 0520  AST 39 24  ALT 49 24  ALKPHOS 48 43  BILITOT 0.1* 0.3  PROT 5.3* 4.9*  ALBUMIN 2.2* 1.9*   CBG:  Recent Labs Lab 01/24/16 2325 01/25/16 0310 01/25/16 0752 01/25/16 1203 01/25/16 1707  GLUCAP 135* 112* 156* 157* 140*   Lipid Profile:  Recent Labs  01/23/16 0520  TRIG 175*   Recent Results (from the past 240 hour(s))  Surgical pcr screen     Status: None   Collection Time: 01/19/16 11:59 AM  Result Value Ref Range Status   MRSA, PCR NEGATIVE NEGATIVE Final   Staphylococcus aureus NEGATIVE NEGATIVE Final    Radiology Studies: Dg Chest 2 View  01/24/2016  CLINICAL DATA:  Abdominal pain postoperatively EXAM: CHEST  2 VIEW COMPARISON:  01/16/2016 FINDINGS: Cardiac silhouette unchanged. Right PICC line identified with tip to the cavoatrial junction. No pneumothorax. Vascular pattern normal. Lungs clear. Trace pleural fluid bilaterally. IMPRESSION: Tiny pleural effusions otherwise negative Electronically Signed   By: Skipper Cliche M.D.   On: 01/24/2016 11:35    Scheduled Meds: . acetaminophen  650 mg Oral Q6H  . antiseptic oral rinse  7 mL Mouth Rinse BID  . arformoterol  15 mcg Nebulization BID   And  . umeclidinium bromide  1 puff Inhalation Daily  . aspirin  300 mg Rectal QHS  . azelastine  1 spray Each Nare BID  . clorazepate  15 mg Oral QHS  . ezetimibe  10 mg Oral Daily  . feeding supplement  1 Container Oral BID BM  . heparin subcutaneous  5,000 Units Subcutaneous Q8H  . insulin aspart  0-15 Units Subcutaneous Q4H  . levothyroxine  12.5 mcg  Intravenous Daily  . metoprolol  5 mg Intravenous Q6H  . montelukast  10 mg Oral QHS  . QUEtiapine  50 mg Oral Q breakfast  . rosuvastatin  20 mg Oral QHS  . saccharomyces boulardii  250 mg Oral BID  . sodium chloride flush  10-40 mL Intracatheter Q12H  . sodium chloride flush  3 mL Intravenous Q12H  . tamsulosin  0.4 mg Oral QPC breakfast  . topiramate  25 mg Oral QHS   Continuous Infusions: . Marland KitchenTPN (CLINIMIX-E) Adult 40 mL/hr at 01/25/16 1711   And  . fat emulsion 240 mL (01/25/16 1712)     LOS: 14 days   Time spent: 20 minutes.  Faye Ramsay, MD [facility] Pager [phone]  If 7PM-7AM, please contact night-coverage [email or web address] Password [facility] 01/25/2016, 6:37 PM

## 2016-01-25 NOTE — Progress Notes (Signed)
3 stools recorded so far today, I will check C diff, I started her on Probiotic earlier today. She is also getting lasix every 48 hours, which may be why she is so negative on fluid balance.  i have stopped this for now.  D/c foley in AM.

## 2016-01-26 LAB — COMPREHENSIVE METABOLIC PANEL
ALK PHOS: 56 U/L (ref 38–126)
ALT: 60 U/L — AB (ref 14–54)
AST: 77 U/L — AB (ref 15–41)
Albumin: 2.1 g/dL — ABNORMAL LOW (ref 3.5–5.0)
Anion gap: 6 (ref 5–15)
BUN: 21 mg/dL — AB (ref 6–20)
CALCIUM: 8.7 mg/dL — AB (ref 8.9–10.3)
CHLORIDE: 104 mmol/L (ref 101–111)
CO2: 27 mmol/L (ref 22–32)
CREATININE: 0.81 mg/dL (ref 0.44–1.00)
GFR calc non Af Amer: >60 mL/min (ref >60)
Glucose, Bld: 129 mg/dL — ABNORMAL HIGH (ref 65–99)
Potassium: 4.3 mmol/L (ref 3.5–5.1)
SODIUM: 137 mmol/L (ref 135–145)
Total Bilirubin: 0.4 mg/dL (ref 0.3–1.2)
Total Protein: 5.3 g/dL — ABNORMAL LOW (ref 6.5–8.1)

## 2016-01-26 LAB — GLUCOSE, CAPILLARY
GLUCOSE-CAPILLARY: 148 mg/dL — AB (ref 65–99)
GLUCOSE-CAPILLARY: 131 mg/dL — AB (ref 65–99)
Glucose-Capillary: 123 mg/dL — ABNORMAL HIGH (ref 65–99)
Glucose-Capillary: 135 mg/dL — ABNORMAL HIGH (ref 65–99)
Glucose-Capillary: 135 mg/dL — ABNORMAL HIGH (ref 65–99)
Glucose-Capillary: 124 mg/dL — ABNORMAL HIGH (ref 65–99)

## 2016-01-26 LAB — CBC
HEMATOCRIT: 25.3 % — AB (ref 36.0–46.0)
HEMOGLOBIN: 7.7 g/dL — AB (ref 12.0–15.0)
MCH: 24.8 pg — AB (ref 26.0–34.0)
MCHC: 30.4 g/dL (ref 30.0–36.0)
MCV: 81.4 fL (ref 78.0–100.0)
Platelets: 228 10*3/uL (ref 150–400)
RBC: 3.11 MIL/uL — ABNORMAL LOW (ref 3.87–5.11)
RDW: 15.6 % — AB (ref 11.5–15.5)
WBC: 5.9 10*3/uL (ref 4.0–10.5)

## 2016-01-26 LAB — PHOSPHORUS: PHOSPHORUS: 4.7 mg/dL — AB (ref 2.5–4.6)

## 2016-01-26 LAB — MAGNESIUM: Magnesium: 2 mg/dL (ref 1.7–2.4)

## 2016-01-26 MED ORDER — SODIUM CHLORIDE 0.9 % IV BOLUS (SEPSIS)
500.0000 mL | Freq: Once | INTRAVENOUS | Status: AC
  Administered 2016-01-26: 500 mL via INTRAVENOUS

## 2016-01-26 MED ORDER — FERROUS SULFATE 325 (65 FE) MG PO TABS
325.0000 mg | ORAL_TABLET | Freq: Every day | ORAL | Status: DC
  Administered 2016-01-26 – 2016-02-01 (×7): 325 mg via ORAL
  Filled 2016-01-26 (×7): qty 1

## 2016-01-26 MED ORDER — TAB-A-VITE/IRON PO TABS
1.0000 | ORAL_TABLET | Freq: Every day | ORAL | Status: DC
  Administered 2016-01-26 – 2016-02-01 (×7): 1 via ORAL
  Filled 2016-01-26 (×8): qty 1

## 2016-01-26 MED ORDER — BISMUTH SUBSALICYLATE 262 MG/15ML PO SUSP
30.0000 mL | Freq: Four times a day (QID) | ORAL | Status: DC | PRN
  Filled 2016-01-26: qty 118

## 2016-01-26 MED ORDER — SIMETHICONE 40 MG/0.6ML PO SUSP
40.0000 mg | Freq: Four times a day (QID) | ORAL | Status: DC | PRN
  Administered 2016-01-26 – 2016-01-27 (×4): 40 mg via ORAL
  Filled 2016-01-26 (×6): qty 0.6

## 2016-01-26 NOTE — Progress Notes (Signed)
PROGRESS NOTE  [name]  [id number]:[id number] DOB: 11/20/1924  DOA: 01/11/2016   PCP: [name], MD   Brief Narrative:  82 year old female patient with a PMH of asthma, CAD, chronic diastolic CHF, DM, hypothyroid, GERD, HTN, presented to [facility] ED on 01/11/16 with 3 - 4 day history of dyspnea and productive cough, failed outpatient azithromycin, admitted with diagnosis of sepsis secondary to LLL CAP. Completed treatment for pneumonia. Persistent PSBO despite conservative management for several days. Status post diagnostic laparoscopy converted to exploratory laparotomy, lysis of adhesions 01/19/16 and transferred to stepdown unit postoperatively. Ongoing postop ileus with subsequent loose stools.   Assessment & Plan: Sepsis due to Right lower lobe community-acquired pneumonia, unknown pathogen  - Met sepsis criteria on admission. - Started empirically on Rocephin and azithromycin. Initially admitted to stepdown unit. - Blood cultures 2: Negative. Urine culture times one: Negative. Lactate: 1.9 > 1.2. Pro-calcitonin 0.26. - Has completed 8 days of IV antibiotics and antibiotics have been discontinued. - Sepsis resolved. Pt remains afebrile and with WBC WNL  Partial Small bowel obstruction, S/P diagnostic laparoscopy converted to exploratory laparotomy, lysis of adhesions 01/19/16  - s/p laparotomy and LOA on 01/19/16 - advanced diet but pt still with loose stools, C. Diff negative   Acute kidney injury  - Presented with creatinine of 2.49. - pre renal etiology in the setting of acute illness, sepsis - resolved with IVF   Acute respiratory failure with hypoxia - Secondary to community-acquired pneumonia. Resolved.  - Continue to provide pulmonary toilet, incentive spirometry and mobilization encouraged  - pt still with cough but says it is better   Acute blood loss anemia - Hemoglobin has gradually dropped from 9.3 on 6/2 > 7.5 on 6/5.  - Hg 7.7 this am, no signs of bleeding - CBC in AM  Type  II DM - Good inpatient control. - Patient on insulin via TPN and resistant NovoLog SSI  Chronic diastolic CHF - with mild LE edema and rales at bases - weight is also up from admission from 188 lbs --> 194 lbs --> 184 lbs  - will continue to monitor daily weights, strict I/O   Hypothyroid - Continue Synthroid . Continue to provide meds via IV while until pt able to tolerate PO  Essential hypertension - Controlled on metoprolol, plan to change to PO in next 24 hours   Hyperlipidemia - Statins to be continued    GERD - PPI IV  Anxiety & depression  - Continue home medications   CAD - Minimally elevated troponin but flat trend.  - most likely due to demand ischemia related to acute illness. Aspirin resumed - plavix has been on hold pre op and still on hold - worried about drop in Hg, consider restarting Plavix if Hg stable and if OK with surgery team  - Cardiology had seen for preop clearance.  Hypokalemia - Resolved.  - BMP in AM  Acute urinary retention - Foley catheter had been removed postop on 6/3 but had to be replaced on 6/4 due to recurrent urinary retention.  - completed three days of Flomax, removed foley   Asthma - Stable respiratory status, maintaining oxygen saturation at target range   Chronic LBBB, sinus tachycardia - Asymptomatic sinus tachycardia in the 110s.  - continue Metoprolol as needed   Morbid obesity due to excess calories  - pt meets criteria for morbid obesity with BMI > 35 and underlying risk factors of HTN, HLD, DM - Body mass index is 34.84  kg/(m^2).   DVT prophylaxis: SCDs, subcutaneous heparin Code Status: Full Family Communication: Discussed with patient and family at bedside. Disposition Plan: DC to SNF when cleared by surgery team. Keep in SDU today    Consultants:   General surgery  Cardiology.  Procedures:   NG tube  Foley catheter-DC 6/3. In and out cath when necessary  Right upper arm PICC 01/19/16 >  Status post  diagnostic laparoscopy converted to exploratory laparotomy, lysis of adhesions 01/19/16   Antimicrobials:   IV Rocephin 5/24 > 5/31   Azithromycin  5/24 > 5/31    Subjective: Reports several episodes of watery stools.   Objective:  Filed Vitals:   01/26/16 0810 01/26/16 1054 01/26/16 1135 01/26/16 1556  BP:  111/41    Pulse:  110    Temp:   98.6 F (37 C) 97.7 F (36.5 C)  TempSrc:   Oral Oral  Resp:  20    Height:      Weight:      SpO2: 100% 97%      Intake/Output Summary (Last 24 hours) at 01/26/16 1841 Last data filed at 01/26/16 1708  Gross per 24 hour  Intake 940.67 ml  Output   2027 ml  Net -1086.33 ml   Filed Weights   01/24/16 0400 01/25/16 0325 01/26/16 0500  Weight: 88.6 kg (195 lb 5.2 oz) 88.1 kg (194 lb 3.6 oz) 83.6 kg (184 lb 4.9 oz)    Examination:  General exam: Small built and frail pleasant elderly female lying comfortably supine in bed. Respiratory system: diminished breath sounds at bases with rales, cough noted but non productive  Cardiovascular system: S1 & S2 heard, tachy. No JVD, murmurs, rubs, gallops or clicks. Gastrointestinal system: mildly distended, laparotomy site clean and dry, epigastric area slightly TTP, good bowel sounds  Central nervous system: Alert and oriented. No focal neurological deficits.  Data Reviewed: I have personally reviewed following labs and imaging studies  CBC:  Recent Labs Lab 01/20/16 0500 01/22/16 1534 01/23/16 0520 01/24/16 0420 01/26/16 0358  WBC 9.3 11.8* 8.3 7.2 5.9  NEUTROABS  --   --  5.3  --   --   HGB 9.3* 8.3* 7.5* 7.7* 7.7*  HCT 30.4* 26.5* 24.4* 25.3* 25.3*  MCV 81.5 82.8 81.3 82.1 81.4  PLT 245 258 237 245 076   Basic Metabolic Panel:  Recent Labs Lab 01/20/16 0500 01/22/16 1534 01/23/16 0520 01/26/16 0358  NA 141 133* 142 137  K 3.8 5.1 3.8 4.3  CL 107 103 108 104  CO2 27 23 27 27   GLUCOSE 161* 656* 167* 129*  BUN 9 19 20  21*  CREATININE 0.84 0.67 0.64 0.81  CALCIUM  8.5* 8.5* 8.4* 8.7*  MG 2.0  --  1.9 2.0  PHOS 3.9  --  4.5 4.7*   Liver Function Tests:  Recent Labs Lab 01/23/16 0520 01/26/16 0358  AST 24 77*  ALT 24 60*  ALKPHOS 43 56  BILITOT 0.3 0.4  PROT 4.9* 5.3*  ALBUMIN 1.9* 2.1*   CBG:  Recent Labs Lab 01/25/16 2333 01/26/16 0348 01/26/16 0747 01/26/16 1131 01/26/16 1555  GLUCAP 113* 123* 148* 131* 135*    Recent Results (from the past 240 hour(s))  Surgical pcr screen     Status: None   Collection Time: 01/19/16 11:59 AM  Result Value Ref Range Status   MRSA, PCR NEGATIVE NEGATIVE Final   Staphylococcus aureus NEGATIVE NEGATIVE Final    Radiology Studies: No results found.  Scheduled Meds: .  acetaminophen  650 mg Oral Q6H  . antiseptic oral rinse  7 mL Mouth Rinse BID  . arformoterol  15 mcg Nebulization BID   And  . umeclidinium bromide  1 puff Inhalation Daily  . azelastine  1 spray Each Nare BID  . clorazepate  15 mg Oral QHS  . ezetimibe  10 mg Oral Daily  . ferrous sulfate  325 mg Oral QPC supper  . heparin subcutaneous  5,000 Units Subcutaneous Q8H  . insulin aspart  0-15 Units Subcutaneous Q4H  . levothyroxine  25 mcg Oral QAC breakfast  . metoprolol  5 mg Intravenous Q6H  . montelukast  10 mg Oral QHS  . multivitamins with iron  1 tablet Oral QPC lunch  . QUEtiapine  50 mg Oral Q breakfast  . rosuvastatin  20 mg Oral QHS  . saccharomyces boulardii  250 mg Oral BID  . sodium chloride flush  10-40 mL Intracatheter Q12H  . sodium chloride flush  3 mL Intravenous Q12H  . tamsulosin  0.4 mg Oral QPC breakfast  . topiramate  25 mg Oral QHS   Continuous Infusions:     LOS: 15 days   Time spent: 20 minutes.  Faye Ramsay, MD [facility] Pager [phone]  If 7PM-7AM, please contact night-coverage [email or web address] Password [facility] 01/26/2016, 6:41 PM

## 2016-01-26 NOTE — Care Management Important Message (Signed)
Important Message  Patient Details  Name: [name] MRN: [id number] Date of Birth: 11/24/1924   Medicare Important Message Given:  Yes    [name] 01/26/2016, 10:39 AM

## 2016-01-26 NOTE — Progress Notes (Signed)
Nutrition Follow-up  DOCUMENTATION CODES:   Non-severe (moderate) malnutrition in context of chronic illness, Obesity unspecified  INTERVENTION:  Discontinue Boost Breeze.  Snacks BID.  Encourage PO's.   NUTRITION DIAGNOSIS:   Malnutrition related to chronic illness as evidenced by mild depletion of muscle mass, percent weight loss (10% weight loss within 6 months).  Ongoing   GOAL:   Patient will meet greater than or equal to 90% of their needs  Unmet   MONITOR:   PO intake, Labs, Weight trends, Skin, I & O's  ASSESSMENT:   82 y.o. female with a Past Medical History of asthma, CAD, DM, hypogonadism, GERD, CAD, who presents with sepsis from CAP and possible UTI.  C/s for assessment d/t diarrhea. Discontinue Boost Breeze d/t high osmolality.  Foley is out.  Diet advanced to carb modified soft/fluid diet on 6/7. She is tolerating it well. Will order snacks to supplement diet until stools are controlled. PO's 0-25%. Encourage PO's.   Weaning TPN  Plan per pharm 6/7:  - Reduce Clinimix E 5/15 to 40 ml/hr (goal rate 83 ml/hr) + continue lipids at 10 ml/hr. Noted plan to wean off of TPN tomorrow.  Labs reviewed; BUN 21, Ca 8.7, CBGs 112-171, phos 4.7. Meds reviewed; ferrous sulfate, MVI w/ iron.   Diet Order:  Diet Carb Modified Fluid consistency:: Thin; Room service appropriate?: Yes  Skin:  Reviewed, no issues  Last BM:  6/7  Height:   Ht Readings from Last 1 Encounters:  01/19/16 5\' 1"  (1.549 m)    Weight:   Wt Readings from Last 1 Encounters:  01/26/16 184 lb 4.9 oz (83.6 kg)    Ideal Body Weight:  47.7 kg  BMI:  Body mass index is 34.84 kg/(m^2).  Estimated Nutritional Needs:   Kcal:  1700-1900  Protein:  100-120 gm  Fluid:  1.7-2 L  EDUCATION NEEDS:   No education needs identified at this time  Geoffery Lyons, [place] [facility] Intern Pager [phone]

## 2016-01-26 NOTE — Progress Notes (Signed)
7 Days Post-Op  Subjective: Still having multiple loose stools, it just not recorded.  She does not have C diff.  Her foley is out and waiting to see if she can void with it out.  Tolerating soft carb mod diet well.    Objective: Vital signs in last 24 hours: Temp:  [98 F (36.7 C)-98.7 F (37.1 C)] 98.3 F (36.8 C) (06/08 0748) Pulse Rate:  [87-112] 103 (06/08 0715) Resp:  [16-23] 16 (06/08 0715) BP: (110-125)/(35-48) 116/38 mmHg (06/08 0715) SpO2:  [94 %-100 %] 100 % (06/08 0810) Weight:  [83.6 kg (184 lb 4.9 oz)] 83.6 kg (184 lb 4.9 oz) (06/08 0500) Last BM Date: 01/25/16 3 stools yesterday  780 PO 2550 urine Afebrile, BP stable , tachycardia a little better. Labs OK H/H is stable Intake/Output from previous day: 06/07 0701 - 06/08 0700 In: 2573.3 [P.O.:780; TPN:1793.3] Out: 2550 [Urine:2550] Intake/Output this shift: Total I/O In: 120 [P.O.:120] Out: 1 [Stool:1]  General appearance: alert, cooperative and no distress Resp: still has a few rales at the base.   GI: soft, sites looks fine, tolerating diet, still having allot of diarrhea.    Lab Results:   Recent Labs  01/24/16 0420 01/26/16 0358  WBC 7.2 5.9  HGB 7.7* 7.7*  HCT 25.3* 25.3*  PLT 245 228    BMET  Recent Labs  01/26/16 0358  NA 137  K 4.3  CL 104  CO2 27  GLUCOSE 129*  BUN 21*  CREATININE 0.81  CALCIUM 8.7*   PT/INR No results for input(s): LABPROT, INR in the last 72 hours.   Recent Labs Lab 01/23/16 0520 01/26/16 0358  AST 24 77*  ALT 24 60*  ALKPHOS 43 56  BILITOT 0.3 0.4  PROT 4.9* 5.3*  ALBUMIN 1.9* 2.1*     Lipase     Component Value Date/Time   LIPASE 33 08/04/2013 0010     Studies/Results: Dg Chest 2 View  01/24/2016  CLINICAL DATA:  Abdominal pain postoperatively EXAM: CHEST  2 VIEW COMPARISON:  01/16/2016 FINDINGS: Cardiac silhouette unchanged. Right PICC line identified with tip to the cavoatrial junction. No pneumothorax. Vascular pattern normal. Lungs  clear. Trace pleural fluid bilaterally. IMPRESSION: Tiny pleural effusions otherwise negative Electronically Signed   By: Skipper Cliche M.D.   On: 01/24/2016 11:35    Medications: . acetaminophen  650 mg Oral Q6H  . antiseptic oral rinse  7 mL Mouth Rinse BID  . arformoterol  15 mcg Nebulization BID   And  . umeclidinium bromide  1 puff Inhalation Daily  . aspirin  300 mg Rectal QHS  . azelastine  1 spray Each Nare BID  . clorazepate  15 mg Oral QHS  . ezetimibe  10 mg Oral Daily  . feeding supplement  1 Container Oral BID BM  . heparin subcutaneous  5,000 Units Subcutaneous Q8H  . insulin aspart  0-15 Units Subcutaneous Q4H  . levothyroxine  25 mcg Oral QAC breakfast  . metoprolol  5 mg Intravenous Q6H  . montelukast  10 mg Oral QHS  . QUEtiapine  50 mg Oral Q breakfast  . rosuvastatin  20 mg Oral QHS  . saccharomyces boulardii  250 mg Oral BID  . sodium chloride flush  10-40 mL Intracatheter Q12H  . sodium chloride flush  3 mL Intravenous Q12H  . tamsulosin  0.4 mg Oral QPC breakfast  . topiramate  25 mg Oral QHS   . Marland KitchenTPN (CLINIMIX-E) Adult 40 mL/hr at 01/26/16 0700  And  . fat emulsion 240 mL (01/26/16 0600)    AODM type II CAD/minimally elevated troponin Hypothyroid Hypertension Hx of asthma   Assessment/Plan Partial small bowel Obstruction S/p Diagnostic laparoscopy converted to exploratory laparotomy, LYSIS OF ADHESIONS, 01/19/16, Dr. Greer Pickerel POD 7 RLL pneumonia, community acquired with sepsis - treatment completed Acute Kidney injury/urinary retention - foley out, 2nd time, waiting to see if she can void/day 4 Flomax Unable to void after 8 hours >400 on bladder scan - foley back in.  Malnutrition - prealbumin 9.6 01/23/16 Persistent tachycardia Diarrhea C diff negative 01/25/16  - still having multiple stools - add kaopectate Anemia - adding FE and MVI FEN: carb modified diet /TNA - I have ask pharmacy to wean   ID: 8 days of Ceftriaxone/azithromycin  completed 01/18/16; Cefotetan pre op 01/19/16 DVT: Heparin/SCD   Plan:  Add some Kaopectate and see if this helps with diarrhea, I think time will fix this also but she is very embarased about having to have someone clean her up and afraid of having loose stool while walking.   Still tachycardic. Continue PT and ambulation as much as diarrhea will allow.       LOS: 15 days    Leelynd Maldonado 01/26/2016 [phone]

## 2016-01-26 NOTE — Progress Notes (Signed)
Patient with redness to pubic area and inner upper thighs with satellite lesions, consistent with fungal rash.  Antifungal powder applied to all affected sites.

## 2016-01-27 LAB — COMPREHENSIVE METABOLIC PANEL
ALT: 52 U/L (ref 14–54)
ANION GAP: 4 — AB (ref 5–15)
AST: 50 U/L — ABNORMAL HIGH (ref 15–41)
Albumin: 2 g/dL — ABNORMAL LOW (ref 3.5–5.0)
Alkaline Phosphatase: 60 U/L (ref 38–126)
BILIRUBIN TOTAL: 0.5 mg/dL (ref 0.3–1.2)
BUN: 17 mg/dL (ref 6–20)
CO2: 25 mmol/L (ref 22–32)
Calcium: 8.4 mg/dL — ABNORMAL LOW (ref 8.9–10.3)
Chloride: 111 mmol/L (ref 101–111)
Creatinine, Ser: 0.81 mg/dL (ref 0.44–1.00)
Glucose, Bld: 112 mg/dL — ABNORMAL HIGH (ref 65–99)
POTASSIUM: 4.2 mmol/L (ref 3.5–5.1)
SODIUM: 140 mmol/L (ref 135–145)
TOTAL PROTEIN: 5.1 g/dL — AB (ref 6.5–8.1)

## 2016-01-27 LAB — CBC
HEMATOCRIT: 24.4 % — AB (ref 36.0–46.0)
Hemoglobin: 7.3 g/dL — ABNORMAL LOW (ref 12.0–15.0)
MCH: 24.4 pg — AB (ref 26.0–34.0)
MCHC: 29.9 g/dL — AB (ref 30.0–36.0)
MCV: 81.6 fL (ref 78.0–100.0)
Platelets: 252 10*3/uL (ref 150–400)
RBC: 2.99 MIL/uL — ABNORMAL LOW (ref 3.87–5.11)
RDW: 15.8 % — AB (ref 11.5–15.5)
WBC: 5.1 10*3/uL (ref 4.0–10.5)

## 2016-01-27 LAB — GLUCOSE, CAPILLARY
GLUCOSE-CAPILLARY: 117 mg/dL — AB (ref 65–99)
GLUCOSE-CAPILLARY: 118 mg/dL — AB (ref 65–99)
GLUCOSE-CAPILLARY: 123 mg/dL — AB (ref 65–99)
Glucose-Capillary: 132 mg/dL — ABNORMAL HIGH (ref 65–99)
Glucose-Capillary: 111 mg/dL — ABNORMAL HIGH (ref 65–99)
Glucose-Capillary: 132 mg/dL — ABNORMAL HIGH (ref 65–99)

## 2016-01-27 LAB — MAGNESIUM: MAGNESIUM: 1.9 mg/dL (ref 1.7–2.4)

## 2016-01-27 MED ORDER — GLUCERNA SHAKE PO LIQD
237.0000 mL | Freq: Three times a day (TID) | ORAL | Status: DC
  Administered 2016-01-27 – 2016-02-02 (×17): 237 mL via ORAL
  Filled 2016-01-27: qty 237

## 2016-01-27 MED ORDER — LOPERAMIDE HCL 2 MG PO CAPS
2.0000 mg | ORAL_CAPSULE | ORAL | Status: DC | PRN
  Administered 2016-01-27 – 2016-01-30 (×5): 2 mg via ORAL
  Filled 2016-01-27 (×5): qty 1

## 2016-01-27 NOTE — Progress Notes (Signed)
Patient ID: [name], female   DOB: 06-25-1925, 82 y.o.   MRN: [id number]     [facility]      [address] [place], [place] [address]    Phone: [phone] FAX: [phone]     Subjective: Still with diarrhea. Tolerating POs. No n/v. Pain controlled. Foley replaced for retention.  Getting up to chair.    Objective:  Vital signs:  Filed Vitals:   01/27/16 0334 01/27/16 0625 01/27/16 0800 01/27/16 0844  BP: 106/36 139/58 129/50   Pulse: 107 108 104   Temp: 98.8 F (37.1 C)  99.3 F (37.4 C)   TempSrc: Oral  Oral   Resp: _0 Height: _1  (1.549 m)     Weight: 85.14 kg (187 lb 11.2 oz)     SpO2: 95% 96% 97% 97%    Last BM Date: 01/27/16  Intake/Output   Yesterday:  06/08 0701 - 06/09 0700 In: 4 [P.O.:480; I.V.:10] Out: 1702 [Urine:1700; Stool:2] This shift:  Total I/O In: -  Out: 300 [Urine:300]   Physical Exam: General: Pt awake/alert/oriented x4 in no acute distress Abdomen: Soft.  Nondistended.  Mildly tender at incisions only. Staples in place, no erythema.  No evidence of peritonitis.  No incarcerated hernias.    Problem List:   Principal Problem:   Sepsis due to pneumonia [facility]) Active Problems:   Diabetes mellitus type 2, controlled ([place])   Coronary atherosclerosis   Asthma with bronchitis   HTN (hypertension)   HLD (hyperlipidemia)   Hypothyroid   GERD (gastroesophageal reflux disease)   Diabetes mellitus type 2 without retinopathy ([place])   Sepsis ([place])   Acute on chronic respiratory failure ([facility])   SBO (small bowel obstruction) ([place])   Hypokalemia   Preop cardiovascular exam    Results:   Labs: Results for orders placed or performed during the hospital encounter of 01/11/16 (from the past 48 hour(s))  Glucose, capillary     Status: Abnormal   Collection Time: 01/25/16 12:03 PM  Result Value Ref Range   Glucose-Capillary 157 (H) 65 - 99 mg/dL   Comment 1 Notify RN     Comment 2 Document in Chart   Glucose, capillary     Status: Abnormal   Collection Time: 01/25/16  5:07 PM  Result Value Ref Range   Glucose-Capillary 140 (H) 65 - 99 mg/dL   Comment 1 Notify RN    Comment 2 Document in Chart   C difficile quick scan w PCR reflex     Status: None   Collection Time: 01/25/16  5:28 PM  Result Value Ref Range   C Diff antigen NEGATIVE NEGATIVE   C Diff toxin NEGATIVE NEGATIVE   C Diff interpretation Negative for toxigenic C. difficile   Glucose, capillary     Status: Abnormal   Collection Time: 01/25/16  7:16 PM  Result Value Ref Range   Glucose-Capillary 131 (H) 65 - 99 mg/dL  Glucose, capillary     Status: Abnormal   Collection Time: 01/25/16 11:33 PM  Result Value Ref Range   Glucose-Capillary 113 (H) 65 - 99 mg/dL  Glucose, capillary     Status: Abnormal   Collection Time: 01/26/16  3:48 AM  Result Value Ref Range   Glucose-Capillary 123 (H) 65 - 99 mg/dL  Comprehensive metabolic panel     Status: Abnormal   Collection Time: 01/26/16  3:58 AM  Result Value Ref Range   Sodium 137  135 - 145 mmol/L   Potassium 4.3 3.5 - 5.1 mmol/L   Chloride 104 101 - 111 mmol/L   CO2 27 22 - 32 mmol/L   Glucose, Bld 129 (H) 65 - 99 mg/dL   BUN 21 (H) 6 - 20 mg/dL   Creatinine, Ser 0.81 0.44 - 1.00 mg/dL   Calcium 8.7 (L) 8.9 - 10.3 mg/dL   Total Protein 5.3 (L) 6.5 - 8.1 g/dL   Albumin 2.1 (L) 3.5 - 5.0 g/dL   AST 77 (H) 15 - 41 U/L   ALT 60 (H) 14 - 54 U/L   Alkaline Phosphatase 56 38 - 126 U/L   Total Bilirubin 0.4 0.3 - 1.2 mg/dL   GFR calc non Af Amer >60 >60 mL/min   GFR calc Af Amer >60 >60 mL/min    Comment: (NOTE) The eGFR has been calculated using the CKD EPI equation. This calculation has not been validated in all clinical situations. eGFR's persistently <60 mL/min signify possible Chronic Kidney Disease.    Anion gap 6 5 - 15  Magnesium     Status: None   Collection Time: 01/26/16  3:58 AM  Result Value Ref Range   Magnesium 2.0 1.7 -  2.4 mg/dL  Phosphorus     Status: Abnormal   Collection Time: 01/26/16  3:58 AM  Result Value Ref Range   Phosphorus 4.7 (H) 2.5 - 4.6 mg/dL  CBC     Status: Abnormal   Collection Time: 01/26/16  3:58 AM  Result Value Ref Range   WBC 5.9 4.0 - 10.5 K/uL   RBC 3.11 (L) 3.87 - 5.11 MIL/uL   Hemoglobin 7.7 (L) 12.0 - 15.0 g/dL   HCT 25.3 (L) 36.0 - 46.0 %   MCV 81.4 78.0 - 100.0 fL   MCH 24.8 (L) 26.0 - 34.0 pg   MCHC 30.4 30.0 - 36.0 g/dL   RDW 15.6 (H) 11.5 - 15.5 %   Platelets 228 150 - 400 K/uL  Glucose, capillary     Status: Abnormal   Collection Time: 01/26/16  7:47 AM  Result Value Ref Range   Glucose-Capillary 148 (H) 65 - 99 mg/dL   Comment 1 Notify RN    Comment 2 Document in Chart   Glucose, capillary     Status: Abnormal   Collection Time: 01/26/16 11:31 AM  Result Value Ref Range   Glucose-Capillary 131 (H) 65 - 99 mg/dL   Comment 1 Notify RN    Comment 2 Document in Chart   Glucose, capillary     Status: Abnormal   Collection Time: 01/26/16  3:55 PM  Result Value Ref Range   Glucose-Capillary 135 (H) 65 - 99 mg/dL   Comment 1 Notify RN    Comment 2 Document in Chart   Glucose, capillary     Status: Abnormal   Collection Time: 01/26/16  7:58 PM  Result Value Ref Range   Glucose-Capillary 135 (H) 65 - 99 mg/dL  Glucose, capillary     Status: Abnormal   Collection Time: 01/26/16 11:24 PM  Result Value Ref Range   Glucose-Capillary 124 (H) 65 - 99 mg/dL  Glucose, capillary     Status: Abnormal   Collection Time: 01/27/16  3:37 AM  Result Value Ref Range   Glucose-Capillary 117 (H) 65 - 99 mg/dL  CBC     Status: Abnormal   Collection Time: 01/27/16  3:54 AM  Result Value Ref Range   WBC 5.1 4.0 - 10.5 K/uL  RBC 2.99 (L) 3.87 - 5.11 MIL/uL   Hemoglobin 7.3 (L) 12.0 - 15.0 g/dL   HCT 24.4 (L) 36.0 - 46.0 %   MCV 81.6 78.0 - 100.0 fL   MCH 24.4 (L) 26.0 - 34.0 pg   MCHC 29.9 (L) 30.0 - 36.0 g/dL   RDW 15.8 (H) 11.5 - 15.5 %   Platelets 252 150 - 400 K/uL   Magnesium     Status: None   Collection Time: 01/27/16  3:54 AM  Result Value Ref Range   Magnesium 1.9 1.7 - 2.4 mg/dL  Comprehensive metabolic panel     Status: Abnormal   Collection Time: 01/27/16  3:54 AM  Result Value Ref Range   Sodium 140 135 - 145 mmol/L   Potassium 4.2 3.5 - 5.1 mmol/L   Chloride 111 101 - 111 mmol/L   CO2 25 22 - 32 mmol/L   Glucose, Bld 112 (H) 65 - 99 mg/dL   BUN 17 6 - 20 mg/dL   Creatinine, Ser 0.81 0.44 - 1.00 mg/dL   Calcium 8.4 (L) 8.9 - 10.3 mg/dL   Total Protein 5.1 (L) 6.5 - 8.1 g/dL   Albumin 2.0 (L) 3.5 - 5.0 g/dL   AST 50 (H) 15 - 41 U/L   ALT 52 14 - 54 U/L   Alkaline Phosphatase 60 38 - 126 U/L   Total Bilirubin 0.5 0.3 - 1.2 mg/dL   GFR calc non Af Amer >60 >60 mL/min   GFR calc Af Amer >60 >60 mL/min    Comment: (NOTE) The eGFR has been calculated using the CKD EPI equation. This calculation has not been validated in all clinical situations. eGFR's persistently <60 mL/min signify possible Chronic Kidney Disease.    Anion gap 4 (L) 5 - 15  Glucose, capillary     Status: Abnormal   Collection Time: 01/27/16  8:00 AM  Result Value Ref Range   Glucose-Capillary 132 (H) 65 - 99 mg/dL    Imaging / Studies: No results found.  Medications / Allergies:  Scheduled Meds: . acetaminophen  650 mg Oral Q6H  . antiseptic oral rinse  7 mL Mouth Rinse BID  . arformoterol  15 mcg Nebulization BID   And  . umeclidinium bromide  1 puff Inhalation Daily  . azelastine  1 spray Each Nare BID  . clorazepate  15 mg Oral QHS  . ezetimibe  10 mg Oral Daily  . feeding supplement (GLUCERNA SHAKE)  237 mL Oral TID BM  . ferrous sulfate  325 mg Oral QPC supper  . heparin subcutaneous  5,000 Units Subcutaneous Q8H  . insulin aspart  0-15 Units Subcutaneous Q4H  . levothyroxine  25 mcg Oral QAC breakfast  . metoprolol  5 mg Intravenous Q6H  . montelukast  10 mg Oral QHS  . multivitamins with iron  1 tablet Oral QPC lunch  . QUEtiapine  50 mg Oral  Q breakfast  . rosuvastatin  20 mg Oral QHS  . saccharomyces boulardii  250 mg Oral BID  . sodium chloride flush  10-40 mL Intracatheter Q12H  . sodium chloride flush  3 mL Intravenous Q12H  . tamsulosin  0.4 mg Oral QPC breakfast  . topiramate  25 mg Oral QHS   Continuous Infusions:  PRN Meds:.albuterol, bismuth subsalicylate, ipratropium-albuterol, LORazepam, morphine injection, nitroGLYCERIN, ondansetron **OR** ondansetron (ZOFRAN) IV, oxyCODONE, phenol, simethicone, sodium chloride flush  Antibiotics: Anti-infectives    Start     Dose/Rate Route Frequency Ordered Stop   01/19/16  1313  cefoTEtan in Dextrose 5% (CEFOTAN) 2-2.08 GM-% IVPB    Comments:  Sammuel Cooper   : cabinet override      01/19/16 1313 01/20/16 0129   01/19/16 1230  cefoTEtan (CEFOTAN) 2 g in dextrose 5 % 50 mL IVPB     2 g 100 mL/hr over 30 Minutes Intravenous To Surgery 01/19/16 1223 01/19/16 1422   01/12/16 0900  cefTRIAXone (ROCEPHIN) 1 g in dextrose 5 % 50 mL IVPB     1 g 100 mL/hr over 30 Minutes Intravenous Every 24 hours 01/11/16 1251 01/18/16 1018   01/12/16 0900  azithromycin (ZITHROMAX) 500 mg in dextrose 5 % 250 mL IVPB     500 mg 250 mL/hr over 60 Minutes Intravenous Every 24 hours 01/11/16 1251 01/18/16 1048   01/11/16 0830  azithromycin (ZITHROMAX) 500 mg in dextrose 5 % 250 mL IVPB     500 mg 250 mL/hr over 60 Minutes Intravenous  Once 01/11/16 0820 01/11/16 0935   01/11/16 0830  cefTRIAXone (ROCEPHIN) 1 g in dextrose 5 % 50 mL IVPB     1 g 100 mL/hr over 30 Minutes Intravenous  Once 01/11/16 0820 01/11/16 0947        Assessment/Plan PSBO OD#8 diagnostic laparoscopy, laparotomy, LOA---Dr. Redmond Pulling 01/19/16 -tolerating POs, up to chair, PT, pain controlled.  Only complaint is diarrhea, suspect post obstructive, watch, should slow down.  -plan to remove staples prior to DC  Urinary retention-voiding trial 2 days, flomax ID-treated for PNA/sepsis.  Diarrhea, c diff negative 6/7.    FEN-appetite is fair, add glucerna shakes  DVT prophylaxis-scd/heparin ABLA-slow drift, no signs of active bleeding.  BP stable but tachy.  Transfuse per primary team.   Erby Pian, ANP-BC [facility] Surgery Pager [phone](7A-4:30P) For consults and floor pages call [phone](7A-4:30P)  01/27/2016 9:05 AM

## 2016-01-27 NOTE — Clinical Social Work Note (Signed)
CSW continues to follow for discharge needs and is regularly updating [place] on progress.  [place]ton Scrape, [place]

## 2016-01-27 NOTE — Progress Notes (Signed)
PROGRESS NOTE  [name]  OYD:[id number] DOB: August 13, 1925  DOA: 01/11/2016   PCP: [name], MD   Brief Narrative:  82 year old female patient with a PMH of asthma, CAD, chronic diastolic CHF, DM, hypothyroid, GERD, HTN, presented to [facility] ED on 01/11/16 with 3 - 4 day history of dyspnea and productive cough, failed outpatient azithromycin, admitted with diagnosis of sepsis secondary to LLL CAP. Completed treatment for pneumonia. Persistent PSBO despite conservative management for several days. Status post diagnostic laparoscopy converted to exploratory laparotomy, lysis of adhesions 01/19/16 and transferred to stepdown unit postoperatively. Ongoing postop ileus with subsequent loose stools.   Assessment & Plan: Sepsis due to Right lower lobe community-acquired pneumonia, unknown pathogen  - Met sepsis criteria on admission. - Started empirically on Rocephin and azithromycin. Initially admitted to stepdown unit. - Blood cultures 2: Negative. Urine culture times one: Negative. Lactate: 1.9 > 1.2. Pro-calcitonin 0.26. - Has completed 8 days of IV antibiotics and antibiotics have been discontinued 5/31 - Sepsis resolved. Pt remains afebrile and with WBC WNL  Partial Small bowel obstruction, S/P diagnostic laparoscopy converted to exploratory laparotomy, lysis of adhesions 01/19/16  - s/p laparotomy and LOA on 01/19/16 - advanced diet but pt still with loose stools, C. Diff negative   Loose stools  - C. Diff negative - Discussed with Dr. Penelope Coop GI specialist - he thinks this is due to recent ABX use - recommended GI stool panel by PCR, adding florastor and imodium as needed  - would not recommend intervention such as colonoscopy given pt's age and recent surgery   Acute kidney injury  - Presented with creatinine of 2.49. - pre renal etiology in the setting of acute illness, sepsis - resolved with IVF   Acute respiratory failure with hypoxia - Secondary to community-acquired pneumonia. Resolved.   - Continue to provide pulmonary toilet, incentive spirometry and mobilization encouraged  - overall better, encourage PT   Acute blood loss anemia - Hemoglobin has gradually dropped from 9.3 on 6/2 > 7.5 on 6/5.  - Hg 7.3 this am, no signs of bleeding - transfuse if Hg < 7 - CBC in AM  Type II DM - Good inpatient control. - Patient on insulin via TPN and resistant NovoLog SSI  Chronic diastolic CHF - with mild LE edema and rales at bases - weight is also up from admission from 188 lbs --> 194 lbs --> 184 lbs  - will continue to monitor daily weights, strict I/O   Hypothyroid - Continue Synthroid . Continue to provide meds via IV while until pt able to tolerate PO  Essential hypertension - Controlled on metoprolol, plan to change to PO in next 24 hours   Hyperlipidemia - Statins to be continued    GERD - PPI IV  Anxiety & depression  - Continue home medications   CAD - plavix has been on hold pre op and still on hold - worried about drop in Hg, consider restarting Plavix if Hg stable and if OK with surgery team  - will check Hg in AM and if >  7, plan to resume Plavix  - Cardiology had seen for preop clearance.  Hypokalemia - Resolved.  - BMP in AM  Acute urinary retention - Foley catheter had been removed postop on 6/3 but had to be replaced on 6/4 due to recurrent urinary retention.  - completed three days of Flomax, removed foley 6/7  Asthma - Stable respiratory status, maintaining oxygen saturation at target range  Chronic LBBB, sinus tachycardia - Asymptomatic sinus tachycardia in the 110s.  - continue Metoprolol as needed   Morbid obesity due to excess calories  - pt meets criteria for morbid obesity with BMI > 35 and underlying risk factors of HTN, HLD, DM - Body mass index is 35.48 kg/(m^2).   DVT prophylaxis: SCDs, subcutaneous heparin Code Status: Full Family Communication: Discussed with patient and family at bedside. Disposition Plan: DC to  SNF when cleared by surgery team. Keep in SDU today    Consultants:   General surgery  Cardiology.  Procedures:   NG tube  Foley catheter-DC 6/3. In and out cath when necessary  Right upper arm PICC 01/19/16 >  Status post diagnostic laparoscopy converted to exploratory laparotomy, lysis of adhesions 01/19/16   Antimicrobials:   IV Rocephin 5/24 > 5/31   Azithromycin  5/24 > 5/31    Subjective: Reports several episodes of watery stools this am and continues throughout the day.   Objective:  Filed Vitals:   01/26/16 2334 01/27/16 0143 01/27/16 0334 01/27/16 0625  BP: 121/44 135/45 106/36 139/58  Pulse: 101 108 107 108  Temp:   98.8 F (37.1 C)   TempSrc:   Oral   Resp: 21 29 19 22   Height:   5' 1"  (1.549 m)   Weight:   85.14 kg (187 lb 11.2 oz)   SpO2: 95% 96% 95% 96%    Intake/Output Summary (Last 24 hours) at 01/27/16 0657 Last data filed at 01/27/16 0341  Gross per 24 hour  Intake    490 ml  Output   1702 ml  Net  -1212 ml   Filed Weights   01/25/16 0325 01/26/16 0500 01/27/16 0334  Weight: 88.1 kg (194 lb 3.6 oz) 83.6 kg (184 lb 4.9 oz) 85.14 kg (187 lb 11.2 oz)    Examination:  General exam: Small built and frail pleasant elderly female lying comfortably supine in bed. Respiratory system: diminished breath sounds at bases with rales, cough noted but non productive  Cardiovascular system: S1 & S2 heard, tachy. No JVD, murmurs, rubs, gallops or clicks. Gastrointestinal system: mildly distended, laparotomy site clean and dry, epigastric area non tender, good bowel sounds  Central nervous system: Alert and oriented. No focal neurological deficits.  Data Reviewed: I have personally reviewed following labs and imaging studies  CBC:  Recent Labs Lab 01/22/16 1534 01/23/16 0520 01/24/16 0420 01/26/16 0358 01/27/16 0354  WBC 11.8* 8.3 7.2 5.9 5.1  NEUTROABS  --  5.3  --   --   --   HGB 8.3* 7.5* 7.7* 7.7* 7.3*  HCT 26.5* 24.4* 25.3* 25.3* 24.4*    MCV 82.8 81.3 82.1 81.4 81.6  PLT 258 237 245 228 638   Basic Metabolic Panel:  Recent Labs Lab 01/22/16 1534 01/23/16 0520 01/26/16 0358 01/27/16 0354  NA 133* 142 137 140  K 5.1 3.8 4.3 4.2  CL 103 108 104 111  CO2 23 27 27 25   GLUCOSE 656* 167* 129* 112*  BUN 19 20 21* 17  CREATININE 0.67 0.64 0.81 0.81  CALCIUM 8.5* 8.4* 8.7* 8.4*  MG  --  1.9 2.0 1.9  PHOS  --  4.5 4.7*  --    Liver Function Tests:  Recent Labs Lab 01/23/16 0520 01/26/16 0358 01/27/16 0354  AST 24 77* 50*  ALT 24 60* 52  ALKPHOS 43 56 60  BILITOT 0.3 0.4 0.5  PROT 4.9* 5.3* 5.1*  ALBUMIN 1.9* 2.1* 2.0*   CBG:  Recent Labs Lab 01/26/16 1131 01/26/16 1555 01/26/16 1958 01/26/16 2324 01/27/16 0337  GLUCAP 131* 135* 135* 124* 117*    Recent Results (from the past 240 hour(s))  Surgical pcr screen     Status: None   Collection Time: 01/19/16 11:59 AM  Result Value Ref Range Status   MRSA, PCR NEGATIVE NEGATIVE Final   Staphylococcus aureus NEGATIVE NEGATIVE Final    Radiology Studies: No results found.  Scheduled Meds: . acetaminophen  650 mg Oral Q6H  . antiseptic oral rinse  7 mL Mouth Rinse BID  . arformoterol  15 mcg Nebulization BID   And  . umeclidinium bromide  1 puff Inhalation Daily  . azelastine  1 spray Each Nare BID  . clorazepate  15 mg Oral QHS  . ezetimibe  10 mg Oral Daily  . ferrous sulfate  325 mg Oral QPC supper  . heparin subcutaneous  5,000 Units Subcutaneous Q8H  . insulin aspart  0-15 Units Subcutaneous Q4H  . levothyroxine  25 mcg Oral QAC breakfast  . metoprolol  5 mg Intravenous Q6H  . montelukast  10 mg Oral QHS  . multivitamins with iron  1 tablet Oral QPC lunch  . QUEtiapine  50 mg Oral Q breakfast  . rosuvastatin  20 mg Oral QHS  . saccharomyces boulardii  250 mg Oral BID  . sodium chloride flush  10-40 mL Intracatheter Q12H  . sodium chloride flush  3 mL Intravenous Q12H  . tamsulosin  0.4 mg Oral QPC breakfast  . topiramate  25 mg Oral  QHS   Continuous Infusions:     LOS: 16 days   Time spent: 20 minutes.  Faye Ramsay, MD [facility] Pager [phone]  If 7PM-7AM, please contact night-coverage [email or web address] Password [facility] 01/27/2016, 6:57 AM

## 2016-01-28 LAB — GASTROINTESTINAL PANEL BY PCR, STOOL (REPLACES STOOL CULTURE)
ADENOVIRUS F40/41: NOT DETECTED
Astrovirus: NOT DETECTED
CAMPYLOBACTER SPECIES: NOT DETECTED
CRYPTOSPORIDIUM: NOT DETECTED
CYCLOSPORA CAYETANENSIS: NOT DETECTED
E. coli O157: NOT DETECTED
ENTAMOEBA HISTOLYTICA: NOT DETECTED
ENTEROAGGREGATIVE E COLI (EAEC): NOT DETECTED
ENTEROPATHOGENIC E COLI (EPEC): NOT DETECTED
Enterotoxigenic E coli (ETEC): NOT DETECTED
GIARDIA LAMBLIA: NOT DETECTED
Norovirus GI/GII: NOT DETECTED
Plesimonas shigelloides: NOT DETECTED
Rotavirus A: NOT DETECTED
SHIGELLA/ENTEROINVASIVE E COLI (EIEC): NOT DETECTED
Salmonella species: NOT DETECTED
Sapovirus (I, II, IV, and V): NOT DETECTED
Shiga like toxin producing E coli (STEC): NOT DETECTED
VIBRIO CHOLERAE: NOT DETECTED
VIBRIO SPECIES: NOT DETECTED
YERSINIA ENTEROCOLITICA: NOT DETECTED

## 2016-01-28 LAB — GLUCOSE, CAPILLARY
GLUCOSE-CAPILLARY: 123 mg/dL — AB (ref 65–99)
GLUCOSE-CAPILLARY: 143 mg/dL — AB (ref 65–99)
GLUCOSE-CAPILLARY: 114 mg/dL — AB (ref 65–99)
Glucose-Capillary: 114 mg/dL — ABNORMAL HIGH (ref 65–99)
Glucose-Capillary: 138 mg/dL — ABNORMAL HIGH (ref 65–99)

## 2016-01-28 LAB — BASIC METABOLIC PANEL
ANION GAP: 7 (ref 5–15)
BUN: 12 mg/dL (ref 6–20)
CHLORIDE: 108 mmol/L (ref 101–111)
CO2: 25 mmol/L (ref 22–32)
Calcium: 8.9 mg/dL (ref 8.9–10.3)
Creatinine, Ser: 0.81 mg/dL (ref 0.44–1.00)
GFR calc non Af Amer: >60 mL/min (ref >60)
Glucose, Bld: 111 mg/dL — ABNORMAL HIGH (ref 65–99)
POTASSIUM: 4 mmol/L (ref 3.5–5.1)
SODIUM: 140 mmol/L (ref 135–145)

## 2016-01-28 LAB — CBC
HCT: 25.3 % — ABNORMAL LOW (ref 36.0–46.0)
Hemoglobin: 7.7 g/dL — ABNORMAL LOW (ref 12.0–15.0)
MCH: 25.2 pg — AB (ref 26.0–34.0)
MCHC: 30.4 g/dL (ref 30.0–36.0)
MCV: 82.7 fL (ref 78.0–100.0)
PLATELETS: 266 10*3/uL (ref 150–400)
RBC: 3.06 MIL/uL — ABNORMAL LOW (ref 3.87–5.11)
RDW: 16.2 % — ABNORMAL HIGH (ref 11.5–15.5)
WBC: 4.4 10*3/uL (ref 4.0–10.5)

## 2016-01-28 MED ORDER — METOPROLOL TARTRATE 25 MG PO TABS
25.0000 mg | ORAL_TABLET | Freq: Two times a day (BID) | ORAL | Status: DC
  Administered 2016-01-28 – 2016-02-02 (×10): 25 mg via ORAL
  Filled 2016-01-28 (×11): qty 1

## 2016-01-28 MED ORDER — CLOPIDOGREL BISULFATE 75 MG PO TABS
75.0000 mg | ORAL_TABLET | Freq: Every evening | ORAL | Status: DC
  Administered 2016-01-28 – 2016-02-01 (×5): 75 mg via ORAL
  Filled 2016-01-28 (×5): qty 1

## 2016-01-28 NOTE — Progress Notes (Signed)
PROGRESS NOTE  Kanchan Gal [place]  HEN:[id number] DOB: [birth year]  DOA: 01/11/2016   PCP: [name], MD   Brief Narrative:  82 year old female patient with a PMH of asthma, CAD, chronic diastolic CHF, DM, hypothyroid, GERD, HTN, presented to [facility] ED on 01/11/16 with 3 - 4 day history of dyspnea and productive cough, failed outpatient azithromycin, admitted with diagnosis of sepsis secondary to LLL CAP. Completed treatment for pneumonia. Persistent PSBO despite conservative management for several days. Status post diagnostic laparoscopy converted to exploratory laparotomy, lysis of adhesions 01/19/16 and transferred to stepdown unit postoperatively. Ongoing postop ileus with subsequent loose stools.   Assessment & Plan: Sepsis due to Right lower lobe community-acquired pneumonia, unknown pathogen  - Met sepsis criteria on admission. - Started empirically on Rocephin and azithromycin. Initially admitted to stepdown unit. - Blood cultures 2: Negative. Urine culture times one: Negative. Lactate: 1.9 > 1.2. Pro-calcitonin 0.26. - Has completed 8 days of IV antibiotics and antibiotics have been discontinued 5/31 - Sepsis resolved. Pt remains afebrile and with WBC WNL  Partial Small bowel obstruction, S/P diagnostic laparoscopy converted to exploratory laparotomy, lysis of adhesions 01/19/16  - s/p laparotomy and LOA on 01/19/16 - advanced diet, pt tolerating diet well, less diarrhea   Loose stools  - C. Diff negative - Discussed with Dr. Penelope Coop GI specialist - he thinks this is due to recent ABX use - recommended GI stool panel by PCR, this is still pending  - added florastor and imodium as needed  Which seems to be helping  - would not recommend intervention such as colonoscopy given pt's age and recent surgery   Acute kidney injury  - Presented with creatinine of 2.49. - pre renal etiology in the setting of acute illness, sepsis - resolved with IVF   Acute respiratory failure with hypoxia - Secondary  to community-acquired pneumonia. Resolved.  - Continue to provide pulmonary toilet, incentive spirometry and mobilization encouraged  - overall better, encourage PT  - provided education on use of IS while awake to improve lung function   Acute blood loss anemia - Hemoglobin has gradually dropped from 9.3 on 6/2 > 7.5 on 6/5.  - Hg 7.7 this am, no signs of bleeding - transfuse if Hg < 7 - CBC in AM  Type II DM - Good inpatient control. - continue SSI   Chronic diastolic CHF - with mild LE edema and rales at bases - weight is also up from admission from 188 lbs --> 194 lbs --> 188 lbs  - will continue to monitor daily weights, strict I/O   Hypothyroid - Continue Synthroid . Continue to provide meds via IV while until pt able to tolerate PO  Essential hypertension - Controlled on metoprolol, plan to change to PO in next 24 hours   Hyperlipidemia - Statins to be continued    GERD - PPI IV  Anxiety & depression  - Continue home medications  - stable this AM  CAD - plavix has been on hold pre op and still on hold - worried about drop in Hg, Hg has however improved and we can try restarting Plavix  - Cardiology had seen for preop clearance.  Hypokalemia - Resolved.  - BMP in AM  Acute urinary retention - Foley catheter had been removed postop on 6/3 but had to be replaced on 6/4 due to recurrent urinary retention.  - completed three days of Flomax, removed foley 6/7  Asthma - Stable respiratory status, maintaining oxygen saturation at target  range   Chronic LBBB, sinus tachycardia - Asymptomatic sinus tachycardia in the 110s.  - continue Metoprolol as needed   Morbid obesity due to excess calories  - pt meets criteria for morbid obesity with BMI > 35 and underlying risk factors of HTN, HLD, DM - Body mass index is 35.65 kg/(m^2).   DVT prophylaxis: SCDs, subcutaneous heparin Code Status: Full Family Communication: Discussed with patient and family at  bedside. Disposition Plan: DC to SNF when cleared by surgery team. May be able to transfer to tele unit in next 24 hours    Consultants:   General surgery  Cardiology.  Procedures:   NG tube  Foley catheter-DC 6/3. In and out cath when necessary  Right upper arm PICC 01/19/16 >  Status post diagnostic laparoscopy converted to exploratory laparotomy, lysis of adhesions 01/19/16   Antimicrobials:   IV Rocephin 5/24 > 5/31   Azithromycin  5/24 > 5/31    Subjective: Reports several episodes of watery stools yesterday, one this AM.  Objective:  Filed Vitals:   01/28/16 0741 01/28/16 0745 01/28/16 1113 01/28/16 1238  BP: 120/54     Pulse: 105     Temp:  98.5 F (36.9 C) 98.3 F (36.8 C)   TempSrc:  Oral Oral   Resp: 26     Height:      Weight:      SpO2: 95%   97%    Intake/Output Summary (Last 24 hours) at 01/28/16 1429 Last data filed at 01/28/16 1120  Gross per 24 hour  Intake    607 ml  Output   1700 ml  Net  -1093 ml   Filed Weights   01/26/16 0500 01/27/16 0334 01/28/16 0500  Weight: 83.6 kg (184 lb 4.9 oz) 85.14 kg (187 lb 11.2 oz) 85.548 kg (188 lb 9.6 oz)    Examination:  General exam: Small built and frail pleasant elderly female lying comfortably supine in bed. Respiratory system: diminished breath sounds at bases with rales, cough noted but non productive  Cardiovascular system: S1 & S2 heard, tachy. No JVD, murmurs, rubs, gallops or clicks. Gastrointestinal system: mildly distended, laparotomy site clean and dry, epigastric area non tender, good bowel sounds  Central nervous system: Alert and oriented. No focal neurological deficits.  Data Reviewed: I have personally reviewed following labs and imaging studies  CBC:  Recent Labs Lab 01/23/16 0520 01/24/16 0420 01/26/16 0358 01/27/16 0354 01/28/16 0500  WBC 8.3 7.2 5.9 5.1 4.4  NEUTROABS 5.3  --   --   --   --   HGB 7.5* 7.7* 7.7* 7.3* 7.7*  HCT 24.4* 25.3* 25.3* 24.4* 25.3*  MCV 81.3  82.1 81.4 81.6 82.7  PLT 237 245 228 252 938   Basic Metabolic Panel:  Recent Labs Lab 01/22/16 1534 01/23/16 0520 01/26/16 0358 01/27/16 0354 01/28/16 0500  NA 133* 142 137 140 140  K 5.1 3.8 4.3 4.2 4.0  CL 103 108 104 111 108  CO2 _0 GLUCOSE 656* 167* 129* 112* 111*  BUN 19 20 21* 17 12  CREATININE 0.67 0.64 0.81 0.81 0.81  CALCIUM 8.5* 8.4* 8.7* 8.4* 8.9  MG  --  1.9 2.0 1.9  --   PHOS  --  4.5 4.7*  --   --    Liver Function Tests:  Recent Labs Lab 01/23/16 0520 01/26/16 0358 01/27/16 0354  AST 24 77* 50*  ALT 24 60* 52  ALKPHOS 43 56 60  BILITOT  0.3 0.4 0.5  PROT 4.9* 5.3* 5.1*  ALBUMIN 1.9* 2.1* 2.0*   CBG:  Recent Labs Lab 01/27/16 2000 01/27/16 2313 01/28/16 0435 01/28/16 0743 01/28/16 1109  GLUCAP 111* 132* 114* 123* 143*    Recent Results (from the past 240 hour(s))  Surgical pcr screen     Status: None   Collection Time: 01/19/16 11:59 AM  Result Value Ref Range Status   MRSA, PCR NEGATIVE NEGATIVE Final   Staphylococcus aureus NEGATIVE NEGATIVE Final    Radiology Studies: No results found.  Scheduled Meds: . acetaminophen  650 mg Oral Q6H  . antiseptic oral rinse  7 mL Mouth Rinse BID  . arformoterol  15 mcg Nebulization BID   And  . umeclidinium bromide  1 puff Inhalation Daily  . azelastine  1 spray Each Nare BID  . clorazepate  15 mg Oral QHS  . ezetimibe  10 mg Oral Daily  . feeding supplement (GLUCERNA SHAKE)  237 mL Oral TID BM  . ferrous sulfate  325 mg Oral QPC supper  . heparin subcutaneous  5,000 Units Subcutaneous Q8H  . insulin aspart  0-15 Units Subcutaneous Q4H  . levothyroxine  25 mcg Oral QAC breakfast  . metoprolol  5 mg Intravenous Q6H  . montelukast  10 mg Oral QHS  . multivitamins with iron  1 tablet Oral QPC lunch  . QUEtiapine  50 mg Oral Q breakfast  . rosuvastatin  20 mg Oral QHS  . saccharomyces boulardii  250 mg Oral BID  . sodium chloride flush  10-40 mL Intracatheter Q12H  . sodium  chloride flush  3 mL Intravenous Q12H  . tamsulosin  0.4 mg Oral QPC breakfast  . topiramate  25 mg Oral QHS   Continuous Infusions:     LOS: 17 days   Time spent: 20 minutes.  Faye Ramsay, MD [facility] Pager [phone]  If 7PM-7AM, please contact night-coverage [email or web address] Password [email or web address] 01/28/2016, 2:29 PM

## 2016-01-28 NOTE — Progress Notes (Signed)
Patient ID: [name], female   DOB: [birth year], 82 y.o.   MRN: [id number]     [address] [place], [place] [address]    Phone: [phone] FAX: [phone]     Subjective: No n/v. Tolerating a diet. Still with diarrhea.   Objective:  Vital signs:  Filed Vitals:   01/28/16 0436 01/28/16 0500 01/28/16 0515 01/28/16 0745  BP: 95/49  134/58   Pulse: 104  105   Temp: 97.9 F (36.6 C)   98.5 F (36.9 C)  TempSrc: Oral   Oral  Resp: 17  17   Height:      Weight:  85.548 kg (188 lb 9.6 oz)    SpO2: 96%  97%     Last BM Date: 01/27/16  Intake/Output   Yesterday:  06/09 0701 - 06/10 0700 In: 812 [P.O.:477; I.V.:20] Out: 1800 [Urine:1800] This shift:     Physical Exam: General: Pt awake/alert/oriented x4 in no acute distress Abdomen: Soft. Nondistended. Mildly tender at incisions only. Staples in place, no erythema. No evidence of peritonitis. No incarcerated hernias.    Problem List:   Principal Problem:   Sepsis due to pneumonia [facility] Active Problems:   Diabetes mellitus type 2, controlled ([place])   Coronary atherosclerosis   Asthma with bronchitis   HTN (hypertension)   HLD (hyperlipidemia)   Hypothyroid   GERD (gastroesophageal reflux disease)   Diabetes mellitus type 2 without retinopathy ([place])   Sepsis ([place])   Acute on chronic respiratory failure (H[facility])   SBO (small bowel obstruction) ([place])   Hypokalemia   Preop cardiovascular exam    Results:   Labs: Results for orders placed or performed during the hospital encounter of 01/11/16 (from the past 48 hour(s))  Glucose, capillary     Status: Abnormal   Collection Time: 01/26/16 11:31 AM  Result Value Ref Range   Glucose-Capillary 131 (H) 65 - 99 mg/dL   Comment 1 Notify RN    Comment 2 Document in Chart   Glucose, capillary     Status: Abnormal   Collection Time: 01/26/16  3:55 PM  Result Value Ref Range   Glucose-Capillary  135 (H) 65 - 99 mg/dL   Comment 1 Notify RN    Comment 2 Document in Chart   Glucose, capillary     Status: Abnormal   Collection Time: 01/26/16  7:58 PM  Result Value Ref Range   Glucose-Capillary 135 (H) 65 - 99 mg/dL  Glucose, capillary     Status: Abnormal   Collection Time: 01/26/16 11:24 PM  Result Value Ref Range   Glucose-Capillary 124 (H) 65 - 99 mg/dL  Glucose, capillary     Status: Abnormal   Collection Time: 01/27/16  3:37 AM  Result Value Ref Range   Glucose-Capillary 117 (H) 65 - 99 mg/dL  CBC     Status: Abnormal   Collection Time: 01/27/16  3:54 AM  Result Value Ref Range   WBC 5.1 4.0 - 10.5 K/uL   RBC 2.99 (L) 3.87 - 5.11 MIL/uL   Hemoglobin 7.3 (L) 12.0 - 15.0 g/dL   HCT 24.4 (L) 36.0 - 46.0 %   MCV 81.6 78.0 - 100.0 fL   MCH 24.4 (L) 26.0 - 34.0 pg   MCHC 29.9 (L) 30.0 - 36.0 g/dL   RDW 15.8 (H) 11.5 - 15.5 %   Platelets 252 150 - 400 K/uL  Magnesium  Status: None   Collection Time: 01/27/16  3:54 AM  Result Value Ref Range   Magnesium 1.9 1.7 - 2.4 mg/dL  Comprehensive metabolic panel     Status: Abnormal   Collection Time: 01/27/16  3:54 AM  Result Value Ref Range   Sodium 140 135 - 145 mmol/L   Potassium 4.2 3.5 - 5.1 mmol/L   Chloride 111 101 - 111 mmol/L   CO2 25 22 - 32 mmol/L   Glucose, Bld 112 (H) 65 - 99 mg/dL   BUN 17 6 - 20 mg/dL   Creatinine, Ser 0.81 0.44 - 1.00 mg/dL   Calcium 8.4 (L) 8.9 - 10.3 mg/dL   Total Protein 5.1 (L) 6.5 - 8.1 g/dL   Albumin 2.0 (L) 3.5 - 5.0 g/dL   AST 50 (H) 15 - 41 U/L   ALT 52 14 - 54 U/L   Alkaline Phosphatase 60 38 - 126 U/L   Total Bilirubin 0.5 0.3 - 1.2 mg/dL   GFR calc non Af Amer >60 >60 mL/min   GFR calc Af Amer >60 >60 mL/min    Comment: (NOTE) The eGFR has been calculated using the CKD EPI equation. This calculation has not been validated in all clinical situations. eGFR's persistently <60 mL/min signify possible Chronic Kidney Disease.    Anion gap 4 (L) 5 - 15  Glucose, capillary      Status: Abnormal   Collection Time: 01/27/16  8:00 AM  Result Value Ref Range   Glucose-Capillary 132 (H) 65 - 99 mg/dL  Glucose, capillary     Status: Abnormal   Collection Time: 01/27/16 12:27 PM  Result Value Ref Range   Glucose-Capillary 118 (H) 65 - 99 mg/dL  Glucose, capillary     Status: Abnormal   Collection Time: 01/27/16  3:32 PM  Result Value Ref Range   Glucose-Capillary 123 (H) 65 - 99 mg/dL  Glucose, capillary     Status: Abnormal   Collection Time: 01/27/16  8:00 PM  Result Value Ref Range   Glucose-Capillary 111 (H) 65 - 99 mg/dL  Glucose, capillary     Status: Abnormal   Collection Time: 01/27/16 11:13 PM  Result Value Ref Range   Glucose-Capillary 132 (H) 65 - 99 mg/dL  Glucose, capillary     Status: Abnormal   Collection Time: 01/28/16  4:35 AM  Result Value Ref Range   Glucose-Capillary 114 (H) 65 - 99 mg/dL  CBC     Status: Abnormal   Collection Time: 01/28/16  5:00 AM  Result Value Ref Range   WBC 4.4 4.0 - 10.5 K/uL   RBC 3.06 (L) 3.87 - 5.11 MIL/uL   Hemoglobin 7.7 (L) 12.0 - 15.0 g/dL   HCT 25.3 (L) 36.0 - 46.0 %   MCV 82.7 78.0 - 100.0 fL   MCH 25.2 (L) 26.0 - 34.0 pg   MCHC 30.4 30.0 - 36.0 g/dL   RDW 16.2 (H) 11.5 - 15.5 %   Platelets 266 150 - 400 K/uL  Basic metabolic panel     Status: Abnormal   Collection Time: 01/28/16  5:00 AM  Result Value Ref Range   Sodium 140 135 - 145 mmol/L   Potassium 4.0 3.5 - 5.1 mmol/L   Chloride 108 101 - 111 mmol/L   CO2 25 22 - 32 mmol/L   Glucose, Bld 111 (H) 65 - 99 mg/dL   BUN 12 6 - 20 mg/dL   Creatinine, Ser 0.81 0.44 - 1.00 mg/dL   Calcium 8.9  8.9 - 10.3 mg/dL   GFR calc non Af Amer >60 >60 mL/min   GFR calc Af Amer >60 >60 mL/min    Comment: (NOTE) The eGFR has been calculated using the CKD EPI equation. This calculation has not been validated in all clinical situations. eGFR's persistently <60 mL/min signify possible Chronic Kidney Disease.    Anion gap 7 5 - 15  Glucose, capillary      Status: Abnormal   Collection Time: 01/28/16  7:43 AM  Result Value Ref Range   Glucose-Capillary 123 (H) 65 - 99 mg/dL   Comment 1 Notify RN     Imaging / Studies: No results found.  Medications / Allergies:  Scheduled Meds: . acetaminophen  650 mg Oral Q6H  . antiseptic oral rinse  7 mL Mouth Rinse BID  . arformoterol  15 mcg Nebulization BID   And  . umeclidinium bromide  1 puff Inhalation Daily  . azelastine  1 spray Each Nare BID  . clorazepate  15 mg Oral QHS  . ezetimibe  10 mg Oral Daily  . feeding supplement (GLUCERNA SHAKE)  237 mL Oral TID BM  . ferrous sulfate  325 mg Oral QPC supper  . heparin subcutaneous  5,000 Units Subcutaneous Q8H  . insulin aspart  0-15 Units Subcutaneous Q4H  . levothyroxine  25 mcg Oral QAC breakfast  . metoprolol  5 mg Intravenous Q6H  . montelukast  10 mg Oral QHS  . multivitamins with iron  1 tablet Oral QPC lunch  . QUEtiapine  50 mg Oral Q breakfast  . rosuvastatin  20 mg Oral QHS  . saccharomyces boulardii  250 mg Oral BID  . sodium chloride flush  10-40 mL Intracatheter Q12H  . sodium chloride flush  3 mL Intravenous Q12H  . tamsulosin  0.4 mg Oral QPC breakfast  . topiramate  25 mg Oral QHS   Continuous Infusions:  PRN Meds:.albuterol, ipratropium-albuterol, loperamide, LORazepam, morphine injection, nitroGLYCERIN, ondansetron **OR** ondansetron (ZOFRAN) IV, oxyCODONE, phenol, simethicone, sodium chloride flush  Antibiotics: Anti-infectives    Start     Dose/Rate Route Frequency Ordered Stop   01/19/16 1313  cefoTEtan in Dextrose 5% (CEFOTAN) 2-2.08 GM-% IVPB    Comments:  Sammuel Cooper   : cabinet override      01/19/16 1313 01/20/16 0129   01/19/16 1230  cefoTEtan (CEFOTAN) 2 g in dextrose 5 % 50 mL IVPB     2 g 100 mL/hr over 30 Minutes Intravenous To Surgery 01/19/16 1223 01/19/16 1422   01/12/16 0900  cefTRIAXone (ROCEPHIN) 1 g in dextrose 5 % 50 mL IVPB     1 g 100 mL/hr over 30 Minutes Intravenous Every 24 hours  01/11/16 1251 01/18/16 1018   01/12/16 0900  azithromycin (ZITHROMAX) 500 mg in dextrose 5 % 250 mL IVPB     500 mg 250 mL/hr over 60 Minutes Intravenous Every 24 hours 01/11/16 1251 01/18/16 1048   01/11/16 0830  azithromycin (ZITHROMAX) 500 mg in dextrose 5 % 250 mL IVPB     500 mg 250 mL/hr over 60 Minutes Intravenous  Once 01/11/16 0820 01/11/16 0935   01/11/16 0830  cefTRIAXone (ROCEPHIN) 1 g in dextrose 5 % 50 mL IVPB     1 g 100 mL/hr over 30 Minutes Intravenous  Once 01/11/16 0820 01/11/16 0947        Assessment/Plan PSBO POD#9 diagnostic laparoscopy, laparotomy, LOA---Dr. Redmond Pulling 01/19/16 -tolerating POs, up to chair, PT, pain controlled. Only complaint is diarrhea, suspect post  obstructive, watch, should slow down.  -plan to remove staples prior to DC  Urinary retention-voiding trial on monday, flomax ID-treated for PNA/sepsis. Diarrhea, c diff negative 6/7.  FEN-appetite is fair, glucerna shakes  DVT prophylaxis-scd/heparin ABLA-stable today     Erby Pian, ANP-BC [facility]   01/28/2016 9:28 AM

## 2016-01-28 NOTE — Progress Notes (Signed)
Occupational Therapy Treatment Patient Details Name: [name] MRN: [id number] DOB: 1924-11-23 Today's Date: 01/28/2016    History of present illness 82 y.o. female adm with 3-4d of SOB, also with abd distension, found to have SBO d/y adhesion;    PMHx: Asthma not on home oxygen, CAD, DM2, hypothyroidism, GERD, CAD, OA, R TKA.  Pt s/p exploratory laparotomy due to partial SBO on 01/19/16.     OT comments  Pt progressing slowly toward goals.  She was incontinent a small amount of stool.  Assisted her with peri care in standing - stood for 9 mins.  Pivoted to chair with min A   Follow Up Recommendations  SNF;Supervision/Assistance - 24 hour    Equipment Recommendations  Other (comment)    Recommendations for Other Services      Precautions / Restrictions Precautions Precautions: Fall Precaution Comments: due to weakness       Mobility Bed Mobility Overal bed mobility: Needs Assistance Bed Mobility: Supine to Sit Rolling: Mod assist Sidelying to sit: Mod assist       General bed mobility comments: Pt requires assist with all aspects   Transfers Overall transfer level: Needs assistance Equipment used: Rolling walker (2 wheeled) Transfers: Sit to/from [facility] Sit to Stand: Min assist Stand pivot transfers: Min assist       General transfer comment: Pt moves slowly.  Min A to move into standing and min A to pivot for balance     Balance Overall balance assessment: Needs assistance Sitting-balance support: Feet supported Sitting balance-Leahy Scale: Fair     Standing balance support: Bilateral upper extremity supported Standing balance-Leahy Scale: Fair Standing balance comment: stood x 9 mins with min gaurd - min A while performing peri care                    ADL Overall ADL's : Needs assistance/impaired                         Toilet Transfer: Minimal assistance;Stand-pivot;BSC;RW   Toileting- Clothing Manipulation and  Hygiene: Total assistance;Sit to/from stand Toileting - Clothing Manipulation Details (indicate cue type and reason): Pt incontinent of stool.  Assisted with peri care in standing.       Functional mobility during ADLs: Minimal assistance;Rolling walker General ADL Comments: Pt moves very slowly and requires encouragement       Vision                     Perception     Praxis      Cognition   Behavior During Therapy: WFL for tasks assessed/performed Overall Cognitive Status: Within Functional Limits for tasks assessed                       Extremity/Trunk Assessment               Exercises     Shoulder Instructions       General Comments      Pertinent Vitals/ Pain       Pain Assessment: Faces Faces Pain Scale: Hurts a little bit Pain Location: abdomen Pain Descriptors / Indicators: Operative site guarding Pain Intervention(s): Monitored during session  Home Living  Prior Functioning/Environment              Frequency Min 2X/week     Progress Toward Goals  OT Goals(current goals can now be found in the care plan section)  Progress towards OT goals: Progressing toward goals  Acute Rehab OT Goals Patient Stated Goal: return to being independent OT Goal Formulation: With patient Time For Goal Achievement: 02/06/16 ADL Goals Pt Will Perform Lower Body Bathing: with min guard assist;sit to/from stand;with adaptive equipment Pt Will Perform Lower Body Dressing: with min guard assist;sit to/from stand;with adaptive equipment Pt Will Transfer to Toilet: with supervision;bedside commode;ambulating Pt/caregiver will Perform Home Exercise Program: Increased strength;Both right and left upper extremity;With theraband;With Supervision Additional ADL Goal #1: Pt will verbalize use of 3 energy conservation techniques for ADL  Plan Discharge plan remains appropriate     Co-evaluation                 End of Session     Activity Tolerance Patient tolerated treatment well   Patient Left in chair;with call bell/phone within reach;with chair alarm set;with nursing/sitter in room   Nurse Communication Mobility status        Time: [id number] OT Time Calculation (min): 44 min  Charges: OT General Charges $OT Visit: 1 Procedure OT Treatments $Self Care/Home Management : 38-52 mins  Rylie Knierim M 01/28/2016, 10:17 AM

## 2016-01-29 DIAGNOSIS — R197 Diarrhea, unspecified: Secondary | ICD-10-CM

## 2016-01-29 LAB — CBC
HCT: 26 % — ABNORMAL LOW (ref 36.0–46.0)
Hemoglobin: 7.7 g/dL — ABNORMAL LOW (ref 12.0–15.0)
MCH: 24.4 pg — AB (ref 26.0–34.0)
MCHC: 29.6 g/dL — AB (ref 30.0–36.0)
MCV: 82.3 fL (ref 78.0–100.0)
PLATELETS: 254 10*3/uL (ref 150–400)
RBC: 3.16 MIL/uL — ABNORMAL LOW (ref 3.87–5.11)
RDW: 16 % — AB (ref 11.5–15.5)
WBC: 4.4 10*3/uL (ref 4.0–10.5)

## 2016-01-29 LAB — BASIC METABOLIC PANEL
ANION GAP: 6 (ref 5–15)
BUN: 12 mg/dL (ref 6–20)
CALCIUM: 8.8 mg/dL — AB (ref 8.9–10.3)
CO2: 25 mmol/L (ref 22–32)
CREATININE: 0.77 mg/dL (ref 0.44–1.00)
Chloride: 109 mmol/L (ref 101–111)
GFR calc Af Amer: >60 mL/min (ref >60)
GLUCOSE: 122 mg/dL — AB (ref 65–99)
Potassium: 4.1 mmol/L (ref 3.5–5.1)
SODIUM: 140 mmol/L (ref 135–145)

## 2016-01-29 LAB — GLUCOSE, CAPILLARY
GLUCOSE-CAPILLARY: 129 mg/dL — AB (ref 65–99)
GLUCOSE-CAPILLARY: 159 mg/dL — AB (ref 65–99)
Glucose-Capillary: 112 mg/dL — ABNORMAL HIGH (ref 65–99)
Glucose-Capillary: 131 mg/dL — ABNORMAL HIGH (ref 65–99)
Glucose-Capillary: 138 mg/dL — ABNORMAL HIGH (ref 65–99)
Glucose-Capillary: 139 mg/dL — ABNORMAL HIGH (ref 65–99)

## 2016-01-29 MED ORDER — PROMETHAZINE HCL 25 MG PO TABS
25.0000 mg | ORAL_TABLET | Freq: Four times a day (QID) | ORAL | Status: DC | PRN
  Administered 2016-01-29 – 2016-01-30 (×3): 25 mg via ORAL
  Filled 2016-01-29 (×5): qty 1

## 2016-01-29 NOTE — Progress Notes (Signed)
Patient c/o nausea all night with little relief from Zofran. MD notified phenergan PO ordered to try.

## 2016-01-29 NOTE — Progress Notes (Signed)
Patient ID: [name], female   DOB: 08/26/1924, 82 y.o.   MRN: [id number]     [address] [place], [place] [address]    Phone: [phone] FAX: [phone]     Subjective: Nausea overnight. Diarrhea. Stool panel negative. Afebrile.  VSS.  WBC normal. Sat up in a chair for 2 hours.   Objective:  Vital signs:  Filed Vitals:   01/28/16 1922 01/28/16 2013 01/29/16 0030 01/29/16 0357  BP:  129/44 123/49 134/41  Pulse:  107 107 106  Temp:  98.7 F (37.1 C) 98.1 F (36.7 C) 98.2 F (36.8 C)  TempSrc:  Oral Oral Oral  Resp:  _0 Height:      Weight:    84.868 kg (187 lb 1.6 oz)  SpO2: 95% 97% 99% 98%    Last BM Date: 01/27/16  Intake/Output   Yesterday:  06/10 0701 - 06/11 0700 In: 780 [P.O.:780] Out: 1200 [Urine:1200] This shift:    Physical Exam: General: Pt awake/alert/oriented x4 in no acute distress Abdomen: Soft. Nondistended. Mildly tender at incisions only. Staples in place, no erythema. No evidence of peritonitis. No incarcerated hernias.    Problem List:   Principal Problem:   Sepsis due to pneumonia [facility]) Active Problems:   Diabetes mellitus type 2, controlled ([place])   Coronary atherosclerosis   Asthma with bronchitis   HTN (hypertension)   HLD (hyperlipidemia)   Hypothyroid   GERD (gastroesophageal reflux disease)   Diabetes mellitus type 2 without retinopathy (HCC)   Sepsis ([place])   Acute on chronic respiratory failure (HCC)   SBO (small bowel obstruction) ([place])   Hypokalemia   Preop cardiovascular exam    Results:   Labs: Results for orders placed or performed during the hospital encounter of 01/11/16 (from the past 48 hour(s))  Glucose, capillary     Status: Abnormal   Collection Time: 01/27/16 12:27 PM  Result Value Ref Range   Glucose-Capillary 118 (H) 65 - 99 mg/dL  Gastrointestinal Panel by PCR , Stool     Status: None   Collection Time: 01/27/16  1:38  PM  Result Value Ref Range   Campylobacter species NOT DETECTED NOT DETECTED   Plesimonas shigelloides NOT DETECTED NOT DETECTED   Salmonella species NOT DETECTED NOT DETECTED   Yersinia enterocolitica NOT DETECTED NOT DETECTED   Vibrio species NOT DETECTED NOT DETECTED   Vibrio cholerae NOT DETECTED NOT DETECTED   Enteroaggregative E coli (EAEC) NOT DETECTED NOT DETECTED   Enteropathogenic E coli (EPEC) NOT DETECTED NOT DETECTED   Enterotoxigenic E coli (ETEC) NOT DETECTED NOT DETECTED   Shiga like toxin producing E coli (STEC) NOT DETECTED NOT DETECTED   E. coli O157 NOT DETECTED NOT DETECTED   Shigella/Enteroinvasive E coli (EIEC) NOT DETECTED NOT DETECTED   Cryptosporidium NOT DETECTED NOT DETECTED   Cyclospora cayetanensis NOT DETECTED NOT DETECTED   Entamoeba histolytica NOT DETECTED NOT DETECTED   Giardia lamblia NOT DETECTED NOT DETECTED   Adenovirus F40/41 NOT DETECTED NOT DETECTED   Astrovirus NOT DETECTED NOT DETECTED   Norovirus GI/GII NOT DETECTED NOT DETECTED   Rotavirus A NOT DETECTED NOT DETECTED   Sapovirus (I, II, IV, and V) NOT DETECTED NOT DETECTED  Glucose, capillary     Status: Abnormal   Collection Time: 01/27/16  3:32 PM  Result Value Ref Range   Glucose-Capillary 123 (H) 65 - 99 mg/dL  Glucose, capillary  Status: Abnormal   Collection Time: 01/27/16  8:00 PM  Result Value Ref Range   Glucose-Capillary 111 (H) 65 - 99 mg/dL  Glucose, capillary     Status: Abnormal   Collection Time: 01/27/16 11:13 PM  Result Value Ref Range   Glucose-Capillary 132 (H) 65 - 99 mg/dL  Glucose, capillary     Status: Abnormal   Collection Time: 01/28/16  4:35 AM  Result Value Ref Range   Glucose-Capillary 114 (H) 65 - 99 mg/dL  CBC     Status: Abnormal   Collection Time: 01/28/16  5:00 AM  Result Value Ref Range   WBC 4.4 4.0 - 10.5 K/uL   RBC 3.06 (L) 3.87 - 5.11 MIL/uL   Hemoglobin 7.7 (L) 12.0 - 15.0 g/dL   HCT 25.3 (L) 36.0 - 46.0 %   MCV 82.7 78.0 - 100.0 fL    MCH 25.2 (L) 26.0 - 34.0 pg   MCHC 30.4 30.0 - 36.0 g/dL   RDW 16.2 (H) 11.5 - 15.5 %   Platelets 266 150 - 400 K/uL  Basic metabolic panel     Status: Abnormal   Collection Time: 01/28/16  5:00 AM  Result Value Ref Range   Sodium 140 135 - 145 mmol/L   Potassium 4.0 3.5 - 5.1 mmol/L   Chloride 108 101 - 111 mmol/L   CO2 25 22 - 32 mmol/L   Glucose, Bld 111 (H) 65 - 99 mg/dL   BUN 12 6 - 20 mg/dL   Creatinine, Ser 0.81 0.44 - 1.00 mg/dL   Calcium 8.9 8.9 - 10.3 mg/dL   GFR calc non Af Amer >60 >60 mL/min   GFR calc Af Amer >60 >60 mL/min    Comment: (NOTE) The eGFR has been calculated using the CKD EPI equation. This calculation has not been validated in all clinical situations. eGFR's persistently <60 mL/min signify possible Chronic Kidney Disease.    Anion gap 7 5 - 15  Glucose, capillary     Status: Abnormal   Collection Time: 01/28/16  7:43 AM  Result Value Ref Range   Glucose-Capillary 123 (H) 65 - 99 mg/dL   Comment 1 Notify RN   Glucose, capillary     Status: Abnormal   Collection Time: 01/28/16 11:09 AM  Result Value Ref Range   Glucose-Capillary 143 (H) 65 - 99 mg/dL   Comment 1 Notify RN   Glucose, capillary     Status: Abnormal   Collection Time: 01/28/16  3:15 PM  Result Value Ref Range   Glucose-Capillary 114 (H) 65 - 99 mg/dL   Comment 1 Notify RN   Glucose, capillary     Status: Abnormal   Collection Time: 01/28/16  8:12 PM  Result Value Ref Range   Glucose-Capillary 138 (H) 65 - 99 mg/dL  Glucose, capillary     Status: Abnormal   Collection Time: 01/29/16 12:26 AM  Result Value Ref Range   Glucose-Capillary 138 (H) 65 - 99 mg/dL   Comment 1 Notify RN   Glucose, capillary     Status: Abnormal   Collection Time: 01/29/16  3:56 AM  Result Value Ref Range   Glucose-Capillary 129 (H) 65 - 99 mg/dL   Comment 1 Notify RN   CBC     Status: Abnormal   Collection Time: 01/29/16  6:07 AM  Result Value Ref Range   WBC 4.4 4.0 - 10.5 K/uL   RBC 3.16 (L)  3.87 - 5.11 MIL/uL   Hemoglobin 7.7 (L) 12.0 -  15.0 g/dL   HCT 26.0 (L) 36.0 - 46.0 %   MCV 82.3 78.0 - 100.0 fL   MCH 24.4 (L) 26.0 - 34.0 pg   MCHC 29.6 (L) 30.0 - 36.0 g/dL   RDW 16.0 (H) 11.5 - 15.5 %   Platelets 254 150 - 400 K/uL  Basic metabolic panel     Status: Abnormal   Collection Time: 01/29/16  6:07 AM  Result Value Ref Range   Sodium 140 135 - 145 mmol/L   Potassium 4.1 3.5 - 5.1 mmol/L   Chloride 109 101 - 111 mmol/L   CO2 25 22 - 32 mmol/L   Glucose, Bld 122 (H) 65 - 99 mg/dL   BUN 12 6 - 20 mg/dL   Creatinine, Ser 0.77 0.44 - 1.00 mg/dL   Calcium 8.8 (L) 8.9 - 10.3 mg/dL   GFR calc non Af Amer >60 >60 mL/min   GFR calc Af Amer >60 >60 mL/min    Comment: (NOTE) The eGFR has been calculated using the CKD EPI equation. This calculation has not been validated in all clinical situations. eGFR's persistently <60 mL/min signify possible Chronic Kidney Disease.    Anion gap 6 5 - 15    Imaging / Studies: No results found.  Medications / Allergies:  Scheduled Meds: . acetaminophen  650 mg Oral Q6H  . antiseptic oral rinse  7 mL Mouth Rinse BID  . arformoterol  15 mcg Nebulization BID   And  . umeclidinium bromide  1 puff Inhalation Daily  . azelastine  1 spray Each Nare BID  . clopidogrel  75 mg Oral QPM  . clorazepate  15 mg Oral QHS  . ezetimibe  10 mg Oral Daily  . feeding supplement (GLUCERNA SHAKE)  237 mL Oral TID BM  . ferrous sulfate  325 mg Oral QPC supper  . heparin subcutaneous  5,000 Units Subcutaneous Q8H  . insulin aspart  0-15 Units Subcutaneous Q4H  . levothyroxine  25 mcg Oral QAC breakfast  . metoprolol tartrate  25 mg Oral BID  . montelukast  10 mg Oral QHS  . multivitamins with iron  1 tablet Oral QPC lunch  . QUEtiapine  50 mg Oral Q breakfast  . rosuvastatin  20 mg Oral QHS  . saccharomyces boulardii  250 mg Oral BID  . sodium chloride flush  10-40 mL Intracatheter Q12H  . sodium chloride flush  3 mL Intravenous Q12H  . tamsulosin   0.4 mg Oral QPC breakfast  . topiramate  25 mg Oral QHS   Continuous Infusions:  PRN Meds:.albuterol, ipratropium-albuterol, loperamide, LORazepam, morphine injection, nitroGLYCERIN, ondansetron **OR** ondansetron (ZOFRAN) IV, oxyCODONE, phenol, promethazine, simethicone, sodium chloride flush  Antibiotics: Anti-infectives    Start     Dose/Rate Route Frequency Ordered Stop   01/19/16 1313  cefoTEtan in Dextrose 5% (CEFOTAN) 2-2.08 GM-% IVPB    Comments:  Sammuel Cooper   : cabinet override      01/19/16 1313 01/20/16 0129   01/19/16 1230  cefoTEtan (CEFOTAN) 2 g in dextrose 5 % 50 mL IVPB     2 g 100 mL/hr over 30 Minutes Intravenous To Surgery 01/19/16 1223 01/19/16 1422   01/12/16 0900  cefTRIAXone (ROCEPHIN) 1 g in dextrose 5 % 50 mL IVPB     1 g 100 mL/hr over 30 Minutes Intravenous Every 24 hours 01/11/16 1251 01/18/16 1018   01/12/16 0900  azithromycin (ZITHROMAX) 500 mg in dextrose 5 % 250 mL IVPB     500  mg 250 mL/hr over 60 Minutes Intravenous Every 24 hours 01/11/16 1251 01/18/16 1048   01/11/16 0830  azithromycin (ZITHROMAX) 500 mg in dextrose 5 % 250 mL IVPB     500 mg 250 mL/hr over 60 Minutes Intravenous  Once 01/11/16 0820 01/11/16 0935   01/11/16 0830  cefTRIAXone (ROCEPHIN) 1 g in dextrose 5 % 50 mL IVPB     1 g 100 mL/hr over 30 Minutes Intravenous  Once 01/11/16 0820 01/11/16 0947         Assessment/Plan PSBO POD#10 diagnostic laparoscopy, laparotomy, LOA---Dr. Redmond Pulling 01/19/16 -tolerating POs, up to chair, PT, pain controlled. diarrhea persists but overall better, nausea without vomiting -plan to remove staples prior to DC  Urinary retention-voiding trial on monday, flomax ID-treated for PNA/sepsis. Diarrhea, c diff negative 6/7.  FEN-appetite is fair, glucerna shakes.  Antiemetics prn DVT prophylaxis-scd/heparin ABLA-stable  Dispo-SNF  Erby Pian, ANP-BC [facility] [employer] [facility]   01/29/2016 8:25 AM

## 2016-01-29 NOTE — Progress Notes (Signed)
PROGRESS NOTE  [name]  LID:[id number] DOB: [birth year]  DOA: 01/11/2016   PCP: [name], MD   Brief Narrative:  82 year old female patient with a PMH of asthma, CAD, chronic diastolic CHF, DM, hypothyroid, GERD, HTN, presented to [facility] ED on 01/11/16 with 3 - 4 day history of dyspnea and productive cough, failed outpatient azithromycin, admitted with diagnosis of sepsis secondary to LLL CAP. Completed treatment for pneumonia. Persistent PSBO despite conservative management for several days. Status post diagnostic laparoscopy converted to exploratory laparotomy, lysis of adhesions 01/19/16 and transferred to stepdown unit postoperatively. Ongoing postop ileus with subsequent loose stools.   Assessment & Plan: Sepsis due to Right lower lobe community-acquired pneumonia, unknown pathogen  - Met sepsis criteria on admission. - Started empirically on Rocephin and azithromycin. Initially admitted to stepdown unit. - Blood cultures 2: Negative. Urine culture times one: Negative. Lactate: 1.9 > 1.2. Pro-calcitonin 0.26. - Has completed 8 days of IV antibiotics and antibiotics have been discontinued 5/31 - Sepsis resolved. Pt remains afebrile and with WBC WNL  Partial Small bowel obstruction, S/P diagnostic laparoscopy converted to exploratory laparotomy, lysis of adhesions 01/19/16  - s/p laparotomy and LOA on 01/19/16 - advanced diet, pt tolerating diet well,  Loose stools  - C. Diff negative, stool panel negative, d/c contact precautions  - Discussed with Dr. Penelope Coop GI specialist - he thinks this is due to recent ABX use - continue florastor and imodium as needed  Which seems to be helping  - would not recommend intervention such as colonoscopy given pt's age and recent surgery   Acute kidney injury  - Presented with creatinine of 2.49. - resolved with IVF   Acute respiratory failure with hypoxia - Secondary to community-acquired pneumonia. Resolved.  - Continue to provide pulmonary toilet,  incentive spirometry and mobilization encouraged  - overall better, encouraged PT  - provided education on use of IS while awake to improve lung function  - OOB to chair as well   Acute blood loss anemia - Hemoglobin has gradually dropped from 9.3 on 6/2 > 7.5 on 6/5.  - Hg 7.7 this am, no signs of bleeding, stable Hg in the past 24 hours  - transfuse if Hg < 7 - CBC in AM  Type II DM - Good inpatient control. - continue SSI   Chronic diastolic CHF - weight is also up from admission from 188 lbs --> 194 lbs --> 188 lbs --> 187 lbs  - will continue to monitor daily weights, strict I/O   Hypothyroid - Continue Synthroid . Continue to provide meds via IV while until pt able to tolerate PO  Essential hypertension - reasonable inpatient control   Hyperlipidemia - Statins to be continued    GERD - PPI IV  Anxiety & depression  - Continue home medications  - stable this AM  CAD - plavix restarted 6/10  - Cardiology had seen for preop clearance.  Hypokalemia - Resolved.  - BMP in AM  Acute urinary retention - Foley catheter had been removed postop on 6/3 but had to be replaced on 6/4 due to recurrent urinary retention.  - completed three days of Flomax, removed foley 6/7  Asthma - Stable respiratory status, maintaining oxygen saturation at target range   Chronic LBBB, sinus tachycardia - Asymptomatic sinus tachycardia in the 110s.  - continue Metoprolol as needed   Morbid obesity due to excess calories  - pt meets criteria for morbid obesity with BMI > 35 and underlying risk  factors of HTN, HLD, DM - Body mass index is 35.37 kg/(m^2).   DVT prophylaxis: SCDs, subcutaneous heparin Code Status: Full Family Communication: Discussed with patient and son at bedside. Disposition Plan: DC to SNF when cleared by surgery team. Transfer to Telemetry today, 3 E preferred    Consultants:   General surgery  Cardiology.  Procedures:   NG tube  Foley catheter-DC  6/3. In and out cath when necessary  Right upper arm PICC 01/19/16 >  Status post diagnostic laparoscopy converted to exploratory laparotomy, lysis of adhesions 01/19/16   Antimicrobials:   IV Rocephin 5/24 > 5/31   Azithromycin  5/24 > 5/31    Subjective: Reports feeling better this AM.   Objective:  Filed Vitals:   01/28/16 1922 01/28/16 2013 01/29/16 0030 01/29/16 0357  BP:  129/44 123/49 134/41  Pulse:  107 107 106  Temp:  98.7 F (37.1 C) 98.1 F (36.7 C) 98.2 F (36.8 C)  TempSrc:  Oral Oral Oral  Resp:  16 20 15   Height:      Weight:    84.868 kg (187 lb 1.6 oz)  SpO2: 95% 97% 99% 98%    Intake/Output Summary (Last 24 hours) at 01/29/16 0800 Last data filed at 01/29/16 0359  Gross per 24 hour  Intake    780 ml  Output    900 ml  Net   -120 ml   Filed Weights   01/27/16 0334 01/28/16 0500 01/29/16 0357  Weight: 85.14 kg (187 lb 11.2 oz) 85.548 kg (188 lb 9.6 oz) 84.868 kg (187 lb 1.6 oz)    Examination:  General exam: Small built and frail pleasant elderly female lying comfortably supine in bed. Respiratory system: diminished breath sounds at bases with mild rales Cardiovascular system: S1 & S2 heard, tachy. No JVD, murmurs, rubs, gallops or clicks. Gastrointestinal system:  laparotomy site clean and dry, epigastric area non tender, good bowel sounds  Central nervous system: Alert and oriented. No focal neurological deficits.  Data Reviewed: I have personally reviewed following labs and imaging studies  CBC:  Recent Labs Lab 01/23/16 0520 01/24/16 0420 01/26/16 0358 01/27/16 0354 01/28/16 0500 01/29/16 0607  WBC 8.3 7.2 5.9 5.1 4.4 4.4  NEUTROABS 5.3  --   --   --   --   --   HGB 7.5* 7.7* 7.7* 7.3* 7.7* 7.7*  HCT 24.4* 25.3* 25.3* 24.4* 25.3* 26.0*  MCV 81.3 82.1 81.4 81.6 82.7 82.3  PLT 237 245 228 252 266 579   Basic Metabolic Panel:  Recent Labs Lab 01/23/16 0520 01/26/16 0358 01/27/16 0354 01/28/16 0500 01/29/16 0607  NA 142 137  140 140 140  K 3.8 4.3 4.2 4.0 4.1  CL 108 104 111 108 109  CO2 27 27 25 25 25   GLUCOSE 167* 129* 112* 111* 122*  BUN 20 21* 17 12 12   CREATININE 0.64 0.81 0.81 0.81 0.77  CALCIUM 8.4* 8.7* 8.4* 8.9 8.8*  MG 1.9 2.0 1.9  --   --   PHOS 4.5 4.7*  --   --   --    Liver Function Tests:  Recent Labs Lab 01/23/16 0520 01/26/16 0358 01/27/16 0354  AST 24 77* 50*  ALT 24 60* 52  ALKPHOS 43 56 60  BILITOT 0.3 0.4 0.5  PROT 4.9* 5.3* 5.1*  ALBUMIN 1.9* 2.1* 2.0*   CBG:  Recent Labs Lab 01/28/16 1109 01/28/16 1515 01/28/16 2012 01/29/16 0026 01/29/16 0356  GLUCAP 143* 114* 138* 138* 129*  Recent Results (from the past 240 hour(s))  Surgical pcr screen     Status: None   Collection Time: 01/19/16 11:59 AM  Result Value Ref Range Status   MRSA, PCR NEGATIVE NEGATIVE Final   Staphylococcus aureus NEGATIVE NEGATIVE Final    Radiology Studies: No results found.  Scheduled Meds: . acetaminophen  650 mg Oral Q6H  . antiseptic oral rinse  7 mL Mouth Rinse BID  . arformoterol  15 mcg Nebulization BID   And  . umeclidinium bromide  1 puff Inhalation Daily  . azelastine  1 spray Each Nare BID  . clopidogrel  75 mg Oral QPM  . clorazepate  15 mg Oral QHS  . ezetimibe  10 mg Oral Daily  . feeding supplement (GLUCERNA SHAKE)  237 mL Oral TID BM  . ferrous sulfate  325 mg Oral QPC supper  . heparin subcutaneous  5,000 Units Subcutaneous Q8H  . insulin aspart  0-15 Units Subcutaneous Q4H  . levothyroxine  25 mcg Oral QAC breakfast  . metoprolol tartrate  25 mg Oral BID  . montelukast  10 mg Oral QHS  . multivitamins with iron  1 tablet Oral QPC lunch  . QUEtiapine  50 mg Oral Q breakfast  . rosuvastatin  20 mg Oral QHS  . saccharomyces boulardii  250 mg Oral BID  . sodium chloride flush  10-40 mL Intracatheter Q12H  . sodium chloride flush  3 mL Intravenous Q12H  . tamsulosin  0.4 mg Oral QPC breakfast  . topiramate  25 mg Oral QHS   Continuous Infusions:     LOS:  18 days   Time spent: 20 minutes.  Faye Ramsay, MD [facility] Pager [phone]  If 7PM-7AM, please contact night-coverage [email or web address] Password [email or web address] 01/29/2016, 8:00 AM

## 2016-01-29 NOTE — Progress Notes (Signed)
Called son to notify on transfer. Son did not answer, message was left to inform of transfer as well as new location [id number].

## 2016-01-30 DIAGNOSIS — E119 Type 2 diabetes mellitus without complications: Secondary | ICD-10-CM

## 2016-01-30 LAB — CBC
HCT: 28.1 % — ABNORMAL LOW (ref 36.0–46.0)
Hemoglobin: 8.3 g/dL — ABNORMAL LOW (ref 12.0–15.0)
MCH: 24.9 pg — AB (ref 26.0–34.0)
MCHC: 29.5 g/dL — ABNORMAL LOW (ref 30.0–36.0)
MCV: 84.4 fL (ref 78.0–100.0)
PLATELETS: 297 10*3/uL (ref 150–400)
RBC: 3.33 MIL/uL — ABNORMAL LOW (ref 3.87–5.11)
RDW: 16.7 % — AB (ref 11.5–15.5)
WBC: 4.7 10*3/uL (ref 4.0–10.5)

## 2016-01-30 LAB — BASIC METABOLIC PANEL
Anion gap: 7 (ref 5–15)
BUN: 10 mg/dL (ref 6–20)
CALCIUM: 9 mg/dL (ref 8.9–10.3)
CO2: 23 mmol/L (ref 22–32)
CREATININE: 0.69 mg/dL (ref 0.44–1.00)
Chloride: 112 mmol/L — ABNORMAL HIGH (ref 101–111)
GFR calc Af Amer: >60 mL/min (ref >60)
Glucose, Bld: 125 mg/dL — ABNORMAL HIGH (ref 65–99)
Potassium: 4 mmol/L (ref 3.5–5.1)
SODIUM: 142 mmol/L (ref 135–145)

## 2016-01-30 LAB — GLUCOSE, CAPILLARY
GLUCOSE-CAPILLARY: 150 mg/dL — AB (ref 65–99)
Glucose-Capillary: 120 mg/dL — ABNORMAL HIGH (ref 65–99)
Glucose-Capillary: 129 mg/dL — ABNORMAL HIGH (ref 65–99)
Glucose-Capillary: 134 mg/dL — ABNORMAL HIGH (ref 65–99)
Glucose-Capillary: 120 mg/dL — ABNORMAL HIGH (ref 65–99)
Glucose-Capillary: 137 mg/dL — ABNORMAL HIGH (ref 65–99)

## 2016-01-30 LAB — DIFFERENTIAL
BASOS PCT: 2 %
Basophils Absolute: 0.1 10*3/uL (ref 0.0–0.1)
EOS ABS: 0.4 10*3/uL (ref 0.0–0.7)
EOS PCT: 9 %
Lymphocytes Relative: 25 %
Lymphs Abs: 1.1 10*3/uL (ref 0.7–4.0)
MONO ABS: 0.6 10*3/uL (ref 0.1–1.0)
Monocytes Relative: 14 %
NEUTROS PCT: 52 %
Neutro Abs: 2.4 10*3/uL (ref 1.7–7.7)

## 2016-01-30 MED ORDER — INSULIN ASPART 100 UNIT/ML ~~LOC~~ SOLN
0.0000 [IU] | Freq: Three times a day (TID) | SUBCUTANEOUS | Status: DC
  Administered 2016-01-30 – 2016-02-02 (×7): 2 [IU] via SUBCUTANEOUS

## 2016-01-30 NOTE — Progress Notes (Signed)
PROGRESS NOTE  Bevely Hackbart Guild  JXB:[id number] DOB: 02/20/1925  DOA: 01/11/2016   PCP: [name], MD   Brief Narrative:  82 year old female patient with a PMH of asthma, CAD, chronic diastolic CHF, DM, hypothyroid, GERD, HTN, presented to [facility] ED on 01/11/16 with 3 - 4 day history of dyspnea and productive cough, failed outpatient azithromycin, admitted with diagnosis of sepsis secondary to LLL CAP. Completed treatment for pneumonia. Persistent PSBO despite conservative management for several days. Status post diagnostic laparoscopy converted to exploratory laparotomy, lysis of adhesions 01/19/16 and transferred to stepdown unit postoperatively. Ongoing postop ileus with subsequent loose stools.   Assessment & Plan: Sepsis due to Right lower lobe community-acquired pneumonia, unknown pathogen  - Met sepsis criteria on admission. - Started empirically on Rocephin and azithromycin. Initially admitted to stepdown unit. - Blood cultures 2: Negative. Urine culture times one: Negative. Lactate: 1.9 > 1.2. Pro-calcitonin 0.26. - Has completed 8 days of IV antibiotics and antibiotics have been discontinued 5/31 - Sepsis resolved. Pt remains afebrile and with WBC WNL  Partial Small bowel obstruction, S/P diagnostic laparoscopy converted to exploratory laparotomy, lysis of adhesions 01/19/16  - s/p laparotomy and LOA on 01/19/16 - advanced diet, pt tolerating diet well,  Loose stools  - C. Diff negative, stool panel negative, d/c contact precautions  - Discussed with Dr. Penelope Coop GI specialist - he thinks this is due to recent ABX use - continue florastor and imodium as needed  Which seems to be helping  - would not recommend intervention such as colonoscopy given pt's age and recent surgery  - asked RN to monitor closely to get more accurate number of episodes and form  Acute kidney injury  - Presented with creatinine of 2.49. - resolved with IVF   Acute respiratory failure with hypoxia - Secondary to  community-acquired pneumonia. Resolved.  - Continue to provide pulmonary toilet, incentive spirometry and mobilization encouraged  - overall better, encouraged PT  - provided education on use of IS while awake to improve lung function  - OOB to chair as well   Acute blood loss anemia - Hemoglobin has gradually dropped from 9.3 on 6/2 > 7.5 on 6/5.  - no signs of bleeding, stable Hg in the past 24 hours  - transfuse if Hg < 7 - CBC in AM  Type II DM - Good inpatient control. - continue SSI   Chronic diastolic CHF - weight is also up from admission from 188 lbs --> 194 lbs --> 188 lbs --> 187 lbs --> 181 lbs  - will continue to monitor daily weights, strict I/O   Hypothyroid - Continue Synthroid . Continue to provide meds via IV while until pt able to tolerate PO  Essential hypertension - reasonable inpatient control   Hyperlipidemia - Statins to be continued    GERD - PPI IV  Anxiety & depression  - Continue home medications  - stable this AM  CAD - plavix restarted 6/10  - Cardiology had seen for preop clearance.  Hypokalemia - Resolved.  - BMP in AM  Acute urinary retention - Foley catheter had been removed postop on 6/3 but had to be replaced on 6/4 due to recurrent urinary retention.  - completed three days of Flomax, removed foley in AM  Asthma - Stable respiratory status, maintaining oxygen saturation at target range   Chronic LBBB, sinus tachycardia - Asymptomatic sinus tachycardia in the 110s.  - continue Metoprolol as needed   Morbid obesity due to excess calories  -  pt meets criteria for morbid obesity with BMI > 35 and underlying risk factors of HTN, HLD, DM - Body mass index is 34.38 kg/(m^2).   DVT prophylaxis: SCDs, subcutaneous heparin Code Status: Full Family Communication: Discussed with patient and son at bedside. Disposition Plan: DC to SNF when cleared by surgery team   Consultants:   General surgery  Cardiology.  Procedures:     NG tube  Foley catheter-DC 6/3. In and out cath when necessary  Right upper arm PICC 01/19/16 >  Status post diagnostic laparoscopy converted to exploratory laparotomy, lysis of adhesions 01/19/16   Antimicrobials:   IV Rocephin 5/24 > 5/31   Azithromycin  5/24 > 5/31    Subjective: Reports feeling better this AM.   Objective:  Filed Vitals:   01/29/16 2131 01/29/16 2348 01/30/16 0433 01/30/16 1209  BP: 113/40 126/46 138/59 127/50  Pulse: 108 104 104 103  Temp: 99.1 F (37.3 C) 98.2 F (36.8 C) 98 F (36.7 C) 98.5 F (36.9 C)  TempSrc: Oral Oral Oral Oral  Resp: 20 21 20 18   Height: 5' 1"  (1.549 m)     Weight: 82.555 kg (182 lb)  82.503 kg (181 lb 14.2 oz)   SpO2: 96% 96% 96% 98%    Intake/Output Summary (Last 24 hours) at 01/30/16 1437 Last data filed at 01/30/16 1130  Gross per 24 hour  Intake    700 ml  Output   1200 ml  Net   -500 ml   Filed Weights   01/29/16 0357 01/29/16 2131 01/30/16 0433  Weight: 84.868 kg (187 lb 1.6 oz) 82.555 kg (182 lb) 82.503 kg (181 lb 14.2 oz)    Examination:  General exam: Small built and frail pleasant elderly female lying comfortably supine in bed. Respiratory system: diminished breath sounds at bases with mild rales Cardiovascular system: S1 & S2 heard, tachy. No JVD, murmurs, rubs, gallops or clicks. Gastrointestinal system:  laparotomy site clean and dry, epigastric area non tender, good bowel sounds  Central nervous system: Alert and oriented. No focal neurological deficits.  Data Reviewed: I have personally reviewed following labs and imaging studies  CBC:  Recent Labs Lab 01/26/16 0358 01/27/16 0354 01/28/16 0500 01/29/16 0607 01/30/16 0505  WBC 5.9 5.1 4.4 4.4 4.7  NEUTROABS  --   --   --   --  2.4  HGB 7.7* 7.3* 7.7* 7.7* 8.3*  HCT 25.3* 24.4* 25.3* 26.0* 28.1*  MCV 81.4 81.6 82.7 82.3 84.4  PLT 228 252 266 254 643   Basic Metabolic Panel:  Recent Labs Lab 01/26/16 0358 01/27/16 0354  01/28/16 0500 01/29/16 0607 01/30/16 0505  NA 137 140 140 140 142  K 4.3 4.2 4.0 4.1 4.0  CL 104 111 108 109 112*  CO2 27 25 25 25 23   GLUCOSE 129* 112* 111* 122* 125*  BUN 21* 17 12 12 10   CREATININE 0.81 0.81 0.81 0.77 0.69  CALCIUM 8.7* 8.4* 8.9 8.8* 9.0  MG 2.0 1.9  --   --   --   PHOS 4.7*  --   --   --   --    Liver Function Tests:  Recent Labs Lab 01/26/16 0358 01/27/16 0354  AST 77* 50*  ALT 60* 52  ALKPHOS 56 60  BILITOT 0.4 0.5  PROT 5.3* 5.1*  ALBUMIN 2.1* 2.0*   CBG:  Recent Labs Lab 01/29/16 1920 01/30/16 0055 01/30/16 0427 01/30/16 0809 01/30/16 1251  GLUCAP 131* 150* 120* 129* 134*  Recent Results (from the past 240 hour(s))  Surgical pcr screen     Status: None   Collection Time: 01/19/16 11:59 AM  Result Value Ref Range Status   MRSA, PCR NEGATIVE NEGATIVE Final   Staphylococcus aureus NEGATIVE NEGATIVE Final    Radiology Studies: No results found.  Scheduled Meds: . acetaminophen  650 mg Oral Q6H  . antiseptic oral rinse  7 mL Mouth Rinse BID  . arformoterol  15 mcg Nebulization BID   And  . umeclidinium bromide  1 puff Inhalation Daily  . azelastine  1 spray Each Nare BID  . clopidogrel  75 mg Oral QPM  . clorazepate  15 mg Oral QHS  . ezetimibe  10 mg Oral Daily  . feeding supplement (GLUCERNA SHAKE)  237 mL Oral TID BM  . ferrous sulfate  325 mg Oral QPC supper  . heparin subcutaneous  5,000 Units Subcutaneous Q8H  . insulin aspart  0-15 Units Subcutaneous TID WC  . levothyroxine  25 mcg Oral QAC breakfast  . metoprolol tartrate  25 mg Oral BID  . montelukast  10 mg Oral QHS  . multivitamins with iron  1 tablet Oral QPC lunch  . QUEtiapine  50 mg Oral Q breakfast  . rosuvastatin  20 mg Oral QHS  . saccharomyces boulardii  250 mg Oral BID  . sodium chloride flush  10-40 mL Intracatheter Q12H  . sodium chloride flush  3 mL Intravenous Q12H  . tamsulosin  0.4 mg Oral QPC breakfast  . topiramate  25 mg Oral QHS    Continuous Infusions:     LOS: 19 days   Time spent: 20 minutes.  Faye Ramsay, MD [facility] Pager [phone]  If 7PM-7AM, please contact night-coverage [email or web address] Password [facility] 01/30/2016, 2:37 PM

## 2016-01-30 NOTE — Progress Notes (Signed)
Notified Dr. Doyle Askew that patient unable to void, had foley removed around 11:30 a.m. Bladder scan reveals greater than 350 ccs urine in bladder.  Dr. Doyle Askew gave orders for one time I&O cath with another trial of 6 hours to void, if bladder scan at that time reveals greater than 350 ccs urine reinsert foley.

## 2016-01-30 NOTE — Progress Notes (Signed)
Patient ID: [name], female   DOB: 06/07/1925, 82 y.o.   MRN: [id number]     [facility] [address] [facility] [place], [place] [address]    Phone: [phone] FAX: [phone]     Subjective: BM at 3AM and now, loose, but no watery diarrhea. Afebrile. WBC is normal.    Objective:  Vital signs:  Filed Vitals:   01/29/16 1951 01/29/16 2131 01/29/16 2348 01/30/16 0433  BP: 121/48 113/40 126/46 138/59  Pulse: 106 108 104 104  Temp:  99.1 F (37.3 C) 98.2 F (36.8 C) 98 F (36.7 C)  TempSrc:  Oral Oral Oral  Resp: 19 20 21 20   Height:  5' 1"  (1.549 m)    Weight:  82.555 kg (182 lb)  82.503 kg (181 lb 14.2 oz)  SpO2: 96% 96% 96% 96%    Last BM Date: 01/29/16  Intake/Output   Yesterday:  06/11 0701 - 06/12 0700 In: 460 [P.O.:460] Out: 1250 [Urine:1250] This shift:  Total I/O In: 240 [P.O.:240] Out: -    Physical Exam: General: Pt awake/alert/oriented x4 in no acute distress Abdomen: Soft. Nondistended. Mildly tender at incisions only. Staples in place, no erythema. No evidence of peritonitis. No incarcerated hernias.   Problem List:   Principal Problem:   Sepsis due to pneumonia [facility]) Active Problems:   Diabetes mellitus type 2, controlled ([place])   Coronary atherosclerosis   Asthma with bronchitis   HTN (hypertension)   HLD (hyperlipidemia)   Hypothyroid   GERD (gastroesophageal reflux disease)   Diabetes mellitus type 2 without retinopathy ([place])   Sepsis ([place])   Acute on chronic respiratory failure ([facility])   SBO (small bowel obstruction) ([place])   Hypokalemia   Preop cardiovascular exam    Results:   Labs: Results for orders placed or performed during the hospital encounter of 01/11/16 (from the past 48 hour(s))  Glucose, capillary     Status: Abnormal   Collection Time: 01/28/16 11:09 AM  Result Value Ref Range   Glucose-Capillary 143 (H) 65 - 99 mg/dL   Comment 1 Notify RN   Glucose, capillary      Status: Abnormal   Collection Time: 01/28/16  3:15 PM  Result Value Ref Range   Glucose-Capillary 114 (H) 65 - 99 mg/dL   Comment 1 Notify RN   Glucose, capillary     Status: Abnormal   Collection Time: 01/28/16  8:12 PM  Result Value Ref Range   Glucose-Capillary 138 (H) 65 - 99 mg/dL  Glucose, capillary     Status: Abnormal   Collection Time: 01/29/16 12:26 AM  Result Value Ref Range   Glucose-Capillary 138 (H) 65 - 99 mg/dL   Comment 1 Notify RN   Glucose, capillary     Status: Abnormal   Collection Time: 01/29/16  3:56 AM  Result Value Ref Range   Glucose-Capillary 129 (H) 65 - 99 mg/dL   Comment 1 Notify RN   CBC     Status: Abnormal   Collection Time: 01/29/16  6:07 AM  Result Value Ref Range   WBC 4.4 4.0 - 10.5 K/uL   RBC 3.16 (L) 3.87 - 5.11 MIL/uL   Hemoglobin 7.7 (L) 12.0 - 15.0 g/dL   HCT 26.0 (L) 36.0 - 46.0 %   MCV 82.3 78.0 - 100.0 fL   MCH 24.4 (L) 26.0 - 34.0 pg   MCHC 29.6 (L) 30.0 - 36.0 g/dL   RDW 16.0 (  H) 11.5 - 15.5 %   Platelets 254 150 - 400 K/uL  Basic metabolic panel     Status: Abnormal   Collection Time: 01/29/16  6:07 AM  Result Value Ref Range   Sodium 140 135 - 145 mmol/L   Potassium 4.1 3.5 - 5.1 mmol/L   Chloride 109 101 - 111 mmol/L   CO2 25 22 - 32 mmol/L   Glucose, Bld 122 (H) 65 - 99 mg/dL   BUN 12 6 - 20 mg/dL   Creatinine, Ser 0.77 0.44 - 1.00 mg/dL   Calcium 8.8 (L) 8.9 - 10.3 mg/dL   GFR calc non Af Amer >60 >60 mL/min   GFR calc Af Amer >60 >60 mL/min    Comment: (NOTE) The eGFR has been calculated using the CKD EPI equation. This calculation has not been validated in all clinical situations. eGFR's persistently <60 mL/min signify possible Chronic Kidney Disease.    Anion gap 6 5 - 15  Glucose, capillary     Status: Abnormal   Collection Time: 01/29/16  8:20 AM  Result Value Ref Range   Glucose-Capillary 112 (H) 65 - 99 mg/dL   Comment 1 Notify RN   Glucose, capillary     Status: Abnormal   Collection Time:  01/29/16 11:26 AM  Result Value Ref Range   Glucose-Capillary 159 (H) 65 - 99 mg/dL   Comment 1 Notify RN   Glucose, capillary     Status: Abnormal   Collection Time: 01/29/16  3:48 PM  Result Value Ref Range   Glucose-Capillary 139 (H) 65 - 99 mg/dL   Comment 1 Notify RN   Glucose, capillary     Status: Abnormal   Collection Time: 01/29/16  7:20 PM  Result Value Ref Range   Glucose-Capillary 131 (H) 65 - 99 mg/dL  Glucose, capillary     Status: Abnormal   Collection Time: 01/30/16 12:55 AM  Result Value Ref Range   Glucose-Capillary 150 (H) 65 - 99 mg/dL  Glucose, capillary     Status: Abnormal   Collection Time: 01/30/16  4:27 AM  Result Value Ref Range   Glucose-Capillary 120 (H) 65 - 99 mg/dL  CBC     Status: Abnormal   Collection Time: 01/30/16  5:05 AM  Result Value Ref Range   WBC 4.7 4.0 - 10.5 K/uL   RBC 3.33 (L) 3.87 - 5.11 MIL/uL   Hemoglobin 8.3 (L) 12.0 - 15.0 g/dL   HCT 28.1 (L) 36.0 - 46.0 %   MCV 84.4 78.0 - 100.0 fL   MCH 24.9 (L) 26.0 - 34.0 pg   MCHC 29.5 (L) 30.0 - 36.0 g/dL   RDW 16.7 (H) 11.5 - 15.5 %   Platelets 297 150 - 400 K/uL  Differential     Status: None   Collection Time: 01/30/16  5:05 AM  Result Value Ref Range   Neutrophils Relative % 52 %   Neutro Abs 2.4 1.7 - 7.7 K/uL   Lymphocytes Relative 25 %   Lymphs Abs 1.1 0.7 - 4.0 K/uL   Monocytes Relative 14 %   Monocytes Absolute 0.6 0.1 - 1.0 K/uL   Eosinophils Relative 9 %   Eosinophils Absolute 0.4 0.0 - 0.7 K/uL   Basophils Relative 2 %   Basophils Absolute 0.1 0.0 - 0.1 K/uL  Basic metabolic panel     Status: Abnormal   Collection Time: 01/30/16  5:05 AM  Result Value Ref Range   Sodium 142 135 - 145 mmol/L  Potassium 4.0 3.5 - 5.1 mmol/L   Chloride 112 (H) 101 - 111 mmol/L   CO2 23 22 - 32 mmol/L   Glucose, Bld 125 (H) 65 - 99 mg/dL   BUN 10 6 - 20 mg/dL   Creatinine, Ser 0.69 0.44 - 1.00 mg/dL   Calcium 9.0 8.9 - 10.3 mg/dL   GFR calc non Af Amer >60 >60 mL/min   GFR calc  Af Amer >60 >60 mL/min    Comment: (NOTE) The eGFR has been calculated using the CKD EPI equation. This calculation has not been validated in all clinical situations. eGFR's persistently <60 mL/min signify possible Chronic Kidney Disease.    Anion gap 7 5 - 15  Glucose, capillary     Status: Abnormal   Collection Time: 01/30/16  8:09 AM  Result Value Ref Range   Glucose-Capillary 129 (H) 65 - 99 mg/dL    Imaging / Studies: No results found.  Medications / Allergies:  Scheduled Meds: . acetaminophen  650 mg Oral Q6H  . antiseptic oral rinse  7 mL Mouth Rinse BID  . arformoterol  15 mcg Nebulization BID   And  . umeclidinium bromide  1 puff Inhalation Daily  . azelastine  1 spray Each Nare BID  . clopidogrel  75 mg Oral QPM  . clorazepate  15 mg Oral QHS  . ezetimibe  10 mg Oral Daily  . feeding supplement (GLUCERNA SHAKE)  237 mL Oral TID BM  . ferrous sulfate  325 mg Oral QPC supper  . heparin subcutaneous  5,000 Units Subcutaneous Q8H  . insulin aspart  0-15 Units Subcutaneous Q4H  . levothyroxine  25 mcg Oral QAC breakfast  . metoprolol tartrate  25 mg Oral BID  . montelukast  10 mg Oral QHS  . multivitamins with iron  1 tablet Oral QPC lunch  . QUEtiapine  50 mg Oral Q breakfast  . rosuvastatin  20 mg Oral QHS  . saccharomyces boulardii  250 mg Oral BID  . sodium chloride flush  10-40 mL Intracatheter Q12H  . sodium chloride flush  3 mL Intravenous Q12H  . tamsulosin  0.4 mg Oral QPC breakfast  . topiramate  25 mg Oral QHS   Continuous Infusions:  PRN Meds:.albuterol, ipratropium-albuterol, loperamide, LORazepam, morphine injection, nitroGLYCERIN, ondansetron **OR** ondansetron (ZOFRAN) IV, oxyCODONE, phenol, promethazine, simethicone, sodium chloride flush  Antibiotics: Anti-infectives    Start     Dose/Rate Route Frequency Ordered Stop   01/19/16 1313  cefoTEtan in Dextrose 5% (CEFOTAN) 2-2.08 GM-% IVPB    Comments:  Sammuel Cooper   : cabinet override       01/19/16 1313 01/20/16 0129   01/19/16 1230  cefoTEtan (CEFOTAN) 2 g in dextrose 5 % 50 mL IVPB     2 g 100 mL/hr over 30 Minutes Intravenous To Surgery 01/19/16 1223 01/19/16 1422   01/12/16 0900  cefTRIAXone (ROCEPHIN) 1 g in dextrose 5 % 50 mL IVPB     1 g 100 mL/hr over 30 Minutes Intravenous Every 24 hours 01/11/16 1251 01/18/16 1018   01/12/16 0900  azithromycin (ZITHROMAX) 500 mg in dextrose 5 % 250 mL IVPB     500 mg 250 mL/hr over 60 Minutes Intravenous Every 24 hours 01/11/16 1251 01/18/16 1048   01/11/16 0830  azithromycin (ZITHROMAX) 500 mg in dextrose 5 % 250 mL IVPB     500 mg 250 mL/hr over 60 Minutes Intravenous  Once 01/11/16 0820 01/11/16 0935   01/11/16 0830  cefTRIAXone (ROCEPHIN)  1 g in dextrose 5 % 50 mL IVPB     1 g 100 mL/hr over 30 Minutes Intravenous  Once 01/11/16 0820 01/11/16 0947         Assessment/Plan PSBO POD#11 diagnostic laparoscopy, laparotomy, LOA---Dr. Redmond Pulling 01/19/16 -tolerating POs, up to chair, PT, pain controlled. -plan to remove staples prior to DC  Urinary retention-flomax, voiding trial today  ID-treated for PNA/sepsis. Diarrhea, c diff negative 6/7.no copious amounts of diarrhea, loose stool this AM  FEN-appetite is fair, glucerna shakes. Antiemetics prn DVT prophylaxis-scd/heparin ABLA-stable  Dispo-SNF when medically ready   Erby Pian, [facility] Pager [phone](7A-4:30P) For consults and floor pages call [phone](7A-4:30P)  01/30/2016 9:24 AM

## 2016-01-30 NOTE — Progress Notes (Signed)
Nutrition Follow-up  DOCUMENTATION CODES:   Non-severe (moderate) malnutrition in context of chronic illness, Obesity unspecified  INTERVENTION:  Continue Glucerna Shake po TID, each supplement provides 220 kcal and 10 grams of protein   NUTRITION DIAGNOSIS:   Malnutrition related to chronic illness as evidenced by mild depletion of muscle mass, percent weight loss (10% weight loss within 6 months).  Ongoing  GOAL:   Patient will meet greater than or equal to 90% of their needs  Unmet/improving  MONITOR:   PO intake, Labs, Weight trends, Skin, I & O's  REASON FOR ASSESSMENT:   Consult New TPN/TNA  ASSESSMENT:   82 y.o. female with a Past Medical History of asthma, CAD, DM, hypogonadism, GERD, CAD, who presents with sepsis from CAP and possible UTI.  Pt was advanced to a Carb Modified diet on 6/7 and TPN was discontinued on 6/9. Pt asleep at time of visit, unable to awake. Per RN, pt is drinking the Glucerna Shakes very well and eating about 40-50% of meals today. Per MD note, stools are loose, but no longer watery. Per nursing notes, pt has been eating 25-50% of meals. Per weight history, weight is stable.   Labs: low hemoglobin; glucose ranging 112 to 159 mg/dL  Diet Order:  Diet Carb Modified Fluid consistency:: Thin; Room service appropriate?: Yes  Skin:  Wound (see comment) (MSAD on buttocks and vagina; closed abdominal incision)  Last BM:  6/12  Height:   Ht Readings from Last 1 Encounters:  01/29/16 5\' 1"  (1.549 m)    Weight:   Wt Readings from Last 1 Encounters:  01/30/16 181 lb 14.2 oz (82.503 kg)    Ideal Body Weight:  47.7 kg  BMI:  Body mass index is 34.38 kg/(m^2).  Estimated Nutritional Needs:   Kcal:  1700-1900  Protein:  100-120 gm  Fluid:  1.7-2 L  EDUCATION NEEDS:   No education needs identified at this time  [place], LDN Inpatient Clinical Dietitian Pager: [phone] After Hours Pager: [phone]

## 2016-01-30 NOTE — Progress Notes (Signed)
Physical Therapy Treatment Patient Details Name: [name] MRN: [id number] DOB: 1925-07-31 Today's Date: 01/30/2016    History of Present Illness 82 y.o. female adm with 3-4d of SOB, also with abd distension, found to have SBO d/y adhesion;    PMHx: Asthma not on home oxygen, CAD, DM2, hypothyroidism, GERD, CAD, OA, R TKA.  Pt s/p exploratory laparotomy due to partial SBO on 01/19/16.      PT Comments    [name] requires encouragement to ambulate but is agreeable to therapy today. She currently requires min assist to stand and min guard assist to ambulate short distance in her room.  Pt will benefit from continued skilled PT services to increase functional independence and safety.   Follow Up Recommendations  SNF     Equipment Recommendations  Other (comment) (TBD at next venue of care)    Recommendations for Other Services       Precautions / Restrictions Precautions Precautions: Fall Precaution Comments: monitor HR Restrictions Weight Bearing Restrictions: No    Mobility  Bed Mobility Overal bed mobility: Needs Assistance Bed Mobility: Rolling;Sidelying to Sit Rolling: Min assist Sidelying to sit: Min assist;HOB elevated       General bed mobility comments: Cues for log roll technique and assist managing Bil LEs and to elevate trunk. Pt uses bed rail w/ increased time and effort.  Transfers Overall transfer level: Needs assistance Equipment used: Rolling walker (2 wheeled) Transfers: Sit to/from Stand Sit to Stand: Min assist         General transfer comment: Cues for hand placement.  Pt slow to stand, min assist to boost and steady.  Ambulation/Gait Ambulation/Gait assistance: Min guard Ambulation Distance (Feet): 40 Feet Assistive device: Rolling walker (2 wheeled) Gait Pattern/deviations: Step-through pattern;Decreased stride length;Trunk flexed   Gait velocity interpretation: <1.8 ft/sec, indicative of risk for recurrent falls General Gait Details:  Cues for upright posture and forward gaze as pt demonstrates flexed posture.  HR up to 131.  Lt knee partial buckle but did not continue to do so once cues provided to maintain Lt knee extension.   Stairs            Wheelchair Mobility    Modified Rankin (Stroke Patients Only)       Balance Overall balance assessment: Needs assistance Sitting-balance support: No upper extremity supported;Feet supported Sitting balance-Leahy Scale: Fair     Standing balance support: Bilateral upper extremity supported;During functional activity Standing balance-Leahy Scale: Poor Standing balance comment: Relies on UE support                    Cognition Arousal/Alertness: Awake/alert Behavior During Therapy: Anxious;WFL for tasks assessed/performed Overall Cognitive Status: Within Functional Limits for tasks assessed                      Exercises General Exercises - Lower Extremity Ankle Circles/Pumps: AROM;Both;10 reps;Supine Quad Sets: Strengthening;Both;10 reps;Supine    General Comments General comments (skin integrity, edema, etc.): Pt required encouragement to ambulate       Pertinent Vitals/Pain Pain Assessment: Faces Faces Pain Scale: Hurts a little bit Pain Location: abdomen Pain Descriptors / Indicators: Operative site guarding Pain Intervention(s): Limited activity within patient's tolerance;Monitored during session;Repositioned    Home Living                      Prior Function            PT Goals (current goals can  now be found in the care plan section) Acute Rehab PT Goals Patient Stated Goal: none stated PT Goal Formulation: With patient Time For Goal Achievement: 02/03/16 Potential to Achieve Goals: Good Progress towards PT goals: Progressing toward goals (modestly)    Frequency  Min 2X/week    PT Plan Current plan remains appropriate    Co-evaluation             End of Session Equipment Utilized During Treatment:  Gait belt Activity Tolerance: Patient limited by pain;Patient limited by fatigue Patient left: in chair;with call bell/phone within reach;with chair alarm set;with nursing/sitter in room     Time: [id number] PT Time Calculation (min) (ACUTE ONLY): 32 min  Charges:  $Gait Training: 8-22 mins $Therapeutic Exercise: 8-22 mins                    G Codes:       Collie Siad PT, DPT  Pager: [phone] Phone: [phone] 01/30/2016, 2:38 PM

## 2016-01-30 NOTE — Care Management Important Message (Signed)
Important Message  Patient Details  Name: [name] MRN: [id number] Date of Birth: [birth year]   Medicare Important Message Given:  Yes    Loann Quill 01/30/2016, 1:48 PM

## 2016-01-30 NOTE — Progress Notes (Signed)
In and out cath performed, 250 ccs yellow urine returned.  Patient tolerated well.

## 2016-01-30 NOTE — Consult Note (Signed)
   [facility] CM Inpatient Consult   01/30/2016  [name] 1924-08-29 [id number]  Update:  Chart review reveals that the patient has a planned discharg to a Skilled nursing facility.  Met with the patient and given a brochure.  She was encouraged to share information with her family for post rehab needs and for questions for her benefit with [facility] services.  For questions, please contact:  Natividad Brood, RN BSN [facility] Liaison  [phone] business mobile phone Toll free office [phone]

## 2016-01-30 NOTE — Progress Notes (Signed)
On 7a-7 p shift patient had 1 large BM early in the shift with 1 small BM around 6:30 p.m.

## 2016-01-30 NOTE — Progress Notes (Signed)
Patient arrive to 3E23 from [facility]. No complaints of pain. VSS. Foley cath draining clear yellow urine. Foley cath in place for urinary retention. Attempted to remove catheter 2 times on 3S. MD to address removal of foley catheter. Patient reported to have several loose stools before transfer. C-diff tested negative.  Abdominal mid-line incision approximated and staples in place. SR on telemetry. Foam dressing to sacrum for prevention. MASD to groin and bottom. High fall risk. Bed alarm on.  Patient oriented to call light and room.

## 2016-01-31 DIAGNOSIS — I10 Essential (primary) hypertension: Secondary | ICD-10-CM

## 2016-01-31 LAB — BASIC METABOLIC PANEL
ANION GAP: 7 (ref 5–15)
BUN: 14 mg/dL (ref 6–20)
CHLORIDE: 109 mmol/L (ref 101–111)
CO2: 24 mmol/L (ref 22–32)
Calcium: 8.8 mg/dL — ABNORMAL LOW (ref 8.9–10.3)
Creatinine, Ser: 0.8 mg/dL (ref 0.44–1.00)
GFR calc Af Amer: >60 mL/min (ref >60)
GFR calc non Af Amer: >60 mL/min (ref >60)
GLUCOSE: 130 mg/dL — AB (ref 65–99)
POTASSIUM: 4 mmol/L (ref 3.5–5.1)
Sodium: 140 mmol/L (ref 135–145)

## 2016-01-31 LAB — CBC
HEMATOCRIT: 26.3 % — AB (ref 36.0–46.0)
Hemoglobin: 7.8 g/dL — ABNORMAL LOW (ref 12.0–15.0)
MCH: 24.7 pg — AB (ref 26.0–34.0)
MCHC: 29.7 g/dL — ABNORMAL LOW (ref 30.0–36.0)
MCV: 83.2 fL (ref 78.0–100.0)
PLATELETS: 291 10*3/uL (ref 150–400)
RBC: 3.16 MIL/uL — AB (ref 3.87–5.11)
RDW: 17 % — ABNORMAL HIGH (ref 11.5–15.5)
WBC: 4.5 10*3/uL (ref 4.0–10.5)

## 2016-01-31 LAB — GLUCOSE, CAPILLARY
GLUCOSE-CAPILLARY: 89 mg/dL (ref 65–99)
Glucose-Capillary: 124 mg/dL — ABNORMAL HIGH (ref 65–99)
Glucose-Capillary: 132 mg/dL — ABNORMAL HIGH (ref 65–99)
Glucose-Capillary: 118 mg/dL — ABNORMAL HIGH (ref 65–99)

## 2016-01-31 MED ORDER — LOPERAMIDE HCL 2 MG PO CAPS
2.0000 mg | ORAL_CAPSULE | Freq: Four times a day (QID) | ORAL | Status: DC
  Administered 2016-01-31 – 2016-02-02 (×8): 2 mg via ORAL
  Filled 2016-01-31 (×8): qty 1

## 2016-01-31 NOTE — Progress Notes (Signed)
Patient ID: [name], female   DOB: 1925-06-29, 82 y.o.   MRN: [id number]   LOS: 20 days   Subjective: Feels worn out. Still having loose stools, 3 yesterday and 2 already this morning. Only getting loperamide sporadically.   Objective: Vital signs in last 24 hours: Temp:  [97.9 F (36.6 C)-98.6 F (37 C)] 98.6 F (37 C) (06/13 0429) Pulse Rate:  [86-106] 92 (06/13 0429) Resp:  [18] 18 (06/13 0429) BP: (122-135)/(44-56) 135/56 mmHg (06/13 0429) SpO2:  [94 %-99 %] 96 % (06/13 0905) Weight:  [83.28 kg (183 lb 9.6 oz)] 83.28 kg (183 lb 9.6 oz) (06/13 0429) Last BM Date: 01/30/16   Laboratory  CBC  Recent Labs  01/30/16 0505 01/31/16 0500  WBC 4.7 4.5  HGB 8.3* 7.8*  HCT 28.1* 26.3*  PLT 297 291   BMET  Recent Labs  01/30/16 0505 01/31/16 0500  NA 142 140  K 4.0 4.0  CL 112* 109  CO2 23 24  GLUCOSE 125* 130*  BUN 10 14  CREATININE 0.69 0.80  CALCIUM 9.0 8.8*    Physical Exam General appearance: alert and no distress Resp: clear to auscultation bilaterally Cardio: regular rate and rhythm GI: Soft, +BS, incision C/D/I   Assessment/Plan: PSBO POD#12 diagnostic laparoscopy, laparotomy, LOA---Dr. Redmond Pulling 01/19/16 -tolerating POs, up to chair, PT, pain controlled. -plan to remove staples prior to DC  Will schedule loperamide to see if this will help Urinary retention- Having trouble voiding still ID-treated for PNA/sepsis. Diarrhea, c diff negative 6/7.no copious amounts of diarrhea, loose stool this AM  FEN-appetite is fair, glucerna shakes. Antiemetics prn DVT prophylaxis-scd/heparin ABLA-stable  Dispo-SNF when medically ready    Lisette Abu, PA-C Pager: [phone] General Trauma PA Pager: [phone]  01/31/2016

## 2016-01-31 NOTE — Progress Notes (Signed)
Pt. With no urine output post cath. Bladder scan shows 312cc in bladder. RN will continue to monitor. Elisa Kutner, Katherine Roan

## 2016-01-31 NOTE — Clinical Social Work Note (Signed)
CSW continues to follow for discharge needs.  Lani Havlik, CSW [phone]  

## 2016-01-31 NOTE — Progress Notes (Signed)
Occupational Therapy Treatment Patient Details Name: [name] MRN: [id number] DOB: 1925/08/15 Today's Date: 01/31/2016    History of present illness 82 y.o. female adm with 3-4d of SOB, also with abd distension, found to have SBO d/y adhesion;    PMHx: Asthma not on home oxygen, CAD, DM2, hypothyroidism, GERD, CAD, OA, R TKA.  Pt s/p exploratory laparotomy due to partial SBO on 01/19/16.     OT comments  Pt making good progress towards OT goals, continue plan of care for now.   Follow Up Recommendations  SNF;Supervision/Assistance - 24 hour    Equipment Recommendations  Other (comment) (TBD)    Recommendations for Other Services  None at this time  Precautions / Restrictions Precautions Precautions: Fall Precaution Comments: monitor HR Restrictions Weight Bearing Restrictions: No    Mobility Bed Mobility Overal bed mobility: Needs Assistance Bed Mobility: Rolling;Sidelying to Sit Rolling: Min assist Sidelying to sit: Min assist;HOB elevated       General bed mobility comments: Cues for log roll technique and assist managing Bil LEs and to elevate trunk. Pt uses bed rail w/ increased time and effort.  Transfers Overall transfer level: Needs assistance Equipment used: None;1 person hand held assist Transfers: Sit to/from [facility] Sit to Stand: Min assist Stand pivot transfers: Min assist       General transfer comment: Cues for hand placement.  Pt slow to stand, min assist to boost and steady. Sit to stand from EOB, stand pivot EOB to BSC, sit to stand from [facility], stand pivot BSC to recliner.     Balance Overall balance assessment: Needs assistance Sitting-balance support: No upper extremity supported;Feet supported Sitting balance-Leahy Scale: Fair     Standing balance support: No upper extremity supported;During functional activity Standing balance-Leahy Scale: Poor Standing balance comment: needs UE support   ADL Overall ADL's : Needs  assistance/impaired Eating/Feeding: Set up;Sitting   Grooming: Set up;Sitting   Upper Body Bathing: Set up;Sitting   Lower Body Bathing: Moderate assistance;Sit to/from stand   Upper Body Dressing : Minimal assistance;Sitting   Lower Body Dressing: Moderate assistance;Sit to/from stand   Toilet Transfer: Minimal assistance;Stand-pivot;BSC   Toileting- Clothing Manipulation and Hygiene: Moderate assistance;Sit to/from stand         General ADL Comments: Pt moves very slowly and requires encouragement            Cognition   Behavior During Therapy: Anxious;WFL for tasks assessed/performed Overall Cognitive Status: Within Functional Limits for tasks assessed                   Pertinent Vitals/ Pain       Pain Assessment: Faces Faces Pain Scale: Hurts little more Pain Location: abdomen Pain Descriptors / Indicators: Aching;Grimacing;Guarding Pain Intervention(s): Limited activity within patient's tolerance;Monitored during session;Repositioned   Frequency Min 2X/week     Progress Toward Goals  OT Goals(current goals can now befound in the care plan section)  Progress towards OT goals: Progressing toward goals  Acute Rehab OT Goals Patient Stated Goal: none stated OT Goal Formulation: With patient Time For Goal Achievement: 02/06/16 Potential to Achieve Goals: Good  Plan Discharge plan remains appropriate       End of Session Equipment Utilized During Treatment: Gait belt   Activity Tolerance Patient tolerated treatment well   Patient Left in chair;with call bell/phone within reach;with chair alarm set     Time: [id number] OT Time Calculation (min): 22 min  Charges: OT General Charges $OT Visit: 1 Procedure  OT Treatments $Self Care/Home Management : 8-22 mins  Chrys Racer , MS, OTR/L, [place] Pager: [phone]  01/31/2016, 1:37 PM

## 2016-01-31 NOTE — Progress Notes (Signed)
PROGRESS NOTE  [name]  [id number] DOB: Nov 11, 1924  DOA: 01/11/2016   PCP: [name], MD   Brief Narrative:  82 year old female patient with a PMH of asthma, CAD, chronic diastolic CHF, DM, hypothyroid, GERD, HTN, presented to [facility] ED on 01/11/16 with 3 - 4 day history of dyspnea and productive cough, failed outpatient azithromycin, admitted with diagnosis of sepsis secondary to LLL CAP. Completed treatment for pneumonia. Persistent PSBO despite conservative management for several days. Status post diagnostic laparoscopy converted to exploratory laparotomy, lysis of adhesions 01/19/16 and transferred to stepdown unit postoperatively. Ongoing postop ileus with subsequent loose stools.   Assessment & Plan: Sepsis due to Right lower lobe community-acquired pneumonia, unknown pathogen  - Met sepsis criteria on admission. - Started empirically on Rocephin and azithromycin. Initially admitted to stepdown unit. - Blood cultures 2: Negative. Urine culture times one:  - Has completed 8 days of IV antibiotics and antibiotics have been discontinued 5/31 - Sepsis resolved. Pt remains afebrile and with WBC WNL  Partial Small bowel obstruction, S/P diagnostic laparoscopy converted to exploratory laparotomy, lysis of adhesions 01/19/16  - s/p laparotomy and LOA on 01/19/16, post op day #12 - advanced diet, pt tolerating diet well - still with loose stools   Loose stools  - C. Diff negative, stool panel negative, d/c contact precautions  - Discussed with Dr. Penelope Coop GI specialist - he thinks this is due to recent ABX use - continue florastor and imodium as needed  - would not recommend intervention such as colonoscopy given pt's age and recent surgery  - asked RN to monitor closely to get more accurate number of episodes and form  Acute kidney injury  - Presented with creatinine of 2.49. - resolved with IVF  - BMP In AM  Acute respiratory failure with hypoxia - Secondary to community-acquired  pneumonia. Resolved.  - Continue to provide pulmonary toilet, incentive spirometry and mobilization encouraged  - overall better, encouraged PT  - provided education on use of IS while awake to improve lung function  - OOB to chair as well   Acute blood loss anemia - Hemoglobin has gradually dropped from 9.3 on 6/2 and now down to 7.8 - no signs of bleeding, stable Hg in the past 24 hours  - transfuse if Hg < 7 - CBC in AM  Type II DM - Good inpatient control. - continue SSI   Chronic diastolic CHF - weight is also up from admission from 188 lbs --> 194 lbs --> 188 lbs --> 187 lbs --> 183 lbs  - will continue to monitor daily weights, strict I/O   Hypothyroid - Continue Synthroid . Continue to provide meds via IV while until pt able to tolerate PO  Essential hypertension - reasonable inpatient control   Hyperlipidemia - Statins to be continued    GERD - PPI IV  Anxiety & depression  - Continue home medications  - stable this AM  CAD - plavix restarted 6/10  - Cardiology had seen for preop clearance.  Hypokalemia - Resolved.  - BMP in AM  Acute urinary retention - Foley catheter had been removed postop on 6/3 but had to be replaced on 6/4 due to recurrent urinary retention.  - foley taken out 6/12 again and pt with persistent urinary retention - will monitor today and consider urology consult if not better   Asthma - Stable respiratory status, maintaining oxygen saturation at target range   Chronic LBBB, sinus tachycardia - Asymptomatic sinus tachycardia in  the 110s.  - continue Metoprolol as needed   Morbid obesity due to excess calories  - pt meets criteria for morbid obesity with BMI > 35 and underlying risk factors of HTN, HLD, DM - Body mass index is 34.71 kg/(m^2).   DVT prophylaxis: SCDs, subcutaneous heparin Code Status: Full Family Communication: Discussed with patient and son at bedside. Disposition Plan: DC to SNF when diarrhea improved     Consultants:   General surgery  Cardiology.  Procedures:   NG tube  Foley catheter-DC 6/3. In and out cath when necessary  Right upper arm PICC 01/19/16 >  Status post diagnostic laparoscopy converted to exploratory laparotomy, lysis of adhesions 01/19/16   Antimicrobials:   IV Rocephin 5/24 > 5/31   Azithromycin  5/24 > 5/31    Subjective: Reports feeling better this AM but still with loose stools.   Objective:  Filed Vitals:   01/31/16 0048 01/31/16 0429 01/31/16 0905 01/31/16 1225  BP: 122/44 135/56  131/57  Pulse: 86 92  108  Temp: 98.2 F (36.8 C) 98.6 F (37 C)  98.7 F (37.1 C)  TempSrc: Oral Oral  Oral  Resp: 18 18  18   Height:      Weight:  83.28 kg (183 lb 9.6 oz)    SpO2: 99% 94% 96% 96%    Intake/Output Summary (Last 24 hours) at 01/31/16 1759 Last data filed at 01/31/16 1433  Gross per 24 hour  Intake    840 ml  Output    250 ml  Net    590 ml   Filed Weights   01/29/16 2131 01/30/16 0433 01/31/16 0429  Weight: 82.555 kg (182 lb) 82.503 kg (181 lb 14.2 oz) 83.28 kg (183 lb 9.6 oz)    Examination:  General exam: Small built and frail pleasant elderly female lying comfortably in bed Respiratory system: diminished breath sounds at bases, no wheezing  Cardiovascular system: S1 & S2 heard, tachy. No JVD, murmurs, rubs, gallops or clicks. Gastrointestinal system:  laparotomy site clean and dry, epigastric area non tender, good bowel sounds  Central nervous system: Alert and oriented. No focal neurological deficits.  Data Reviewed: I have personally reviewed following labs and imaging studies  CBC:  Recent Labs Lab 01/27/16 0354 01/28/16 0500 01/29/16 0607 01/30/16 0505 01/31/16 0500  WBC 5.1 4.4 4.4 4.7 4.5  NEUTROABS  --   --   --  2.4  --   HGB 7.3* 7.7* 7.7* 8.3* 7.8*  HCT 24.4* 25.3* 26.0* 28.1* 26.3*  MCV 81.6 82.7 82.3 84.4 83.2  PLT 252 266 254 297 953   Basic Metabolic Panel:  Recent Labs Lab 01/26/16 0358  01/27/16 0354 01/28/16 0500 01/29/16 0607 01/30/16 0505 01/31/16 0500  NA 137 140 140 140 142 140  K 4.3 4.2 4.0 4.1 4.0 4.0  CL 104 111 108 109 112* 109  CO2 27 25 25 25 23 24   GLUCOSE 129* 112* 111* 122* 125* 130*  BUN 21* 17 12 12 10 14   CREATININE 0.81 0.81 0.81 0.77 0.69 0.80  CALCIUM 8.7* 8.4* 8.9 8.8* 9.0 8.8*  MG 2.0 1.9  --   --   --   --   PHOS 4.7*  --   --   --   --   --    Liver Function Tests:  Recent Labs Lab 01/26/16 0358 01/27/16 0354  AST 77* 50*  ALT 60* 52  ALKPHOS 56 60  BILITOT 0.4 0.5  PROT 5.3* 5.1*  ALBUMIN  2.1* 2.0*   CBG:  Recent Labs Lab 01/30/16 1602 01/30/16 2119 01/31/16 0603 01/31/16 1120 01/31/16 1647  GLUCAP 120* 137* 124* 132* 89    Recent Results (from the past 240 hour(s))  Surgical pcr screen     Status: None   Collection Time: 01/19/16 11:59 AM  Result Value Ref Range Status   MRSA, PCR NEGATIVE NEGATIVE Final   Staphylococcus aureus NEGATIVE NEGATIVE Final    Radiology Studies: No results found.  Scheduled Meds: . acetaminophen  650 mg Oral Q6H  . antiseptic oral rinse  7 mL Mouth Rinse BID  . arformoterol  15 mcg Nebulization BID   And  . umeclidinium bromide  1 puff Inhalation Daily  . azelastine  1 spray Each Nare BID  . clopidogrel  75 mg Oral QPM  . clorazepate  15 mg Oral QHS  . ezetimibe  10 mg Oral Daily  . feeding supplement (GLUCERNA SHAKE)  237 mL Oral TID BM  . ferrous sulfate  325 mg Oral QPC supper  . heparin subcutaneous  5,000 Units Subcutaneous Q8H  . insulin aspart  0-15 Units Subcutaneous TID WC  . levothyroxine  25 mcg Oral QAC breakfast  . loperamide  2 mg Oral Q6H  . metoprolol tartrate  25 mg Oral BID  . montelukast  10 mg Oral QHS  . multivitamins with iron  1 tablet Oral QPC lunch  . QUEtiapine  50 mg Oral Q breakfast  . rosuvastatin  20 mg Oral QHS  . saccharomyces boulardii  250 mg Oral BID  . sodium chloride flush  10-40 mL Intracatheter Q12H  . sodium chloride flush  3 mL  Intravenous Q12H  . tamsulosin  0.4 mg Oral QPC breakfast  . topiramate  25 mg Oral QHS   Continuous Infusions:     LOS: 20 days   Time spent: 20 minutes.  Faye Ramsay, MD [facility] Pager [phone]  If 7PM-7AM, please contact night-coverage [email or web address] Password [facility] 01/31/2016, 5:59 PM

## 2016-02-01 DIAGNOSIS — E785 Hyperlipidemia, unspecified: Secondary | ICD-10-CM

## 2016-02-01 LAB — GLUCOSE, CAPILLARY
GLUCOSE-CAPILLARY: 133 mg/dL — AB (ref 65–99)
GLUCOSE-CAPILLARY: 121 mg/dL — AB (ref 65–99)
Glucose-Capillary: 127 mg/dL — ABNORMAL HIGH (ref 65–99)
Glucose-Capillary: 123 mg/dL — ABNORMAL HIGH (ref 65–99)

## 2016-02-01 LAB — BASIC METABOLIC PANEL
ANION GAP: 6 (ref 5–15)
BUN: 12 mg/dL (ref 6–20)
CALCIUM: 9 mg/dL (ref 8.9–10.3)
CO2: 24 mmol/L (ref 22–32)
Chloride: 110 mmol/L (ref 101–111)
Creatinine, Ser: 0.81 mg/dL (ref 0.44–1.00)
GLUCOSE: 141 mg/dL — AB (ref 65–99)
POTASSIUM: 4.1 mmol/L (ref 3.5–5.1)
SODIUM: 140 mmol/L (ref 135–145)

## 2016-02-01 LAB — CBC
HEMATOCRIT: 27.5 % — AB (ref 36.0–46.0)
HEMOGLOBIN: 8.2 g/dL — AB (ref 12.0–15.0)
MCH: 24.8 pg — AB (ref 26.0–34.0)
MCHC: 29.8 g/dL — ABNORMAL LOW (ref 30.0–36.0)
MCV: 83.3 fL (ref 78.0–100.0)
PLATELETS: 340 10*3/uL (ref 150–400)
RBC: 3.3 MIL/uL — AB (ref 3.87–5.11)
RDW: 17.2 % — ABNORMAL HIGH (ref 11.5–15.5)
WBC: 6.9 10*3/uL (ref 4.0–10.5)

## 2016-02-01 MED ORDER — FUROSEMIDE 20 MG PO TABS
20.0000 mg | ORAL_TABLET | Freq: Every day | ORAL | Status: DC
  Administered 2016-02-01 – 2016-02-02 (×2): 20 mg via ORAL
  Filled 2016-02-01 (×2): qty 1

## 2016-02-01 NOTE — Progress Notes (Signed)
PROGRESS NOTE  [name]  [id number] DOB: 1925/06/03  DOA: 01/11/2016   PCP: [name], MD   Brief Narrative:  82 year old female patient with a PMH of asthma, CAD, chronic diastolic CHF, DM, hypothyroid, GERD, HTN, presented to [facility] ED on 01/11/16 with 3 - 4 day history of dyspnea and productive cough, failed outpatient azithromycin, admitted with diagnosis of sepsis secondary to LLL CAP. Completed treatment for pneumonia. Persistent PSBO despite conservative management for several days. Status post diagnostic laparoscopy converted to exploratory laparotomy, lysis of adhesions 01/19/16 and transferred to stepdown unit postoperatively. Ongoing postop ileus with subsequent loose stools.   Assessment & Plan: Sepsis due to Right lower lobe community-acquired pneumonia, unknown pathogen  - Met sepsis criteria on admission. - Started empirically on Rocephin and azithromycin. Initially admitted to stepdown unit. - Blood cultures 2: Negative. Urine culture times one:  - Has completed 8 days of IV antibiotics and antibiotics have been discontinued 5/31 - Sepsis resolved. Pt remains afebrile and with WBC WNL  Partial Small bowel obstruction, S/P diagnostic laparoscopy converted to exploratory laparotomy, lysis of adhesions 01/19/16  - s/p laparotomy and LOA on 01/19/16, post op day #3 - advanced diet, pt tolerating diet well - no loose stools this AM  Loose stools  - C. Diff negative, stool panel negative, d/c contact precautions  - Discussed with Dr. Penelope Coop GI specialist - he thinks this is due to recent ABX use - continue florastor and imodium as needed  - would not recommend intervention such as colonoscopy given pt's age and recent surgery  - appear to be resolved at this point, pt denies any loose stools so far this AM  Acute kidney injury  - Presented with creatinine of 2.49. - resolved with IVF  - BMP In AM  Acute respiratory failure with hypoxia - Secondary to community-acquired  pneumonia. Resolved.  - Continue to provide pulmonary toilet, incentive spirometry and mobilization encouraged  - overall better, encouraged PT  - provided education on use of IS while awake to improve lung function  - OOB to chair as well   Acute blood loss anemia - no signs of bleeding, stable Hg in the past 48 hours  - transfuse if Hg < 7 - CBC in AM  Type II DM - Good inpatient control. - continue SSI   Chronic diastolic CHF - weight is also up from admission from 188 lbs --> 194 lbs --> 188 lbs --> 187 lbs --> 183 --> 187 lbs  - will continue to monitor daily weights, strict I/O  - resume home regimen with Lasix 20 mg PO QD  Hypothyroid - Continue Synthroid . Continue to provide meds via IV while until pt able to tolerate PO  Essential hypertension - reasonable inpatient control   Hyperlipidemia - Statins to be continued    GERD - PPI IV  Anxiety & depression  - Continue home medications  - stable this AM  CAD - plavix restarted 6/10  - Cardiology had seen for preop clearance.  Hypokalemia - Resolved.  - BMP in AM  Acute urinary retention - Foley catheter had been removed postop on 6/3 but had to be replaced on 6/4 due to recurrent urinary retention.  - foley taken out 6/12 again and pt with persistent urinary retention - improving   Asthma - Stable respiratory status, maintaining oxygen saturation at target range   Chronic LBBB, sinus tachycardia - Asymptomatic sinus tachycardia in the 110s.  - continue Metoprolol as needed  Morbid obesity due to excess calories  - pt meets criteria for morbid obesity with BMI > 35 and underlying risk factors of HTN, HLD, DM - Body mass index is 35.39 kg/(m^2).   DVT prophylaxis: SCDs, subcutaneous heparin Code Status: Full Family Communication: Discussed with patient and son at bedside. Disposition Plan: DC to SNF in AM after staples removed    Consultants:   General surgery  Cardiology.  Procedures:     NG tube  Foley catheter-DC 6/3. In and out cath when necessary  Right upper arm PICC 01/19/16 >  Status post diagnostic laparoscopy converted to exploratory laparotomy, lysis of adhesions 01/19/16   Antimicrobials:   IV Rocephin 5/24 > 5/31   Azithromycin  5/24 > 5/31    Subjective: Reports feeling better, no loose stools reported this AM.  Objective:  Filed Vitals:   01/31/16 1225 01/31/16 1947 01/31/16 2159 02/01/16 0553  BP: 131/57 141/63  138/50  Pulse: 108 111  99  Temp: 98.7 F (37.1 C) 99.2 F (37.3 C)  98.4 F (36.9 C)  TempSrc: Oral Oral  Oral  Resp: _0 Height:      Weight:    84.913 kg (187 lb 3.2 oz)  SpO2: 96% 98% 96% 98%    Intake/Output Summary (Last 24 hours) at 02/01/16 0637 Last data filed at 01/31/16 2100  Gross per 24 hour  Intake    840 ml  Output      0 ml  Net    840 ml   Filed Weights   01/30/16 0433 01/31/16 0429 02/01/16 0553  Weight: 82.503 kg (181 lb 14.2 oz) 83.28 kg (183 lb 9.6 oz) 84.913 kg (187 lb 3.2 oz)    Examination:  General exam: Small built and frail pleasant elderly female lying comfortably in bed Respiratory system: diminished breath sounds at bases, no wheezing  Cardiovascular system: S1 & S2 heard, tachy. No JVD, murmurs, rubs, gallops or clicks. Gastrointestinal system:  laparotomy site clean and dry, epigastric area non tender, good bowel sounds  Central nervous system: Alert and oriented. No focal neurological deficits.  Data Reviewed: I have personally reviewed following labs and imaging studies  CBC:  Recent Labs Lab 01/28/16 0500 01/29/16 0607 01/30/16 0505 01/31/16 0500 02/01/16 0423  WBC 4.4 4.4 4.7 4.5 6.9  NEUTROABS  --   --  2.4  --   --   HGB 7.7* 7.7* 8.3* 7.8* 8.2*  HCT 25.3* 26.0* 28.1* 26.3* 27.5*  MCV 82.7 82.3 84.4 83.2 83.3  PLT 266 254 297 291 453   Basic Metabolic Panel:  Recent Labs Lab 01/26/16 0358 01/27/16 0354 01/28/16 0500 01/29/16 0607 01/30/16 0505  01/31/16 0500 02/01/16 0423  NA 137 140 140 140 142 140 140  K 4.3 4.2 4.0 4.1 4.0 4.0 4.1  CL 104 111 108 109 112* 109 110  CO2 _1 GLUCOSE 129* 112* 111* 122* 125* 130* 141*  BUN 21* _2 CREATININE 0.81 0.81 0.81 0.77 0.69 0.80 0.81  CALCIUM 8.7* 8.4* 8.9 8.8* 9.0 8.8* 9.0  MG 2.0 1.9  --   --   --   --   --   PHOS 4.7*  --   --   --   --   --   --    Liver Function Tests:  Recent Labs Lab 01/26/16 0358 01/27/16 0354  AST 77* 50*  ALT 60* 52  ALKPHOS 56  60  BILITOT 0.4 0.5  PROT 5.3* 5.1*  ALBUMIN 2.1* 2.0*   CBG:  Recent Labs Lab 01/30/16 2119 01/31/16 0603 01/31/16 1120 01/31/16 1647 01/31/16 2052  GLUCAP 137* 124* 132* 89 118*    Recent Results (from the past 240 hour(s))  Surgical pcr screen     Status: None   Collection Time: 01/19/16 11:59 AM  Result Value Ref Range Status   MRSA, PCR NEGATIVE NEGATIVE Final   Staphylococcus aureus NEGATIVE NEGATIVE Final    Radiology Studies: No results found.  Scheduled Meds: . acetaminophen  650 mg Oral Q6H  . antiseptic oral rinse  7 mL Mouth Rinse BID  . arformoterol  15 mcg Nebulization BID   And  . umeclidinium bromide  1 puff Inhalation Daily  . azelastine  1 spray Each Nare BID  . clopidogrel  75 mg Oral QPM  . clorazepate  15 mg Oral QHS  . ezetimibe  10 mg Oral Daily  . feeding supplement (GLUCERNA SHAKE)  237 mL Oral TID BM  . ferrous sulfate  325 mg Oral QPC supper  . heparin subcutaneous  5,000 Units Subcutaneous Q8H  . insulin aspart  0-15 Units Subcutaneous TID WC  . levothyroxine  25 mcg Oral QAC breakfast  . loperamide  2 mg Oral Q6H  . metoprolol tartrate  25 mg Oral BID  . montelukast  10 mg Oral QHS  . multivitamins with iron  1 tablet Oral QPC lunch  . QUEtiapine  50 mg Oral Q breakfast  . rosuvastatin  20 mg Oral QHS  . saccharomyces boulardii  250 mg Oral BID  . sodium chloride flush  10-40 mL Intracatheter Q12H  . sodium chloride flush  3 mL  Intravenous Q12H  . tamsulosin  0.4 mg Oral QPC breakfast  . topiramate  25 mg Oral QHS   Continuous Infusions:     LOS: 21 days   Time spent: 20 minutes.  Faye Ramsay, MD [facility] Pager [phone]  If 7PM-7AM, please contact night-coverage [email or web address] Password [facility] 02/01/2016, 6:37 AM

## 2016-02-01 NOTE — Progress Notes (Signed)
CAdvance diet CCS/Braileigh Landenberger Progress Note 13 Days Post-Op  Subjective: No diarrhea last night or this AM.  Incontinent of urine.  Ate breakfast well.  Objective: Vital signs in last 24 hours: Temp:  [98.4 F (36.9 C)-99.2 F (37.3 C)] 98.4 F (36.9 C) (06/14 0553) Pulse Rate:  [99-111] 99 (06/14 0553) Resp:  [18] 18 (06/14 0553) BP: (131-141)/(50-63) 138/50 mmHg (06/14 0553) SpO2:  [96 %-98 %] 98 % (06/14 0553) Weight:  [84.913 kg (187 lb 3.2 oz)] 84.913 kg (187 lb 3.2 oz) (06/14 0553) Last BM Date: 01/31/16  Intake/Output from previous day: 06/13 0701 - 06/14 0700 In: 1200 [P.O.:1200] Out: -  Intake/Output this shift: Total I/O In: 120 [P.O.:120] Out: -   General: No concerns  Lungs: Clear  Abd: Soft, good bowel sounds.  Staples can be removed.  Extremities: No changes  Neuro: Intact  Lab Results:  @LABLAST2 (wbc:2,hgb:2,hct:2,plt:2) BMET ) Recent Labs  01/31/16 0500 02/01/16 0423  NA 140 140  K 4.0 4.1  CL 109 110  CO2 24 24  GLUCOSE 130* 141*  BUN 14 12  CREATININE 0.80 0.81  CALCIUM 8.8* 9.0   PT/INR No results for input(s): LABPROT, INR in the last 72 hours. ABG No results for input(s): PHART, HCO3 in the last 72 hours.  Invalid input(s): PCO2, PO2  Studies/Results: No results found.  Anti-infectives: Anti-infectives    Start     Dose/Rate Route Frequency Ordered Stop   01/19/16 1313  cefoTEtan in Dextrose 5% (CEFOTAN) 2-2.08 GM-% IVPB    Comments:  Sammuel Cooper   : cabinet override      01/19/16 1313 01/20/16 0129   01/19/16 1230  cefoTEtan (CEFOTAN) 2 g in dextrose 5 % 50 mL IVPB     2 g 100 mL/hr over 30 Minutes Intravenous To Surgery 01/19/16 1223 01/19/16 1422   01/12/16 0900  cefTRIAXone (ROCEPHIN) 1 g in dextrose 5 % 50 mL IVPB     1 g 100 mL/hr over 30 Minutes Intravenous Every 24 hours 01/11/16 1251 01/18/16 1018   01/12/16 0900  azithromycin (ZITHROMAX) 500 mg in dextrose 5 % 250 mL IVPB     500 mg 250 mL/hr over 60 Minutes  Intravenous Every 24 hours 01/11/16 1251 01/18/16 1048   01/11/16 0830  azithromycin (ZITHROMAX) 500 mg in dextrose 5 % 250 mL IVPB     500 mg 250 mL/hr over 60 Minutes Intravenous  Once 01/11/16 0820 01/11/16 0935   01/11/16 0830  cefTRIAXone (ROCEPHIN) 1 g in dextrose 5 % 50 mL IVPB     1 g 100 mL/hr over 30 Minutes Intravenous  Once 01/11/16 0820 01/11/16 0947      Assessment/Plan: s/p Procedure(s): LAPAROSCOPY DIAGNOSTIC LYSIS OF ADHESIRemove staplesAdvance diet Remove staples  LOS: 21 days   Kathryne Eriksson. Dahlia Bailiff, MD, FACS [phone] [facility] Surgery 02/01/2016

## 2016-02-02 DIAGNOSIS — I10 Essential (primary) hypertension: Secondary | ICD-10-CM | POA: Diagnosis not present

## 2016-02-02 DIAGNOSIS — F419 Anxiety disorder, unspecified: Secondary | ICD-10-CM | POA: Diagnosis not present

## 2016-02-02 DIAGNOSIS — R1310 Dysphagia, unspecified: Secondary | ICD-10-CM | POA: Diagnosis not present

## 2016-02-02 DIAGNOSIS — I25119 Atherosclerotic heart disease of native coronary artery with unspecified angina pectoris: Secondary | ICD-10-CM | POA: Diagnosis not present

## 2016-02-02 DIAGNOSIS — R05 Cough: Secondary | ICD-10-CM | POA: Diagnosis not present

## 2016-02-02 DIAGNOSIS — R2681 Unsteadiness on feet: Secondary | ICD-10-CM | POA: Diagnosis not present

## 2016-02-02 DIAGNOSIS — N179 Acute kidney failure, unspecified: Secondary | ICD-10-CM | POA: Diagnosis not present

## 2016-02-02 DIAGNOSIS — E119 Type 2 diabetes mellitus without complications: Secondary | ICD-10-CM | POA: Diagnosis not present

## 2016-02-02 DIAGNOSIS — J45909 Unspecified asthma, uncomplicated: Secondary | ICD-10-CM | POA: Diagnosis not present

## 2016-02-02 DIAGNOSIS — E785 Hyperlipidemia, unspecified: Secondary | ICD-10-CM | POA: Diagnosis not present

## 2016-02-02 DIAGNOSIS — A419 Sepsis, unspecified organism: Secondary | ICD-10-CM | POA: Diagnosis not present

## 2016-02-02 DIAGNOSIS — G250 Essential tremor: Secondary | ICD-10-CM | POA: Diagnosis not present

## 2016-02-02 DIAGNOSIS — J9621 Acute and chronic respiratory failure with hypoxia: Secondary | ICD-10-CM | POA: Diagnosis not present

## 2016-02-02 DIAGNOSIS — K219 Gastro-esophageal reflux disease without esophagitis: Secondary | ICD-10-CM | POA: Diagnosis not present

## 2016-02-02 DIAGNOSIS — I5032 Chronic diastolic (congestive) heart failure: Secondary | ICD-10-CM | POA: Diagnosis not present

## 2016-02-02 DIAGNOSIS — E039 Hypothyroidism, unspecified: Secondary | ICD-10-CM | POA: Diagnosis not present

## 2016-02-02 DIAGNOSIS — D62 Acute posthemorrhagic anemia: Secondary | ICD-10-CM | POA: Diagnosis not present

## 2016-02-02 DIAGNOSIS — J4531 Mild persistent asthma with (acute) exacerbation: Secondary | ICD-10-CM | POA: Diagnosis not present

## 2016-02-02 DIAGNOSIS — M6281 Muscle weakness (generalized): Secondary | ICD-10-CM | POA: Diagnosis not present

## 2016-02-02 DIAGNOSIS — J189 Pneumonia, unspecified organism: Secondary | ICD-10-CM | POA: Diagnosis not present

## 2016-02-02 DIAGNOSIS — R339 Retention of urine, unspecified: Secondary | ICD-10-CM | POA: Diagnosis not present

## 2016-02-02 DIAGNOSIS — K56609 Unspecified intestinal obstruction, unspecified as to partial versus complete obstruction: Secondary | ICD-10-CM | POA: Insufficient documentation

## 2016-02-02 DIAGNOSIS — R262 Difficulty in walking, not elsewhere classified: Secondary | ICD-10-CM | POA: Diagnosis not present

## 2016-02-02 DIAGNOSIS — Z5189 Encounter for other specified aftercare: Secondary | ICD-10-CM | POA: Diagnosis not present

## 2016-02-02 DIAGNOSIS — I2510 Atherosclerotic heart disease of native coronary artery without angina pectoris: Secondary | ICD-10-CM | POA: Diagnosis not present

## 2016-02-02 DIAGNOSIS — R6520 Severe sepsis without septic shock: Secondary | ICD-10-CM | POA: Diagnosis not present

## 2016-02-02 DIAGNOSIS — J441 Chronic obstructive pulmonary disease with (acute) exacerbation: Secondary | ICD-10-CM | POA: Diagnosis not present

## 2016-02-02 DIAGNOSIS — K5669 Other intestinal obstruction: Secondary | ICD-10-CM | POA: Diagnosis not present

## 2016-02-02 DIAGNOSIS — M25511 Pain in right shoulder: Secondary | ICD-10-CM | POA: Diagnosis not present

## 2016-02-02 DIAGNOSIS — M25561 Pain in right knee: Secondary | ICD-10-CM | POA: Diagnosis not present

## 2016-02-02 DIAGNOSIS — M25562 Pain in left knee: Secondary | ICD-10-CM | POA: Diagnosis not present

## 2016-02-02 DIAGNOSIS — R062 Wheezing: Secondary | ICD-10-CM | POA: Diagnosis not present

## 2016-02-02 DIAGNOSIS — R42 Dizziness and giddiness: Secondary | ICD-10-CM | POA: Diagnosis not present

## 2016-02-02 DIAGNOSIS — R5381 Other malaise: Secondary | ICD-10-CM | POA: Diagnosis not present

## 2016-02-02 LAB — GLUCOSE, CAPILLARY
GLUCOSE-CAPILLARY: 123 mg/dL — AB (ref 65–99)
Glucose-Capillary: 139 mg/dL — ABNORMAL HIGH (ref 65–99)

## 2016-02-02 LAB — CBC
HCT: 27.8 % — ABNORMAL LOW (ref 36.0–46.0)
Hemoglobin: 8.4 g/dL — ABNORMAL LOW (ref 12.0–15.0)
MCH: 25.1 pg — ABNORMAL LOW (ref 26.0–34.0)
MCHC: 30.2 g/dL (ref 30.0–36.0)
MCV: 83 fL (ref 78.0–100.0)
PLATELETS: 351 10*3/uL (ref 150–400)
RBC: 3.35 MIL/uL — AB (ref 3.87–5.11)
RDW: 17.4 % — AB (ref 11.5–15.5)
WBC: 5.7 10*3/uL (ref 4.0–10.5)

## 2016-02-02 LAB — BASIC METABOLIC PANEL
ANION GAP: 8 (ref 5–15)
BUN: 14 mg/dL (ref 6–20)
CALCIUM: 9.1 mg/dL (ref 8.9–10.3)
CO2: 25 mmol/L (ref 22–32)
Chloride: 107 mmol/L (ref 101–111)
Creatinine, Ser: 0.76 mg/dL (ref 0.44–1.00)
GLUCOSE: 118 mg/dL — AB (ref 65–99)
POTASSIUM: 4.2 mmol/L (ref 3.5–5.1)
SODIUM: 140 mmol/L (ref 135–145)

## 2016-02-02 MED ORDER — FERROUS SULFATE 325 (65 FE) MG PO TABS: 325.0000 mg | ORAL_TABLET | Freq: Every day | ORAL | Status: DC

## 2016-02-02 MED ORDER — OXYCODONE HCL 5 MG PO TABS: 5.0000 mg | ORAL_TABLET | ORAL | Status: DC | PRN

## 2016-02-02 MED ORDER — LORAZEPAM 1 MG PO TABS
0.5000 mg | ORAL_TABLET | Freq: Three times a day (TID) | ORAL | Status: DC | PRN
Start: 2016-02-02 — End: 2016-02-03

## 2016-02-02 MED ORDER — TAMSULOSIN HCL 0.4 MG PO CAPS: 0.4000 mg | ORAL_CAPSULE | Freq: Every day | ORAL | Status: DC

## 2016-02-02 MED ORDER — METOPROLOL TARTRATE 25 MG PO TABS
25.0000 mg | ORAL_TABLET | Freq: Two times a day (BID) | ORAL | Status: DC
Start: 2016-02-02 — End: 2016-06-15

## 2016-02-02 MED ORDER — SACCHAROMYCES BOULARDII 250 MG PO CAPS: 250.0000 mg | ORAL_CAPSULE | Freq: Two times a day (BID) | ORAL | Status: DC

## 2016-02-02 NOTE — Clinical Social Work Placement (Signed)
   CLINICAL SOCIAL WORK PLACEMENT  NOTE  Date:  02/02/2016  Patient Details  Name: KEYONTE YANNOTTI MRN: [id number] Date of Birth: 07-12-1925  Clinical Social Work is seeking post-discharge placement for this patient at the [place] level of care (*CSW will initial, date and re-position this form in  chart as items are completed):      Patient/family provided with [place] Work Department's list of facilities offering this level of care within the geographic area requested by the patient (or if unable, by the patient's family).      Patient/family informed of their freedom to choose among providers that offer the needed level of care, that participate in Medicare, Medicaid or managed care program needed by the patient, have an available bed and are willing to accept the patient.      Patient/family informed of [facility]'s ownership interest in [facility] and [facility], as well as of the fact that they are under no obligation to receive care at these facilities.  PASRR submitted to EDS on       PASRR number received on       Existing PASRR number confirmed on 01/13/16     FL2 transmitted to all facilities in geographic area requested by pt/family on 01/13/16     FL2 transmitted to all facilities within larger geographic area on       Patient informed that his/her managed care company has contracts with or will negotiate with certain facilities, including the following:        Yes   Patient/family informed of bed offers received.  Patient chooses bed at [facility]     Physician recommends and patient chooses bed at      Patient to be transferred to [facility] on 02/02/16.  Patient to be transferred to facility by [facility]     Patient family notified on 02/02/16 of transfer.  Name of family member notified:  Richard Haroon     PHYSICIAN Please prepare prescriptions     Additional Comment:     _______________________________________________ Candie Chroman, LCSW 02/02/2016, 11:14 AM

## 2016-02-02 NOTE — Care Management Important Message (Signed)
Important Message  Patient Details  Name: [name] MRN: [id number] Date of Birth: 08-09-1925   Medicare Important Message Given:  Yes    Loann Quill 02/02/2016, 9:50 AM

## 2016-02-02 NOTE — Discharge Summary (Addendum)
Physician Discharge Summary  Baylor Teegarden Stettler MBW:[id number] DOB: Jul 05, 1925 DOA: 01/11/2016  PCP: [name], MD  Admit date: 01/11/2016 Discharge date: 02/02/2016  Recommendations for Outpatient Follow-up:  1. Pt will need to follow up with PCP in 1-2 weeks post discharge 2. Please obtain BMP to evaluate electrolytes and kidney function 3. Please also check CBC to evaluate Hg and Hct levels 4. Please note that pt had some loose stools post op and post ABX use, this has resolved but if needed she can take Imodium 5. Please also note that staples were taken out prior to discharge  6. Pt made aware to monitor her weight closely, she is on Lasix and any weight gain over 3 lbs in 24 hours should be reported to provider  7. Weights on discharge 179 lbs 8. Please note metoprolol change form XL QD dosing to regular BID dosing    Discharge Diagnoses:  Principal Problem:   Sepsis due to pneumonia [facility]) Active Problems:   Diabetes mellitus type 2, controlled ([place])  Discharge Condition: Stable  Diet recommendation: Heart healthy diet discussed in details   Brief Narrative:  82 year old female patient with a PMH of asthma, CAD, chronic diastolic CHF, DM, hypothyroid, GERD, HTN, presented to [facility] ED on 01/11/16 with 3 - 4 day history of dyspnea and productive cough, failed outpatient azithromycin, admitted with diagnosis of sepsis secondary to LLL CAP. Completed treatment for pneumonia. Persistent PSBO despite conservative management for several days. Status post diagnostic laparoscopy converted to exploratory laparotomy, lysis of adhesions 01/19/16 and transferred to stepdown unit postoperatively. Ongoing postop ileus with subsequent loose stools.   Assessment & Plan: Sepsis due to Right lower lobe community-acquired pneumonia, unknown pathogen, chronic bronchitis  - Met sepsis criteria on admission. - Started empirically on Rocephin and azithromycin. Initially admitted to stepdown unit. - Blood  cultures 2: Negative. Urine culture times one:  - Has completed 8 days of IV antibiotics and antibiotics have been discontinued 5/31 - Sepsis resolved. Pt remains afebrile and with WBC WNL  Partial Small bowel obstruction, S/P diagnostic laparoscopy converted to exploratory laparotomy, lysis of adhesions 01/19/16  - s/p laparotomy and LOA on 01/19/16, post op day #14 - advanced diet, pt tolerating diet well - no loose stools this AM  Loose stools  - C. Diff negative, stool panel negative, d/c contact precautions  - Discussed with Dr. Penelope Coop GI specialist - he thinks this is due to recent ABX use - continue florastor and imodium as needed  - would not recommend intervention such as colonoscopy given pt's age and recent surgery  - appear to be resolved at this point, pt denies any loose stools so far this AM  Acute kidney injury  - Presented with creatinine of 2.49. - resolved with IVF  - BMP In AM  Tachycardia related to volume depletion in the setting of diarrhea and poor oral intake - resolved   Acute respiratory failure with hypoxia - Secondary to community-acquired pneumonia, imposed on chronic bronchitis, Resolved.  - Continue to provide pulmonary toilet, incentive spirometry and mobilization encouraged  - overall better, encouraged PT  - provided education on use of IS while awake to improve lung function  - OOB to chair as well   Acute blood loss anemia, multifactorial and secondary to post op blood loss with anemia of IDA - review of records indicate baseline Hg ~9  - no signs of bleeding, stable Hg in the past 48 hours   Type II DM - Good inpatient  control.  Chronic diastolic CHF - weight is also up from admission from 188 lbs --> 194 lbs --> 188 lbs --> 187 lbs --> 183 --> 187 --> 179 lbs  - will continue to monitor daily weights, strict I/O  - resume home regimen with Lasix 20 mg PO QD  Hypothyroid - Continue Synthroid . Continue to provide meds via IV  while until pt able to tolerate PO  Essential hypertension - reasonable inpatient control   Hyperlipidemia - Statins to be continued   Anxiety & depression  - Continue home medications  - stable this AM  CAD - plavix restarted 6/10  - Cardiology had seen for preop clearance.  Hypokalemia - Resolved.   Acute urinary retention likely post op related and poor post op mobility, no evidence of UTI - Foley catheter had been removed postop on 6/3 but had to be replaced on 6/4 due to recurrent urinary retention.  - foley taken out 6/12 again and pt has actually improved with better urine output   Asthma - Stable respiratory status, maintaining oxygen saturation at target range   Chronic LBBB, sinus tachycardia - Asymptomatic sinus tachycardia in the 110s.  - continue Metoprolol as needed   Morbid obesity due to excess calories  - pt meets criteria for morbid obesity with BMI > 35 and underlying risk factors of HTN, HLD, DM - Body mass index is 35.39 kg/(m^2).  Moderate PCM - appreciate nutritionist team assistance   DVT prophylaxis: SCDs, subcutaneous heparin Code Status: Full Family Communication: Discussed with patient and son at bedside. Disposition Plan: DC to SNF    Consultants:   General surgery  Cardiology.  Procedures:   NG tube  Foley catheter-DC 6/3. In and out cath when necessary  Right upper arm PICC 01/19/16 >  Status post diagnostic laparoscopy converted to exploratory laparotomy, lysis of adhesions 01/19/16  Antimicrobials:   IV Rocephin 5/24 > 5/31   Azithromycin 5/24 > 5/31  Discharge Exam: Filed Vitals:   02/01/16 2059 02/02/16 0523  BP:  103/67  Pulse: 100 87  Temp:  98.3 F (36.8 C)  Resp: 18 18   Filed Vitals:   02/01/16 2057 02/01/16 2059 02/02/16 0523 02/02/16 0804  BP: 159/73  103/67   Pulse: 108 100 87   Temp: 98.4 F (36.9 C)  98.3 F (36.8 C)   TempSrc: Oral  Oral   Resp: 18 18 18    Height:      Weight:    81.285 kg (179 lb 3.2 oz)   SpO2: 98%  97% 97%    General: Pt is alert, follows commands appropriately, not in acute distress Cardiovascular: Regular rate and rhythm, S1/S2 +, no rubs, no gallops Respiratory: Clear to auscultation bilaterally, no wheezing, diminished breath sounds at bases  Abdominal: Soft, non tender, non distended, bowel sounds +, no guarding Extremities: no cyanosis, pulses palpable bilaterally DP and PT Neuro: Grossly nonfocal  Discharge Instructions  Discharge Instructions    Diet - low sodium heart healthy    Complete by:  As directed      Increase activity slowly    Complete by:  As directed             Medication List    STOP taking these medications        guaiFENesin 600 MG 12 hr tablet  Commonly known as:  MUCINEX     ibuprofen 200 MG tablet  Commonly known as:  ADVIL,MOTRIN     metoprolol succinate 50  MG 24 hr tablet  Commonly known as:  TOPROL-XL     traMADol 50 MG tablet  Commonly known as:  ULTRAM      TAKE these medications        acetaminophen-codeine 300-30 MG tablet  Commonly known as:  TYLENOL #3  Take 1 tablet by mouth every 4 (four) hours as needed for moderate pain.     albuterol 108 (90 Base) MCG/ACT inhaler  Commonly known as:  PROVENTIL HFA;VENTOLIN HFA  Inhale 2 puffs into the lungs every 6 (six) hours as needed for wheezing or shortness of breath.     albuterol (2.5 MG/3ML) 0.083% nebulizer solution  Commonly known as:  PROVENTIL  Take 3 mLs (2.5 mg total) by nebulization every 6 (six) hours as needed for wheezing.     aspirin EC 81 MG tablet  Take 81 mg by mouth at bedtime.     azelastine 0.1 % nasal spray  Commonly known as:  ASTELIN  Place 1 spray into the nose 2 (two) times daily. Use in each nostril as directed     benzonatate 200 MG capsule  Commonly known as:  TESSALON  TAKE ONE CAPSULE BY MOUTH 3 TIMES A DAY AS NEEDED FOR COUGH     clopidogrel 75 MG tablet  Commonly known as:  PLAVIX  Take 75 mg by  mouth every evening.     clorazepate 15 MG tablet  Commonly known as:  TRANXENE  Take 15 mg by mouth at bedtime. anxiety     ferrous sulfate 325 (65 FE) MG tablet  Take 1 tablet (325 mg total) by mouth daily after supper.     furosemide 40 MG tablet  Commonly known as:  LASIX  Take 20 mg by mouth every other day.     glimepiride 1 MG tablet  Commonly known as:  AMARYL  Take 1 mg by mouth daily before breakfast.     levothyroxine 50 MCG tablet  Commonly known as:  SYNTHROID, LEVOTHROID  Take 25 mcg by mouth daily.     loperamide 2 MG tablet  Commonly known as:  IMODIUM A-D  Take 2 mg by mouth as needed for diarrhea or loose stools.     LORazepam 1 MG tablet  Commonly known as:  ATIVAN  Take 0.5-1 tablets (0.5-1 mg total) by mouth every 8 (eight) hours as needed for anxiety.     metoprolol tartrate 25 MG tablet  Commonly known as:  LOPRESSOR  Take 1 tablet (25 mg total) by mouth 2 (two) times daily.     montelukast 10 MG tablet  Commonly known as:  SINGULAIR  Take 10 mg by mouth at bedtime.     multivitamin with minerals Tabs tablet  Take 1 tablet by mouth daily.     nitroGLYCERIN 0.4 MG SL tablet  Commonly known as:  NITROSTAT  Place 0.4 mg under the tongue every 5 (five) minutes as needed. For chest pain     omeprazole 20 MG capsule  Commonly known as:  PRILOSEC  Take 20-40 mg by mouth daily as needed (acid reflux).     oxyCODONE 5 MG immediate release tablet  Commonly known as:  Oxy IR/ROXICODONE  Take 1-1.5 tablets (5-7.5 mg total) by mouth every 4 (four) hours as needed for moderate pain, severe pain or breakthrough pain.     QUEtiapine 25 MG tablet  Commonly known as:  SEROQUEL  Take 50 mg by mouth daily with breakfast.     rosuvastatin 20 MG  tablet  Commonly known as:  CRESTOR  Take 20 mg by mouth at bedtime.     saccharomyces boulardii 250 MG capsule  Commonly known as:  FLORASTOR  Take 1 capsule (250 mg total) by mouth 2 (two) times daily.      tamsulosin 0.4 MG Caps capsule  Commonly known as:  FLOMAX  Take 1 capsule (0.4 mg total) by mouth daily after breakfast.     topiramate 25 MG tablet  Commonly known as:  TOPAMAX  Take 25 mg by mouth at bedtime.     umeclidinium-vilanterol 62.5-25 MCG/INH Aepb  Commonly known as:  ANORO ELLIPTA  Inhale 1 puff into the lungs daily.     ZETIA 10 MG tablet  Generic drug:  ezetimibe  Take 10 mg by mouth daily.            Follow-up Information    Follow up with [facility].   Specialty:  Skilled Nursing Facility   Contact information:   [place] [phone]      Follow up with [name], MD.   Specialty:  Internal Medicine   Contact information:   [address] [address] [phone]       Call Faye Ramsay, MD.   Specialty:  Internal Medicine   Why:  As needed call my cell phone [phone]   Contact information:   [address] [place] [address] [phone]        The results of significant diagnostics from this hospitalization (including imaging, microbiology, ancillary and laboratory) are listed below for reference.     Microbiology: Recent Results (from the past 240 hour(s))  C difficile quick scan w PCR reflex     Status: None   Collection Time: 01/25/16  5:28 PM  Result Value Ref Range Status   C Diff antigen NEGATIVE NEGATIVE Final   C Diff toxin NEGATIVE NEGATIVE Final   C Diff interpretation Negative for toxigenic C. difficile  Final  Gastrointestinal Panel by PCR , Stool     Status: None   Collection Time: 01/27/16  1:38 PM  Result Value Ref Range Status   Campylobacter species NOT DETECTED NOT DETECTED Final   Plesimonas shigelloides NOT DETECTED NOT DETECTED Final   Salmonella species NOT DETECTED NOT DETECTED Final   Yersinia enterocolitica NOT DETECTED NOT DETECTED Final   Vibrio species NOT DETECTED NOT DETECTED Final   Vibrio  cholerae NOT DETECTED NOT DETECTED Final   Enteroaggregative E coli (EAEC) NOT DETECTED NOT DETECTED Final   Enteropathogenic E coli (EPEC) NOT DETECTED NOT DETECTED Final   Enterotoxigenic E coli (ETEC) NOT DETECTED NOT DETECTED Final   Shiga like toxin producing E coli (STEC) NOT DETECTED NOT DETECTED Final   E. coli O157 NOT DETECTED NOT DETECTED Final   Shigella/Enteroinvasive E coli (EIEC) NOT DETECTED NOT DETECTED Final   Cryptosporidium NOT DETECTED NOT DETECTED Final   Cyclospora cayetanensis NOT DETECTED NOT DETECTED Final   Entamoeba histolytica NOT DETECTED NOT DETECTED Final   Giardia lamblia NOT DETECTED NOT DETECTED Final   Adenovirus F40/41 NOT DETECTED NOT DETECTED Final   Astrovirus NOT DETECTED NOT DETECTED Final   Norovirus GI/GII NOT DETECTED NOT DETECTED Final   Rotavirus A NOT DETECTED NOT DETECTED Final   Sapovirus (I, II, IV, and V) NOT DETECTED NOT DETECTED Final     Labs: Basic Metabolic Panel:  Recent Labs Lab 01/27/16 0354  01/29/16 0607 01/30/16 0505  01/31/16 0500 02/01/16 0423 02/02/16 0447  NA 140  < > 140 142 140 140 140  K 4.2  < > 4.1 4.0 4.0 4.1 4.2  CL 111  < > 109 112* 109 110 107  CO2 25  < > 25 23 24 24 25   GLUCOSE 112*  < > 122* 125* 130* 141* 118*  BUN 17  < > 12 10 14 12 14   CREATININE 0.81  < > 0.77 0.69 0.80 0.81 0.76  CALCIUM 8.4*  < > 8.8* 9.0 8.8* 9.0 9.1  MG 1.9  --   --   --   --   --   --   < > = values in this interval not displayed. Liver Function Tests:  Recent Labs Lab 01/27/16 0354  AST 50*  ALT 52  ALKPHOS 60  BILITOT 0.5  PROT 5.1*  ALBUMIN 2.0*   CBC:  Recent Labs Lab 01/29/16 0607 01/30/16 0505 01/31/16 0500 02/01/16 0423 02/02/16 0447  WBC 4.4 4.7 4.5 6.9 5.7  NEUTROABS  --  2.4  --   --   --   HGB 7.7* 8.3* 7.8* 8.2* 8.4*  HCT 26.0* 28.1* 26.3* 27.5* 27.8*  MCV 82.3 84.4 83.2 83.3 83.0  PLT 254 297 291 340 351    BNP (last 3 results)  Recent Labs  07/15/15 1515  BNP 44.2    CBG:  Recent Labs Lab 02/01/16 0701 02/01/16 1205 02/01/16 1630 02/01/16 2156 02/02/16 0632  GLUCAP 133* 127* 121* 123* 123*   SIGNED: Time coordinating discharge:  30 minutes  MAGICK-Kamilia Carollo, MD  [facility] 02/02/2016, 9:37 AM Pager [phone]  If 7PM-7AM, please contact night-coverage [email or web address] Password [facility]   '

## 2016-02-02 NOTE — Clinical Social Work Note (Signed)
CSW facilitated patient discharge including contacting patient family and facility to confirm patient discharge plans. Clinical information faxed to facility and family agreeable with plan. CSW arranged ambulance transport via [facility] to [facility]. RN to call report prior to discharge ([phone]).  CSW will sign off for now as social work intervention is no longer needed. Please consult us again if new needs arise.  Kristee Angus, CSW [phone]   

## 2016-02-02 NOTE — Discharge Instructions (Signed)

## 2016-02-02 NOTE — Progress Notes (Signed)
Physical Therapy Treatment Patient Details Name: [name] MRN: [id number] DOB: 1925/01/31 Today's Date: 02/02/2016    History of Present Illness 82 y.o. female adm with 3-4d of SOB, also with abd distension, found to have SBO d/y adhesion;    PMHx: Asthma not on home oxygen, CAD, DM2, hypothyroidism, GERD, CAD, OA, R TKA.  Pt s/p exploratory laparotomy due to partial SBO on 01/19/16.      PT Comments    Rather deconditioned.  Will benefit well from rehab at SNF  Follow Up Recommendations  SNF     Equipment Recommendations  Other (comment)    Recommendations for Other Services       Precautions / Restrictions Precautions Precautions: Fall Precaution Comments: monitor HR Restrictions Weight Bearing Restrictions: No    Mobility  Bed Mobility Overal bed mobility: Needs Assistance Bed Mobility: Supine to Sit;Sit to Supine     Supine to sit: Min assist Sit to supine: Min assist   General bed mobility comments: also needed asssit to bridge to/from EOB and scoot up in bed  Transfers Overall transfer level: Needs assistance   Transfers: Sit to/from Stand Sit to Stand: Min assist         General transfer comment: cues for hand placement  Ambulation/Gait Ambulation/Gait assistance: Min assist Ambulation Distance (Feet): 30 Feet Assistive device: Rolling walker (2 wheeled) Gait Pattern/deviations: Decreased step length - right;Step-through pattern;Decreased step length - left;Decreased stride length Gait velocity: decreased   General Gait Details: small steps to advance, cues for posture   Stairs            Wheelchair Mobility    Modified Rankin (Stroke Patients Only)       Balance Overall balance assessment: Needs assistance Sitting-balance support: No upper extremity supported Sitting balance-Leahy Scale: Fair     Standing balance support: Bilateral upper extremity supported Standing balance-Leahy Scale: Poor                       Cognition Arousal/Alertness: Awake/alert Behavior During Therapy: WFL for tasks assessed/performed Overall Cognitive Status: Within Functional Limits for tasks assessed                      Exercises General Exercises - Upper Extremity Elbow Flexion: AROM;Both;10 reps (resisted) Elbow Extension: AROM;Both;10 reps;Supine (resisted) General Exercises - Lower Extremity Heel Slides: AROM;Strengthening;10 reps;Supine (resisted) Hip ABduction/ADduction: AROM;10 reps;Both;Supine    General Comments General comments (skin integrity, edema, etc.): SpO2 96% at 113 bpm on RA with minimal activity.      Pertinent Vitals/Pain Pain Assessment: Faces Faces Pain Scale: Hurts a little bit Pain Location: surgical site Pain Descriptors / Indicators: Sore Pain Intervention(s): Monitored during session    Home Living                      Prior Function            PT Goals (current goals can now be found in the care plan section) Acute Rehab PT Goals Patient Stated Goal: none stated PT Goal Formulation: With patient Time For Goal Achievement: 02/03/16 Potential to Achieve Goals: Good Progress towards PT goals: Progressing toward goals    Frequency  Min 2X/week    PT Plan Current plan remains appropriate    Co-evaluation             End of Session Equipment Utilized During Treatment: Gait belt Activity Tolerance: Patient tolerated treatment well;Patient limited by fatigue  Patient left: in bed     Time: 1322-1357 PT Time Calculation (min) (ACUTE ONLY): 35 min  Charges:  $Gait Training: 8-22 mins $Therapeutic Activity: 8-22 mins                    G Codes:      Estephan Gallardo, Tessie Fass 02/02/2016, 3:04 PM 02/02/2016  Donnella Sham, PT [phone]  (pager)

## 2016-02-03 ENCOUNTER — Encounter: Payer: Self-pay | Admitting: Adult Health

## 2016-02-03 ENCOUNTER — Other Ambulatory Visit: Payer: Self-pay | Admitting: *Deleted

## 2016-02-03 ENCOUNTER — Non-Acute Institutional Stay (SKILLED_NURSING_FACILITY): Payer: Medicare Other | Admitting: Adult Health

## 2016-02-03 DIAGNOSIS — I10 Essential (primary) hypertension: Secondary | ICD-10-CM | POA: Diagnosis not present

## 2016-02-03 DIAGNOSIS — F419 Anxiety disorder, unspecified: Secondary | ICD-10-CM

## 2016-02-03 DIAGNOSIS — K219 Gastro-esophageal reflux disease without esophagitis: Secondary | ICD-10-CM

## 2016-02-03 DIAGNOSIS — E119 Type 2 diabetes mellitus without complications: Secondary | ICD-10-CM | POA: Diagnosis not present

## 2016-02-03 DIAGNOSIS — J309 Allergic rhinitis, unspecified: Secondary | ICD-10-CM

## 2016-02-03 DIAGNOSIS — D62 Acute posthemorrhagic anemia: Secondary | ICD-10-CM

## 2016-02-03 DIAGNOSIS — R5381 Other malaise: Secondary | ICD-10-CM | POA: Diagnosis not present

## 2016-02-03 DIAGNOSIS — G894 Chronic pain syndrome: Secondary | ICD-10-CM

## 2016-02-03 DIAGNOSIS — J189 Pneumonia, unspecified organism: Secondary | ICD-10-CM | POA: Diagnosis not present

## 2016-02-03 DIAGNOSIS — J45909 Unspecified asthma, uncomplicated: Secondary | ICD-10-CM

## 2016-02-03 DIAGNOSIS — G250 Essential tremor: Secondary | ICD-10-CM

## 2016-02-03 DIAGNOSIS — E039 Hypothyroidism, unspecified: Secondary | ICD-10-CM

## 2016-02-03 DIAGNOSIS — I2510 Atherosclerotic heart disease of native coronary artery without angina pectoris: Secondary | ICD-10-CM | POA: Diagnosis not present

## 2016-02-03 DIAGNOSIS — E785 Hyperlipidemia, unspecified: Secondary | ICD-10-CM | POA: Diagnosis not present

## 2016-02-03 DIAGNOSIS — K56600 Partial intestinal obstruction, unspecified as to cause: Secondary | ICD-10-CM

## 2016-02-03 DIAGNOSIS — A419 Sepsis, unspecified organism: Secondary | ICD-10-CM

## 2016-02-03 DIAGNOSIS — K5669 Other intestinal obstruction: Secondary | ICD-10-CM

## 2016-02-03 DIAGNOSIS — F411 Generalized anxiety disorder: Secondary | ICD-10-CM

## 2016-02-03 DIAGNOSIS — J45901 Unspecified asthma with (acute) exacerbation: Secondary | ICD-10-CM

## 2016-02-03 MED ORDER — LORAZEPAM 0.5 MG PO TABS: ORAL_TABLET | ORAL | Status: DC

## 2016-02-03 NOTE — Telephone Encounter (Signed)
[facility] 

## 2016-02-03 NOTE — Progress Notes (Signed)
Patient ID: [name], female   DOB: [birth year], 82 y.o.   MRN: [id number]    DATE:  02/03/2016   MRN:  [id number]  BIRTHDAY: 1925-05-17  Facility:  [facility] Home Location:  [place] Room Number: 1102-B  LEVEL OF CARE:  SNF [phone])  Contact Information    Name Relation Home Work Mobile   Soria,Richard Son [phone]   Zannie Cove   [id number]   Kimbrough,Jefferer Grandaughter [phone]  [phone]       Code Status History    Date Active Date Inactive Code Status Order ID Comments User Context   01/11/2016 12:22 PM 02/02/2016  5:38 PM Full Code [id number]  Rondel Jumbo, PA-C ED   07/15/2015  6:24 PM 07/19/2015  5:08 PM Full Code [id number]  Donne Hazel, MD ED   01/18/2014  9:35 PM 01/19/2014  5:52 PM Full Code [id number]  Rise Patience, MD Inpatient       Chief Complaint  Patient presents with  . Hospitalization Follow-up    HISTORY OF PRESENT ILLNESS:  This is an 82 year old female who has been admitted to [facility] on 02/02/16 from [facility]. She has PMH of asthma, CAD, chronic diastolic CHF, DM, hypothyroid, GERD and hypertension. She was treated in the hospital for sepsis due to right lower lobe community-acquired pneumonia. She was started empirically on Rocephin and azithromycin. Blood cultures 2 negative. She has completed 8 days of IV antibiotics and sepsis has resolved. Persistent partial small bowel obstruction despite conservative management for several days for which she had diagnostic laparoscopy converted to exploratory laparotomy, lysis of adhesions 01/19/16.   She has been admitted for a short-term rehabilitation.  PAST MEDICAL HISTORY:  Past Medical History  Diagnosis Date  . Asthma   . Coronary artery disease   . Diabetes mellitus without complication ([place])   . Hypothyroidism   . GERD (gastroesophageal reflux disease)   . Insomnia   . Anxiety   . Hypertension    . CAD (coronary artery disease)   . Osteopenia   . Osteoarthritis   . H/O echocardiogram 06-26-2012    EF 55%  . H/O cardiovascular stress test 06-26-2012    compared to previous study there is no significant change; normal myocardial perfusion study. no significant ischemia demonstrated . this is a low risk scan  . History of cardiac cath 09-30-1998    cath done by Dr. Claiborne Billings, successful rotation atherectomy of the left anterior decending artert with reduction from 80 to105  . Hx of cardiac cath 09-26-1998    dignostic cath dr. Claiborne Billings to do rotoblator at a later date  . History of Doppler ultrasound 03-30-2008    neg abd. doppler for aneurysm  . History of Holter monitoring 12-22-2007    NSR with pac occs. pvc's     CURRENT MEDICATIONS: Reviewed  Patient's Medications  New Prescriptions   No medications on file  Previous Medications   ACETAMINOPHEN-CODEINE (TYLENOL #3) 300-30 MG TABLET    Take 1 tablet by mouth every 4 (four) hours as needed for moderate pain.   ALBUTEROL (PROVENTIL HFA;VENTOLIN HFA) 108 (90 BASE) MCG/ACT INHALER    Inhale 2 puffs into the lungs every 6 (six) hours as needed for wheezing or shortness of breath.   ALBUTEROL (PROVENTIL) (2.5 MG/3ML) 0.083% NEBULIZER SOLUTION    Take 3 mLs (2.5 mg total) by nebulization every 6 (six) hours as needed for wheezing.   ASPIRIN EC 81  MG TABLET    Take 81 mg by mouth at bedtime.    AZELASTINE (ASTELIN) 137 MCG/SPRAY NASAL SPRAY    Place 1 spray into the nose 2 (two) times daily. Use in each nostril as directed   BENZONATATE (TESSALON) 200 MG CAPSULE    TAKE ONE CAPSULE BY MOUTH 3 TIMES A DAY AS NEEDED FOR COUGH   CLOPIDOGREL (PLAVIX) 75 MG TABLET    Take 75 mg by mouth every evening.    CLORAZEPATE (TRANXENE) 15 MG TABLET    Take 15 mg by mouth at bedtime. anxiety   FERROUS SULFATE 325 (65 FE) MG TABLET    Take 1 tablet (325 mg total) by mouth daily after supper.   FUROSEMIDE (LASIX) 20 MG TABLET    Take 20 mg by mouth daily.    GLIMEPIRIDE (AMARYL) 1 MG TABLET    Take 1 mg by mouth daily before breakfast.   LEVOTHYROXINE (SYNTHROID, LEVOTHROID) 50 MCG TABLET    Take 25 mcg by mouth daily.    LOPERAMIDE (IMODIUM A-D) 2 MG TABLET    Take 2 mg by mouth as needed for diarrhea or loose stools.    METOPROLOL TARTRATE (LOPRESSOR) 25 MG TABLET    Take 1 tablet (25 mg total) by mouth 2 (two) times daily.   MONTELUKAST (SINGULAIR) 10 MG TABLET    Take 10 mg by mouth at bedtime.   MULTIPLE VITAMIN (MULTIVITAMIN WITH MINERALS) TABS TABLET    Take 1 tablet by mouth daily.   NITROGLYCERIN (NITROSTAT) 0.4 MG SL TABLET    Place 0.4 mg under the tongue every 5 (five) minutes as needed. For chest pain   OMEPRAZOLE (PRILOSEC) 20 MG CAPSULE    Take 20-40 mg by mouth daily as needed (acid reflux).   OXYCODONE (OXY IR/ROXICODONE) 5 MG IMMEDIATE RELEASE TABLET    Take 1-1.5 tablets (5-7.5 mg total) by mouth every 4 (four) hours as needed for moderate pain, severe pain or breakthrough pain.   QUETIAPINE (SEROQUEL) 25 MG TABLET    Take 50 mg by mouth daily with breakfast.   ROSUVASTATIN (CRESTOR) 20 MG TABLET    Take 20 mg by mouth at bedtime.    SACCHAROMYCES BOULARDII (FLORASTOR) 250 MG CAPSULE    Take 1 capsule (250 mg total) by mouth 2 (two) times daily.   TAMSULOSIN (FLOMAX) 0.4 MG CAPS CAPSULE    Take 1 capsule (0.4 mg total) by mouth daily after breakfast.   TOPIRAMATE (TOPAMAX) 25 MG TABLET    Take 25 mg by mouth at bedtime.   UMECLIDINIUM-VILANTEROL (ANORO ELLIPTA) 62.5-25 MCG/INH AEPB    Inhale 1 puff into the lungs daily.   ZETIA 10 MG TABLET    Take 10 mg by mouth daily.  Modified Medications   Modified Medication Previous Medication   LORAZEPAM (ATIVAN) 0.5 MG TABLET LORazepam (ATIVAN) 0.5 MG tablet      Take 1/2 to One tablet by mouth every 8 hours as needed for sleep    Take 0.5 mg by mouth every 8 (eight) hours as needed for anxiety.  Discontinued Medications   FUROSEMIDE (LASIX) 40 MG TABLET    Take 20 mg by mouth every other  day.   LORAZEPAM (ATIVAN) 1 MG TABLET    Take 0.5-1 tablets (0.5-1 mg total) by mouth every 8 (eight) hours as needed for anxiety.     No Known Allergies   REVIEW OF SYSTEMS:  GENERAL: no change in appetite, no fatigue, no weight changes, no fever, chills or  weakness EYES: Denies change in vision, dry eyes, eye pain, itching or discharge EARS: Denies change in hearing, ringing in ears, or earache NOSE: Denies nasal congestion or epistaxis MOUTH and THROAT: Denies oral discomfort, gingival pain or bleeding, pain from teeth or hoarseness   RESPIRATORY: no cough, SOB, DOE, wheezing, hemoptysis CARDIAC: no chest pain, edema or palpitations GI: no abdominal pain, diarrhea, constipation, heart burn, nausea or vomiting GU: Denies dysuria, frequency, hematuria, incontinence, or discharge PSYCHIATRIC: Denies feeling of depression or anxiety. No report of hallucinations, insomnia, paranoia, or agitation   PHYSICAL EXAMINATION  GENERAL APPEARANCE: Well nourished. In no acute distress. Obese SKIN:  Midline abdominal surgical incision (just below umbilicus) is dry, no erythema HEAD: Normal in size and contour. No evidence of trauma EYES: Lids open and close normally. No blepharitis, entropion or ectropion. PERRL. Conjunctivae are clear and sclerae are white. Lenses are without opacity EARS: Pinnae are normal. Patient hears normal voice tunes of the examiner MOUTH and THROAT: Lips are without lesions. Oral mucosa is moist and without lesions. Tongue is normal in shape, size, and color and without lesions NECK: supple, trachea midline, no neck masses, no thyroid tenderness, no thyromegaly LYMPHATICS: no LAN in the neck, no supraclavicular LAN RESPIRATORY: breathing is even & unlabored, BS CTAB CARDIAC: RRR, no murmur,no extra heart sounds, no edema GI: abdomen soft, normal BS, no masses, no tenderness, no hepatomegaly, no splenomegaly EXTREMITIES:  Able to move X 4 extremities PSYCHIATRIC: Alert  and oriented X 3. Affect and behavior are appropriate  LABS/RADIOLOGY: Labs reviewed: Basic Metabolic Panel:  Recent Labs  01/20/16 0500  01/23/16 0520 01/26/16 0358 01/27/16 0354  01/31/16 0500 02/01/16 0423 02/02/16 0447  NA 141  < > 142 137 140  < > 140 140 140  K 3.8  < > 3.8 4.3 4.2  < > 4.0 4.1 4.2  CL 107  < > 108 104 111  < > 109 110 107  CO2 27  < > 27 27 25   < > 24 24 25   GLUCOSE 161*  < > 167* 129* 112*  < > 130* 141* 118*  BUN 9  < > 20 21* 17  < > 14 12 14   CREATININE 0.84  < > 0.64 0.81 0.81  < > 0.80 0.81 0.76  CALCIUM 8.5*  < > 8.4* 8.7* 8.4*  < > 8.8* 9.0 9.1  MG 2.0  --  1.9 2.0 1.9  --   --   --   --   PHOS 3.9  --  4.5 4.7*  --   --   --   --   --   < > = values in this interval not displayed. Liver Function Tests:  Recent Labs  01/23/16 0520 01/26/16 0358 01/27/16 0354  AST 24 77* 50*  ALT 24 60* 52  ALKPHOS 43 56 60  BILITOT 0.3 0.4 0.5  PROT 4.9* 5.3* 5.1*  ALBUMIN 1.9* 2.1* 2.0*   CBC:  Recent Labs  01/19/16 0452  01/23/16 0520  01/30/16 0505 01/31/16 0500 02/01/16 0423 02/02/16 0447  WBC 8.7  < > 8.3  < > 4.7 4.5 6.9 5.7  NEUTROABS 6.0  --  5.3  --  2.4  --   --   --   HGB 9.5*  < > 7.5*  < > 8.3* 7.8* 8.2* 8.4*  HCT 31.9*  < > 24.4*  < > 28.1* 26.3* 27.5* 27.8*  MCV 81.8  < > 81.3  < > 84.4  83.2 83.3 83.0  PLT 255  < > 237  < > 297 291 340 351  < > = values in this interval not displayed.  Cardiac Enzymes:  Recent Labs  01/12/16 0248 01/12/16 1103 01/12/16 1544  TROPONINI 0.06* 0.05* 0.05*   BNP: Invalid input(s): POCBNP CBG:  Recent Labs  02/01/16 2156 02/02/16 [id number] 02/02/16 1207  GLUCAP 123* 123* 139*      Ct Abdomen Pelvis Wo Contrast  01/12/2016  CLINICAL DATA:  Diffuse abdominal pain, nausea, and vomiting beginning today Hx: CAD, Diabetes, HTN, Cardiac cath, appendectomy EXAM: CT ABDOMEN AND PELVIS WITHOUT CONTRAST TECHNIQUE: Multidetector CT imaging of the abdomen and pelvis was performed following the  standard protocol without IV contrast. COMPARISON:  03/13/2013 FINDINGS: Lower chest: Bilateral pleural effusions are present. Bibasilar atelectasis right greater than left. Small pericardial effusion is 7 mm. Aortic valve calcification. Coronary artery calcifications. Upper abdomen: No focal mass identified within the liver or adrenal glands. The pancreas is atrophic without focal mass. And oval low-density mass is identified within the medial aspect of spleen, measuring 11 mm. This cannot be further characterized. Small bilateral renal masses are present. No hydronephrosis. Gallbladder is present. Gastrointestinal tract: Distal esophagus is distended by fluid. The stomach is markedly distended with fluid and air. Small bowel loops are markedly distended. Fecal type material is identified within the distal dilated loops. The most distal ileal loops are not dilated and are entirely collapsed. Colonic loops are collapsed. There are scattered diverticula. No acute diverticulitis. The transition zone is within the right lower quadrant. A small bowel loop abruptly tapers to normal caliber and is best seen on image 57 of series 204, a sagittal root projection. Pelvis: The uterus is absent. No free pelvic fluid. Urinary bladder is distended. Retroperitoneum: There is dense atherosclerotic calcification of the abdominal aorta. Abdominal wall: Unremarkable. Osseous structures: Unremarkable. IMPRESSION: 1. High-grade distal small-bowel obstruction likely related to adhesions. 2. Diverticulosis without acute diverticulitis. 3. Bilateral pleural effusions. 4. Bibasilar atelectasis. 5. Small pericardial effusion. 6. Distal esophagus filled with fluid, likely related to the obstruction. 7. Status post hysterectomy. 8. Small low-attenuation lesion within the spleen, favored to be benign but not fully characterized. Electronically Signed   By: Nolon Nations M.D.   On: 01/12/2016 12:31   Dg Chest 2 View  01/24/2016  CLINICAL  DATA:  Abdominal pain postoperatively EXAM: CHEST  2 VIEW COMPARISON:  01/16/2016 FINDINGS: Cardiac silhouette unchanged. Right PICC line identified with tip to the cavoatrial junction. No pneumothorax. Vascular pattern normal. Lungs clear. Trace pleural fluid bilaterally. IMPRESSION: Tiny pleural effusions otherwise negative Electronically Signed   By: Skipper Cliche M.D.   On: 01/24/2016 11:35   Dg Chest 2 View  01/16/2016  CLINICAL DATA:  Pneumonia EXAM: CHEST  2 VIEW COMPARISON:  01/12/2016 FINDINGS: Cardiac shadow is within normal limits. The lungs are well aerated bilaterally. Small bilateral pleural effusions are seen. No acute bony abnormality is noted. IMPRESSION: No active cardiopulmonary disease. Electronically Signed   By: Inez Catalina M.D.   On: 01/16/2016 07:38   X-ray Chest Pa And Lateral  01/12/2016  CLINICAL DATA:  Sepsis.  Shortness of breath. EXAM: CHEST  2 VIEW COMPARISON:  Chest radiograph from one day prior. FINDINGS: Stable cardiomediastinal silhouette with top-normal heart size. No pneumothorax. Stable trace right pleural effusion. No pulmonary edema. Stable mild patchy right lower lobe opacity. IMPRESSION: 1. Stable mild patchy right lower lobe opacity, which could represent aspiration, atelectasis or pneumonia. 2. Stable trace right  pleural effusion. Electronically Signed   By: Ilona Sorrel M.D.   On: 01/12/2016 08:14   Dg Chest Port 1 View  01/11/2016  CLINICAL DATA:  Shortness of breath. Nausea and weakness for the past 4 days. Vomiting for 1 day. EXAM: PORTABLE CHEST 1 VIEW COMPARISON:  09/18/2015; 07/15/2015; 01/20/2015; 01/18/2014 FINDINGS: Grossly unchanged cardiac silhouette and mediastinal contours given reduced lung volumes, AP projection and patient rotation. Minimal bilateral infrahilar opacities favored to represent atelectasis. There is unchanged mild diffuse slightly nodular thickening of the pulmonary station. No pleural effusion pneumothorax. No evidence of edema.  No acute osseous abnormalities. IMPRESSION: Chronic bronchitic change and bilateral infrahilar atelectasis without definitive superimposed acute cardiopulmonary disease on this hypoventilated AP portable examination. Electronically Signed   By: Sandi Mariscal M.D.   On: 01/11/2016 07:38   Dg Abd 2 Views  01/18/2016  CLINICAL DATA:  Followup for bowel obstruction. EXAM: ABDOMEN - 2 VIEW COMPARISON:  01/14/2016 FINDINGS: There are distended loops of small bowel with multiple air-fluid levels. Small bowel dilation appears increased when compared to the most recent prior exam. Air and contrast is seen within normal caliber colon. There is no free air. IMPRESSION: 1. Findings consistent with a partial small bowel obstruction. The degree of small bowel dilation has increased since the prior exam. No free air. Electronically Signed   By: Lajean Manes M.D.   On: 01/18/2016 11:23   Dg Abd 2 Views  01/15/2016  CLINICAL DATA:  24 hour delay radiograph for small bowel obstruction. EXAM: ABDOMEN - 2 VIEW COMPARISON:  Abdominal radiograph performed earlier today at 5:13 a.m. FINDINGS: Contrast has progressed to the colon. Previously noted dilated small bowel loops are less apparent, suggesting only partial small bowel obstruction at this time. The stomach is partially filled with air and is grossly unremarkable in appearance. An enteric tube is seen ending overlying the body of the stomach. No free intra-abdominal air is seen on the provided decubitus view. No acute osseous abnormalities are identified. The visualized lung bases are grossly clear. IMPRESSION: Contrast has progressed to the colon. Previously noted dilated small bowel loops are less apparent, suggesting only partial small-bowel obstruction at this time. No free intra-abdominal air seen. Electronically Signed   By: Garald Balding M.D.   On: 01/15/2016 01:44   Dg Abd 2 Views  01/13/2016  CLINICAL DATA:  SBO; abd distension EXAM: ABDOMEN - 2 VIEW COMPARISON:   01/12/2016 FINDINGS: Nasogastric tube is in place, tip overlying the level of the stomach. Gas is identified within numerous markedly dilated small bowel loops. Gas is identified within nondilated loops of colon and rectum, consistent with high-grade small bowel obstruction. IMPRESSION: Persistent significant small bowel obstruction. Electronically Signed   By: Nolon Nations M.D.   On: 01/13/2016 12:39   Dg Abd Portable 1v  01/19/2016  CLINICAL DATA:  Patient with abdominal pain for 5 days. EXAM: PORTABLE ABDOMEN - 1 VIEW COMPARISON:  , radiograph 01/18/2016. FINDINGS: Re- demonstrated similar multiple gaseous distended loops of small bowel within the central abdomen. Small amount of contrast within the descending colon. Supine evaluation limited the detection of free intraperitoneal air. IMPRESSION: Findings most compatible with small bowel obstruction. Electronically Signed   By: Lovey Newcomer M.D.   On: 01/19/2016 08:21   Dg Abd Portable 1v-small Bowel Obstruction Protocol-initial, 8 Hr Delay  01/14/2016  CLINICAL DATA:  Small bowel obstruction. Oral contrast at 90.m. last night. Nausea. EXAM: PORTABLE ABDOMEN - 1 VIEW COMPARISON:  01/13/2016 FINDINGS: The contrast  is primarily confined to the stomach and to dilated loops of proximal small bowel. These loops are poorly defined but measure up to 5.5 cm in measurable diameter. There also some gas filled loops of dilated small bowel measuring up to 6.8 cm. The colon contains a tiny amount of gas and does not appear dilated. IMPRESSION: 1. Dilated loops of small bowel with poor progression of contrast medium through the small bowel with compatible with the known small bowel obstruction. Electronically Signed   By: Van Clines M.D.   On: 01/14/2016 08:40   Dg Abd Portable 1v  01/12/2016  CLINICAL DATA:  Verify placement of nasogastric tube P EXAM: PORTABLE ABDOMEN - 1 VIEW COMPARISON:  CT abdomen from earlier same day. FINDINGS: Nasogastric tube is in  the stomach. Stomach remains distended with air. Visualized small bowel remains distended. No evidence of free intraperitoneal air. IMPRESSION: Nasogastric tube in the stomach. Proximal side holes are approximately 7 cm below the level of the GE junction. Electronically Signed   By: Franki Cabot M.D.   On: 01/12/2016 14:25   Dg Intro Long Gi Tube  01/19/2016  CLINICAL DATA:  Small bowel obstruction. Insertion of enteric catheter. EXAM: INTRO LONG GI TUBE CONTRAST:  None. FLUOROSCOPY TIME:  Fluoroscopy Time (in minutes and seconds): 1 minutes 12 seconds. Number of Acquired Images:  one COMPARISON:  Abdominal radiograph 01/12/2016 FINDINGS: Using fluoroscopic guidance, 16 gauge nasogastric catheter was attempted to be inserted through the right nostril unsuccessfully. Subsequently, a 14 gauge nasogastric catheter was inserted through the left nostril and positioning was verified using fluoroscopy. The side hole and tip of the catheter overlie the expected location of gastric body. Partial visualization of the upper abdomen demonstrates small bowel obstruction with maximum diameter of dilated bowel of 3.9 cm. Immediately after insertion of the catheter 950 cc of brownish fluid was suctioned from the stomach. IMPRESSION: Successful fluoroscopy guided placement of nasogastric catheter. Electronically Signed   By: Fidela Salisbury M.D.   On: 01/19/2016 11:26    ASSESSMENT/PLAN:  Physical deconditioning - for rehabilitation  Sepsis due to pneumonia - started empirically on Rocephin and azithromycin; blood cultures 2 negative; has completed 8 days of IV antibiotics and antibiotics had been discontinued 01/18/16; continue benzonatate 200 mg 1 capsule by mouth 3 times a day when necessary ; sepsis resolved  Partial small bowel obstruction -  had diagnostic laparoscopy converted to exploratory laparotomy, lysis of adhesions 01/19/16; continue loperamide 2 mg 1 tab by mouth when necessary for diarrhea, Florastor  250 mg 1 capsule PO BID  Acute blood loss anemia -  continue ferrous sulfate 325 mg 1 tab by mouth after supper ; check CBC  Lab Results  Component Value Date   WBC 5.7 02/02/2016   HGB 8.4* 02/02/2016   HCT 27.8* 02/02/2016   MCV 83.0 02/02/2016   PLT 351 02/02/2016   Diabetes mellitus, type II - continue  mg 1 tab by mouth daily Lab Results  Component Value Date   HGBA1C 7.4* 01/11/2016   Anxiety - patient verbalized that she usually takes Ativan 1 mg and not 0.5 mg; increase Ativan to 1 mg by mouth every 8 hours when necessary and continue clorazepate 15 mg 1 tab by mouth Q HS, Seroquel 50 mg 1 tab by mouth daily; psychiatric consultation with [facility]  Asthma - no SOB; continue albuterol nebulization every 6 hours when necessary, Proventil HFA 90 g inhaler inhale 2 puffs into the lungs every 6 hours when necessary,  Anoro Ellipta 62.5-25 g inhale 1 puff into the lungs daily, Singulair 10 mg 1 tab by mouth daily at bedtime  Urinary retention - continue Flomax 0.4 mg 1 capsule by mouth daily  Hyperlipidemia - continue Crestor 20 mg 1 tab by mouth daily at bedtime and Zetia 10 mg 1 tab PO Q D Lab Results  Component Value Date   TRIG 175* 01/23/2016   Chronic diastolic CHF - continue daily weights; continue Lasix 20 mg 1 tab by mouth daily, metoprolol tartrate 25 mg 1 tab by mouth twice a day; check BMP   Hypothyroidism - continue Synthroid 25 g 1 tab by mouth every morning Lab Results  Component Value Date   TSH 0.400 12/01/2012   CAD - Stable; continue NTG when necessary, aspirin EC 81 mg 1 tab by mouth daily, Plavix 75 mg 1 tab by mouth daily evening   Essential tremor - continue topiramate 25 mg 1 tab by mouth daily at bedtime  GERD - change Prilosec to 20 mg PO Q D  Essential hypertension - continue metoprolol tartrate 25 mg 1 tab by mouth twice a day; BP/HR BID X 1 week  Allergic rhinitis - continue azelastine 0.1% (137 g) 1 spray into each nare twice a day  Chronic  pain - continue oxycodone 5 mg 1- 1 1/2 tab (5mg  - 7.5mg ) by mouth every 4 hours when necessary and Tylenol #3 one tab by mouth every 4 hours when necessary, physiatry consult       Goals of care:  Short-term rehabilitation    Durenda Age, NP [place] [phone]

## 2016-02-06 DIAGNOSIS — R5381 Other malaise: Secondary | ICD-10-CM | POA: Diagnosis not present

## 2016-02-06 DIAGNOSIS — M25511 Pain in right shoulder: Secondary | ICD-10-CM | POA: Diagnosis not present

## 2016-02-06 DIAGNOSIS — M25562 Pain in left knee: Secondary | ICD-10-CM | POA: Diagnosis not present

## 2016-02-06 DIAGNOSIS — M25561 Pain in right knee: Secondary | ICD-10-CM | POA: Diagnosis not present

## 2016-02-06 DIAGNOSIS — R2681 Unsteadiness on feet: Secondary | ICD-10-CM | POA: Diagnosis not present

## 2016-02-06 DIAGNOSIS — M6281 Muscle weakness (generalized): Secondary | ICD-10-CM | POA: Diagnosis not present

## 2016-02-06 LAB — BASIC METABOLIC PANEL
BUN: 20 mg/dL (ref 4–21)
CREATININE: 0.8 mg/dL (ref 0.5–1.1)
Glucose: 106 mg/dL
POTASSIUM: 4.3 mmol/L (ref 3.4–5.3)
SODIUM: 140 mmol/L (ref 137–147)

## 2016-02-06 LAB — CBC AND DIFFERENTIAL
HCT: 34 % — AB (ref 36–46)
HEMOGLOBIN: 10 g/dL — AB (ref 12.0–16.0)
Platelets: 407 10*3/uL — AB (ref 150–399)
WBC: 6.2 10*3/mL

## 2016-02-07 ENCOUNTER — Encounter: Payer: Self-pay | Admitting: Internal Medicine

## 2016-02-07 ENCOUNTER — Non-Acute Institutional Stay (SKILLED_NURSING_FACILITY): Payer: Medicare Other | Admitting: Internal Medicine

## 2016-02-07 DIAGNOSIS — K5669 Other intestinal obstruction: Secondary | ICD-10-CM

## 2016-02-07 DIAGNOSIS — K219 Gastro-esophageal reflux disease without esophagitis: Secondary | ICD-10-CM | POA: Diagnosis not present

## 2016-02-07 DIAGNOSIS — E119 Type 2 diabetes mellitus without complications: Secondary | ICD-10-CM | POA: Diagnosis not present

## 2016-02-07 DIAGNOSIS — R2681 Unsteadiness on feet: Secondary | ICD-10-CM | POA: Diagnosis not present

## 2016-02-07 DIAGNOSIS — J189 Pneumonia, unspecified organism: Secondary | ICD-10-CM | POA: Diagnosis not present

## 2016-02-07 DIAGNOSIS — M25511 Pain in right shoulder: Secondary | ICD-10-CM | POA: Diagnosis not present

## 2016-02-07 DIAGNOSIS — M25561 Pain in right knee: Secondary | ICD-10-CM | POA: Diagnosis not present

## 2016-02-07 DIAGNOSIS — R42 Dizziness and giddiness: Secondary | ICD-10-CM

## 2016-02-07 DIAGNOSIS — I25119 Atherosclerotic heart disease of native coronary artery with unspecified angina pectoris: Secondary | ICD-10-CM | POA: Diagnosis not present

## 2016-02-07 DIAGNOSIS — R1310 Dysphagia, unspecified: Secondary | ICD-10-CM | POA: Diagnosis not present

## 2016-02-07 DIAGNOSIS — J4531 Mild persistent asthma with (acute) exacerbation: Secondary | ICD-10-CM | POA: Diagnosis not present

## 2016-02-07 DIAGNOSIS — I5032 Chronic diastolic (congestive) heart failure: Secondary | ICD-10-CM | POA: Diagnosis not present

## 2016-02-07 DIAGNOSIS — Z794 Long term (current) use of insulin: Secondary | ICD-10-CM

## 2016-02-07 DIAGNOSIS — R339 Retention of urine, unspecified: Secondary | ICD-10-CM | POA: Diagnosis not present

## 2016-02-07 DIAGNOSIS — M25562 Pain in left knee: Secondary | ICD-10-CM | POA: Diagnosis not present

## 2016-02-07 DIAGNOSIS — D62 Acute posthemorrhagic anemia: Secondary | ICD-10-CM | POA: Diagnosis not present

## 2016-02-07 DIAGNOSIS — E039 Hypothyroidism, unspecified: Secondary | ICD-10-CM

## 2016-02-07 DIAGNOSIS — M6281 Muscle weakness (generalized): Secondary | ICD-10-CM | POA: Diagnosis not present

## 2016-02-07 DIAGNOSIS — K56600 Partial intestinal obstruction, unspecified as to cause: Secondary | ICD-10-CM

## 2016-02-07 DIAGNOSIS — R5381 Other malaise: Secondary | ICD-10-CM

## 2016-02-07 DIAGNOSIS — E785 Hyperlipidemia, unspecified: Secondary | ICD-10-CM

## 2016-02-07 NOTE — Progress Notes (Signed)
LOCATION: [facility]  PCP: [name], MD   Code Status: Full Code  Goals of care: Advanced Directive information Advanced Directives 01/11/2016  Does patient have an advance directive? No  Would patient like information on creating an advanced directive? No - patient declined information       Extended Emergency Contact Information Primary Emergency Contact: Helle,Richard Address: [address], [place] [address] [place] Phone: [phone] Work Phone: [phone] Mobile Phone: [phone] Relation: Son Secondary Emergency Contact: Neighbors,Jimmy Address: [address]          [place], [place] [address] [place] of [place] Phone: [phone] Mobile Phone: [phone] Relation: Brother   No Known Allergies  Chief Complaint  Patient presents with  . New Admit To SNF    New Admission     HPI:  Patient is a 82 y.o. female seen today for short term rehabilitation post hospital admission from 01/11/16-02/02/16 with sepsis with acute respiratory failure from right lower lobe community acquired pneumonia. She was started on iv antibiotics. She has now completed her antibiotics. She had partial small bowel obstruction and underwent diagnostic and exploratory laprotomy and lysis of adhesions on 01/19/16. She had acute renal failure and received iv fluids. She also had acute urinary retention and required foley catheter. She is seen in her room today.   Review of Systems:  Constitutional: Negative for fever and diaphoresis. Feels weak and tired. HENT: Negative for headache, congestion, nasal discharge. Positive for difficulty swallowing solids and liquids.   Eyes: Negative for blurred vision Respiratory: Negative for shortness of breath and wheezing. Positive for cough with white phlegm.   Cardiovascular: Negative for chest pain, palpitations, leg swelling.  Gastrointestinal: Negative for nausea, vomiting, abdominal pain. Last bowel  movement was yesterday. Positive for heartburn.  Genitourinary: Negative for flank pain.  Musculoskeletal: Negative for back pain, fall.  Skin: Negative for itching, rash.  Neurological: Positive for dizziness with change of position. Psychiatric/Behavioral: Negative for depression   Past Medical History  Diagnosis Date  . Asthma   . Coronary artery disease   . Diabetes mellitus without complication ([facility])   . Hypothyroidism   . GERD (gastroesophageal reflux disease)   . Insomnia   . Anxiety   . Hypertension   . CAD (coronary artery disease)   . Osteopenia   . Osteoarthritis   . H/O echocardiogram 06-26-2012    EF 55%  . H/O cardiovascular stress test 06-26-2012    compared to previous study there is no significant change; normal myocardial perfusion study. no significant ischemia demonstrated . this is a low risk scan  . History of cardiac cath 09-30-1998    cath done by Dr. Claiborne Billings, successful rotation atherectomy of the left anterior decending artert with reduction from 80 to105  . Hx of cardiac cath 09-26-1998    dignostic cath dr. Claiborne Billings to do rotoblator at a later date  . History of Doppler ultrasound 03-30-2008    neg abd. doppler for aneurysm  . History of Holter monitoring 12-22-2007    NSR with pac occs. pvc's   Past Surgical History  Procedure Laterality Date  . Appendectomy  1947  . Dilation and curettage of uterus  1960  . Vesicovaginal fistula closure w/ tah  1971    Dr Mallie Mussel  . Bladder tack  1970    Dr Rosana Hoes  . Breast lumpectomy  1970    Left, Dr Leandrew Koyanagi  .  Nasal sinus surgery  1980    Left; for chronic sinusitis  . Revision total knee arthroplasty  2005    Dr Alvan Dame Right knee  . Laparoscopy N/A 01/19/2016    Procedure: LAPAROSCOPY DIAGNOSTIC;  Surgeon: Greer Pickerel, MD;  Location: [facility];  Service: General;  Laterality: N/A;  . Lysis of adhesion N/A 01/19/2016    Procedure: LYSIS OF ADHESION;  Surgeon: Greer Pickerel, MD;  Location: [facility];  Service: General;  Laterality:  N/A;   Social History:   reports that she has never smoked. She has never used smokeless tobacco. She reports that she does not drink alcohol or use illicit drugs.  Family History  Problem Relation Age of Onset  . Diabetes Father   . Parkinson's disease Father   . Heart disease Father   . Asthma Father   . Diabetes Mother   . Heart disease Mother   . Heart disease Brother   . Diabetes Brother   . Diabetes Brother   . Diabetes Brother     Medications:   Medication List       This list is accurate as of: 02/07/16  4:20 PM.  Always use your most recent med list.               acetaminophen-codeine 300-30 MG tablet  Commonly known as:  TYLENOL #3  Take 1 tablet by mouth every 4 (four) hours as needed for moderate pain.     albuterol 108 (90 Base) MCG/ACT inhaler  Commonly known as:  PROVENTIL HFA;VENTOLIN HFA  Inhale 2 puffs into the lungs every 6 (six) hours as needed for wheezing or shortness of breath.     albuterol (2.5 MG/3ML) 0.083% nebulizer solution  Commonly known as:  PROVENTIL  Take 3 mLs (2.5 mg total) by nebulization every 6 (six) hours as needed for wheezing.     aspirin EC 81 MG tablet  Take 81 mg by mouth at bedtime.     atorvastatin 80 MG tablet  Commonly known as:  LIPITOR  Take 80 mg by mouth daily.     azelastine 0.1 % nasal spray  Commonly known as:  ASTELIN  Place 1 spray into the nose 2 (two) times daily. Use in each nostril as directed     benzonatate 200 MG capsule  Commonly known as:  TESSALON  TAKE ONE CAPSULE BY MOUTH 3 TIMES A DAY AS NEEDED FOR COUGH     clopidogrel 75 MG tablet  Commonly known as:  PLAVIX  Take 75 mg by mouth every evening.     clorazepate 15 MG tablet  Commonly known as:  TRANXENE  Take 15 mg by mouth at bedtime. anxiety     ferrous sulfate 325 (65 FE) MG tablet  Take 1 tablet (325 mg total) by mouth daily after supper.     furosemide 20 MG tablet  Commonly known as:  LASIX  Take 20 mg by mouth daily.       glimepiride 1 MG tablet  Commonly known as:  AMARYL  Take 1 mg by mouth daily before breakfast.     levothyroxine 50 MCG tablet  Commonly known as:  SYNTHROID, LEVOTHROID  Take 25 mcg by mouth daily.     loperamide 2 MG tablet  Commonly known as:  IMODIUM A-D  Take 2 mg by mouth as needed for diarrhea or loose stools.     LORazepam 1 MG tablet  Commonly known as:  ATIVAN  Take 1 mg by mouth  every 8 (eight) hours as needed for anxiety.     metoprolol tartrate 25 MG tablet  Commonly known as:  LOPRESSOR  Take 1 tablet (25 mg total) by mouth 2 (two) times daily.     montelukast 10 MG tablet  Commonly known as:  SINGULAIR  Take 10 mg by mouth at bedtime.     multivitamin with minerals Tabs tablet  Take 1 tablet by mouth daily.     nitroGLYCERIN 0.4 MG SL tablet  Commonly known as:  NITROSTAT  Place 0.4 mg under the tongue every 5 (five) minutes as needed. For chest pain     omeprazole 20 MG capsule  Commonly known as:  PRILOSEC  Take 20 mg by mouth daily.     oxyCODONE 5 MG immediate release tablet  Commonly known as:  Oxy IR/ROXICODONE  Take 1-1.5 tablets (5-7.5 mg total) by mouth every 4 (four) hours as needed for moderate pain, severe pain or breakthrough pain.     QUEtiapine 25 MG tablet  Commonly known as:  SEROQUEL  Take 50 mg by mouth daily with breakfast.     saccharomyces boulardii 250 MG capsule  Commonly known as:  FLORASTOR  Take 1 capsule (250 mg total) by mouth 2 (two) times daily.     tamsulosin 0.4 MG Caps capsule  Commonly known as:  FLOMAX  Take 1 capsule (0.4 mg total) by mouth daily after breakfast.     topiramate 25 MG tablet  Commonly known as:  TOPAMAX  Take 25 mg by mouth at bedtime.     umeclidinium-vilanterol 62.5-25 MCG/INH Aepb  Commonly known as:  ANORO ELLIPTA  Inhale 1 puff into the lungs daily.     ZETIA 10 MG tablet  Generic drug:  ezetimibe  Take 10 mg by mouth daily.        Immunizations: Immunization History   Administered Date(s) Administered  . Influenza Split 04/20/2012, 06/21/2015  . Influenza Whole 05/24/2005  . PPD Test 07/19/2015  . Pneumococcal Polysaccharide-23 04/20/2012  . Zoster 08/20/2005     Physical Exam  Filed Vitals:   02/07/16 1613  BP: 117/53  Pulse: 99  Temp: 98.4 F (36.9 C)  TempSrc: Oral  Resp: 16  Height: 5\' 1"  (1.549 m)  Weight: 188 lb (85.276 kg)  SpO2: 96%   Body mass index is 35.54 kg/(m^2).  General- elderly female, obese, in no acute distress Head- normocephalic, atraumatic Nose- no nasal discharge Throat- moist mucus membrane Eyes- PERRLA, EOMI, no pallor, no icterus Neck- no cervical lymphadenopathy Cardiovascular- normal s1,s2, no murmur, 1+ leg edema Respiratory- bilateral clear to auscultation, no wheeze, no rhonchi, no crackles, no use of accessory muscles Abdomen- bowel sounds present, soft, non tender. Surgical incision to her abdominal wall healing well Musculoskeletal- able to move all 4 extremities, generalized weakness Neurological- alert and oriented to person, place and time Skin- warm and dry    Labs reviewed: Basic Metabolic Panel:  Recent Labs  01/20/16 0500  01/23/16 0520 01/26/16 0358 01/27/16 0354  01/31/16 0500 02/01/16 0423 02/02/16 0447 02/06/16  NA 141  < > 142 137 140  < > 140 140 140 140  K 3.8  < > 3.8 4.3 4.2  < > 4.0 4.1 4.2 4.3  CL 107  < > 108 104 111  < > 109 110 107  --   CO2 27  < > 27 27 25   < > 24 24 25   --   GLUCOSE 161*  < > 167* 129*  112*  < > 130* 141* 118*  --   BUN 9  < > 20 21* 17  < > 14 12 14 20   CREATININE 0.84  < > 0.64 0.81 0.81  < > 0.80 0.81 0.76 0.8  CALCIUM 8.5*  < > 8.4* 8.7* 8.4*  < > 8.8* 9.0 9.1  --   MG 2.0  --  1.9 2.0 1.9  --   --   --   --   --   PHOS 3.9  --  4.5 4.7*  --   --   --   --   --   --   < > = values in this interval not displayed. Liver Function Tests:  Recent Labs  01/23/16 0520 01/26/16 0358 01/27/16 0354  AST 24 77* 50*  ALT 24 60* 52  ALKPHOS  43 56 60  BILITOT 0.3 0.4 0.5  PROT 4.9* 5.3* 5.1*  ALBUMIN 1.9* 2.1* 2.0*   No results for input(s): LIPASE, AMYLASE in the last 8760 hours. No results for input(s): AMMONIA in the last 8760 hours. CBC:  Recent Labs  01/19/16 0452  01/23/16 0520  01/30/16 0505 01/31/16 0500 02/01/16 0423 02/02/16 0447 02/06/16  WBC 8.7  < > 8.3  < > 4.7 4.5 6.9 5.7 6.2  NEUTROABS 6.0  --  5.3  --  2.4  --   --   --   --   HGB 9.5*  < > 7.5*  < > 8.3* 7.8* 8.2* 8.4* 10.0*  HCT 31.9*  < > 24.4*  < > 28.1* 26.3* 27.5* 27.8* 34*  MCV 81.8  < > 81.3  < > 84.4 83.2 83.3 83.0  --   PLT 255  < > 237  < > 297 291 340 351 407*  < > = values in this interval not displayed. Cardiac Enzymes:  Recent Labs  01/12/16 0248 01/12/16 1103 01/12/16 1544  TROPONINI 0.06* 0.05* 0.05*   BNP: Invalid input(s): POCBNP CBG:  Recent Labs  02/01/16 2156 02/02/16 [id number] 02/02/16 1207  GLUCAP 123* 123* 139*    Radiological Exams: Ct Abdomen Pelvis Wo Contrast  01/12/2016  CLINICAL DATA:  Diffuse abdominal pain, nausea, and vomiting beginning today Hx: CAD, Diabetes, HTN, Cardiac cath, appendectomy EXAM: CT ABDOMEN AND PELVIS WITHOUT CONTRAST TECHNIQUE: Multidetector CT imaging of the abdomen and pelvis was performed following the standard protocol without IV contrast. COMPARISON:  03/13/2013 FINDINGS: Lower chest: Bilateral pleural effusions are present. Bibasilar atelectasis right greater than left. Small pericardial effusion is 7 mm. Aortic valve calcification. Coronary artery calcifications. Upper abdomen: No focal mass identified within the liver or adrenal glands. The pancreas is atrophic without focal mass. And oval low-density mass is identified within the medial aspect of spleen, measuring 11 mm. This cannot be further characterized. Small bilateral renal masses are present. No hydronephrosis. Gallbladder is present. Gastrointestinal tract: Distal esophagus is distended by fluid. The stomach is markedly  distended with fluid and air. Small bowel loops are markedly distended. Fecal type material is identified within the distal dilated loops. The most distal ileal loops are not dilated and are entirely collapsed. Colonic loops are collapsed. There are scattered diverticula. No acute diverticulitis. The transition zone is within the right lower quadrant. A small bowel loop abruptly tapers to normal caliber and is best seen on image 57 of series 204, a sagittal root projection. Pelvis: The uterus is absent. No free pelvic fluid. Urinary bladder is distended. Retroperitoneum: There is dense atherosclerotic calcification of  the abdominal aorta. Abdominal wall: Unremarkable. Osseous structures: Unremarkable. IMPRESSION: 1. High-grade distal small-bowel obstruction likely related to adhesions. 2. Diverticulosis without acute diverticulitis. 3. Bilateral pleural effusions. 4. Bibasilar atelectasis. 5. Small pericardial effusion. 6. Distal esophagus filled with fluid, likely related to the obstruction. 7. Status post hysterectomy. 8. Small low-attenuation lesion within the spleen, favored to be benign but not fully characterized. Electronically Signed   By: Nolon Nations M.D.   On: 01/12/2016 12:31   Dg Chest 2 View  01/24/2016  CLINICAL DATA:  Abdominal pain postoperatively EXAM: CHEST  2 VIEW COMPARISON:  01/16/2016 FINDINGS: Cardiac silhouette unchanged. Right PICC line identified with tip to the cavoatrial junction. No pneumothorax. Vascular pattern normal. Lungs clear. Trace pleural fluid bilaterally. IMPRESSION: Tiny pleural effusions otherwise negative Electronically Signed   By: Skipper Cliche M.D.   On: 01/24/2016 11:35   Dg Chest 2 View  01/16/2016  CLINICAL DATA:  Pneumonia EXAM: CHEST  2 VIEW COMPARISON:  01/12/2016 FINDINGS: Cardiac shadow is within normal limits. The lungs are well aerated bilaterally. Small bilateral pleural effusions are seen. No acute bony abnormality is noted. IMPRESSION: No active  cardiopulmonary disease. Electronically Signed   By: Inez Catalina M.D.   On: 01/16/2016 07:38   X-ray Chest Pa And Lateral  01/12/2016  CLINICAL DATA:  Sepsis.  Shortness of breath. EXAM: CHEST  2 VIEW COMPARISON:  Chest radiograph from one day prior. FINDINGS: Stable cardiomediastinal silhouette with top-normal heart size. No pneumothorax. Stable trace right pleural effusion. No pulmonary edema. Stable mild patchy right lower lobe opacity. IMPRESSION: 1. Stable mild patchy right lower lobe opacity, which could represent aspiration, atelectasis or pneumonia. 2. Stable trace right pleural effusion. Electronically Signed   By: Ilona Sorrel M.D.   On: 01/12/2016 08:14   Dg Chest Port 1 View  01/11/2016  CLINICAL DATA:  Shortness of breath. Nausea and weakness for the past 4 days. Vomiting for 1 day. EXAM: PORTABLE CHEST 1 VIEW COMPARISON:  09/18/2015; 07/15/2015; 01/20/2015; 01/18/2014 FINDINGS: Grossly unchanged cardiac silhouette and mediastinal contours given reduced lung volumes, AP projection and patient rotation. Minimal bilateral infrahilar opacities favored to represent atelectasis. There is unchanged mild diffuse slightly nodular thickening of the pulmonary station. No pleural effusion pneumothorax. No evidence of edema. No acute osseous abnormalities. IMPRESSION: Chronic bronchitic change and bilateral infrahilar atelectasis without definitive superimposed acute cardiopulmonary disease on this hypoventilated AP portable examination. Electronically Signed   By: Sandi Mariscal M.D.   On: 01/11/2016 07:38   Dg Abd 2 Views  01/18/2016  CLINICAL DATA:  Followup for bowel obstruction. EXAM: ABDOMEN - 2 VIEW COMPARISON:  01/14/2016 FINDINGS: There are distended loops of small bowel with multiple air-fluid levels. Small bowel dilation appears increased when compared to the most recent prior exam. Air and contrast is seen within normal caliber colon. There is no free air. IMPRESSION: 1. Findings consistent with  a partial small bowel obstruction. The degree of small bowel dilation has increased since the prior exam. No free air. Electronically Signed   By: Lajean Manes M.D.   On: 01/18/2016 11:23   Dg Abd 2 Views  01/15/2016  CLINICAL DATA:  24 hour delay radiograph for small bowel obstruction. EXAM: ABDOMEN - 2 VIEW COMPARISON:  Abdominal radiograph performed earlier today at 5:13 a.m. FINDINGS: Contrast has progressed to the colon. Previously noted dilated small bowel loops are less apparent, suggesting only partial small bowel obstruction at this time. The stomach is partially filled with air and is grossly  unremarkable in appearance. An enteric tube is seen ending overlying the body of the stomach. No free intra-abdominal air is seen on the provided decubitus view. No acute osseous abnormalities are identified. The visualized lung bases are grossly clear. IMPRESSION: Contrast has progressed to the colon. Previously noted dilated small bowel loops are less apparent, suggesting only partial small-bowel obstruction at this time. No free intra-abdominal air seen. Electronically Signed   By: Garald Balding M.D.   On: 01/15/2016 01:44   Dg Abd 2 Views  01/13/2016  CLINICAL DATA:  SBO; abd distension EXAM: ABDOMEN - 2 VIEW COMPARISON:  01/12/2016 FINDINGS: Nasogastric tube is in place, tip overlying the level of the stomach. Gas is identified within numerous markedly dilated small bowel loops. Gas is identified within nondilated loops of colon and rectum, consistent with high-grade small bowel obstruction. IMPRESSION: Persistent significant small bowel obstruction. Electronically Signed   By: Nolon Nations M.D.   On: 01/13/2016 12:39   Dg Abd Portable 1v  01/19/2016  CLINICAL DATA:  Patient with abdominal pain for 5 days. EXAM: PORTABLE ABDOMEN - 1 VIEW COMPARISON:  , radiograph 01/18/2016. FINDINGS: Re- demonstrated similar multiple gaseous distended loops of small bowel within the central abdomen. Small amount of  contrast within the descending colon. Supine evaluation limited the detection of free intraperitoneal air. IMPRESSION: Findings most compatible with small bowel obstruction. Electronically Signed   By: Lovey Newcomer M.D.   On: 01/19/2016 08:21   Dg Abd Portable 1v-small Bowel Obstruction Protocol-initial, 8 Hr Delay  01/14/2016  CLINICAL DATA:  Small bowel obstruction. Oral contrast at 90.m. last night. Nausea. EXAM: PORTABLE ABDOMEN - 1 VIEW COMPARISON:  01/13/2016 FINDINGS: The contrast is primarily confined to the stomach and to dilated loops of proximal small bowel. These loops are poorly defined but measure up to 5.5 cm in measurable diameter. There also some gas filled loops of dilated small bowel measuring up to 6.8 cm. The colon contains a tiny amount of gas and does not appear dilated. IMPRESSION: 1. Dilated loops of small bowel with poor progression of contrast medium through the small bowel with compatible with the known small bowel obstruction. Electronically Signed   By: Van Clines M.D.   On: 01/14/2016 08:40   Dg Abd Portable 1v  01/12/2016  CLINICAL DATA:  Verify placement of nasogastric tube P EXAM: PORTABLE ABDOMEN - 1 VIEW COMPARISON:  CT abdomen from earlier same day. FINDINGS: Nasogastric tube is in the stomach. Stomach remains distended with air. Visualized small bowel remains distended. No evidence of free intraperitoneal air. IMPRESSION: Nasogastric tube in the stomach. Proximal side holes are approximately 7 cm below the level of the GE junction. Electronically Signed   By: Franki Cabot M.D.   On: 01/12/2016 14:25   Dg Intro Long Gi Tube  01/19/2016  CLINICAL DATA:  Small bowel obstruction. Insertion of enteric catheter. EXAM: INTRO LONG GI TUBE CONTRAST:  None. FLUOROSCOPY TIME:  Fluoroscopy Time (in minutes and seconds): 1 minutes 12 seconds. Number of Acquired Images:  one COMPARISON:  Abdominal radiograph 01/12/2016 FINDINGS: Using fluoroscopic guidance, 16 gauge  nasogastric catheter was attempted to be inserted through the right nostril unsuccessfully. Subsequently, a 14 gauge nasogastric catheter was inserted through the left nostril and positioning was verified using fluoroscopy. The side hole and tip of the catheter overlie the expected location of gastric body. Partial visualization of the upper abdomen demonstrates small bowel obstruction with maximum diameter of dilated bowel of 3.9 cm. Immediately after insertion of  the catheter 950 cc of brownish fluid was suctioned from the stomach. IMPRESSION: Successful fluoroscopy guided placement of nasogastric catheter. Electronically Signed   By: Fidela Salisbury M.D.   On: 01/19/2016 11:26    Assessment/Plan  Physical deconditioning Will have her work with physical therapy and occupational therapy team to help with gait training and muscle strengthening exercises.fall precautions. Skin care. Encourage to be out of bed.   Community acquired pneumonia With respiratory failure. Has completed her antibiotic course and is afebrile. Monitor her breathing.  Urinary retention Foley catheter has been removed and she has been voiding well. Monitor clinically and encourage hydration. Continue her flomax.   Dysphagia Get SLP to evaluate for dysphagia diet  Dizziness With position change, slow position change and hydration encouraged for now. Monitor her vital signs bid for now  GERD Has heartburn. Change omeprazole to 20 mg bid and monitor for now.   Blood loss anemia Improved Hb in facility on lab review. Continue iron supplement and check cbc  Partial small bowel obstruction S/p exploratory laparotomy and lysis of adhesions on 01/19/16. Continue her florastor and continue wound care for her abdomen. Has surgery follow up  Diabetes mellitus type 2 Monitor cbg. Continue amaryl  Asthma Continue her singulair and anoro with prn bronchodilators and monitor  Chronic diastolic chf Continue metoprolol 25  mg bid with lasix and monitor bmp  CAD Chest pain free. Continue metoprolol with aspirin and palvix. continue crestor and prn NTG  Hyperlipidemia  continue crestor 20 mg daily   Hypothyroidism continue synthroid 25 mcg daily    Goals of care: short term rehabilitation   Labs/tests ordered: cbc 02/13/16  Family/ staff Communication: reviewed care plan with patient and nursing supervisor    Blanchie Serve, MD Internal Medicine [place], [place] [address] Cell Phone (Monday-Friday 8 am - 5 pm): [phone] On Call: [phone] and follow prompts after 5 pm and on weekends Office Phone: [phone] Office Fax: [phone]

## 2016-02-08 DIAGNOSIS — R5381 Other malaise: Secondary | ICD-10-CM | POA: Diagnosis not present

## 2016-02-08 DIAGNOSIS — M25561 Pain in right knee: Secondary | ICD-10-CM | POA: Diagnosis not present

## 2016-02-08 DIAGNOSIS — M25562 Pain in left knee: Secondary | ICD-10-CM | POA: Diagnosis not present

## 2016-02-08 DIAGNOSIS — M6281 Muscle weakness (generalized): Secondary | ICD-10-CM | POA: Diagnosis not present

## 2016-02-08 DIAGNOSIS — R2681 Unsteadiness on feet: Secondary | ICD-10-CM | POA: Diagnosis not present

## 2016-02-08 DIAGNOSIS — M25511 Pain in right shoulder: Secondary | ICD-10-CM | POA: Diagnosis not present

## 2016-02-10 DIAGNOSIS — M6281 Muscle weakness (generalized): Secondary | ICD-10-CM | POA: Diagnosis not present

## 2016-02-10 DIAGNOSIS — M25562 Pain in left knee: Secondary | ICD-10-CM | POA: Diagnosis not present

## 2016-02-10 DIAGNOSIS — M25561 Pain in right knee: Secondary | ICD-10-CM | POA: Diagnosis not present

## 2016-02-10 DIAGNOSIS — R5381 Other malaise: Secondary | ICD-10-CM | POA: Diagnosis not present

## 2016-02-10 DIAGNOSIS — M25511 Pain in right shoulder: Secondary | ICD-10-CM | POA: Diagnosis not present

## 2016-02-10 DIAGNOSIS — R2681 Unsteadiness on feet: Secondary | ICD-10-CM | POA: Diagnosis not present

## 2016-02-13 DIAGNOSIS — R5381 Other malaise: Secondary | ICD-10-CM | POA: Diagnosis not present

## 2016-02-13 DIAGNOSIS — M25561 Pain in right knee: Secondary | ICD-10-CM | POA: Diagnosis not present

## 2016-02-13 DIAGNOSIS — M25511 Pain in right shoulder: Secondary | ICD-10-CM | POA: Diagnosis not present

## 2016-02-13 DIAGNOSIS — M25562 Pain in left knee: Secondary | ICD-10-CM | POA: Diagnosis not present

## 2016-02-13 DIAGNOSIS — R2681 Unsteadiness on feet: Secondary | ICD-10-CM | POA: Diagnosis not present

## 2016-02-13 DIAGNOSIS — M6281 Muscle weakness (generalized): Secondary | ICD-10-CM | POA: Diagnosis not present

## 2016-02-13 LAB — CBC AND DIFFERENTIAL
HCT: 32 % — AB (ref 36–46)
HEMOGLOBIN: 9.6 g/dL — AB (ref 12.0–16.0)
Neutrophils Absolute: 2 /uL
PLATELETS: 272 10*3/uL (ref 150–399)
WBC: 5.2 10^3/mL

## 2016-02-27 ENCOUNTER — Encounter: Payer: Self-pay | Admitting: Adult Health

## 2016-02-27 ENCOUNTER — Non-Acute Institutional Stay (SKILLED_NURSING_FACILITY): Payer: Medicare Other | Admitting: Adult Health

## 2016-02-27 DIAGNOSIS — E119 Type 2 diabetes mellitus without complications: Secondary | ICD-10-CM

## 2016-02-27 DIAGNOSIS — D62 Acute posthemorrhagic anemia: Secondary | ICD-10-CM | POA: Diagnosis not present

## 2016-02-27 DIAGNOSIS — R5381 Other malaise: Secondary | ICD-10-CM | POA: Diagnosis not present

## 2016-02-27 DIAGNOSIS — G894 Chronic pain syndrome: Secondary | ICD-10-CM

## 2016-02-27 DIAGNOSIS — E785 Hyperlipidemia, unspecified: Secondary | ICD-10-CM

## 2016-02-27 DIAGNOSIS — I2510 Atherosclerotic heart disease of native coronary artery without angina pectoris: Secondary | ICD-10-CM | POA: Diagnosis not present

## 2016-02-27 DIAGNOSIS — R339 Retention of urine, unspecified: Secondary | ICD-10-CM

## 2016-02-27 DIAGNOSIS — K5669 Other intestinal obstruction: Secondary | ICD-10-CM | POA: Diagnosis not present

## 2016-02-27 DIAGNOSIS — J45909 Unspecified asthma, uncomplicated: Secondary | ICD-10-CM

## 2016-02-27 DIAGNOSIS — G250 Essential tremor: Secondary | ICD-10-CM

## 2016-02-27 DIAGNOSIS — K219 Gastro-esophageal reflux disease without esophagitis: Secondary | ICD-10-CM

## 2016-02-27 DIAGNOSIS — F419 Anxiety disorder, unspecified: Secondary | ICD-10-CM

## 2016-02-27 DIAGNOSIS — J309 Allergic rhinitis, unspecified: Secondary | ICD-10-CM

## 2016-02-27 DIAGNOSIS — I5032 Chronic diastolic (congestive) heart failure: Secondary | ICD-10-CM | POA: Diagnosis not present

## 2016-02-27 DIAGNOSIS — I10 Essential (primary) hypertension: Secondary | ICD-10-CM

## 2016-02-27 DIAGNOSIS — E039 Hypothyroidism, unspecified: Secondary | ICD-10-CM

## 2016-02-27 DIAGNOSIS — K56600 Partial intestinal obstruction, unspecified as to cause: Secondary | ICD-10-CM

## 2016-02-27 NOTE — Progress Notes (Signed)
Patient ID: [name], female   DOB: July 18, 1925, 82 y.o.   MRN: [id number]    DATE:  02/27/2016   MRN:  [id number]  BIRTHDAY: 09/09/1924  Facility:  Nursing Home Location:  [place] Room Number: 1103-A  LEVEL OF CARE:  SNF [phone])  Contact Information    Name Relation Home Work Mobile   Trettin,Richard Son [phone]   Zannie Cove [id number]  [phone]   Mccaskey,Jennifer Grandaughter [phone]  [phone]       Code Status History    Date Active Date Inactive Code Status Order ID Comments User Context   01/11/2016 12:22 PM 02/02/2016  5:38 PM Full Code [id number]  Rondel Jumbo, PA-C ED   07/15/2015  6:24 PM 07/19/2015  5:08 PM Full Code [id number]  Donne Hazel, MD ED   01/18/2014  9:35 PM 01/19/2014  5:52 PM Full Code [id number]  Rise Patience, MD Inpatient       Chief Complaint  Patient presents with  . Discharge Note    HISTORY OF PRESENT ILLNESS:  This is an 82 year old female who is for discharge home with Home health OT, PT, CNA and Nursing. No equipment needed.  She has been admitted to [facility] on 02/02/16 from [facility]. She has PMH of asthma, CAD, chronic diastolic CHF, DM, hypothyroid, GERD and hypertension. She was treated in the hospital for sepsis due to right lower lobe community-acquired pneumonia. She was started empirically on Rocephin and azithromycin. Blood cultures 2 negative. She has completed 8 days of IV antibiotics and sepsis has resolved. Persistent partial small bowel obstruction despite conservative management for several days for which she had diagnostic laparoscopy converted to exploratory laparotomy, lysis of adhesions 01/19/16.   Patient was admitted to this facility for short-term rehabilitation after the patient's recent hospitalization.  Patient has completed SNF rehabilitation and therapy has cleared the patient for discharge.   PAST MEDICAL  HISTORY:  Past Medical History  Diagnosis Date  . Asthma   . Coronary artery disease   . Diabetes mellitus without complication ([place])   . Hypothyroidism   . GERD (gastroesophageal reflux disease)   . Insomnia   . Anxiety   . Hypertension   . CAD (coronary artery disease)   . Osteopenia   . Osteoarthritis   . H/O echocardiogram 06-26-2012    EF 55%  . H/O cardiovascular stress test 06-26-2012    compared to previous study there is no significant change; normal myocardial perfusion study. no significant ischemia demonstrated . this is a low risk scan  . History of cardiac cath 09-30-1998    cath done by Dr. Claiborne Billings, successful rotation atherectomy of the left anterior decending artert with reduction from 80 to105  . Hx of cardiac cath 09-26-1998    dignostic cath dr. Claiborne Billings to do rotoblator at a later date  . History of Doppler ultrasound 03-30-2008    neg abd. doppler for aneurysm  . History of Holter monitoring 12-22-2007    NSR with pac occs. pvc's  . Physical deconditioning   . Sepsis due to pneumonia ([place])   . 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 Anxiety   . Acute blood loss anemia   . HLD (hyperlipidemia)   . Essential tremor   . GAD (generalized anxiety disorder)   . Chronic pain syndrome   . Partial small bowel obstruction ([place])   . Allergic rhinitis  CURRENT MEDICATIONS: Reviewed  Patient's Medications  New Prescriptions   No medications on file  Previous Medications   ACETAMINOPHEN (TYLENOL) 500 MG TABLET    Take 500 mg by mouth every 8 (eight) hours as needed. Take at 8AM, 2PM, 8PM   ACETAMINOPHEN-CODEINE (TYLENOL #3) 300-30 MG TABLET    Take 1 tablet by mouth every 4 (four) hours as needed for moderate pain.   ALBUTEROL (PROVENTIL HFA;VENTOLIN HFA) 108 (90 BASE) MCG/ACT INHALER    Inhale 2 puffs into the lungs every 6 (six) hours as needed for wheezing or shortness of breath.   ALBUTEROL (PROVENTIL) (2.5 MG/3ML)  0.083% NEBULIZER SOLUTION    Take 3 mLs (2.5 mg total) by nebulization every 6 (six) hours as needed for wheezing.   ASPIRIN EC 81 MG TABLET    Take 81 mg by mouth at bedtime.    ATORVASTATIN (LIPITOR) 80 MG TABLET    Take 80 mg by mouth daily.   AZELASTINE (ASTELIN) 137 MCG/SPRAY NASAL SPRAY    Place 1 spray into the nose 2 (two) times daily. Use in each nostril as directed   BENZONATATE (TESSALON) 200 MG CAPSULE    TAKE ONE CAPSULE BY MOUTH 3 TIMES A DAY AS NEEDED FOR COUGH   CLOPIDOGREL (PLAVIX) 75 MG TABLET    Take 75 mg by mouth every evening.    CLORAZEPATE (TRANXENE) 15 MG TABLET    Take 15 mg by mouth at bedtime. anxiety   DOCUSATE SODIUM (COLACE) 100 MG CAPSULE    Take 100 mg by mouth daily.   FERROUS SULFATE 325 (65 FE) MG TABLET    Take 1 tablet (325 mg total) by mouth daily after supper.   FUROSEMIDE (LASIX) 20 MG TABLET    Take 20 mg by mouth daily.   GLIMEPIRIDE (AMARYL) 1 MG TABLET    Take 1 mg by mouth daily before breakfast.   LEVOTHYROXINE (SYNTHROID, LEVOTHROID) 50 MCG TABLET    Take 25 mcg by mouth daily.    LOPERAMIDE (IMODIUM A-D) 2 MG TABLET    Take 2 mg by mouth as needed for diarrhea or loose stools.    LORAZEPAM (ATIVAN) 1 MG TABLET    Take 1 mg by mouth every 8 (eight) hours as needed for anxiety.   METOPROLOL TARTRATE (LOPRESSOR) 25 MG TABLET    Take 1 tablet (25 mg total) by mouth 2 (two) times daily.   MONTELUKAST (SINGULAIR) 10 MG TABLET    Take 10 mg by mouth at bedtime.   MULTIPLE VITAMIN (MULTIVITAMIN WITH MINERALS) TABS TABLET    Take 1 tablet by mouth daily.   NITROGLYCERIN (NITROSTAT) 0.4 MG SL TABLET    Place 0.4 mg under the tongue every 5 (five) minutes as needed. For chest pain   OMEPRAZOLE (PRILOSEC) 20 MG CAPSULE    Take 20 mg by mouth 2 (two) times daily before a meal.    OXYCODONE (OXY IR/ROXICODONE) 5 MG IMMEDIATE RELEASE TABLET    Take 1-1.5 tablets (5-7.5 mg total) by mouth every 4 (four) hours as needed for moderate pain, severe pain or breakthrough  pain.   POLYETHYLENE GLYCOL (MIRALAX / GLYCOLAX) PACKET    Take 17 g by mouth 2 (two) times daily.   QUETIAPINE (SEROQUEL) 25 MG TABLET    Take 50 mg by mouth daily with breakfast.   SACCHAROMYCES BOULARDII (FLORASTOR) 250 MG CAPSULE    Take 1 capsule (250 mg total) by mouth 2 (two) times daily.   TAMSULOSIN (FLOMAX) 0.4 MG CAPS  CAPSULE    Take 1 capsule (0.4 mg total) by mouth daily after breakfast.   TOPIRAMATE (TOPAMAX) 25 MG TABLET    Take 25 mg by mouth at bedtime.   UMECLIDINIUM-VILANTEROL (ANORO ELLIPTA) 62.5-25 MCG/INH AEPB    Inhale 1 puff into the lungs daily.   ZETIA 10 MG TABLET    Take 10 mg by mouth daily.  Modified Medications   No medications on file  Discontinued Medications   No medications on file     No Known Allergies   REVIEW OF SYSTEMS:  GENERAL: no change in appetite, no fatigue, no weight changes, no fever, chills or weakness EYES: Denies change in vision, dry eyes, eye pain, itching or discharge EARS: Denies change in hearing, ringing in ears, or earache NOSE: Denies nasal congestion or epistaxis MOUTH and THROAT: Denies oral discomfort, gingival pain or bleeding, pain from teeth or hoarseness   RESPIRATORY: no cough, SOB, DOE, wheezing, hemoptysis CARDIAC: no chest pain, or palpitations GI: no abdominal pain, diarrhea, constipation, heart burn, nausea or vomiting GU: Denies dysuria, frequency, hematuria, incontinence, or discharge PSYCHIATRIC: Denies feeling of depression or anxiety. No report of hallucinations, insomnia, paranoia, or agitation   PHYSICAL EXAMINATION  GENERAL APPEARANCE: Well nourished. In no acute distress. Obese SKIN:  Midline abdominal surgical incision (just below umbilicus) is healed HEAD: Normal in size and contour. No evidence of trauma EYES: Lids open and close normally. No blepharitis, entropion or ectropion. PERRL. Conjunctivae are clear and sclerae are white. Lenses are without opacity EARS: Pinnae are normal. Patient hears  normal voice tunes of the examiner MOUTH and THROAT: Lips are without lesions. Oral mucosa is moist and without lesions. Tongue is normal in shape, size, and color and without lesions NECK: supple, trachea midline, no neck masses, no thyroid tenderness, no thyromegaly LYMPHATICS: no LAN in the neck, no supraclavicular LAN RESPIRATORY: breathing is even & unlabored, BS CTAB CARDIAC: RRR, no murmur,no extra heart sounds, BLE edema 1+ GI: abdomen soft, normal BS, no masses, no tenderness, no hepatomegaly, no splenomegaly EXTREMITIES:  Able to move X 4 extremities PSYCHIATRIC: Alert and oriented X 3. Affect and behavior are appropriate  LABS/RADIOLOGY: Labs reviewed: Basic Metabolic Panel:  Recent Labs  01/20/16 0500  01/23/16 0520 01/26/16 0358 01/27/16 0354  01/31/16 0500 02/01/16 0423 02/02/16 0447 02/06/16  NA 141  < > 142 137 140  < > 140 140 140 140  K 3.8  < > 3.8 4.3 4.2  < > 4.0 4.1 4.2 4.3  CL 107  < > 108 104 111  < > 109 110 107  --   CO2 27  < > 27 27 25   < > 24 24 25   --   GLUCOSE 161*  < > 167* 129* 112*  < > 130* 141* 118*  --   BUN 9  < > 20 21* 17  < > 14 12 14 20   CREATININE 0.84  < > 0.64 0.81 0.81  < > 0.80 0.81 0.76 0.8  CALCIUM 8.5*  < > 8.4* 8.7* 8.4*  < > 8.8* 9.0 9.1  --   MG 2.0  --  1.9 2.0 1.9  --   --   --   --   --   PHOS 3.9  --  4.5 4.7*  --   --   --   --   --   --   < > = values in this interval not displayed. Liver Function Tests:  Recent Labs  01/23/16 0520 01/26/16 0358 01/27/16 0354  AST 24 77* 50*  ALT 24 60* 52  ALKPHOS 43 56 60  BILITOT 0.3 0.4 0.5  PROT 4.9* 5.3* 5.1*  ALBUMIN 1.9* 2.1* 2.0*   CBC:  Recent Labs  01/23/16 0520  01/30/16 0505 01/31/16 0500 02/01/16 0423 02/02/16 0447 02/06/16 02/13/16  WBC 8.3  < > 4.7 4.5 6.9 5.7 6.2 5.2  NEUTROABS 5.3  --  2.4  --   --   --   --  2  HGB 7.5*  < > 8.3* 7.8* 8.2* 8.4* 10.0* 9.6*  HCT 24.4*  < > 28.1* 26.3* 27.5* 27.8* 34* 32*  MCV 81.3  < > 84.4 83.2 83.3 83.0  --   --    PLT 237  < > 297 291 340 351 407* 272  < > = values in this interval not displayed.  Cardiac Enzymes:  Recent Labs  01/12/16 0248 01/12/16 1103 01/12/16 1544  TROPONINI 0.06* 0.05* 0.05*   BNP: Invalid input(s): POCBNP CBG:  Recent Labs  02/01/16 2156 02/02/16 0632 02/02/16 1207  GLUCAP 123* 123* 139*      ASSESSMENT/PLAN:  Physical deconditioning - for Home health OT, PT, CNA and Nursing  Sepsis due to pneumonia - Resolved  Partial small bowel obstruction -  had diagnostic laparoscopy converted to exploratory laparotomy, lysis of adhesions 01/19/16; continue loperamide 2 mg 1 tab by mouth when necessary for diarrhea, Florastor 250 mg 1 capsule PO BID  Acute blood loss anemia -  continue ferrous sulfate 325 mg 1 tab by mouth after supper Lab Results  Component Value Date   WBC 5.2 02/13/2016   HGB 9.6* 02/13/2016   HCT 32* 02/13/2016   MCV 83.0 02/02/2016   PLT 272 02/13/2016   Diabetes mellitus, type II - continue  Amaryl 1 mg 1 tab by mouth daily Lab Results  Component Value Date   HGBA1C 7.4* 01/11/2016   Anxiety - mood is stable;  continue Ativan to 1 mg by mouth every 8 hours when necessary and continue clorazepate 15 mg 1 tab by mouth Q HS, Seroquel 50 mg 1 tab by mouth daily  Asthma - no SOB; continue albuterol nebulization every 6 hours when necessary, Proventil HFA 90 g inhaler inhale 2 puffs into the lungs every 6 hours when necessary,  Anoro Ellipta 62.5-25 g inhale 1 puff into the lungs daily, Singulair 10 mg 1 tab by mouth daily at bedtime  Urinary retention - continue Flomax 0.4 mg 1 capsule by mouth daily  Hyperlipidemia - continue Crestor 20 mg 1 tab by mouth daily at bedtime and Zetia 10 mg 1 tab PO Q D Lab Results  Component Value Date   TRIG 175* 01/23/2016   Chronic diastolic CHF - continue daily weights; continue Lasix 20 mg 1 tab by mouth daily, metoprolol tartrate 25 mg 1 tab by mouth twice a day  Hypothyroidism - continue  Synthroid 25 g 1 tab by mouth every morning Lab Results  Component Value Date   TSH 0.400 12/01/2012   CAD - Stable; continue NTG when necessary, aspirin EC 81 mg 1 tab by mouth daily, Plavix 75 mg 1 tab by mouth daily evening   Essential tremor - continue topiramate 25 mg 1 tab by mouth daily at bedtime  GERD - recently increased Prilosec to 20 mg PO from Q D to BID  Essential hypertension - continue metoprolol tartrate 25 mg 1 tab by mouth twice a day  Allergic rhinitis - continue  azelastine 0.1% (137 g) 1 spray into each nare twice a day  Chronic pain - continue oxycodone 5 mg 1- 1 1/2 tab (5mg  - 7.5mg ) by mouth every 4 hours when necessary and Tylenol #3 one tab by mouth every 4 hours when necessary        I have filled out patient's discharge paperwork and written prescriptions.  Patient will receive home health PT, OT, Nursing and CNA.  DME provided:  None  Total discharge time: Greater than 30 minutes  Discharge time involved coordination of the discharge process with social worker, nursing staff and therapy department. Medical justification for home health services verified.      Durenda Age, NP [employer] [phone]

## 2016-02-29 DIAGNOSIS — E7800 Pure hypercholesterolemia, unspecified: Secondary | ICD-10-CM | POA: Diagnosis not present

## 2016-02-29 DIAGNOSIS — I5040 Unspecified combined systolic (congestive) and diastolic (congestive) heart failure: Secondary | ICD-10-CM | POA: Diagnosis not present

## 2016-02-29 DIAGNOSIS — I10 Essential (primary) hypertension: Secondary | ICD-10-CM | POA: Diagnosis not present

## 2016-03-06 DIAGNOSIS — M1712 Unilateral primary osteoarthritis, left knee: Secondary | ICD-10-CM | POA: Diagnosis not present

## 2016-03-07 DIAGNOSIS — I110 Hypertensive heart disease with heart failure: Secondary | ICD-10-CM | POA: Diagnosis not present

## 2016-03-07 DIAGNOSIS — Z8701 Personal history of pneumonia (recurrent): Secondary | ICD-10-CM | POA: Diagnosis not present

## 2016-03-07 DIAGNOSIS — E119 Type 2 diabetes mellitus without complications: Secondary | ICD-10-CM | POA: Diagnosis not present

## 2016-03-07 DIAGNOSIS — I2510 Atherosclerotic heart disease of native coronary artery without angina pectoris: Secondary | ICD-10-CM | POA: Diagnosis not present

## 2016-03-07 DIAGNOSIS — I5032 Chronic diastolic (congestive) heart failure: Secondary | ICD-10-CM | POA: Diagnosis not present

## 2016-03-07 DIAGNOSIS — Z7982 Long term (current) use of aspirin: Secondary | ICD-10-CM | POA: Diagnosis not present

## 2016-03-07 DIAGNOSIS — Z7984 Long term (current) use of oral hypoglycemic drugs: Secondary | ICD-10-CM | POA: Diagnosis not present

## 2016-03-07 DIAGNOSIS — F419 Anxiety disorder, unspecified: Secondary | ICD-10-CM | POA: Diagnosis not present

## 2016-03-07 DIAGNOSIS — K219 Gastro-esophageal reflux disease without esophagitis: Secondary | ICD-10-CM | POA: Diagnosis not present

## 2016-03-07 DIAGNOSIS — J45909 Unspecified asthma, uncomplicated: Secondary | ICD-10-CM | POA: Diagnosis not present

## 2016-03-07 DIAGNOSIS — M150 Primary generalized (osteo)arthritis: Secondary | ICD-10-CM | POA: Diagnosis not present

## 2016-03-07 DIAGNOSIS — E039 Hypothyroidism, unspecified: Secondary | ICD-10-CM | POA: Diagnosis not present

## 2016-03-08 DIAGNOSIS — I5032 Chronic diastolic (congestive) heart failure: Secondary | ICD-10-CM | POA: Diagnosis not present

## 2016-03-08 DIAGNOSIS — E119 Type 2 diabetes mellitus without complications: Secondary | ICD-10-CM | POA: Diagnosis not present

## 2016-03-08 DIAGNOSIS — I2510 Atherosclerotic heart disease of native coronary artery without angina pectoris: Secondary | ICD-10-CM | POA: Diagnosis not present

## 2016-03-08 DIAGNOSIS — J45909 Unspecified asthma, uncomplicated: Secondary | ICD-10-CM | POA: Diagnosis not present

## 2016-03-08 DIAGNOSIS — Z8701 Personal history of pneumonia (recurrent): Secondary | ICD-10-CM | POA: Diagnosis not present

## 2016-03-08 DIAGNOSIS — E039 Hypothyroidism, unspecified: Secondary | ICD-10-CM | POA: Diagnosis not present

## 2016-03-08 DIAGNOSIS — I10 Essential (primary) hypertension: Secondary | ICD-10-CM | POA: Diagnosis not present

## 2016-03-08 DIAGNOSIS — J309 Allergic rhinitis, unspecified: Secondary | ICD-10-CM | POA: Diagnosis not present

## 2016-03-08 DIAGNOSIS — I110 Hypertensive heart disease with heart failure: Secondary | ICD-10-CM | POA: Diagnosis not present

## 2016-03-09 DIAGNOSIS — J45909 Unspecified asthma, uncomplicated: Secondary | ICD-10-CM | POA: Diagnosis not present

## 2016-03-09 DIAGNOSIS — Z8701 Personal history of pneumonia (recurrent): Secondary | ICD-10-CM | POA: Diagnosis not present

## 2016-03-09 DIAGNOSIS — I2510 Atherosclerotic heart disease of native coronary artery without angina pectoris: Secondary | ICD-10-CM | POA: Diagnosis not present

## 2016-03-09 DIAGNOSIS — I110 Hypertensive heart disease with heart failure: Secondary | ICD-10-CM | POA: Diagnosis not present

## 2016-03-09 DIAGNOSIS — I5032 Chronic diastolic (congestive) heart failure: Secondary | ICD-10-CM | POA: Diagnosis not present

## 2016-03-09 DIAGNOSIS — E119 Type 2 diabetes mellitus without complications: Secondary | ICD-10-CM | POA: Diagnosis not present

## 2016-03-12 DIAGNOSIS — Z8701 Personal history of pneumonia (recurrent): Secondary | ICD-10-CM | POA: Diagnosis not present

## 2016-03-12 DIAGNOSIS — E119 Type 2 diabetes mellitus without complications: Secondary | ICD-10-CM | POA: Diagnosis not present

## 2016-03-12 DIAGNOSIS — I110 Hypertensive heart disease with heart failure: Secondary | ICD-10-CM | POA: Diagnosis not present

## 2016-03-12 DIAGNOSIS — I5032 Chronic diastolic (congestive) heart failure: Secondary | ICD-10-CM | POA: Diagnosis not present

## 2016-03-12 DIAGNOSIS — I2510 Atherosclerotic heart disease of native coronary artery without angina pectoris: Secondary | ICD-10-CM | POA: Diagnosis not present

## 2016-03-12 DIAGNOSIS — J45909 Unspecified asthma, uncomplicated: Secondary | ICD-10-CM | POA: Diagnosis not present

## 2016-03-13 DIAGNOSIS — E119 Type 2 diabetes mellitus without complications: Secondary | ICD-10-CM | POA: Diagnosis not present

## 2016-03-13 DIAGNOSIS — I2510 Atherosclerotic heart disease of native coronary artery without angina pectoris: Secondary | ICD-10-CM | POA: Diagnosis not present

## 2016-03-13 DIAGNOSIS — Z8701 Personal history of pneumonia (recurrent): Secondary | ICD-10-CM | POA: Diagnosis not present

## 2016-03-13 DIAGNOSIS — I5032 Chronic diastolic (congestive) heart failure: Secondary | ICD-10-CM | POA: Diagnosis not present

## 2016-03-13 DIAGNOSIS — J45909 Unspecified asthma, uncomplicated: Secondary | ICD-10-CM | POA: Diagnosis not present

## 2016-03-13 DIAGNOSIS — I110 Hypertensive heart disease with heart failure: Secondary | ICD-10-CM | POA: Diagnosis not present

## 2016-03-14 DIAGNOSIS — Z8701 Personal history of pneumonia (recurrent): Secondary | ICD-10-CM | POA: Diagnosis not present

## 2016-03-14 DIAGNOSIS — I110 Hypertensive heart disease with heart failure: Secondary | ICD-10-CM | POA: Diagnosis not present

## 2016-03-14 DIAGNOSIS — E119 Type 2 diabetes mellitus without complications: Secondary | ICD-10-CM | POA: Diagnosis not present

## 2016-03-14 DIAGNOSIS — I5032 Chronic diastolic (congestive) heart failure: Secondary | ICD-10-CM | POA: Diagnosis not present

## 2016-03-14 DIAGNOSIS — I2510 Atherosclerotic heart disease of native coronary artery without angina pectoris: Secondary | ICD-10-CM | POA: Diagnosis not present

## 2016-03-14 DIAGNOSIS — J45909 Unspecified asthma, uncomplicated: Secondary | ICD-10-CM | POA: Diagnosis not present

## 2016-03-15 DIAGNOSIS — I5032 Chronic diastolic (congestive) heart failure: Secondary | ICD-10-CM | POA: Diagnosis not present

## 2016-03-15 DIAGNOSIS — E119 Type 2 diabetes mellitus without complications: Secondary | ICD-10-CM | POA: Diagnosis not present

## 2016-03-15 DIAGNOSIS — I2510 Atherosclerotic heart disease of native coronary artery without angina pectoris: Secondary | ICD-10-CM | POA: Diagnosis not present

## 2016-03-15 DIAGNOSIS — Z8701 Personal history of pneumonia (recurrent): Secondary | ICD-10-CM | POA: Diagnosis not present

## 2016-03-15 DIAGNOSIS — J45909 Unspecified asthma, uncomplicated: Secondary | ICD-10-CM | POA: Diagnosis not present

## 2016-03-15 DIAGNOSIS — I110 Hypertensive heart disease with heart failure: Secondary | ICD-10-CM | POA: Diagnosis not present

## 2016-03-16 DIAGNOSIS — J45909 Unspecified asthma, uncomplicated: Secondary | ICD-10-CM | POA: Diagnosis not present

## 2016-03-16 DIAGNOSIS — I2510 Atherosclerotic heart disease of native coronary artery without angina pectoris: Secondary | ICD-10-CM | POA: Diagnosis not present

## 2016-03-16 DIAGNOSIS — Z8701 Personal history of pneumonia (recurrent): Secondary | ICD-10-CM | POA: Diagnosis not present

## 2016-03-16 DIAGNOSIS — E119 Type 2 diabetes mellitus without complications: Secondary | ICD-10-CM | POA: Diagnosis not present

## 2016-03-16 DIAGNOSIS — I5032 Chronic diastolic (congestive) heart failure: Secondary | ICD-10-CM | POA: Diagnosis not present

## 2016-03-16 DIAGNOSIS — I110 Hypertensive heart disease with heart failure: Secondary | ICD-10-CM | POA: Diagnosis not present

## 2016-03-19 DIAGNOSIS — I5032 Chronic diastolic (congestive) heart failure: Secondary | ICD-10-CM | POA: Diagnosis not present

## 2016-03-19 DIAGNOSIS — Z8701 Personal history of pneumonia (recurrent): Secondary | ICD-10-CM | POA: Diagnosis not present

## 2016-03-19 DIAGNOSIS — I2510 Atherosclerotic heart disease of native coronary artery without angina pectoris: Secondary | ICD-10-CM | POA: Diagnosis not present

## 2016-03-19 DIAGNOSIS — J45909 Unspecified asthma, uncomplicated: Secondary | ICD-10-CM | POA: Diagnosis not present

## 2016-03-19 DIAGNOSIS — E119 Type 2 diabetes mellitus without complications: Secondary | ICD-10-CM | POA: Diagnosis not present

## 2016-03-19 DIAGNOSIS — I110 Hypertensive heart disease with heart failure: Secondary | ICD-10-CM | POA: Diagnosis not present

## 2016-03-20 DIAGNOSIS — I5032 Chronic diastolic (congestive) heart failure: Secondary | ICD-10-CM | POA: Diagnosis not present

## 2016-03-20 DIAGNOSIS — E119 Type 2 diabetes mellitus without complications: Secondary | ICD-10-CM | POA: Diagnosis not present

## 2016-03-20 DIAGNOSIS — J45909 Unspecified asthma, uncomplicated: Secondary | ICD-10-CM | POA: Diagnosis not present

## 2016-03-20 DIAGNOSIS — I2510 Atherosclerotic heart disease of native coronary artery without angina pectoris: Secondary | ICD-10-CM | POA: Diagnosis not present

## 2016-03-20 DIAGNOSIS — I110 Hypertensive heart disease with heart failure: Secondary | ICD-10-CM | POA: Diagnosis not present

## 2016-03-20 DIAGNOSIS — Z8701 Personal history of pneumonia (recurrent): Secondary | ICD-10-CM | POA: Diagnosis not present

## 2016-03-21 DIAGNOSIS — I2510 Atherosclerotic heart disease of native coronary artery without angina pectoris: Secondary | ICD-10-CM | POA: Diagnosis not present

## 2016-03-21 DIAGNOSIS — J45909 Unspecified asthma, uncomplicated: Secondary | ICD-10-CM | POA: Diagnosis not present

## 2016-03-21 DIAGNOSIS — I110 Hypertensive heart disease with heart failure: Secondary | ICD-10-CM | POA: Diagnosis not present

## 2016-03-21 DIAGNOSIS — Z8701 Personal history of pneumonia (recurrent): Secondary | ICD-10-CM | POA: Diagnosis not present

## 2016-03-21 DIAGNOSIS — E119 Type 2 diabetes mellitus without complications: Secondary | ICD-10-CM | POA: Diagnosis not present

## 2016-03-21 DIAGNOSIS — I5032 Chronic diastolic (congestive) heart failure: Secondary | ICD-10-CM | POA: Diagnosis not present

## 2016-03-22 DIAGNOSIS — I110 Hypertensive heart disease with heart failure: Secondary | ICD-10-CM | POA: Diagnosis not present

## 2016-03-22 DIAGNOSIS — I2510 Atherosclerotic heart disease of native coronary artery without angina pectoris: Secondary | ICD-10-CM | POA: Diagnosis not present

## 2016-03-22 DIAGNOSIS — I5032 Chronic diastolic (congestive) heart failure: Secondary | ICD-10-CM | POA: Diagnosis not present

## 2016-03-22 DIAGNOSIS — E119 Type 2 diabetes mellitus without complications: Secondary | ICD-10-CM | POA: Diagnosis not present

## 2016-03-22 DIAGNOSIS — J45909 Unspecified asthma, uncomplicated: Secondary | ICD-10-CM | POA: Diagnosis not present

## 2016-03-22 DIAGNOSIS — Z8701 Personal history of pneumonia (recurrent): Secondary | ICD-10-CM | POA: Diagnosis not present

## 2016-03-23 DIAGNOSIS — Z8701 Personal history of pneumonia (recurrent): Secondary | ICD-10-CM | POA: Diagnosis not present

## 2016-03-23 DIAGNOSIS — I2510 Atherosclerotic heart disease of native coronary artery without angina pectoris: Secondary | ICD-10-CM | POA: Diagnosis not present

## 2016-03-23 DIAGNOSIS — J45909 Unspecified asthma, uncomplicated: Secondary | ICD-10-CM | POA: Diagnosis not present

## 2016-03-23 DIAGNOSIS — I110 Hypertensive heart disease with heart failure: Secondary | ICD-10-CM | POA: Diagnosis not present

## 2016-03-23 DIAGNOSIS — I5032 Chronic diastolic (congestive) heart failure: Secondary | ICD-10-CM | POA: Diagnosis not present

## 2016-03-23 DIAGNOSIS — E119 Type 2 diabetes mellitus without complications: Secondary | ICD-10-CM | POA: Diagnosis not present

## 2016-03-26 DIAGNOSIS — I5032 Chronic diastolic (congestive) heart failure: Secondary | ICD-10-CM | POA: Diagnosis not present

## 2016-03-26 DIAGNOSIS — I110 Hypertensive heart disease with heart failure: Secondary | ICD-10-CM | POA: Diagnosis not present

## 2016-03-26 DIAGNOSIS — E119 Type 2 diabetes mellitus without complications: Secondary | ICD-10-CM | POA: Diagnosis not present

## 2016-03-26 DIAGNOSIS — Z8701 Personal history of pneumonia (recurrent): Secondary | ICD-10-CM | POA: Diagnosis not present

## 2016-03-26 DIAGNOSIS — J45909 Unspecified asthma, uncomplicated: Secondary | ICD-10-CM | POA: Diagnosis not present

## 2016-03-26 DIAGNOSIS — I2510 Atherosclerotic heart disease of native coronary artery without angina pectoris: Secondary | ICD-10-CM | POA: Diagnosis not present

## 2016-03-27 DIAGNOSIS — E119 Type 2 diabetes mellitus without complications: Secondary | ICD-10-CM | POA: Diagnosis not present

## 2016-03-27 DIAGNOSIS — Z8701 Personal history of pneumonia (recurrent): Secondary | ICD-10-CM | POA: Diagnosis not present

## 2016-03-27 DIAGNOSIS — I2510 Atherosclerotic heart disease of native coronary artery without angina pectoris: Secondary | ICD-10-CM | POA: Diagnosis not present

## 2016-03-27 DIAGNOSIS — I5032 Chronic diastolic (congestive) heart failure: Secondary | ICD-10-CM | POA: Diagnosis not present

## 2016-03-27 DIAGNOSIS — I110 Hypertensive heart disease with heart failure: Secondary | ICD-10-CM | POA: Diagnosis not present

## 2016-03-27 DIAGNOSIS — J45909 Unspecified asthma, uncomplicated: Secondary | ICD-10-CM | POA: Diagnosis not present

## 2016-03-28 DIAGNOSIS — I110 Hypertensive heart disease with heart failure: Secondary | ICD-10-CM | POA: Diagnosis not present

## 2016-03-28 DIAGNOSIS — Z8701 Personal history of pneumonia (recurrent): Secondary | ICD-10-CM | POA: Diagnosis not present

## 2016-03-28 DIAGNOSIS — I5032 Chronic diastolic (congestive) heart failure: Secondary | ICD-10-CM | POA: Diagnosis not present

## 2016-03-28 DIAGNOSIS — I2510 Atherosclerotic heart disease of native coronary artery without angina pectoris: Secondary | ICD-10-CM | POA: Diagnosis not present

## 2016-03-28 DIAGNOSIS — J45909 Unspecified asthma, uncomplicated: Secondary | ICD-10-CM | POA: Diagnosis not present

## 2016-03-28 DIAGNOSIS — E119 Type 2 diabetes mellitus without complications: Secondary | ICD-10-CM | POA: Diagnosis not present

## 2016-03-29 DIAGNOSIS — I110 Hypertensive heart disease with heart failure: Secondary | ICD-10-CM | POA: Diagnosis not present

## 2016-03-29 DIAGNOSIS — J45909 Unspecified asthma, uncomplicated: Secondary | ICD-10-CM | POA: Diagnosis not present

## 2016-03-29 DIAGNOSIS — E119 Type 2 diabetes mellitus without complications: Secondary | ICD-10-CM | POA: Diagnosis not present

## 2016-03-29 DIAGNOSIS — I2510 Atherosclerotic heart disease of native coronary artery without angina pectoris: Secondary | ICD-10-CM | POA: Diagnosis not present

## 2016-03-29 DIAGNOSIS — I5032 Chronic diastolic (congestive) heart failure: Secondary | ICD-10-CM | POA: Diagnosis not present

## 2016-03-29 DIAGNOSIS — Z8701 Personal history of pneumonia (recurrent): Secondary | ICD-10-CM | POA: Diagnosis not present

## 2016-03-30 DIAGNOSIS — I110 Hypertensive heart disease with heart failure: Secondary | ICD-10-CM | POA: Diagnosis not present

## 2016-03-30 DIAGNOSIS — I2510 Atherosclerotic heart disease of native coronary artery without angina pectoris: Secondary | ICD-10-CM | POA: Diagnosis not present

## 2016-03-30 DIAGNOSIS — L603 Nail dystrophy: Secondary | ICD-10-CM | POA: Diagnosis not present

## 2016-03-30 DIAGNOSIS — I739 Peripheral vascular disease, unspecified: Secondary | ICD-10-CM | POA: Diagnosis not present

## 2016-03-30 DIAGNOSIS — E1151 Type 2 diabetes mellitus with diabetic peripheral angiopathy without gangrene: Secondary | ICD-10-CM | POA: Diagnosis not present

## 2016-03-30 DIAGNOSIS — E119 Type 2 diabetes mellitus without complications: Secondary | ICD-10-CM | POA: Diagnosis not present

## 2016-03-30 DIAGNOSIS — Z8701 Personal history of pneumonia (recurrent): Secondary | ICD-10-CM | POA: Diagnosis not present

## 2016-03-30 DIAGNOSIS — I5032 Chronic diastolic (congestive) heart failure: Secondary | ICD-10-CM | POA: Diagnosis not present

## 2016-03-30 DIAGNOSIS — J45909 Unspecified asthma, uncomplicated: Secondary | ICD-10-CM | POA: Diagnosis not present

## 2016-04-02 DIAGNOSIS — J45909 Unspecified asthma, uncomplicated: Secondary | ICD-10-CM | POA: Diagnosis not present

## 2016-04-02 DIAGNOSIS — I2510 Atherosclerotic heart disease of native coronary artery without angina pectoris: Secondary | ICD-10-CM | POA: Diagnosis not present

## 2016-04-02 DIAGNOSIS — E119 Type 2 diabetes mellitus without complications: Secondary | ICD-10-CM | POA: Diagnosis not present

## 2016-04-02 DIAGNOSIS — Z8701 Personal history of pneumonia (recurrent): Secondary | ICD-10-CM | POA: Diagnosis not present

## 2016-04-02 DIAGNOSIS — I5032 Chronic diastolic (congestive) heart failure: Secondary | ICD-10-CM | POA: Diagnosis not present

## 2016-04-02 DIAGNOSIS — I110 Hypertensive heart disease with heart failure: Secondary | ICD-10-CM | POA: Diagnosis not present

## 2016-04-03 DIAGNOSIS — J45909 Unspecified asthma, uncomplicated: Secondary | ICD-10-CM | POA: Diagnosis not present

## 2016-04-03 DIAGNOSIS — I110 Hypertensive heart disease with heart failure: Secondary | ICD-10-CM | POA: Diagnosis not present

## 2016-04-03 DIAGNOSIS — I5032 Chronic diastolic (congestive) heart failure: Secondary | ICD-10-CM | POA: Diagnosis not present

## 2016-04-03 DIAGNOSIS — I2510 Atherosclerotic heart disease of native coronary artery without angina pectoris: Secondary | ICD-10-CM | POA: Diagnosis not present

## 2016-04-03 DIAGNOSIS — Z8701 Personal history of pneumonia (recurrent): Secondary | ICD-10-CM | POA: Diagnosis not present

## 2016-04-03 DIAGNOSIS — E119 Type 2 diabetes mellitus without complications: Secondary | ICD-10-CM | POA: Diagnosis not present

## 2016-04-05 DIAGNOSIS — I5032 Chronic diastolic (congestive) heart failure: Secondary | ICD-10-CM | POA: Diagnosis not present

## 2016-04-05 DIAGNOSIS — E119 Type 2 diabetes mellitus without complications: Secondary | ICD-10-CM | POA: Diagnosis not present

## 2016-04-05 DIAGNOSIS — Z8701 Personal history of pneumonia (recurrent): Secondary | ICD-10-CM | POA: Diagnosis not present

## 2016-04-05 DIAGNOSIS — I110 Hypertensive heart disease with heart failure: Secondary | ICD-10-CM | POA: Diagnosis not present

## 2016-04-05 DIAGNOSIS — J45909 Unspecified asthma, uncomplicated: Secondary | ICD-10-CM | POA: Diagnosis not present

## 2016-04-05 DIAGNOSIS — I2510 Atherosclerotic heart disease of native coronary artery without angina pectoris: Secondary | ICD-10-CM | POA: Diagnosis not present

## 2016-04-06 DIAGNOSIS — I2510 Atherosclerotic heart disease of native coronary artery without angina pectoris: Secondary | ICD-10-CM | POA: Diagnosis not present

## 2016-04-06 DIAGNOSIS — I5032 Chronic diastolic (congestive) heart failure: Secondary | ICD-10-CM | POA: Diagnosis not present

## 2016-04-06 DIAGNOSIS — Z8701 Personal history of pneumonia (recurrent): Secondary | ICD-10-CM | POA: Diagnosis not present

## 2016-04-06 DIAGNOSIS — E119 Type 2 diabetes mellitus without complications: Secondary | ICD-10-CM | POA: Diagnosis not present

## 2016-04-06 DIAGNOSIS — J45909 Unspecified asthma, uncomplicated: Secondary | ICD-10-CM | POA: Diagnosis not present

## 2016-04-06 DIAGNOSIS — I110 Hypertensive heart disease with heart failure: Secondary | ICD-10-CM | POA: Diagnosis not present

## 2016-04-11 DIAGNOSIS — M1712 Unilateral primary osteoarthritis, left knee: Secondary | ICD-10-CM | POA: Diagnosis not present

## 2016-04-18 DIAGNOSIS — M1712 Unilateral primary osteoarthritis, left knee: Secondary | ICD-10-CM | POA: Diagnosis not present

## 2016-04-20 DIAGNOSIS — M1712 Unilateral primary osteoarthritis, left knee: Secondary | ICD-10-CM | POA: Diagnosis not present

## 2016-04-22 ENCOUNTER — Other Ambulatory Visit: Payer: Self-pay | Admitting: Internal Medicine

## 2016-04-25 DIAGNOSIS — M1712 Unilateral primary osteoarthritis, left knee: Secondary | ICD-10-CM | POA: Diagnosis not present

## 2016-04-27 DIAGNOSIS — M1712 Unilateral primary osteoarthritis, left knee: Secondary | ICD-10-CM | POA: Diagnosis not present

## 2016-05-03 DIAGNOSIS — E119 Type 2 diabetes mellitus without complications: Secondary | ICD-10-CM | POA: Diagnosis not present

## 2016-05-03 DIAGNOSIS — I10 Essential (primary) hypertension: Secondary | ICD-10-CM | POA: Diagnosis not present

## 2016-05-03 DIAGNOSIS — E039 Hypothyroidism, unspecified: Secondary | ICD-10-CM | POA: Diagnosis not present

## 2016-05-07 ENCOUNTER — Telehealth: Payer: Self-pay | Admitting: Internal Medicine

## 2016-05-07 ENCOUNTER — Ambulatory Visit (INDEPENDENT_AMBULATORY_CARE_PROVIDER_SITE_OTHER): Payer: Medicare Other | Admitting: Adult Health

## 2016-05-07 ENCOUNTER — Encounter: Payer: Self-pay | Admitting: Adult Health

## 2016-05-07 DIAGNOSIS — I25119 Atherosclerotic heart disease of native coronary artery with unspecified angina pectoris: Secondary | ICD-10-CM | POA: Diagnosis not present

## 2016-05-07 DIAGNOSIS — J4521 Mild intermittent asthma with (acute) exacerbation: Secondary | ICD-10-CM | POA: Diagnosis not present

## 2016-05-07 MED ORDER — CEFDINIR 300 MG PO CAPS: 300.0000 mg | ORAL_CAPSULE | Freq: Two times a day (BID) | ORAL | 0 refills | Status: DC

## 2016-05-07 MED ORDER — BECLOMETHASONE DIPROPIONATE 40 MCG/ACT IN AERS: 2.0000 | INHALATION_SPRAY | Freq: Two times a day (BID) | RESPIRATORY_TRACT | 5 refills | Status: DC

## 2016-05-07 MED ORDER — PREDNISONE 10 MG PO TABS: ORAL_TABLET | ORAL | 0 refills | Status: DC

## 2016-05-07 NOTE — Patient Instructions (Signed)
Omincef 300mg  Twice daily  For 7 days .  Prednisone taper over next week.  Mucinex DM Twice daily  As needed  Cough/congestion .  Begin QVAR 40 2 puffs Twice daily  , rinse after use.  Continue on ANORO daily  follow up Dr. Annamaria Boots  In 3 months and As needed   Please contact office for sooner follow up if symptoms do not improve or worsen or seek emergency care

## 2016-05-07 NOTE — Addendum Note (Signed)
Addended by: Osa Craver on: 05/07/2016 05:03 PM   Modules accepted: Orders

## 2016-05-07 NOTE — Telephone Encounter (Signed)
Spoke with pt's son and he states that pt has had several asthma flares over the weekend and would like to be seen. Pt was reluctant to see anyone other than CY. Pt did schedule with TP @ 4pm today. Nothing further needed.

## 2016-05-07 NOTE — Assessment & Plan Note (Signed)
Flare   Plan  Patient Instructions  Omincef 300mg  Twice daily  For 7 days .  Prednisone taper over next week.  Mucinex DM Twice daily  As needed  Cough/congestion .  Begin QVAR 40 2 puffs Twice daily  , rinse after use.  Continue on ANORO daily  follow up Dr. Annamaria Boots  In 3 months and As needed   Please contact office for sooner follow up if symptoms do not improve or worsen or seek emergency care

## 2016-05-07 NOTE — Progress Notes (Signed)
Subjective:    Patient ID: [name], female    DOB: 30-Jul-1925, 82 y.o.   MRN: [id number]  HPI 82 year old female never smoker followed for allergic rhinitis, asthma and bronchitis.  05/07/2016 Acute OV  Presents for an acute office visit. She complains of couple of weeks , worse for last 1 week cough , congestion, wheezing , dyspnea, and cough is keeping her up at night.  She is on [facility] daily . Previously on QVAR but unclear why she is not taking.  She denies any hemoptysis, chest pain, orthopnea, PND, or increased leg swelling     Past Medical History:  Diagnosis Date  . Acute blood loss anemia   . Allergic rhinitis   . Anxiety   . Anxiety   . Asthma   . 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 CAD (coronary artery disease)   . Chronic pain syndrome   . Coronary artery disease   . Diabetes mellitus without complication ([place])   . Essential tremor   . GAD (generalized anxiety disorder)   . GERD (gastroesophageal reflux disease)   . H/O cardiovascular stress test 06-26-2012   compared to previous study there is no significant change; normal myocardial perfusion study. no significant ischemia demonstrated . this is a low risk scan  . H/O echocardiogram 06-26-2012   EF 55%  . History of cardiac cath 09-30-1998   cath done by Dr. Claiborne Billings, successful rotation atherectomy of the left anterior decending artert with reduction from 80 to105  . History of Doppler ultrasound 03-30-2008   neg abd. doppler for aneurysm  . History of Holter monitoring 12-22-2007   NSR with pac occs. pvc's  . HLD (hyperlipidemia)   . Hx of cardiac cath 09-26-1998   dignostic cath dr. Claiborne Billings to do rotoblator at a later date  . Hypertension   . Hypothyroidism   . Insomnia   . Osteoarthritis   . Osteopenia   . Partial small bowel obstruction ([place])   . Physical deconditioning   . Sepsis due to pneumonia [facility])     Current Outpatient Prescriptions on File  Prior to Visit  Medication Sig Dispense Refill  . acetaminophen (TYLENOL) 500 MG tablet Take 500 mg by mouth every 8 (eight) hours as needed. Take at 8AM, 2PM, 8PM    . acetaminophen-codeine (TYLENOL #3) 300-30 MG tablet Take 1 tablet by mouth every 4 (four) hours as needed for moderate pain.    Marland Kitchen albuterol (PROVENTIL HFA;VENTOLIN HFA) 108 (90 Base) MCG/ACT inhaler Inhale 2 puffs into the lungs every 6 (six) hours as needed for wheezing or shortness of breath.    Marland Kitchen albuterol (PROVENTIL) (2.5 MG/3ML) 0.083% nebulizer solution Take 3 mLs (2.5 mg total) by nebulization every 6 (six) hours as needed for wheezing. 75 mL 11  . ANORO ELLIPTA 62.5-25 MCG/INH AEPB USE 1 PUFF DAILY 180 each 0  . aspirin EC 81 MG tablet Take 81 mg by mouth at bedtime.     Marland Kitchen atorvastatin (LIPITOR) 80 MG tablet Take 80 mg by mouth daily.    Marland Kitchen azelastine (ASTELIN) 137 MCG/SPRAY nasal spray Place 1 spray into the nose 2 (two) times daily. Use in each nostril as directed    . benzonatate (TESSALON) 200 MG capsule TAKE ONE CAPSULE BY MOUTH 3 TIMES A DAY AS NEEDED FOR COUGH 30 capsule 5  . clopidogrel (PLAVIX) 75 MG tablet Take 75 mg by mouth every evening.     Marland Kitchen  clorazepate (TRANXENE) 15 MG tablet Take 15 mg by mouth at bedtime. anxiety    . docusate sodium (COLACE) 100 MG capsule Take 100 mg by mouth daily.    . ferrous sulfate 325 (65 FE) MG tablet Take 1 tablet (325 mg total) by mouth daily after supper. 30 tablet 3  . furosemide (LASIX) 20 MG tablet Take 20 mg by mouth daily.    Marland Kitchen glimepiride (AMARYL) 1 MG tablet Take 1 mg by mouth daily before breakfast.    . levothyroxine (SYNTHROID, LEVOTHROID) 50 MCG tablet Take 25 mcg by mouth daily.     Marland Kitchen loperamide (IMODIUM A-D) 2 MG tablet Take 2 mg by mouth as needed for diarrhea or loose stools.     Marland Kitchen LORazepam (ATIVAN) 1 MG tablet Take 1 mg by mouth every 8 (eight) hours as needed for anxiety.    . metoprolol tartrate (LOPRESSOR) 25 MG tablet Take 1 tablet (25 mg total) by mouth 2  (two) times daily. 60 tablet 0  . Multiple Vitamin (MULTIVITAMIN WITH MINERALS) TABS tablet Take 1 tablet by mouth daily.    . nitroGLYCERIN (NITROSTAT) 0.4 MG SL tablet Place 0.4 mg under the tongue every 5 (five) minutes as needed. For chest pain    . omeprazole (PRILOSEC) 20 MG capsule Take 20 mg by mouth 2 (two) times daily before a meal.     . oxyCODONE (OXY IR/ROXICODONE) 5 MG immediate release tablet Take 1-1.5 tablets (5-7.5 mg total) by mouth every 4 (four) hours as needed for moderate pain, severe pain or breakthrough pain. 30 tablet 0  . polyethylene glycol (MIRALAX / GLYCOLAX) packet Take 17 g by mouth 2 (two) times daily.    . QUEtiapine (SEROQUEL) 25 MG tablet Take 50 mg by mouth daily with breakfast.    . saccharomyces boulardii (FLORASTOR) 250 MG capsule Take 1 capsule (250 mg total) by mouth 2 (two) times daily.    . tamsulosin (FLOMAX) 0.4 MG CAPS capsule Take 1 capsule (0.4 mg total) by mouth daily after breakfast. 30 capsule   . topiramate (TOPAMAX) 25 MG tablet Take 25 mg by mouth at bedtime.    Marland Kitchen ZETIA 10 MG tablet Take 10 mg by mouth daily.  2  . montelukast (SINGULAIR) 10 MG tablet Take 10 mg by mouth at bedtime.     No current facility-administered medications on file prior to visit.      Review of Systems Constitutional:   No  weight loss, night sweats,  Fevers, chills, fatigue, or  lassitude.  HEENT:   No headaches,  Difficulty swallowing,  Tooth/dental problems, or  Sore throat,                No sneezing, itching, ear ache,  +nasal congestion, post nasal drip,   CV:  No chest pain,  Orthopnea, PND, swelling in lower extremities, anasarca, dizziness, palpitations, syncope.   GI  No heartburn, indigestion, abdominal pain, nausea, vomiting, diarrhea, change in bowel habits, loss of appetite, bloody stools.   Resp: No shortness of breath with exertion or at rest.  No excess mucus, no productive cough,  No non-productive cough,  No coughing up of blood.  No change  in color of mucus.  No wheezing.  No chest wall deformity  Skin: no rash or lesions.  GU: no dysuria, change in color of urine, no urgency or frequency.  No flank pain, no hematuria   MS:  No joint pain or swelling.  No decreased range of motion.  No  back pain.  Psych:  No change in mood or affect. No depression or anxiety.  No memory loss.         Objective:   Physical Exam  Vitals:   05/07/16 1609  BP: 118/72  Pulse: (!) 107  Temp: 98.1 F (36.7 C)  TempSrc: Oral  SpO2: 90%  Weight: 173 lb (78.5 kg)  Height: 5\' 1"  (1.549 m)   GEN: A/Ox3; pleasant , NAD, Elderly, walks with a walker  HEENT:  [place]/AT,  EACs-clear, TMs-wnl, NOSE-clear, THROAT-clear, no lesions, no postnasal drip or exudate noted.   NECK:  Supple w/ fair ROM; no JVD; normal carotid impulses w/o bruits; no thyromegaly or nodules palpated; no lymphadenopathy.    RESP   Trace expiratory wheezes, talks with full sentences no accessory muscle use, no dullness to percussion  CARD:  RRR, no m/r/g  , tr  peripheral edema, pulses intact, no cyanosis or clubbing.  GI:   Soft & nt; nml bowel sounds; no organomegaly or masses detected.   Musco: Warm bil, no deformities or joint swelling noted.   Neuro: alert, no focal deficits noted.    Skin: Warm, no lesions or rashes  Serenah Mill NP-C  [facility]  05/07/2016       Assessment & Plan:

## 2016-05-08 NOTE — Telephone Encounter (Signed)
Katie please advise where we can schedule to pt at in 8 wks,  thanks

## 2016-05-10 NOTE — Telephone Encounter (Signed)
Called spoke with pt son. appt scheduled. Nothing further needed

## 2016-05-10 NOTE — Telephone Encounter (Signed)
Pt can be seen 11-08-25-15 at 11:00am slot for 30 minutes. Slot held for patient. Thanks.

## 2016-05-15 DIAGNOSIS — M1712 Unilateral primary osteoarthritis, left knee: Secondary | ICD-10-CM | POA: Diagnosis not present

## 2016-05-15 DIAGNOSIS — T8484XS Pain due to internal orthopedic prosthetic devices, implants and grafts, sequela: Secondary | ICD-10-CM | POA: Diagnosis not present

## 2016-05-22 DIAGNOSIS — Z23 Encounter for immunization: Secondary | ICD-10-CM | POA: Diagnosis not present

## 2016-05-22 DIAGNOSIS — E7800 Pure hypercholesterolemia, unspecified: Secondary | ICD-10-CM | POA: Diagnosis not present

## 2016-05-22 DIAGNOSIS — R05 Cough: Secondary | ICD-10-CM | POA: Diagnosis not present

## 2016-05-22 DIAGNOSIS — E039 Hypothyroidism, unspecified: Secondary | ICD-10-CM | POA: Diagnosis not present

## 2016-05-22 DIAGNOSIS — I10 Essential (primary) hypertension: Secondary | ICD-10-CM | POA: Diagnosis not present

## 2016-06-04 DIAGNOSIS — I429 Cardiomyopathy, unspecified: Secondary | ICD-10-CM | POA: Diagnosis not present

## 2016-06-04 DIAGNOSIS — E119 Type 2 diabetes mellitus without complications: Secondary | ICD-10-CM | POA: Diagnosis not present

## 2016-06-04 DIAGNOSIS — E039 Hypothyroidism, unspecified: Secondary | ICD-10-CM | POA: Diagnosis not present

## 2016-06-04 DIAGNOSIS — I10 Essential (primary) hypertension: Secondary | ICD-10-CM | POA: Diagnosis not present

## 2016-06-15 ENCOUNTER — Encounter: Payer: Self-pay | Admitting: Cardiovascular Disease

## 2016-06-15 ENCOUNTER — Ambulatory Visit (INDEPENDENT_AMBULATORY_CARE_PROVIDER_SITE_OTHER): Payer: Medicare Other | Admitting: Cardiovascular Disease

## 2016-06-15 VITALS — BP 119/77 | HR 100 | Ht 61.0 in | Wt 175.8 lb

## 2016-06-15 DIAGNOSIS — E119 Type 2 diabetes mellitus without complications: Secondary | ICD-10-CM

## 2016-06-15 DIAGNOSIS — R0600 Dyspnea, unspecified: Secondary | ICD-10-CM

## 2016-06-15 DIAGNOSIS — I480 Paroxysmal atrial fibrillation: Secondary | ICD-10-CM | POA: Diagnosis not present

## 2016-06-15 DIAGNOSIS — I447 Left bundle-branch block, unspecified: Secondary | ICD-10-CM

## 2016-06-15 DIAGNOSIS — I2510 Atherosclerotic heart disease of native coronary artery without angina pectoris: Secondary | ICD-10-CM | POA: Diagnosis not present

## 2016-06-15 DIAGNOSIS — E039 Hypothyroidism, unspecified: Secondary | ICD-10-CM

## 2016-06-15 DIAGNOSIS — Z794 Long term (current) use of insulin: Secondary | ICD-10-CM

## 2016-06-15 DIAGNOSIS — I10 Essential (primary) hypertension: Secondary | ICD-10-CM | POA: Diagnosis not present

## 2016-06-15 LAB — COMPREHENSIVE METABOLIC PANEL
ALK PHOS: 66 U/L (ref 33–130)
ALT: 19 U/L (ref 6–29)
AST: 21 U/L (ref 10–35)
Albumin: 4.1 g/dL (ref 3.6–5.1)
BUN: 30 mg/dL — AB (ref 7–25)
CO2: 25 mmol/L (ref 20–31)
CREATININE: 0.93 mg/dL — AB (ref 0.60–0.88)
Calcium: 9.4 mg/dL (ref 8.6–10.4)
Chloride: 109 mmol/L (ref 98–110)
GLUCOSE: 84 mg/dL (ref 65–99)
Potassium: 5.1 mmol/L (ref 3.5–5.3)
SODIUM: 143 mmol/L (ref 135–146)
TOTAL PROTEIN: 6.7 g/dL (ref 6.1–8.1)
Total Bilirubin: 0.2 mg/dL (ref 0.2–1.2)

## 2016-06-15 LAB — TSH: TSH: 0.3 m[IU]/L — AB

## 2016-06-15 LAB — CBC
HCT: 38.6 % (ref 35.0–45.0)
Hemoglobin: 12.3 g/dL (ref 11.7–15.5)
MCH: 25.7 pg — ABNORMAL LOW (ref 27.0–33.0)
MCHC: 31.9 g/dL — ABNORMAL LOW (ref 32.0–36.0)
MCV: 80.6 fL (ref 80.0–100.0)
MPV: 9.5 fL (ref 7.5–12.5)
PLATELETS: 235 10*3/uL (ref 140–400)
RBC: 4.79 MIL/uL (ref 3.80–5.10)
RDW: 15.1 % — AB (ref 11.0–15.0)
WBC: 5.1 10*3/uL (ref 3.8–10.8)

## 2016-06-15 MED ORDER — METOPROLOL SUCCINATE ER 50 MG PO TB24: ORAL_TABLET | ORAL | 6 refills | Status: DC

## 2016-06-15 NOTE — Patient Instructions (Signed)
Your physician recommends that you return for lab work.  Your physician has recommended you make the following change in your medication:   1.) the metoprolol succ. 50 mg has been changed. Take as directed on the bottle.   Your physician recommends that you schedule a follow-up appointment in: 2-3 months.

## 2016-06-16 LAB — MAGNESIUM: Magnesium: 2.3 mg/dL (ref 1.5–2.5)

## 2016-06-16 LAB — BRAIN NATRIURETIC PEPTIDE: Brain Natriuretic Peptide: 119.4 pg/mL — ABNORMAL HIGH (ref <100)

## 2016-06-17 NOTE — Progress Notes (Signed)
Patient ID: GLORIA RICARDO, female   DOB: 1925/08/05, 82 y.o.   MRN: [id number]    Primary M.D.: Dr. [name] Pulmonary, M.D. : Dr. Keturah Barre  HPI: Elener Custodio Coutant,is a 82 y.o. female who has known coronary artery disease and presents to the office today for a  7 month office followup evaluation.  Mrs Veloso has known CAD and underwent  high-speed rotational atherectomy of a calcified LAD in the region of the very large diagonal vessel by me in 2000. She had excellent angiographic result. She been cared for by Dr. Rex Kras in the past. She has a history of hypothyroidism, hyperlipidemia, asthma, as well as hypertension. She was hospitalized last year for lower chest pain and had transient ECG changes with transient left bundle branch block, right bundle branch block. Her beta blocker dose was reduced. She was felt most likely to have GI symptoms. An upper GI did suggested the possibility of a hiatal hernia. She underwent endoscopy  and she was told of having some small polyps.  She has a history of hypertension and is on Micardis HCT 80/25, Toprol 100 mg in the morning.  She has a history of hyper lipidemia and has been on Zetia 10 mg in addition to Crestor 20 mg daily.  She has been taking Synthroid 25 g for mild hypothyroidism.  Type 2 diabetes mellitus has been treated with Clomid per minute 1 mg.  She continues to be on dual antiplatelet therapy.  Since I saw her one year ago she has had some issues with asthma.  She  recently saw Dr. Cristina Gong and was placed on probiotics.  She has required back injections into her lower spine by Dr. Nelva Bush.  She denies recurrent anginal symptoms.  She denies significant shortness of breath.    She was hospitalized in November 2016 with pneumonia and then  spent 3 weeks at [facility] for rehabilitation.  In January 2017 she developed a significant episode of bronchitis.  He has been weak.  She does note some mild shortness of breath.  Recent blood work on 09/15/2015 by  Dr. Maudie Mercury was reviewed.  Her hemoglobin was 11, hematocrit 36.1.  BUN 54, creatinine 1.4.  Fasting sugar was 162.  Cholesterol was 182 with an HDL of 61 and a calculated LDL of 61, but her triglycerides were elevated at 299.  TSH was 0.49.  Magnesium level was normal.    She underwent an echo Doppler study in March 2017 which showed an EF of 30-35%.  There was grade 1 diastolic dysfunction.  There was mild PA pressure elevation at 35 mm.  Since I last saw her in March 2017, she was hospitalized from May 24 through 02/02/2016 with sepsis due to right lower lobe pneumonia with chronic bronchitis.  She developed a partial small bowel obstruction and underwent initial attempt at laparoscopic but which was converted to exploratory laparotomy with lysis of adhesions on June 1 17.  She had loose stools but C. difficile panel was negative.  She developed acute kidney injury and presented with a creatinine of 2.49, which improved.  Following her hospitalization she went to [facility] for 3 weeks and is now at home and having physical therapy at home.  He has difficulty with walking.  Her breathing has improved.  Past Medical History:  Diagnosis Date  . Acute blood loss anemia   . Allergic rhinitis   . Anxiety   . Anxiety   . Asthma   . Atherosclerosis  of coronary artery bypass graft of native heart without angina pectoris   . CAD (coronary artery disease)   . Chronic pain syndrome   . Coronary artery disease   . Diabetes mellitus without complication ([place])   . Essential tremor   . GAD (generalized anxiety disorder)   . GERD (gastroesophageal reflux disease)   . H/O cardiovascular stress test 06-26-2012   compared to previous study there is no significant change; normal myocardial perfusion study. no significant ischemia demonstrated . this is a low risk scan  . H/O echocardiogram 06-26-2012   EF 55%  . History of cardiac cath 09-30-1998   cath done by Dr. Claiborne Billings, successful rotation  atherectomy of the left anterior decending artert with reduction from 80 to105  . History of Doppler ultrasound 03-30-2008   neg abd. doppler for aneurysm  . History of Holter monitoring 12-22-2007   NSR with pac occs. pvc's  . HLD (hyperlipidemia)   . Hx of cardiac cath 09-26-1998   dignostic cath dr. Claiborne Billings to do rotoblator at a later date  . Hypertension   . Hypothyroidism   . Insomnia   . Osteoarthritis   . Osteopenia   . Partial small bowel obstruction   . Physical deconditioning   . Sepsis due to pneumonia [facility])     Past Surgical History:  Procedure Laterality Date  . APPENDECTOMY  1947  . Bladder Tack  1970   Dr Rosana Hoes  . BREAST LUMPECTOMY  1970   Left, Dr Leandrew Koyanagi  . DILATION AND CURETTAGE OF UTERUS  1960  . LAPAROSCOPY N/A 01/19/2016   Procedure: LAPAROSCOPY DIAGNOSTIC;  Surgeon: Greer Pickerel, MD;  Location: [place];  Service: General;  Laterality: N/A;  . LYSIS OF ADHESION N/A 01/19/2016   Procedure: LYSIS OF ADHESION;  Surgeon: Greer Pickerel, MD;  Location: [place];  Service: General;  Laterality: N/A;  . NASAL SINUS SURGERY  1980   Left; for chronic sinusitis  . REVISION TOTAL KNEE ARTHROPLASTY  2005   Dr Alvan Dame Right knee  . VESICOVAGINAL FISTULA CLOSURE W/ TAH  1971   Dr Mallie Mussel    No Known Allergies  Current Outpatient Prescriptions  Medication Sig Dispense Refill  . acetaminophen (TYLENOL) 500 MG tablet Take 500 mg by mouth every 8 (eight) hours as needed. Take at 8AM, 2PM, 8PM    . acetaminophen-codeine (TYLENOL #3) 300-30 MG tablet Take 1 tablet by mouth every 4 (four) hours as needed for moderate pain.    Marland Kitchen albuterol (PROVENTIL HFA;VENTOLIN HFA) 108 (90 Base) MCG/ACT inhaler Inhale 2 puffs into the lungs every 6 (six) hours as needed for wheezing or shortness of breath.    Marland Kitchen albuterol (PROVENTIL) (2.5 MG/3ML) 0.083% nebulizer solution Take 3 mLs (2.5 mg total) by nebulization every 6 (six) hours as needed for wheezing. 75 mL 11  . ANORO ELLIPTA 62.5-25 MCG/INH AEPB USE 1 PUFF  DAILY 180 each 0  . aspirin EC 81 MG tablet Take 81 mg by mouth at bedtime.     Marland Kitchen azelastine (ASTELIN) 137 MCG/SPRAY nasal spray Place 1 spray into the nose 2 (two) times daily. Use in each nostril as directed    . beclomethasone (QVAR) 40 MCG/ACT inhaler Inhale 2 puffs into the lungs 2 (two) times daily. 1 Inhaler 5  . clopidogrel (PLAVIX) 75 MG tablet Take 75 mg by mouth every evening.     . clorazepate (TRANXENE) 15 MG tablet Take 15 mg by mouth at bedtime. anxiety    . docusate sodium (COLACE)  100 MG capsule Take 100 mg by mouth daily.    . furosemide (LASIX) 20 MG tablet Take 20 mg by mouth daily.    Marland Kitchen glimepiride (AMARYL) 1 MG tablet Take 1 mg by mouth daily before breakfast.    . levothyroxine (SYNTHROID, LEVOTHROID) 50 MCG tablet Take 25 mcg by mouth daily.     Marland Kitchen loperamide (IMODIUM A-D) 2 MG tablet Take 2 mg by mouth as needed for diarrhea or loose stools.     Marland Kitchen LORazepam (ATIVAN) 1 MG tablet Take 1 mg by mouth every 8 (eight) hours as needed for anxiety.    . metoprolol succinate (TOPROL-XL) 50 MG 24 hr tablet Take 1.5 tablet in the morning and 1 tablet in the evening 75 tablet 6  . Multiple Vitamin (MULTIVITAMIN WITH MINERALS) TABS tablet Take 1 tablet by mouth daily.    . nitroGLYCERIN (NITROSTAT) 0.4 MG SL tablet Place 0.4 mg under the tongue every 5 (five) minutes as needed. For chest pain    . omeprazole (PRILOSEC) 20 MG capsule Take 20 mg by mouth 2 (two) times daily before a meal.     . oxyCODONE (OXY IR/ROXICODONE) 5 MG immediate release tablet Take 1-1.5 tablets (5-7.5 mg total) by mouth every 4 (four) hours as needed for moderate pain, severe pain or breakthrough pain. 30 tablet 0  . pantoprazole (PROTONIX) 40 MG tablet Take 40 mg by mouth daily.    . polyethylene glycol (MIRALAX / GLYCOLAX) packet Take 17 g by mouth 2 (two) times daily.    . predniSONE (DELTASONE) 10 MG tablet 4 tabs for 2 days, then 3 tabs for 2 days, 2 tabs for 2 days, then 1 tab for 2 days, then stop 20  tablet 0  . QUEtiapine (SEROQUEL) 25 MG tablet Take 50 mg by mouth daily with breakfast.    . rosuvastatin (CRESTOR) 20 MG tablet Take 20 mg by mouth daily.    Marland Kitchen saccharomyces boulardii (FLORASTOR) 250 MG capsule Take 1 capsule (250 mg total) by mouth 2 (two) times daily.    . tamsulosin (FLOMAX) 0.4 MG CAPS capsule Take 1 capsule (0.4 mg total) by mouth daily after breakfast. 30 capsule   . telmisartan (MICARDIS) 40 MG tablet Take 40 mg by mouth daily.    Marland Kitchen topiramate (TOPAMAX) 25 MG tablet Take 25 mg by mouth at bedtime.    Marland Kitchen ZETIA 10 MG tablet Take 10 mg by mouth daily.  2   No current facility-administered medications for this visit.     Socially she is widowed with one child one grandchild. She with her son. No tobacco history.   ROS General: Negative; No fevers, chills, or night sweats;  HEENT: Negative; No changes in vision or hearing, sinus congestion, difficulty swallowing Pulmonary: Positive for asthma; no shortness of breath, hemoptysis Cardiovascular: Negative; No chest pain, presyncope, syncope, palpitations GI: Negative; No nausea, vomiting, diarrhea, or abdominal pain GU: Negative; No dysuria, hematuria, or difficulty voiding Musculoskeletal: Negative; no myalgias, joint pain, or weakness Hematologic/Oncology: Negative; no easy bruising, bleeding Endocrine: Positive for hypothyroidism and type 2 diabetes mellitus Neuro: Negative; no changes in balance, headaches Skin: Negative; No rashes or skin lesions Psychiatric: Negative; No behavioral problems, depression Sleep: Negative; No snoring, daytime sleepiness, hypersomnolence, bruxism, restless legs, hypnogognic hallucinations, no cataplexy Other comprehensive 14 point system review is negative.    PE BP 119/77 (BP Location: Right Arm, Patient Position: Sitting, Cuff Size: Normal)   Pulse 100   Ht 5' 1"  (1.549 m)  Wt 175 lb 12.8 oz (79.7 kg)   SpO2 93%   BMI 33.22 kg/m    Repeat blood pressure 122/74  Wt  Readings from Last 3 Encounters:  06/15/16 175 lb 12.8 oz (79.7 kg)  05/07/16 173 lb (78.5 kg)  02/27/16 178 lb 12.8 oz (81.1 kg)   General: Alert, oriented, no distress.  HEENT: Normocephalic, atraumatic. Pupils round and reactive; sclera anicteric;  Nose without nasal septal hypertrophy Mouth/Parynx benign; Mallinpatti scale 3 Neck: No JVD, no carotid bruits with normal carotid upstroke Lungs: clear to ausculatation and percussion; no wheezing or rales Heart: RRR, s1 s2 normal 1/6 systolic murmur; no diastolic murmur.  No rubs thrills or heaves. Abdomen: soft, nontender; no hepatosplenomehaly, BS+; abdominal aorta nontender and not dilated by palpation. Pulses 2+ Extremities: no clubbing cyanosis or edema, Homan's sign negative  Neurologic: grossly nonfocal Psychological: Normal affect and mood  ECG (independently read by me): Sinus rhythm at 100 bpm with left bundle branch block.  March 2017 ECG (independently read by me):  Sinus rhythm at 93 bpm with PAC.  Left bundle branch block with repolarization changes.  December 2015 ECG (independently read by me).  Normal sinus rhythm. Left Bundle branch block with repolarization changes compared to her prior tracing of one year ago.  Prior November 2014 ECG: Normal sinus rhythm at 79 beats per minute. Mild incomplete conduction delay. Normal intervals.  LABS: recent blood work by Dr. [name] from September 15 2015 was reviewed in detail with the patient.  I have reviewed her hospitalization records from her admission of May 24 through 02/02/2016.  BMET  BMP Latest Ref Rng & Units 06/15/2016 02/06/2016 02/02/2016  Glucose 65 - 99 mg/dL 84 - 118(H)  BUN 7 - 25 mg/dL 30(H) 20 14  Creatinine 0.60 - 0.88 mg/dL 0.93(H) 0.8 0.76  Sodium 135 - 146 mmol/L 143 140 140  Potassium 3.5 - 5.3 mmol/L 5.1 4.3 4.2  Chloride 98 - 110 mmol/L 109 - 107  CO2 20 - 31 mmol/L 25 - 25  Calcium 8.6 - 10.4 mg/dL 9.4 - 9.1   Hepatic Function Latest Ref Rng &  Units 06/15/2016 01/27/2016 01/26/2016  Total Protein 6.1 - 8.1 g/dL 6.7 5.1(L) 5.3(L)  Albumin 3.6 - 5.1 g/dL 4.1 2.0(L) 2.1(L)  AST 10 - 35 U/L 21 50(H) 77(H)  ALT 6 - 29 U/L 19 52 60(H)  Alk Phosphatase 33 - 130 U/L 66 60 56  Total Bilirubin 0.2 - 1.2 mg/dL 0.2 0.5 0.4  Bilirubin, Direct 0.1 - 0.5 mg/dL - - -   CBC Latest Ref Rng & Units 06/15/2016 02/13/2016 02/06/2016  WBC 3.8 - 10.8 K/uL 5.1 5.2 6.2  Hemoglobin 11.7 - 15.5 g/dL 12.3 9.6(A) 10.0(A)  Hematocrit 35.0 - 45.0 % 38.6 32(A) 34(A)  Platelets 140 - 400 K/uL 235 272 407(A)   Lab Results  Component Value Date   MCV 80.6 06/15/2016   MCV 83.0 02/02/2016   MCV 83.3 02/01/2016   Lab Results  Component Value Date   TSH 0.30 (L) 06/15/2016   Lipid Panel     Component Value Date/Time   TRIG 175 (H) 01/23/2016 0520     RADIOLOGY: No results found.   ASSESSMENT AND PLAN: Ms. Scobey is an 82 years old white female who is  status post high-speed rotational atherectomy of a very calcified LAD In 2000.  She continues to be without anginal symptomatology.  She has been on chronic  antiplatelet therapy and continues to tolerate this well  without bleeding.   An echo Doppler study November 2013 showed an ejection fraction greater than 46% normal systolic function with grade 1 diastolic dysfunction. She did have moderate aortic sclerosis without stenosis. There was mild mitral regurgitation and tricuspid regurgitation. A nuclear perfusion study November 2013 revealed normal perfusion without scar or ischemia. Post-rest ejection fraction was 73%.  A follow-up echo Doppler study in March 2017.  However, showed her EF at 30-35%.  She was hospitalized for 3 weeks.  This past summer with sepsis, ammonia, and developed a small bowel obstruction for which she required surgery for lysis of adhesions.  Has undergone rehabilitation posthospitalization.  At present, she is not having significant chest pain or shortness of breath.  She has difficulty  walking.  She is tachycardiac at rest with sinus rhythm at 100 bpm with her left bundle branch block.  I have recommended slight additional titration of her metoprolol succinate, which according to her records has been 50 mg twice a day.  She will increase this to 75 times in the morning and 50 mg at night.  She continues to be on Micardis 40 mg daily.  Her blood pressure is stable.  There are no signs of overt CHF.  She continues to be on dual antiplatelet therapy with aspirin and Plavix.  A complete set of blood work will be obtained including a metabolic panel, magnesium, TSH, CBC, brain natruretic peptide.  I will see her in 2-3 months for reevaluation.  Time spent: 25 minutes.  Troy Sine, MD, [facility]  06/17/2016 9:49 AM

## 2016-06-19 ENCOUNTER — Other Ambulatory Visit: Payer: Self-pay | Admitting: Cardiovascular Disease

## 2016-06-19 ENCOUNTER — Telehealth: Payer: Self-pay | Admitting: *Deleted

## 2016-06-19 DIAGNOSIS — E038 Other specified hypothyroidism: Secondary | ICD-10-CM

## 2016-06-19 NOTE — Telephone Encounter (Signed)
-----   Message from Troy Sine, MD sent at 06/18/2016 11:36 AM EDT ----- Labs good x TSH is oversuppressed on levothyroxine at 25 ug; dc levothyroxine and re-check TSH in 1 month.

## 2016-06-19 NOTE — Telephone Encounter (Signed)
Call and notified patient of lab results and recommendations. Patient voiced verbal understanding. She wrote down the medication name so that she can show to her son who she states takes care of her medications.

## 2016-06-29 DIAGNOSIS — E1142 Type 2 diabetes mellitus with diabetic polyneuropathy: Secondary | ICD-10-CM | POA: Diagnosis not present

## 2016-07-02 DIAGNOSIS — E119 Type 2 diabetes mellitus without complications: Secondary | ICD-10-CM | POA: Diagnosis not present

## 2016-07-04 ENCOUNTER — Ambulatory Visit (INDEPENDENT_AMBULATORY_CARE_PROVIDER_SITE_OTHER): Payer: Medicare Other | Admitting: Internal Medicine

## 2016-07-04 ENCOUNTER — Encounter: Payer: Self-pay | Admitting: Internal Medicine

## 2016-07-04 DIAGNOSIS — I2510 Atherosclerotic heart disease of native coronary artery without angina pectoris: Secondary | ICD-10-CM | POA: Diagnosis not present

## 2016-07-04 DIAGNOSIS — J4520 Mild intermittent asthma, uncomplicated: Secondary | ICD-10-CM

## 2016-07-04 NOTE — Patient Instructions (Signed)
Ok to call for med refis when needed  My office will call Dr Maudie Mercury to find out which pneumonia vaccine you had for our records

## 2016-07-04 NOTE — Assessment & Plan Note (Signed)
At her age I don't really expect her to feel "well" but I don't think anything acute is going on. Plan-she will call to refill Anoro inhaler when needed

## 2016-07-04 NOTE — Progress Notes (Signed)
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 Air-trapping with increased residual volume. Diffusion mildly reduced. FEC 1.61/76%, 11.05/77%, FEV1/FVC 0.65, FEF 25-75% 0.56/33%. RV 134%, DLCO 76%.    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 Pt has cough-non productive; feels as though phelgm is caught in throat area-can not get up; dry mouth all the time. Head and sinus area feels dry per patient. CBC 09/18/2015-hemoglobin 10.6, low but best she's had in 3 months Potassium was low 3.0 with BUNs 52, creatinine 1.47 Saw Dr. 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 LOV-NP-05/07/2016-acute visit for exacerbation of asthmatic bronchitis treated with Omnicef, prednisone taper, resumption of Qvar and continuation of Anoro. Now probably at baseline. She never feels strong or particularly well but denies chest pain, significant cough or wheeze, palpitation, discolored sputum.  ROS-see HPI Constitutional:   No-   weight loss, night sweats, fevers, chills, fatigue, lassitude. HEENT:   No-  headaches, difficulty swallowing, tooth/dental problems, sore throat,       No-  sneezing, itching, ear ache, +nasal congestion, +post nasal drip,  CV:  No-   chest pain,  orthopnea, PND, swelling in lower extremities, anasarca,                                                        dizziness, palpitations Resp:+ shortness of breath with exertion or at rest.              No-   productive cough,  No non-productive cough,  No- coughing up of blood.              No-   change in color of mucus.  + wheezing.   Skin: No-   rash or lesions. GI:  No-   heartburn, indigestion, abdominal pain, nausea, vomiting,  GU:  MS:  No-   joint pain or swelling.   Neuro-     nothing unusual Psych:  No- change in mood or affect. No depression or anxiety.  No memory loss.  OBJ- Physical Exam General- Alert, Oriented, Affect-appropriate, Distress- none acute, + obese, + elderly Skin- rash-none, lesions- none, excoriation- none Lymphadenopathy- none Head- atraumatic            Eyes- Gross vision intact, PERRLA, conjunctivae and secretions clear            Ears- Hearing, canals-normal            Nose- Clear, no-Septal dev, mucus, polyps, erosion, perforation             Throat- Mallampati III , mucosa clear , drainage- none, tonsils- atrophic, + broken tooth Neck- flexible , trachea midline, no stridor , thyroid nl, carotid no bruit Chest - symmetrical excursion , unlabored  Heart/CV- RRR , no murmur heard , no gallop  , no rub, nl s1 s2                           - JVD- none , edema- none, stasis changes- none, varices- none           Lung- unlabored, wheeze-none, cough-none, dullness-none, rub- none.            Chest wall-  Abd-  Br/ Gen/ Rectal- Not done, not indicated Extrem- cyanosis- none, clubbing, none, atrophy- none, strength- nl. + Walker Neuro- grossly intact to observation

## 2016-07-16 DIAGNOSIS — E038 Other specified hypothyroidism: Secondary | ICD-10-CM | POA: Diagnosis not present

## 2016-07-17 LAB — TSH: TSH: 1.49 mIU/L

## 2016-07-18 ENCOUNTER — Emergency Department (HOSPITAL_COMMUNITY)
Admission: EM | Admit: 2016-07-18 | Discharge: 2016-07-19 | Disposition: A | Discharge status: Home / Self Care | Payer: Medicare Other | Attending: Emergency Medicine | Admitting: Emergency Medicine

## 2016-07-18 ENCOUNTER — Encounter (HOSPITAL_COMMUNITY): Payer: Self-pay | Admitting: Emergency Medicine

## 2016-07-18 ENCOUNTER — Emergency Department (HOSPITAL_COMMUNITY): Payer: Medicare Other

## 2016-07-18 DIAGNOSIS — I2510 Atherosclerotic heart disease of native coronary artery without angina pectoris: Secondary | ICD-10-CM | POA: Insufficient documentation

## 2016-07-18 DIAGNOSIS — Z7984 Long term (current) use of oral hypoglycemic drugs: Secondary | ICD-10-CM | POA: Insufficient documentation

## 2016-07-18 DIAGNOSIS — R51 Headache: Secondary | ICD-10-CM | POA: Diagnosis not present

## 2016-07-18 DIAGNOSIS — I160 Hypertensive urgency: Secondary | ICD-10-CM | POA: Diagnosis not present

## 2016-07-18 DIAGNOSIS — E039 Hypothyroidism, unspecified: Secondary | ICD-10-CM | POA: Diagnosis not present

## 2016-07-18 DIAGNOSIS — J45909 Unspecified asthma, uncomplicated: Secondary | ICD-10-CM | POA: Insufficient documentation

## 2016-07-18 DIAGNOSIS — Z96651 Presence of right artificial knee joint: Secondary | ICD-10-CM | POA: Diagnosis not present

## 2016-07-18 DIAGNOSIS — Z7982 Long term (current) use of aspirin: Secondary | ICD-10-CM | POA: Insufficient documentation

## 2016-07-18 DIAGNOSIS — E119 Type 2 diabetes mellitus without complications: Secondary | ICD-10-CM | POA: Diagnosis not present

## 2016-07-18 DIAGNOSIS — G4489 Other headache syndrome: Secondary | ICD-10-CM | POA: Diagnosis not present

## 2016-07-18 DIAGNOSIS — R519 Headache, unspecified: Secondary | ICD-10-CM

## 2016-07-18 DIAGNOSIS — I10 Essential (primary) hypertension: Secondary | ICD-10-CM | POA: Diagnosis not present

## 2016-07-18 LAB — URINALYSIS, ROUTINE W REFLEX MICROSCOPIC
Bilirubin Urine: NEGATIVE
Glucose, UA: NEGATIVE mg/dL
Hgb urine dipstick: NEGATIVE
Ketones, ur: NEGATIVE mg/dL
Nitrite: NEGATIVE
PROTEIN: NEGATIVE mg/dL
Specific Gravity, Urine: 1.021 (ref 1.005–1.030)
pH: 6 (ref 5.0–8.0)

## 2016-07-18 LAB — COMPREHENSIVE METABOLIC PANEL
ALT: 30 U/L (ref 14–54)
ANION GAP: 8 (ref 5–15)
AST: 29 U/L (ref 15–41)
Albumin: 3.6 g/dL (ref 3.5–5.0)
Alkaline Phosphatase: 60 U/L (ref 38–126)
BUN: 26 mg/dL — ABNORMAL HIGH (ref 6–20)
CHLORIDE: 106 mmol/L (ref 101–111)
CO2: 26 mmol/L (ref 22–32)
CREATININE: 0.87 mg/dL (ref 0.44–1.00)
Calcium: 9.3 mg/dL (ref 8.9–10.3)
GFR, EST NON AFRICAN AMERICAN: 57 mL/min — AB (ref >60)
Glucose, Bld: 109 mg/dL — ABNORMAL HIGH (ref 65–99)
POTASSIUM: 3.7 mmol/L (ref 3.5–5.1)
Sodium: 140 mmol/L (ref 135–145)
Total Bilirubin: 0.4 mg/dL (ref 0.3–1.2)
Total Protein: 6.6 g/dL (ref 6.5–8.1)

## 2016-07-18 LAB — CBC WITH DIFFERENTIAL/PLATELET
BASOS ABS: 0 10*3/uL (ref 0.0–0.1)
Basophils Relative: 1 %
EOS ABS: 0.3 10*3/uL (ref 0.0–0.7)
EOS PCT: 6 %
HCT: 36.5 % (ref 36.0–46.0)
Hemoglobin: 11.6 g/dL — ABNORMAL LOW (ref 12.0–15.0)
LYMPHS PCT: 26 %
Lymphs Abs: 1.5 10*3/uL (ref 0.7–4.0)
MCH: 26.4 pg (ref 26.0–34.0)
MCHC: 31.8 g/dL (ref 30.0–36.0)
MCV: 83 fL (ref 78.0–100.0)
Monocytes Absolute: 0.6 10*3/uL (ref 0.1–1.0)
Monocytes Relative: 11 %
Neutro Abs: 3.2 10*3/uL (ref 1.7–7.7)
Neutrophils Relative %: 56 %
PLATELETS: 183 10*3/uL (ref 150–400)
RBC: 4.4 MIL/uL (ref 3.87–5.11)
RDW: 15.8 % — ABNORMAL HIGH (ref 11.5–15.5)
WBC: 5.7 10*3/uL (ref 4.0–10.5)

## 2016-07-18 LAB — URINE MICROSCOPIC-ADD ON

## 2016-07-18 LAB — TROPONIN I

## 2016-07-18 MED ORDER — ACETAMINOPHEN 500 MG PO TABS
500.0000 mg | ORAL_TABLET | Freq: Once | ORAL | Status: AC
  Administered 2016-07-18: 500 mg via ORAL
  Filled 2016-07-18: qty 1

## 2016-07-18 NOTE — ED Triage Notes (Signed)
Per EMS: pt has had a headache since 0800. Pain started behind her left eye and now it is her whole head that hurts.  Pt took tylenol and it took the pain from a 10 to an 8. Pt denies and n/v or light sensitivity. Pt was hptn at the scene 200/90. But her last BP was 156/76. Pt is A&O x 4 Pt denies and numbness, weakness or tingling. Speech is clear. HR in the 80's, 95% on room air and CBG is 106.

## 2016-07-18 NOTE — Discharge Instructions (Signed)
As discussed, your evaluation today has been largely reassuring.  But, it is important that you monitor your condition carefully, and do not hesitate to return to the ED if you develop new, or concerning changes in your condition.  Otherwise, please follow-up with your physician for appropriate ongoing care.  Please begin taking 1.5 tablets of metoprolol twice daily.  Discuss this with your physician.

## 2016-07-18 NOTE — ED Provider Notes (Signed)
[facility] DEPT Provider Note   CSN: [id number] Arrival date & time: 07/18/16  1913     History   Chief Complaint Chief Complaint  Patient presents with  . Headache    HPI [name] is a 82 y.o. female.  HPI  Patient presents with concern of headache, lower extremity pain. She is here with her son who assists with the history of present illness. She acknowledges multiple medical issues including recent episode of pneumonia, abdominal surgery for bowel obstruction, hypertension, CAD. However, she states that she was generally well until today, when about 10 hours ago she developed bilateral lower extremity pain. Subsequently, she developed some discomfort in her head, sore, moderate, diffuse. In the interval she was able to go have her hair done. She states that she has been taking all medication as directed, but has not taken any medication for pain relief. She denies any weakness in any extremity, any vision loss, any incontinence, any confusion, any disorientation.   Past Medical History:  Diagnosis Date  . Acute blood loss anemia   . Allergic rhinitis   . Anxiety   . Anxiety   . Asthma   . 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 CAD (coronary artery disease)   . Chronic pain syndrome   . Coronary artery disease   . Diabetes mellitus without complication ([place])   . Essential tremor   . GAD (generalized anxiety disorder)   . GERD (gastroesophageal reflux disease)   . H/O cardiovascular stress test 06-26-2012   compared to previous study there is no significant change; normal myocardial perfusion study. no significant ischemia demonstrated . this is a low risk scan  . H/O echocardiogram 06-26-2012   EF 55%  . History of cardiac cath 09-30-1998   cath done by Dr. Claiborne Billings, successful rotation atherectomy of the left anterior decending artert with reduction from 80 to105  . History of Doppler ultrasound 03-30-2008     neg abd. doppler for aneurysm  . History of Holter monitoring 12-22-2007   NSR with pac occs. pvc's  . HLD (hyperlipidemia)   . Hx of cardiac cath 09-26-1998   dignostic cath dr. Claiborne Billings to do rotoblator at a later date  . Hypertension   . Hypothyroidism   . Insomnia   . Osteoarthritis   . Osteopenia   . Partial small bowel obstruction   . Physical deconditioning   . Sepsis due to pneumonia [facility])     Patient Active Problem List   Diagnosis Date Noted  . Bowel obstruction   . Preop cardiovascular exam 01/18/2016  . SBO (small bowel obstruction) 01/12/2016  . Hypokalemia 01/12/2016  . Sepsis ([place]) 01/11/2016  . Acute on chronic respiratory failure ([place]) 01/11/2016  . AKI (acute kidney injury) ([place])   . Diabetes mellitus with complication ([place])   . UTI (lower urinary tract infection)   . Cough 12/29/2015  . Acid reflux 12/29/2015  . PND (post-nasal drip) 12/29/2015  . Diabetes mellitus type 2 without retinopathy ([place]) 07/15/2015  . Sepsis due to pneumonia ([place]) 07/15/2015  . GERD (gastroesophageal reflux disease) 05/08/2015  . Left bundle branch block 07/31/2014  . Asthma exacerbation 01/18/2014  . Abdominal pain, other specified site 01/19/2013  . Nonspecific abnormal electrocardiogram (ECG) - intermittent BBB 12/01/2012  . HTN (hypertension) 11/30/2012  . HLD (hyperlipidemia) 11/30/2012  . Chest pain 11/30/2012  . PAF - with RVR, converted sponateously 11/30/2012  . Hypothyroid 11/30/2012  . Arthritis, degenerative  02/27/2010  . Type 2 diabetes mellitus ([place]) 02/27/2010  . Adult hypothyroidism 02/27/2010  . Essential (primary) hypertension 02/27/2010  . Gastric catarrh 02/27/2010  . Allergic rhinitis 05/24/2009  . Candidiasis of skin and nail 05/24/2009  . Bone/cartilage disorder 03/15/2009  . Pruritic dermatitis 12/02/2008  . Clinical depression 11/03/2008  . Anxiety state 11/04/2007  . Atherosclerosis of coronary artery 10/16/2007  . Diabetes mellitus type 2,  controlled ([place]) 07/08/2007  . Anxiety 07/08/2007  . Coronary atherosclerosis 07/08/2007  . Asthmatic bronchitis, mild intermittent, uncomplicated [id number]  . Seasonal and perennial allergic rhinitis 07/08/2007  . ARTHRITIS 07/08/2007    Past Surgical History:  Procedure Laterality Date  . APPENDECTOMY  1947  . Bladder Tack  1970   Dr Rosana Hoes  . BREAST LUMPECTOMY  1970   Left, Dr Leandrew Koyanagi  . DILATION AND CURETTAGE OF UTERUS  1960  . LAPAROSCOPY N/A 01/19/2016   Procedure: LAPAROSCOPY DIAGNOSTIC;  Surgeon: Greer Pickerel, MD;  Location: [facility];  Service: General;  Laterality: N/A;  . LYSIS OF ADHESION N/A 01/19/2016   Procedure: LYSIS OF ADHESION;  Surgeon: Greer Pickerel, MD;  Location: [facility];  Service: General;  Laterality: N/A;  . NASAL SINUS SURGERY  1980   Left; for chronic sinusitis  . REVISION TOTAL KNEE ARTHROPLASTY  2005   Dr Alvan Dame Right knee  . VESICOVAGINAL FISTULA CLOSURE W/ TAH  1971   Dr Mallie Mussel    OB History    No data available       Home Medications    Prior to Admission medications   Medication Sig Start Date End Date Taking? Authorizing Provider  acetaminophen-codeine (TYLENOL #3) 300-30 MG tablet Take 1 tablet by mouth every 4 (four) hours as needed for moderate pain.   Yes Historical Provider, MD  albuterol (PROVENTIL HFA;VENTOLIN HFA) 108 (90 Base) MCG/ACT inhaler Inhale 2 puffs into the lungs every 6 (six) hours as needed for wheezing or shortness of breath.   Yes Historical Provider, MD  albuterol (PROVENTIL) (2.5 MG/3ML) 0.083% nebulizer solution Take 3 mLs (2.5 mg total) by nebulization every 6 (six) hours as needed for wheezing. 05/02/15  Yes Deneise Lever, MD  ANORO ELLIPTA 62.5-25 MCG/INH AEPB USE 1 PUFF DAILY 04/26/16  Yes Deneise Lever, MD  aspirin EC 81 MG tablet Take 81 mg by mouth at bedtime.    Yes Historical Provider, MD  azelastine (ASTELIN) 137 MCG/SPRAY nasal spray Place 1 spray into the nose 2 (two) times daily. Use in each nostril as directed   Yes  Historical Provider, MD  beclomethasone (QVAR) 40 MCG/ACT inhaler Inhale 2 puffs into the lungs 2 (two) times daily. 05/07/16  Yes Tammy S Parrett, NP  clopidogrel (PLAVIX) 75 MG tablet Take 75 mg by mouth every evening.    Yes Historical Provider, MD  clorazepate (TRANXENE) 15 MG tablet Take 15 mg by mouth at bedtime. anxiety   Yes Historical Provider, MD  docusate sodium (COLACE) 100 MG capsule Take 100 mg by mouth daily.   Yes Historical Provider, MD  furosemide (LASIX) 20 MG tablet Take 20 mg by mouth daily.   Yes Historical Provider, MD  glimepiride (AMARYL) 1 MG tablet Take 1 mg by mouth daily before breakfast.   Yes Historical Provider, MD  loperamide (IMODIUM A-D) 2 MG tablet Take 2 mg by mouth as needed for diarrhea or loose stools.    Yes Historical Provider, MD  LORazepam (ATIVAN) 1 MG tablet Take 1 mg by mouth every 8 (eight) hours as  needed for anxiety.   Yes Historical Provider, MD  metoprolol succinate (TOPROL-XL) 50 MG 24 hr tablet Take 1.5 tablet in the morning and 1 tablet in the evening 06/15/16  Yes Troy Sine, MD  Multiple Vitamin (MULTIVITAMIN WITH MINERALS) TABS tablet Take 1 tablet by mouth daily.   Yes Historical Provider, MD  nitroGLYCERIN (NITROSTAT) 0.4 MG SL tablet Place 0.4 mg under the tongue every 5 (five) minutes as needed. For chest pain   Yes Historical Provider, MD  omeprazole (PRILOSEC) 20 MG capsule Take 20 mg by mouth 2 (two) times daily before a meal.    Yes Historical Provider, MD  oxyCODONE (OXY IR/ROXICODONE) 5 MG immediate release tablet Take 1-1.5 tablets (5-7.5 mg total) by mouth every 4 (four) hours as needed for moderate pain, severe pain or breakthrough pain. 02/02/16  Yes Theodis Blaze, MD  pantoprazole (PROTONIX) 40 MG tablet Take 40 mg by mouth daily.   Yes Historical Provider, MD  polyethylene glycol (MIRALAX / GLYCOLAX) packet Take 17 g by mouth 2 (two) times daily.   Yes Historical Provider, MD  QUEtiapine (SEROQUEL) 25 MG tablet Take 50 mg by  mouth daily with breakfast.   Yes Historical Provider, MD  rosuvastatin (CRESTOR) 20 MG tablet Take 20 mg by mouth daily.   Yes Historical Provider, MD  saccharomyces boulardii (FLORASTOR) 250 MG capsule Take 1 capsule (250 mg total) by mouth 2 (two) times daily. 02/02/16  Yes Theodis Blaze, MD  tamsulosin (FLOMAX) 0.4 MG CAPS capsule Take 1 capsule (0.4 mg total) by mouth daily after breakfast. 02/02/16  Yes Theodis Blaze, MD  telmisartan (MICARDIS) 40 MG tablet Take 40 mg by mouth daily.   Yes Historical Provider, MD  topiramate (TOPAMAX) 25 MG tablet Take 25 mg by mouth at bedtime.   Yes Historical Provider, MD  ZETIA 10 MG tablet Take 10 mg by mouth daily. 08/15/15  Yes Historical Provider, MD    Family History Family History  Problem Relation Age of Onset  . Diabetes Mother   . Heart disease Mother   . Diabetes Father   . Parkinson's disease Father   . Heart disease Father   . Asthma Father   . Heart disease Brother   . Diabetes Brother   . Diabetes Brother   . Diabetes Brother     Social History Social History  Substance Use Topics  . Smoking status: Never Smoker  . Smokeless tobacco: Never Used  . Alcohol use No     Allergies   Patient has no known allergies.   Review of Systems Review of Systems  Constitutional:       Per HPI, otherwise negative  HENT:       Per HPI, otherwise negative  Respiratory:       Per HPI, otherwise negative  Cardiovascular:       Per HPI, otherwise negative  Gastrointestinal: Negative for vomiting.  Endocrine:       Negative aside from HPI  Genitourinary:       Neg aside from HPI   Musculoskeletal:       Per HPI, otherwise negative  Skin: Negative.   Neurological: Positive for headaches. Negative for syncope.     Physical Exam Updated Vital Signs BP 139/72   Pulse 77   Resp 16   Ht 5\' 1"  (1.549 m)   Wt 175 lb (79.4 kg)   SpO2 95%   BMI 33.07 kg/m   Physical Exam  Constitutional: She is oriented  to person, place,  and time. She appears well-developed and well-nourished. No distress.  HENT:  Head: Normocephalic and atraumatic.  Eyes: Conjunctivae and EOM are normal.  Cardiovascular: Normal rate and regular rhythm.   Pulmonary/Chest: Effort normal and breath sounds normal. No stridor. No respiratory distress.  Abdominal: She exhibits no distension.  Musculoskeletal: She exhibits no edema.  Neurological: She is alert and oriented to person, place, and time. She displays atrophy. She displays no tremor. No cranial nerve deficit. She exhibits normal muscle tone. She displays no seizure activity. Coordination normal.  Skin: Skin is warm and dry.  Psychiatric: She has a normal mood and affect.  Nursing note and vitals reviewed.    ED Treatments / Results  Labs (all labs ordered are listed, but only abnormal results are displayed) Labs Reviewed  COMPREHENSIVE METABOLIC PANEL - Abnormal; Notable for the following:       Result Value   Glucose, Bld 109 (*)    BUN 26 (*)    GFR calc non Af Amer 57 (*)    All other components within normal limits  CBC WITH DIFFERENTIAL/PLATELET - Abnormal; Notable for the following:    Hemoglobin 11.6 (*)    RDW 15.8 (*)    All other components within normal limits  URINALYSIS, ROUTINE W REFLEX MICROSCOPIC (NOT AT [facility]) - Abnormal; Notable for the following:    Leukocytes, UA SMALL (*)    All other components within normal limits  URINE MICROSCOPIC-ADD ON - Abnormal; Notable for the following:    Squamous Epithelial / LPF 0-5 (*)    Bacteria, UA RARE (*)    All other components within normal limits  TROPONIN I    EKG  EKG Interpretation  Date/Time:  Wednesday July 18 2016 20:18:06 EST Ventricular Rate:  82 PR Interval:    QRS Duration: 155 QT Interval:  438 QTC Calculation: 512 R Axis:   19 Text Interpretation:  Sinus rhythm Left bundle branch block Abnormal ekg Confirmed by Carmin Muskrat  MD [phone]) on 07/18/2016 8:33:14 PM       Radiology Ct  Head Wo Contrast  Result Date: 07/18/2016 CLINICAL DATA:  Acute onset of headache, dizziness and nausea. Initial encounter. EXAM: CT HEAD WITHOUT CONTRAST TECHNIQUE: Contiguous axial images were obtained from the base of the skull through the vertex without intravenous contrast. COMPARISON:  CT of the head performed 09/04/2011 FINDINGS: Brain: No evidence of acute infarction, hemorrhage, hydrocephalus, extra-axial collection or mass lesion/mass effect. Prominence of the ventricles and sulci reflects moderate cortical volume loss. Mild cerebellar atrophy is noted. Scattered periventricular and subcortical white matter change likely reflects small vessel ischemic microangiopathy. A chronic lacunar infarct is noted at the right basal ganglia. The brainstem and fourth ventricle are within normal limits. The cerebral hemispheres demonstrate grossly normal gray-white differentiation. No mass effect or midline shift is seen. Vascular: No hyperdense vessel or unexpected calcification. Skull: There is no evidence of fracture; visualized osseous structures are unremarkable in appearance. Sinuses/Orbits: The orbits are within normal limits. The paranasal sinuses and mastoid air cells are well-aerated. Other: No significant soft tissue abnormalities are seen. IMPRESSION: 1. No acute intracranial pathology seen on CT. 2. Moderate cortical volume loss and scattered small vessel ischemic microangiopathy. Chronic lacunar infarct at the right basal ganglia. Electronically Signed   By: Garald Balding M.D.   On: 07/18/2016 21:51    Procedures Procedures (including critical care time)  Medications Ordered in ED Medications  acetaminophen (TYLENOL) tablet 500 mg (500 mg Oral  Given 07/18/16 2021)     Initial Impression / Assessment and Plan / ED Course  I have reviewed the triage vital signs and the nursing notes.  Pertinent labs & imaging results that were available during my care of the patient were reviewed by me and  considered in my medical decision making (see chart for details).  Clinical Course     On repeat exam the patient is awake and alert in no distress per Blood pressure now 137/91. We had a lengthy conversation about all findings, reassuring results, patient will be discharged with slight increase in her blood pressure medication, follow-up with cardiology. Absent ongoing neurologic deficits, with resolution of headache, and improvement of blood pressure, there is low suspicion for occult stroke, and no evidence for end organ effects, though the patient did present with hypertensive urgency, as well as headache.   Final Clinical Impressions(s) / ED Diagnoses   Final diagnoses:  Bad headache  Hypertensive urgency      Carmin Muskrat, MD 07/18/16 2342

## 2016-07-19 ENCOUNTER — Telehealth: Payer: Self-pay | Admitting: Cardiovascular Disease

## 2016-07-19 NOTE — Telephone Encounter (Signed)
Spoke to son. Listed DPR.   As noted, patient seen in ED, given advice to up metoprolol dose from 1 50mg  tablet in evening to 1.5 tablets. (she was already moved up on AM dose from 1-1.5 tablets when last seen by Dr. Claiborne Billings) I advised to comply w the recommended increase, but monitor BP trends at home and note if concerns for it running too high or too low. He also asked about TSH level drawn Monday - this was for follow up on her levothyroxine dosing. Aware at 1st review looks like this is normal, will need Dr. Evette Georges note to review and advise on any further adjustments to meds.  Recommended to keep f/u appt in January as scheduled, will see if APP visit recommended in interim.  He voiced understanding & thanks.

## 2016-07-19 NOTE — Telephone Encounter (Signed)
Pt was seen in [facility] last night with headache and extremely high blood pressure. They did blood work,ekg and CT Head and did not find anything. The ER doctor suggested that she would increase her dose of Metoprolol in the evening.If Dr Claiborne Billings have any suggestions and the son wants to know if increasing the medicine was ok.

## 2016-07-30 ENCOUNTER — Other Ambulatory Visit: Payer: Self-pay | Admitting: Internal Medicine

## 2016-08-02 ENCOUNTER — Encounter: Payer: Self-pay | Admitting: *Deleted

## 2016-08-27 ENCOUNTER — Encounter: Payer: Self-pay | Admitting: Cardiovascular Disease

## 2016-08-27 ENCOUNTER — Ambulatory Visit (INDEPENDENT_AMBULATORY_CARE_PROVIDER_SITE_OTHER): Payer: Medicare Other | Admitting: Cardiovascular Disease

## 2016-08-27 VITALS — BP 146/72 | HR 76 | Ht 61.0 in | Wt 183.0 lb

## 2016-08-27 DIAGNOSIS — I447 Left bundle-branch block, unspecified: Secondary | ICD-10-CM

## 2016-08-27 DIAGNOSIS — I2510 Atherosclerotic heart disease of native coronary artery without angina pectoris: Secondary | ICD-10-CM | POA: Diagnosis not present

## 2016-08-27 DIAGNOSIS — E1159 Type 2 diabetes mellitus with other circulatory complications: Secondary | ICD-10-CM

## 2016-08-27 DIAGNOSIS — I480 Paroxysmal atrial fibrillation: Secondary | ICD-10-CM

## 2016-08-27 DIAGNOSIS — I10 Essential (primary) hypertension: Secondary | ICD-10-CM | POA: Diagnosis not present

## 2016-08-27 MED ORDER — FUROSEMIDE 20 MG PO TABS: ORAL_TABLET | ORAL | 6 refills | Status: DC

## 2016-08-27 MED ORDER — METOPROLOL SUCCINATE ER 50 MG PO TB24: ORAL_TABLET | ORAL | 6 refills | Status: DC

## 2016-08-27 NOTE — Patient Instructions (Addendum)
Your physician has recommended you make the following change in your medication:   1.) the metoprolol has been changed to 100 mg in the morning and 50 mg at night.  2.) the furosemide has been changed to 20 mg alternating with 40 mg   Your physician wants you to follow-up in: 6 months or sooner if needed. You will receive a reminder letter in the mail two months in advance. If you don't receive a letter, please call our office to schedule the follow-up appointment.  If you need a refill on your cardiac medications before your next appointment, please call your pharmacy.

## 2016-08-29 NOTE — Progress Notes (Signed)
Patient ID: LOAN OGUIN, female   DOB: [birth year], 83 y.o.   MRN: [id number]    Primary M.D.: Dr. [name] Pulmonary, M.D. : Dr. Keturah Barre  HPI: Lore Polka Luty,is a 83 y.o. female who has known coronary artery disease and presents to the office today for a 3 month office followup evaluation.  Mrs Grunden has known CAD and underwent  high-speed rotational atherectomy of a calcified LAD in the region of the very large diagonal vessel by me in 2000. She had excellent angiographic result. She been cared for by Dr. Rex Kras in the past. She has a history of hypothyroidism, hyperlipidemia, asthma, as well as hypertension. She was hospitalized last year for lower chest pain and had transient ECG changes with transient left bundle branch block, right bundle branch block. Her beta blocker dose was reduced. She was felt most likely to have GI symptoms. An upper GI did suggested the possibility of a hiatal hernia. She underwent endoscopy  and she was told of having some small polyps.  She has a history of hypertension and is on Micardis HCT 80/25, Toprol 100 mg in the morning.  She has a history of hyper lipidemia and has been on Zetia 10 mg in addition to Crestor 20 mg daily.  She has been taking Synthroid 25 g for mild hypothyroidism.  Type 2 diabetes mellitus has been treated with Clomid per minute 1 mg.  She continues to be on dual antiplatelet therapy.  Since I saw her one year ago she has had some issues with asthma.  She  recently saw Dr. Cristina Gong and was placed on probiotics.  She has required back injections into her lower spine by Dr. Nelva Bush.  She denies recurrent anginal symptoms.  She denies significant shortness of breath.    She was hospitalized in November 2016 with pneumonia and then  spent 3 weeks at [facility] for rehabilitation.  In January 2017 she developed a significant episode of bronchitis.  He has been weak.  She does note some mild shortness of breath.  Recent blood work on 09/15/2015 by  Dr. Maudie Mercury was reviewed.  Her hemoglobin was 11, hematocrit 36.1.  BUN 54, creatinine 1.4.  Fasting sugar was 162.  Cholesterol was 182 with an HDL of 61 and a calculated LDL of 61, but her triglycerides were elevated at 299.  TSH was 0.49.  Magnesium level was normal.    She underwent an echo Doppler study in March 2017 which showed an EF of 30-35%.  There was grade 1 diastolic dysfunction.  There was mild PA pressure elevation at 35 mm.  She was hospitalized from May 24 through 02/02/2016 with sepsis due to right lower lobe pneumonia with chronic bronchitis.  She developed a partial small bowel obstruction and underwent initial attempt at laparoscopic but which was converted to exploratory laparotomy with lysis of adhesions on June 1 17.  She had loose stools but C. difficile panel was negative.  She developed acute kidney injury and presented with a creatinine of 2.49, which improved.  Following her hospitalization she went to [facility] for 3 weeks and is now at home and having physical therapy at home.  He has difficulty with walking.  Her breathing has improved.  When I last saw her, she was tachycardic at rest and I recommended slight titration of her metoprolol succinate to 75 mg in the morning and 50 mg at night.  For the past several months.  Her pulse has been better  controlled.  She is unaware of palpitations.  She denies any episodes of chest pain.  She walks with a walker.  She is not had any falls.  She does have arthritis of her hip and knee which limits her walking.  She tells me that she went to the emergency room in November with a headache and her Toprol dose was changed to 75 mg twice a day.  She denies any recent headache.  She denies PND, orthopnea.  She presents for reevaluation.  Past Medical History:  Diagnosis Date  . Acute blood loss anemia   . Allergic rhinitis   . Anxiety   . Anxiety   . Asthma   . Atherosclerosis of coronary artery bypass graft of native  heart without angina pectoris   . CAD (coronary artery disease)   . Chronic pain syndrome   . Coronary artery disease   . Diabetes mellitus without complication ([place])   . Essential tremor   . GAD (generalized anxiety disorder)   . GERD (gastroesophageal reflux disease)   . H/O cardiovascular stress test 06-26-2012   compared to previous study there is no significant change; normal myocardial perfusion study. no significant ischemia demonstrated . this is a low risk scan  . H/O echocardiogram 06-26-2012   EF 55%  . History of cardiac cath 09-30-1998   cath done by Dr. Claiborne Billings, successful rotation atherectomy of the left anterior decending artert with reduction from 80 to105  . History of Doppler ultrasound 03-30-2008   neg abd. doppler for aneurysm  . History of Holter monitoring 12-22-2007   NSR with pac occs. pvc's  . HLD (hyperlipidemia)   . Hx of cardiac cath 09-26-1998   dign[facility] cath dr. Claiborne Billings to do rotoblator at a later date  . Hypertension   . Hypothyroidism   . Insomnia   . Osteoarthritis   . Osteopenia   . Partial small bowel obstruction   . Physical deconditioning   . Sepsis due to pneumonia [facility])     Past Surgical History:  Procedure Laterality Date  . APPENDECTOMY  1947  . Bladder Tack  1970   Dr Rosana Hoes  . BREAST LUMPECTOMY  1970   Left, Dr Leandrew Koyanagi  . DILATION AND CURETTAGE OF UTERUS  1960  . LAPAROSCOPY N/A 01/19/2016   Procedure: LAPAROSCOPY DIAGNOSTIC;  Surgeon: Greer Pickerel, MD;  Location: [place];  Service: General;  Laterality: N/A;  . LYSIS OF ADHESION N/A 01/19/2016   Procedure: LYSIS OF ADHESION;  Surgeon: Greer Pickerel, MD;  Location: [place];  Service: General;  Laterality: N/A;  . NASAL SINUS SURGERY  1980   Left; for chronic sinusitis  . REVISION TOTAL KNEE ARTHROPLASTY  2005   Dr Alvan Dame Right knee  . VESICOVAGINAL FISTULA CLOSURE W/ TAH  1971   Dr Mallie Mussel    No Known Allergies  Current Outpatient Prescriptions  Medication Sig Dispense Refill  .  acetaminophen-codeine (TYLENOL #3) 300-30 MG tablet Take 1 tablet by mouth every 4 (four) hours as needed for moderate pain.    Marland Kitchen albuterol (PROVENTIL HFA;VENTOLIN HFA) 108 (90 Base) MCG/ACT inhaler Inhale 2 puffs into the lungs every 6 (six) hours as needed for wheezing or shortness of breath.    Marland Kitchen albuterol (PROVENTIL) (2.5 MG/3ML) 0.083% nebulizer solution Take 3 mLs (2.5 mg total) by nebulization every 6 (six) hours as needed for wheezing. 75 mL 11  . ANORO ELLIPTA 62.5-25 MCG/INH AEPB INHALE 1 PUFF BY MOUTH EVERY DAY 180 each 0  . aspirin EC  81 MG tablet Take 81 mg by mouth at bedtime.     Marland Kitchen azelastine (ASTELIN) 137 MCG/SPRAY nasal spray Place 1 spray into the nose 2 (two) times daily. Use in each nostril as directed    . beclomethasone (QVAR) 40 MCG/ACT inhaler Inhale 2 puffs into the lungs 2 (two) times daily. 1 Inhaler 5  . clopidogrel (PLAVIX) 75 MG tablet Take 75 mg by mouth every evening.     . clorazepate (TRANXENE) 15 MG tablet Take 15 mg by mouth at bedtime. anxiety    . docusate sodium (COLACE) 100 MG capsule Take 100 mg by mouth daily.    . furosemide (LASIX) 20 MG tablet Take 1 tablet alternating with 2 tablets every other day 60 tablet 6  . glimepiride (AMARYL) 1 MG tablet Take 1 mg by mouth daily before breakfast.    . loperamide (IMODIUM A-D) 2 MG tablet Take 2 mg by mouth as needed for diarrhea or loose stools.     Marland Kitchen LORazepam (ATIVAN) 1 MG tablet Take 1 mg by mouth every 8 (eight) hours as needed for anxiety.    . metoprolol succinate (TOPROL-XL) 50 MG 24 hr tablet Take 2 tablets in the morning and 1 tablet in the evening 90 tablet 6  . Multiple Vitamin (MULTIVITAMIN WITH MINERALS) TABS tablet Take 1 tablet by mouth daily.    . nitroGLYCERIN (NITROSTAT) 0.4 MG SL tablet Place 0.4 mg under the tongue every 5 (five) minutes as needed. For chest pain    . omeprazole (PRILOSEC) 20 MG capsule Take 20 mg by mouth 2 (two) times daily before a meal.     . oxyCODONE (OXY IR/ROXICODONE)  5 MG immediate release tablet Take 1-1.5 tablets (5-7.5 mg total) by mouth every 4 (four) hours as needed for moderate pain, severe pain or breakthrough pain. 30 tablet 0  . pantoprazole (PROTONIX) 40 MG tablet Take 40 mg by mouth daily.    . polyethylene glycol (MIRALAX / GLYCOLAX) packet Take 17 g by mouth 2 (two) times daily.    . QUEtiapine (SEROQUEL) 25 MG tablet Take 50 mg by mouth daily with breakfast.    . rosuvastatin (CRESTOR) 20 MG tablet Take 20 mg by mouth daily.    Marland Kitchen saccharomyces boulardii (FLORASTOR) 250 MG capsule Take 1 capsule (250 mg total) by mouth 2 (two) times daily.    . tamsulosin (FLOMAX) 0.4 MG CAPS capsule Take 1 capsule (0.4 mg total) by mouth daily after breakfast. 30 capsule   . telmisartan (MICARDIS) 40 MG tablet Take 40 mg by mouth daily.    Marland Kitchen topiramate (TOPAMAX) 25 MG tablet Take 25 mg by mouth at bedtime.    Marland Kitchen ZETIA 10 MG tablet Take 10 mg by mouth daily.  2   No current facility-administered medications for this visit.     Socially she is widowed with one child one grandchild. She with her son. No tobacco history.  ROS General: Negative; No fevers, chills, or night sweats;  HEENT: Negative; No changes in vision or hearing, sinus congestion, difficulty swallowing Pulmonary: Positive for asthma; no shortness of breath, hemoptysis Cardiovascular: Negative; No chest pain, presyncope, syncope, palpitations GI: Negative; No nausea, vomiting, diarrhea, or abdominal pain GU: Negative; No dysuria, hematuria, or difficulty voiding Musculoskeletal: Negative; no myalgias, joint pain, or weakness Hematologic/Oncology: Negative; no easy bruising, bleeding Endocrine: Positive for hypothyroidism and type 2 diabetes mellitus Neuro: Negative; no changes in balance, headaches Skin: Negative; No rashes or skin lesions Psychiatric: Negative; No behavioral problems, depression  Sleep: Negative; No snoring, daytime sleepiness, hypersomnolence, bruxism, restless legs,  hypnogognic hallucinations, no cataplexy Other comprehensive 14 point system review is negative.    PE BP (!) 146/72   Pulse 76   Ht 5' 1"  (1.549 m)   Wt 183 lb (83 kg)   BMI 34.58 kg/m    Repeat blood pressure 150/70  Wt Readings from Last 3 Encounters:  08/27/16 183 lb (83 kg)  07/18/16 175 lb (79.4 kg)  07/04/16 177 lb 3.2 oz (80.4 kg)   General: Alert, oriented, no distress.  HEENT: Normocephalic, atraumatic. Pupils round and reactive; sclera anicteric;  Nose without nasal septal hypertrophy Mouth/Parynx benign; Mallinpatti scale 3 Neck: No JVD, no carotid bruits with normal carotid upstroke Lungs: clear to ausculatation and percussion; no wheezing or rales Heart: RRR, s1 s2 normal 1/6 systolic murmur; no diastolic murmur.  No rubs thrills or heaves. Abdomen: soft, nontender; no hepatosplenomehaly, BS+; abdominal aorta nontender and not dilated by palpation. Pulses 2+ Extremities: Trace ankle edema, no clubbing cyanosis, Homan's sign negative  Neurologic: grossly nonfocal Psychological: Normal affect and mood  ECG (independently read by me): Normal sinus rhythm with mild sinus arrhythmia.  Left bundle branch block.  Heart rate 76 bpm.  QTc interval 450 ms.  October 2017 ECG (independently read by me): Sinus rhythm at 100 bpm with left bundle branch block.  March 2017 ECG (independently read by me):  Sinus rhythm at 93 bpm with PAC.  Left bundle branch block with repolarization changes.  December 2015 ECG (independently read by me).  Normal sinus rhythm. Left Bundle branch block with repolarization changes compared to her prior tracing of one year ago.  Prior November 2014 ECG: Normal sinus rhythm at 79 beats per minute. Mild incomplete conduction delay. Normal intervals.  LABS: Blood work by Dr. [name] from September 15 2015 was reviewed in detail with the patient.  I have reviewed her hospitalization records from her admission of May 24 through  02/02/2016.  BMET  BMP Latest Ref Rng & Units 07/18/2016 06/15/2016 02/06/2016  Glucose 65 - 99 mg/dL 109(H) 84 -  BUN 6 - 20 mg/dL 26(H) 30(H) 20  Creatinine 0.44 - 1.00 mg/dL 0.87 0.93(H) 0.8  Sodium 135 - 145 mmol/L 140 143 140  Potassium 3.5 - 5.1 mmol/L 3.7 5.1 4.3  Chloride 101 - 111 mmol/L 106 109 -  CO2 22 - 32 mmol/L 26 25 -  Calcium 8.9 - 10.3 mg/dL 9.3 9.4 -   Hepatic Function Latest Ref Rng & Units 07/18/2016 06/15/2016 01/27/2016  Total Protein 6.5 - 8.1 g/dL 6.6 6.7 5.1(L)  Albumin 3.5 - 5.0 g/dL 3.6 4.1 2.0(L)  AST 15 - 41 U/L 29 21 50(H)  ALT 14 - 54 U/L 30 19 52  Alk Phosphatase 38 - 126 U/L 60 66 60  Total Bilirubin 0.3 - 1.2 mg/dL 0.4 0.2 0.5  Bilirubin, Direct 0.1 - 0.5 mg/dL - - -   CBC Latest Ref Rng & Units 07/18/2016 06/15/2016 02/13/2016  WBC 4.0 - 10.5 K/uL 5.7 5.1 5.2  Hemoglobin 12.0 - 15.0 g/dL 11.6(L) 12.3 9.6(A)  Hematocrit 36.0 - 46.0 % 36.5 38.6 32(A)  Platelets 150 - 400 K/uL 183 235 272   Lab Results  Component Value Date   MCV 83.0 07/18/2016   MCV 80.6 06/15/2016   MCV 83.0 02/02/2016   Lab Results  Component Value Date   TSH 1.49 07/16/2016   Lipid Panel     Component Value Date/Time   TRIG 175 (  H) 01/23/2016 0520     RADIOLOGY: No results found.  IMPRESSION: 1. PAF - with RVR, converted sponateously   2. Atherosclerosis of native coronary artery of native heart without angina pectoris   3. Essential hypertension   4. CAD in native artery   5. Left bundle branch block   6. Type 2 diabetes mellitus with other circulatory complication, without long-term current use of insulin (H[facility])     ASSESSMENT AND PLAN: Ms. Zilberman is an 83 years old white female who is  status post high-speed rotational atherectomy of a very calcified LAD In 2000.  She continues to be without anginal symptomatology.  She has been on chronic  antiplatelet therapy and continues to tolerate this well without bleeding.   An echo Doppler study November 2013 showed  an ejection fraction greater than 75% normal systolic function with grade 1 diastolic dysfunction. She did have moderate aortic sclerosis without stenosis. There was mild mitral regurgitation and tricuspid regurgitation. A nuclear perfusion study November 2013 revealed normal perfusion without scar or ischemia. Post-rest ejection fraction was 73%.  A follow-up echo Doppler study in March 2017.  However, showed her EF at 30-35%.  She was hospitalized for 3 weeks.  This past summer with sepsis, ammonia, and developed a small bowel obstruction for which she required surgery for lysis of adhesions.  Has undergone rehabilitation posthospitalization.  At present, she is not having significant chest pain or shortness of breath.  She walks with a walker and has difficulty walking due to knee and are pruritus of her hip.  Her resting pulse today is improved on her increased beta blocker therapy.  Since the Toprol is long-acting, I have suggested that rather than take a pill in a half twice a day.  She can take 100 mg in the morning (2 pills) and 1 pill at night.  She does have trace ankle edema and her blood pressure is mildly elevated.  I have suggested that she can increase her furosemide from 20 mg daily and alternate this every other day with 40 mg.  I will continue with her present dose of micardis 40 mg.  There is no wheezing on exam.  She continues to be on low-dose aspirin and Plavix for antiplatelet benefit.  She is diabetic on indapamide.  She will is on Crestor 20 mg and Zetia 10 mg hyperlipidemia. Time spent: 25 minutes.  Troy Sine, MD, [facility]  08/29/2016 7:07 PM

## 2016-08-29 NOTE — Telephone Encounter (Signed)
Discussed with pt at her ov 1/8

## 2016-09-08 ENCOUNTER — Emergency Department (HOSPITAL_COMMUNITY)
Admission: EM | Admit: 2016-09-08 | Discharge: 2016-09-09 | Disposition: A | Discharge status: Home / Self Care | Payer: Medicare Other | Attending: Emergency Medicine | Admitting: Emergency Medicine

## 2016-09-08 ENCOUNTER — Encounter (HOSPITAL_COMMUNITY): Payer: Self-pay | Admitting: Nurse Practitioner

## 2016-09-08 ENCOUNTER — Emergency Department (HOSPITAL_COMMUNITY): Payer: Medicare Other

## 2016-09-08 DIAGNOSIS — R05 Cough: Secondary | ICD-10-CM | POA: Insufficient documentation

## 2016-09-08 DIAGNOSIS — Z5321 Procedure and treatment not carried out due to patient leaving prior to being seen by health care provider: Secondary | ICD-10-CM | POA: Insufficient documentation

## 2016-09-08 NOTE — ED Notes (Signed)
Patient requested blood drawn with IV placement.

## 2016-09-08 NOTE — ED Triage Notes (Signed)
Pt c/o fever, cough, myalgia, nausea and diarrhea. Also concerned she might have PNA. Denies any other symptoms.

## 2016-09-08 NOTE — ED Notes (Signed)
Pt called from lobby with no response x2 

## 2016-09-10 DIAGNOSIS — E039 Hypothyroidism, unspecified: Secondary | ICD-10-CM | POA: Diagnosis not present

## 2016-09-10 DIAGNOSIS — E119 Type 2 diabetes mellitus without complications: Secondary | ICD-10-CM | POA: Diagnosis not present

## 2016-09-10 DIAGNOSIS — I10 Essential (primary) hypertension: Secondary | ICD-10-CM | POA: Diagnosis not present

## 2016-09-10 DIAGNOSIS — E7800 Pure hypercholesterolemia, unspecified: Secondary | ICD-10-CM | POA: Diagnosis not present

## 2016-09-14 DIAGNOSIS — M25562 Pain in left knee: Secondary | ICD-10-CM | POA: Diagnosis not present

## 2016-09-14 DIAGNOSIS — M1712 Unilateral primary osteoarthritis, left knee: Secondary | ICD-10-CM | POA: Diagnosis not present

## 2016-09-14 DIAGNOSIS — G8929 Other chronic pain: Secondary | ICD-10-CM | POA: Diagnosis not present

## 2016-09-14 DIAGNOSIS — L603 Nail dystrophy: Secondary | ICD-10-CM | POA: Diagnosis not present

## 2016-09-14 DIAGNOSIS — I739 Peripheral vascular disease, unspecified: Secondary | ICD-10-CM | POA: Diagnosis not present

## 2016-09-14 DIAGNOSIS — E1151 Type 2 diabetes mellitus with diabetic peripheral angiopathy without gangrene: Secondary | ICD-10-CM | POA: Diagnosis not present

## 2016-09-17 DIAGNOSIS — I429 Cardiomyopathy, unspecified: Secondary | ICD-10-CM | POA: Diagnosis not present

## 2016-09-17 DIAGNOSIS — E039 Hypothyroidism, unspecified: Secondary | ICD-10-CM | POA: Diagnosis not present

## 2016-09-17 DIAGNOSIS — E119 Type 2 diabetes mellitus without complications: Secondary | ICD-10-CM | POA: Diagnosis not present

## 2016-09-17 DIAGNOSIS — I10 Essential (primary) hypertension: Secondary | ICD-10-CM | POA: Diagnosis not present

## 2016-09-17 DIAGNOSIS — Z0000 Encounter for general adult medical examination without abnormal findings: Secondary | ICD-10-CM | POA: Diagnosis not present

## 2016-10-25 DIAGNOSIS — Z8719 Personal history of other diseases of the digestive system: Secondary | ICD-10-CM | POA: Diagnosis not present

## 2016-10-25 DIAGNOSIS — R1031 Right lower quadrant pain: Secondary | ICD-10-CM | POA: Diagnosis not present

## 2016-10-26 ENCOUNTER — Other Ambulatory Visit: Payer: Self-pay | Admitting: Adult Health

## 2016-10-26 ENCOUNTER — Other Ambulatory Visit: Payer: Self-pay | Admitting: General Surgery

## 2016-10-26 DIAGNOSIS — R1031 Right lower quadrant pain: Secondary | ICD-10-CM

## 2016-10-26 MED ORDER — BECLOMETHASONE DIPROP HFA 40 MCG/ACT IN AERB: 2.0000 | INHALATION_SPRAY | Freq: Two times a day (BID) | RESPIRATORY_TRACT | 5 refills | Status: DC

## 2016-10-26 NOTE — Telephone Encounter (Signed)
Received fax from [place] to change to [place] per TP Rx sent Will sign off

## 2016-10-30 ENCOUNTER — Other Ambulatory Visit: Payer: Self-pay | Admitting: Internal Medicine

## 2016-11-01 ENCOUNTER — Other Ambulatory Visit: Payer: Self-pay

## 2016-11-01 DIAGNOSIS — Z8701 Personal history of pneumonia (recurrent): Secondary | ICD-10-CM | POA: Diagnosis not present

## 2016-11-01 DIAGNOSIS — J310 Chronic rhinitis: Secondary | ICD-10-CM | POA: Diagnosis not present

## 2016-11-01 DIAGNOSIS — Z8719 Personal history of other diseases of the digestive system: Secondary | ICD-10-CM | POA: Diagnosis not present

## 2016-11-01 DIAGNOSIS — R51 Headache: Secondary | ICD-10-CM | POA: Diagnosis not present

## 2016-11-01 DIAGNOSIS — K219 Gastro-esophageal reflux disease without esophagitis: Secondary | ICD-10-CM | POA: Diagnosis not present

## 2016-11-02 ENCOUNTER — Ambulatory Visit
Admission: RE | Admit: 2016-11-02 | Discharge: 2016-11-02 | Disposition: A | Discharge status: Home / Self Care | Payer: Medicare Other | Source: Ambulatory Visit | Attending: General Surgery | Admitting: General Surgery

## 2016-11-02 DIAGNOSIS — R1031 Right lower quadrant pain: Secondary | ICD-10-CM | POA: Diagnosis not present

## 2016-11-02 MED ORDER — IOPAMIDOL (ISOVUE-300) INJECTION 61%
100.0000 mL | Freq: Once | INTRAVENOUS | Status: AC | PRN
  Administered 2016-11-02: 100 mL via INTRAVENOUS

## 2016-12-07 DIAGNOSIS — I739 Peripheral vascular disease, unspecified: Secondary | ICD-10-CM | POA: Diagnosis not present

## 2016-12-07 DIAGNOSIS — E1151 Type 2 diabetes mellitus with diabetic peripheral angiopathy without gangrene: Secondary | ICD-10-CM | POA: Diagnosis not present

## 2016-12-07 DIAGNOSIS — L84 Corns and callosities: Secondary | ICD-10-CM | POA: Diagnosis not present

## 2016-12-07 DIAGNOSIS — L603 Nail dystrophy: Secondary | ICD-10-CM | POA: Diagnosis not present

## 2016-12-19 DIAGNOSIS — L84 Corns and callosities: Secondary | ICD-10-CM | POA: Diagnosis not present

## 2016-12-24 DIAGNOSIS — E119 Type 2 diabetes mellitus without complications: Secondary | ICD-10-CM | POA: Diagnosis not present

## 2016-12-26 DIAGNOSIS — G8929 Other chronic pain: Secondary | ICD-10-CM | POA: Diagnosis not present

## 2016-12-26 DIAGNOSIS — M1712 Unilateral primary osteoarthritis, left knee: Secondary | ICD-10-CM | POA: Diagnosis not present

## 2016-12-26 DIAGNOSIS — M25562 Pain in left knee: Secondary | ICD-10-CM | POA: Diagnosis not present

## 2017-01-01 ENCOUNTER — Encounter: Payer: Self-pay | Admitting: Internal Medicine

## 2017-01-01 ENCOUNTER — Ambulatory Visit (INDEPENDENT_AMBULATORY_CARE_PROVIDER_SITE_OTHER): Payer: Medicare Other | Admitting: Internal Medicine

## 2017-01-01 DIAGNOSIS — I2510 Atherosclerotic heart disease of native coronary artery without angina pectoris: Secondary | ICD-10-CM

## 2017-01-01 DIAGNOSIS — J3089 Other allergic rhinitis: Secondary | ICD-10-CM | POA: Diagnosis not present

## 2017-01-01 DIAGNOSIS — J302 Other seasonal allergic rhinitis: Secondary | ICD-10-CM

## 2017-01-01 DIAGNOSIS — J4520 Mild intermittent asthma, uncomplicated: Secondary | ICD-10-CM

## 2017-01-01 NOTE — Progress Notes (Signed)
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 Air-trapping with increased residual volume. Diffusion mildly reduced. FVC 1.61/76%, 11.05/77%, FEV1/FVC 0.65, FEF 25-75% 0.56/33%. RV 134%, DLCO 76%.  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 LOV-NP-05/07/2016-acute visit for exacerbation of asthmatic bronchitis treated with Omnicef, prednisone taper, resumption of Qvar and continuation of Anoro. Now probably at baseline. She never feels strong or particularly well but denies chest pain, significant cough or wheeze, palpitation, discolored sputum.  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 States she has been bothered with allergies, congestion, and post-nasal drip. States she felt a "knot on left of neck" that was sore. Hasn't felt it since then.  She reports "misery"-same old postnasal drip. No recent wheezing and no acute events  ROS-see HPI Constitutional:   No-   weight loss, night sweats, fevers, chills, fatigue, lassitude. HEENT:   No-  headaches, difficulty swallowing, tooth/dental problems, sore throat,       No-  sneezing, itching, ear ache, +nasal congestion, +post nasal drip,  CV:  No-   chest pain, orthopnea, PND, swelling in lower extremities, anasarca,                                                        dizziness, palpitations Resp:+  shortness of breath with exertion or at rest.              No-   productive cough,  No non-productive cough,  No- coughing up of blood.              No-   change in color of mucus.  + wheezing.   Skin: No-   rash or lesions. GI:  No-   heartburn, indigestion, abdominal pain, nausea, vomiting,  GU:  MS:  No-   joint pain or swelling.   Neuro-     nothing unusual Psych:  No- change in mood or affect. No depression or anxiety.  No memory loss.  OBJ- Physical Exam General- Alert, Oriented, Affect-appropriate, Distress- none acute, + obese, + elderly Skin- rash-none, lesions- none, excoriation- none Lymphadenopathy- none Head- atraumatic            Eyes- Gross vision intact, PERRLA, conjunctivae and secretions clear            Ears- Hearing, canals-normal            Nose- Clear, no-Septal dev, mucus, polyps, erosion, perforation             Throat- Mallampati III , mucosa clear , drainage- none, tonsils- atrophic, + bad teeth-some extractions Neck- flexible , trachea midline, no stridor , thyroid nl, carotid no bruit Chest - symmetrical excursion , unlabored           Heart/CV- RRR , no murmur heard , no gallop  , no rub, nl s1 s2                           -  JVD- none , edema- none, stasis changes- none, varices- none           Lung- unlabored, wheeze-none, cough-none, dullness-none, rub- none.            Chest wall-  Abd-  Br/ Gen/ Rectal- Not done, not indicated Extrem- cyanosis- none, clubbing, none, atrophy- none, strength- nl. + Walker Neuro- grossly intact to observation

## 2017-01-01 NOTE — Patient Instructions (Signed)
For the allergic nasal drainage, suggest otc Claritin/ loratadine as an antihistamine.  I suspect the sore lump that came and went on the side of our neck was from a minor infection irritating a lymph node- possibly from a tooth. Show it to Dr Maudie Mercury if it comes back.

## 2017-01-20 NOTE — Assessment & Plan Note (Signed)
Relatively little cough or wheeze this spring. We discussed medications. No changes required.

## 2017-01-20 NOTE — Assessment & Plan Note (Signed)
More difficult spring weather for her. At age 83 I'm not convinced environmental allergies are the major problem. We did discuss conservative use of nonsedating antihistamines and Flonase.

## 2017-01-23 DIAGNOSIS — E7800 Pure hypercholesterolemia, unspecified: Secondary | ICD-10-CM | POA: Diagnosis not present

## 2017-01-23 DIAGNOSIS — I10 Essential (primary) hypertension: Secondary | ICD-10-CM | POA: Diagnosis not present

## 2017-01-23 DIAGNOSIS — Z79899 Other long term (current) drug therapy: Secondary | ICD-10-CM | POA: Diagnosis not present

## 2017-01-23 DIAGNOSIS — E119 Type 2 diabetes mellitus without complications: Secondary | ICD-10-CM | POA: Diagnosis not present

## 2017-01-28 ENCOUNTER — Other Ambulatory Visit: Payer: Self-pay | Admitting: Internal Medicine

## 2017-02-25 DIAGNOSIS — E1151 Type 2 diabetes mellitus with diabetic peripheral angiopathy without gangrene: Secondary | ICD-10-CM | POA: Diagnosis not present

## 2017-02-25 DIAGNOSIS — L84 Corns and callosities: Secondary | ICD-10-CM | POA: Diagnosis not present

## 2017-02-25 DIAGNOSIS — I739 Peripheral vascular disease, unspecified: Secondary | ICD-10-CM | POA: Diagnosis not present

## 2017-02-25 DIAGNOSIS — L603 Nail dystrophy: Secondary | ICD-10-CM | POA: Diagnosis not present

## 2017-02-26 ENCOUNTER — Ambulatory Visit (INDEPENDENT_AMBULATORY_CARE_PROVIDER_SITE_OTHER): Payer: Medicare Other | Admitting: Cardiovascular Disease

## 2017-02-26 ENCOUNTER — Encounter: Payer: Self-pay | Admitting: Cardiovascular Disease

## 2017-02-26 VITALS — BP 150/68 | HR 92 | Ht 61.0 in | Wt 184.4 lb

## 2017-02-26 DIAGNOSIS — I10 Essential (primary) hypertension: Secondary | ICD-10-CM | POA: Diagnosis not present

## 2017-02-26 DIAGNOSIS — I480 Paroxysmal atrial fibrillation: Secondary | ICD-10-CM

## 2017-02-26 DIAGNOSIS — I2510 Atherosclerotic heart disease of native coronary artery without angina pectoris: Secondary | ICD-10-CM | POA: Diagnosis not present

## 2017-02-26 DIAGNOSIS — I447 Left bundle-branch block, unspecified: Secondary | ICD-10-CM

## 2017-02-26 NOTE — Patient Instructions (Signed)
Your physician wants you to follow-up in: 6 months or sooner if needed. You will receive a reminder letter in the mail two months in advance. If you don't receive a letter, please call our office to schedule the follow-up appointment.   If you need a refill on your cardiac medications before your next appointment, please call your pharmacy. 

## 2017-02-26 NOTE — Progress Notes (Signed)
Patient ID: [name], female   DOB: 22-Dec-1924, 83 y.o.   MRN: [id number]    Primary M.D.: Dr. [name] Pulmonary, M.D. : Dr. Keturah Barre  HPI: [name],is a 83 y.o. female who has known coronary artery disease and presents to the office today for a 6 month office followup evaluation.  [name] has known CAD and underwent  high-speed rotational atherectomy of a calcified LAD in the region of the very large diagonal vessel by me in 2000. She had excellent angiographic result. She been cared for by Dr. Rex Kras in the past. She has a history of hypothyroidism, hyperlipidemia, asthma, as well as hypertension. She was hospitalized last year for lower chest pain and had transient ECG changes with transient left bundle branch block, right bundle branch block. Her beta blocker dose was reduced. She was felt most likely to have GI symptoms. An upper GI did suggested the possibility of a hiatal hernia. She underwent endoscopy  and she was told of having some small polyps.  She has a history of hypertension and is on Micardis HCT 80/25, Toprol 100 mg in the morning.  She has a history of hyper lipidemia and has been on Zetia 10 mg in addition to Crestor 20 mg daily.  She has been taking Synthroid 25 g for mild hypothyroidism.  Type 2 diabetes mellitus has been treated with Clomid per minute 1 mg.  She continues to be on dual antiplatelet therapy.  Since I saw her one year ago she has had some issues with asthma.  She  recently saw Dr. Cristina Gong and was placed on probiotics.  She has required back injections into her lower spine by Dr. Nelva Bush.  She denies recurrent anginal symptoms.  She denies significant shortness of breath.    She was hospitalized in November 2016 with pneumonia and then  spent 3 weeks at [facility] for rehabilitation.  In January 2017 she developed a significant episode of bronchitis.  He has been weak.  She does note some mild shortness of breath.  Recent blood work on 09/15/2015 by  Dr. Maudie Mercury was reviewed.  Her hemoglobin was 11, hematocrit 36.1.  BUN 54, creatinine 1.4.  Fasting sugar was 162.  Cholesterol was 182 with an HDL of 61 and a calculated LDL of 61, but her triglycerides were elevated at 299.  TSH was 0.49.  Magnesium level was normal.    She underwent an echo Doppler study in March 2017 which showed an EF of 30-35%.  There was grade 1 diastolic dysfunction.  There was mild PA pressure elevation at 35 mm.  She was hospitalized from May 24 through 02/02/2016 with sepsis due to right lower lobe pneumonia with chronic bronchitis.  She developed a partial small bowel obstruction and underwent initial attempt at laparoscopic but which was converted to exploratory laparotomy with lysis of adhesions on June 1 17.  She had loose stools but C. difficile panel was negative.  She developed acute kidney injury and presented with a creatinine of 2.49, which improved.  Following her hospitalization she went to [facility] for 3 weeks and is now at home and having physical therapy at home.  He has difficulty with walking.  Her breathing has improved.  When I are in October 2017, she was tachycardic at rest and I recommended slight titration of her metoprolol succinate to 75 mg in the morning and 50 mg at night.  For the past several months.  Her pulse has been  better controlled.  She is unaware of palpitations.  She denies any episodes of chest pain.  She walks with a walker.  She is not had any falls.  She does have arthritis of her hip and knee which limits her walking.  She tells me that she went to the emergency room in November with a headache and her Toprol dose was changed to 75 mg twice a day.    Since I last saw her in January 2018.  He has felt well.  She tells me she will need to have several teeth pulled.  She has been on aspirin and Plavix.  She has been taking Toprol 35 mg in the morning and 50 Milligan grams in the evening, furosemide 40 mg alternating with 20  mg every other day in addition to my Cardis 40 mg.  3.  GERD has been controlled with Protonix.  She denies any recurrent anginal type symptoms.  She denies any recent headache.  She denies PND, orthopnea.  She presents for reevaluation.  Past Medical History:  Diagnosis Date  . Acute blood loss anemia   . Allergic rhinitis   . Anxiety   . Anxiety   . Asthma   . 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 CAD (coronary artery disease)   . Chronic pain syndrome   . Coronary artery disease   . Diabetes mellitus without complication ([place])   . Essential tremor   . GAD (generalized anxiety disorder)   . GERD (gastroesophageal reflux disease)   . H/O cardiovascular stress test 06-26-2012   compared to previous study there is no significant change; normal myocardial perfusion study. no significant ischemia demonstrated . this is a low risk scan  . H/O echocardiogram 06-26-2012   EF 55%  . History of cardiac cath 09-30-1998   cath done by Dr. Claiborne Billings, successful rotation atherectomy of the left anterior decending artert with reduction from 80 to105  . History of Doppler ultrasound 03-30-2008   neg abd. doppler for aneurysm  . History of Holter monitoring 12-22-2007   NSR with pac occs. pvc's  . HLD (hyperlipidemia)   . Hx of cardiac cath 09-26-1998   dignostic cath dr. Claiborne Billings to do rotoblator at a later date  . Hypertension   . Hypothyroidism   . Insomnia   . Osteoarthritis   . Osteopenia   . Partial small bowel obstruction ([place])   . Physical deconditioning   . Sepsis due to pneumonia [facility])     Past Surgical History:  Procedure Laterality Date  . APPENDECTOMY  1947  . Bladder Tack  1970   Dr Rosana Hoes  . BREAST LUMPECTOMY  1970   Left, Dr Leandrew Koyanagi  . DILATION AND CURETTAGE OF UTERUS  1960  . LAPAROSCOPY N/A 01/19/2016   Procedure: LAPAROSCOPY DIAGNOSTIC;  Surgeon: Greer Pickerel, MD;  Location: [place];  Service: General;  Laterality: N/A;  . LYSIS OF  ADHESION N/A 01/19/2016   Procedure: LYSIS OF ADHESION;  Surgeon: Greer Pickerel, MD;  Location: [place];  Service: General;  Laterality: N/A;  . NASAL SINUS SURGERY  1980   Left; for chronic sinusitis  . REVISION TOTAL KNEE ARTHROPLASTY  2005   Dr Alvan Dame Right knee  . VESICOVAGINAL FISTULA CLOSURE W/ TAH  1971   Dr Mallie Mussel    No Known Allergies  Current Outpatient Prescriptions  Medication Sig Dispense Refill  . acetaminophen-codeine (TYLENOL #3) 300-30 MG tablet Take 1 tablet by mouth every  4 (four) hours as needed for moderate pain.    Marland Kitchen albuterol (PROVENTIL HFA;VENTOLIN HFA) 108 (90 Base) MCG/ACT inhaler Inhale 2 puffs into the lungs every 6 (six) hours as needed for wheezing or shortness of breath.    Marland Kitchen albuterol (PROVENTIL) (2.5 MG/3ML) 0.083% nebulizer solution Take 3 mLs (2.5 mg total) by nebulization every 6 (six) hours as needed for wheezing. 75 mL 11  . ANORO ELLIPTA 62.5-25 MCG/INH AEPB INHALE 1 PUFF BY MOUTH EVERY DAY 180 each 0  . aspirin EC 81 MG tablet Take 81 mg by mouth at bedtime.     Marland Kitchen azelastine (ASTELIN) 137 MCG/SPRAY nasal spray Place 1 spray into the nose 2 (two) times daily. Use in each nostril as directed    . Beclomethasone Diprop HFA (QVAR REDIHALER) 40 MCG/ACT AERB Inhale 2 puffs into the lungs 2 (two) times daily. 1 Inhaler 5  . clopidogrel (PLAVIX) 75 MG tablet Take 75 mg by mouth every evening.     . clorazepate (TRANXENE) 15 MG tablet Take 15 mg by mouth at bedtime. anxiety    . docusate sodium (COLACE) 100 MG capsule Take 100 mg by mouth daily.    . furosemide (LASIX) 20 MG tablet Take 1 tablet alternating with 2 tablets every other day 60 tablet 6  . loperamide (IMODIUM A-D) 2 MG tablet Take 2 mg by mouth as needed for diarrhea or loose stools.     Marland Kitchen LORazepam (ATIVAN) 1 MG tablet Take 1 mg by mouth every 8 (eight) hours as needed for anxiety.    . metoprolol succinate (TOPROL-XL) 50 MG 24 hr tablet Take 2 tablets in the morning and 1 tablet in the evening 90  tablet 6  . Multiple Vitamin (MULTIVITAMIN WITH MINERALS) TABS tablet Take 1 tablet by mouth daily.    . nitroGLYCERIN (NITROSTAT) 0.4 MG SL tablet Place 0.4 mg under the tongue every 5 (five) minutes as needed. For chest pain    . omeprazole (PRILOSEC) 20 MG capsule Take 20 mg by mouth 2 (two) times daily before a meal.     . pantoprazole (PROTONIX) 40 MG tablet Take 40 mg by mouth daily.    . polyethylene glycol (MIRALAX / GLYCOLAX) packet Take 17 g by mouth 2 (two) times daily.    Marland Kitchen telmisartan (MICARDIS) 40 MG tablet Take 40 mg by mouth daily.    Marland Kitchen topiramate (TOPAMAX) 25 MG tablet Take 25 mg by mouth at bedtime.    Marland Kitchen ZETIA 10 MG tablet Take 10 mg by mouth daily.  2   No current facility-administered medications for this visit.     Socially she is widowed with one child one grandchild. She with her son. No tobacco history.  ROS General: Negative; No fevers, chills, or night sweats;  HEENT: Negative; No changes in vision or hearing, sinus congestion, difficulty swallowing Pulmonary: Positive for asthma; no shortness of breath, hemoptysis Cardiovascular: Negative; No chest pain, presyncope, syncope, palpitations GI: Negative; No nausea, vomiting, diarrhea, or abdominal pain GU: Negative; No dysuria, hematuria, or difficulty voiding Musculoskeletal: Negative; no myalgias, joint pain, or weakness Hematologic/Oncology: Negative; no easy bruising, bleeding Endocrine: Positive for hypothyroidism and type 2 diabetes mellitus Neuro: Negative; no changes in balance, headaches Skin: Negative; No rashes or skin lesions Psychiatric: Negative; No behavioral problems, depression Sleep: Negative; No snoring, daytime sleepiness, hypersomnolence, bruxism, restless legs, hypnogognic hallucinations, no cataplexy Other comprehensive 14 point system review is negative.    PE BP (!) 150/68   Pulse 92  Ht 5' 1"  (1.549 m)   Wt 184 lb 6.4 oz (83.6 kg)   BMI 34.84 kg/m    Repeat blood pressure  134/68  Wt Readings from Last 3 Encounters:  02/26/17 184 lb 6.4 oz (83.6 kg)  01/01/17 181 lb (82.1 kg)  08/27/16 183 lb (83 kg)   General: Alert, oriented, no distress.  Skin: normal turgor, no rashes, warm and dry HEENT: Normocephalic, atraumatic. Pupils equal round and reactive to light; sclera anicteric; extraocular muscles intact; Nose without nasal septal hypertrophy Mouth/Parynx benign; Mallinpatti scale 3 Neck: No JVD, no carotid bruits; normal carotid upstroke Lungs: clear to ausculatation and percussion; no wheezing or rales Chest wall: without tenderness to palpitation Heart: PMI not displaced, RRR, s1 s2 normal, 1/6 systolic murmur, no diastolic murmur, no rubs, gallops, thrills, or heaves Abdomen: soft, nontender; no hepatosplenomehaly, BS+; abdominal aorta nontender and not dilated by palpation. Back: no CVA tenderness Pulses 2+ Musculoskeletal: full range of motion, normal strength, no joint deformities Extremities: Resolution of prior edema; no clubbing cyanosis or edema, Homan's sign negative  Neurologic: grossly nonfocal; Cranial nerves grossly wnl Psychologic: Normal mood and affect   ECG (independently read by me): Normal sinus rhythm at 92 bpm.  Left bundle branch block with repolarization changes.  08/27/2016 ECG (independently read by me): Normal sinus rhythm with mild sinus arrhythmia.  Left bundle branch block.  Heart rate 76 bpm.  QTc interval 450 ms.  October 2017 ECG (independently read by me): Sinus rhythm at 100 bpm with left bundle branch block.  March 2017 ECG (independently read by me):  Sinus rhythm at 93 bpm with PAC.  Left bundle branch block with repolarization changes.  December 2015 ECG (independently read by me).  Normal sinus rhythm. Left Bundle branch block with repolarization changes compared to her prior tracing of one year ago.  Prior November 2014 ECG: Normal sinus rhythm at 79 beats per minute. Mild incomplete conduction delay. Normal  intervals.  LABS: Blood work by Dr. [name] from September 15 2015 was reviewed in detail with the patient.  I have reviewed her hospitalization records from her admission of May 24 through 02/02/2016.  BMET  BMP Latest Ref Rng & Units 07/18/2016 06/15/2016 02/06/2016  Glucose 65 - 99 mg/dL 109(H) 84 -  BUN 6 - 20 mg/dL 26(H) 30(H) 20  Creatinine 0.44 - 1.00 mg/dL 0.87 0.93(H) 0.8  Sodium 135 - 145 mmol/L 140 143 140  Potassium 3.5 - 5.1 mmol/L 3.7 5.1 4.3  Chloride 101 - 111 mmol/L 106 109 -  CO2 22 - 32 mmol/L 26 25 -  Calcium 8.9 - 10.3 mg/dL 9.3 9.4 -   Hepatic Function Latest Ref Rng & Units 07/18/2016 06/15/2016 01/27/2016  Total Protein 6.5 - 8.1 g/dL 6.6 6.7 5.1(L)  Albumin 3.5 - 5.0 g/dL 3.6 4.1 2.0(L)  AST 15 - 41 U/L 29 21 50(H)  ALT 14 - 54 U/L 30 19 52  Alk Phosphatase 38 - 126 U/L 60 66 60  Total Bilirubin 0.3 - 1.2 mg/dL 0.4 0.2 0.5  Bilirubin, Direct 0.1 - 0.5 mg/dL - - -   CBC Latest Ref Rng & Units 07/18/2016 06/15/2016 02/13/2016  WBC 4.0 - 10.5 K/uL 5.7 5.1 5.2  Hemoglobin 12.0 - 15.0 g/dL 11.6(L) 12.3 9.6(A)  Hematocrit 36.0 - 46.0 % 36.5 38.6 32(A)  Platelets 150 - 400 K/uL 183 235 272   Lab Results  Component Value Date   MCV 83.0 07/18/2016   MCV 80.6 06/15/2016  MCV 83.0 02/02/2016   Lab Results  Component Value Date   TSH 1.49 07/16/2016   Lipid Panel     Component Value Date/Time   TRIG 175 (H) 01/23/2016 0520     RADIOLOGY: No results found.  IMPRESSION: 1. Essential hypertension   2. PAF - with RVR, converted sponateously   3. CAD in native artery   4. Left bundle branch block     ASSESSMENT AND PLAN: [name] is an 83 years old white female who is  status post high-speed rotational atherectomy of a very calcified LAD In 2000.  She continues to be without anginal symptomatology.  She has been on chronic  antiplatelet therapy and continues to tolerate this well without bleeding.   An echo Doppler study November 2013 showed an  ejection fraction greater than 36% normal systolic function with grade 1 diastolic dysfunction. She did have moderate aortic sclerosis without stenosis. There was mild mitral regurgitation and tricuspid regurgitation. A nuclear perfusion study November 2013 revealed normal perfusion without scar or ischemia. Post-rest ejection fraction was 73%.  A follow-up echo Doppler study in March 2017 showed her EF at 30-35%.  Presently, her blood pressure is improved but was elevated when taken by the nurse and on repeat by me was 134/68.  She is tolerating her current dose of Toprol-XL 75 mg in the morning and 50 mg in the evening.  She is not having palpitations.  Her peripheral edema has resolved and I have recommended she change her Lasix to 20 mg daily.  She is in need to have several teeth pulled.  She will need a Holter Plavix for at least 5 days prior to that procedure and may very well.  Also need to hold her aspirin.  She has been without anginal symptomatology.  She is on Zetia for hyperlipidemia.  Dr. [name] has been checking her blood work and I will try to obtain her most recent laboratory.  At present, there is no wheezing and she takes albuterol as needed.  Her GERD is controlled with Protonix.  I will see her in 6 months for reevaluation.  Time spent: 25 minutes.  Troy Sine, MD, [facility]  02/28/2017 7:54 PM

## 2017-03-07 DIAGNOSIS — E039 Hypothyroidism, unspecified: Secondary | ICD-10-CM | POA: Diagnosis not present

## 2017-03-07 DIAGNOSIS — E119 Type 2 diabetes mellitus without complications: Secondary | ICD-10-CM | POA: Diagnosis not present

## 2017-03-07 DIAGNOSIS — M859 Disorder of bone density and structure, unspecified: Secondary | ICD-10-CM | POA: Diagnosis not present

## 2017-03-07 DIAGNOSIS — I10 Essential (primary) hypertension: Secondary | ICD-10-CM | POA: Diagnosis not present

## 2017-03-07 DIAGNOSIS — Z79899 Other long term (current) drug therapy: Secondary | ICD-10-CM | POA: Diagnosis not present

## 2017-03-07 DIAGNOSIS — Z5181 Encounter for therapeutic drug level monitoring: Secondary | ICD-10-CM | POA: Diagnosis not present

## 2017-03-07 DIAGNOSIS — M8580 Other specified disorders of bone density and structure, unspecified site: Secondary | ICD-10-CM | POA: Diagnosis not present

## 2017-03-14 DIAGNOSIS — E119 Type 2 diabetes mellitus without complications: Secondary | ICD-10-CM | POA: Diagnosis not present

## 2017-03-14 DIAGNOSIS — M25562 Pain in left knee: Secondary | ICD-10-CM | POA: Diagnosis not present

## 2017-03-14 DIAGNOSIS — E039 Hypothyroidism, unspecified: Secondary | ICD-10-CM | POA: Diagnosis not present

## 2017-03-14 DIAGNOSIS — I10 Essential (primary) hypertension: Secondary | ICD-10-CM | POA: Diagnosis not present

## 2017-03-15 DIAGNOSIS — Z5181 Encounter for therapeutic drug level monitoring: Secondary | ICD-10-CM | POA: Diagnosis not present

## 2017-04-03 ENCOUNTER — Encounter (HOSPITAL_COMMUNITY): Payer: Self-pay | Admitting: *Deleted

## 2017-04-03 ENCOUNTER — Emergency Department (HOSPITAL_COMMUNITY)
Admission: EM | Admit: 2017-04-03 | Discharge: 2017-04-03 | Disposition: A | Discharge status: Home / Self Care | Payer: Medicare Other | Attending: Emergency Medicine | Admitting: Emergency Medicine

## 2017-04-03 DIAGNOSIS — Z5321 Procedure and treatment not carried out due to patient leaving prior to being seen by health care provider: Secondary | ICD-10-CM | POA: Diagnosis not present

## 2017-04-03 DIAGNOSIS — R109 Unspecified abdominal pain: Secondary | ICD-10-CM | POA: Insufficient documentation

## 2017-04-03 DIAGNOSIS — R1031 Right lower quadrant pain: Secondary | ICD-10-CM | POA: Diagnosis not present

## 2017-04-03 DIAGNOSIS — K625 Hemorrhage of anus and rectum: Secondary | ICD-10-CM | POA: Diagnosis not present

## 2017-04-03 LAB — COMPREHENSIVE METABOLIC PANEL
ALBUMIN: 3.7 g/dL (ref 3.5–5.0)
ALT: 23 U/L (ref 14–54)
ANION GAP: 8 (ref 5–15)
AST: 23 U/L (ref 15–41)
Alkaline Phosphatase: 53 U/L (ref 38–126)
BILIRUBIN TOTAL: 0.5 mg/dL (ref 0.3–1.2)
BUN: 27 mg/dL — ABNORMAL HIGH (ref 6–20)
CO2: 26 mmol/L (ref 22–32)
Calcium: 9.6 mg/dL (ref 8.9–10.3)
Chloride: 106 mmol/L (ref 101–111)
Creatinine, Ser: 0.93 mg/dL (ref 0.44–1.00)
GFR calc non Af Amer: 53 mL/min — ABNORMAL LOW (ref >60)
GLUCOSE: 122 mg/dL — AB (ref 65–99)
POTASSIUM: 4.6 mmol/L (ref 3.5–5.1)
SODIUM: 140 mmol/L (ref 135–145)
TOTAL PROTEIN: 6.8 g/dL (ref 6.5–8.1)

## 2017-04-03 LAB — CBC
HEMATOCRIT: 40.4 % (ref 36.0–46.0)
HEMOGLOBIN: 12.5 g/dL (ref 12.0–15.0)
MCH: 26.2 pg (ref 26.0–34.0)
MCHC: 30.9 g/dL (ref 30.0–36.0)
MCV: 84.5 fL (ref 78.0–100.0)
Platelets: 218 10*3/uL (ref 150–400)
RBC: 4.78 MIL/uL (ref 3.87–5.11)
RDW: 13.6 % (ref 11.5–15.5)
WBC: 6.1 10*3/uL (ref 4.0–10.5)

## 2017-04-03 NOTE — ED Notes (Addendum)
Called pt name x3 to be roomed. No answer.

## 2017-04-03 NOTE — ED Provider Notes (Cosign Needed)
Attempted to see patient but she was not roomed. Patient did not answer from the waiting room.   Emeline General, PA-C 04/03/17 1630

## 2017-04-03 NOTE — ED Triage Notes (Signed)
PT here via [facility] for RLQ pain and black, runny stool x 3 this am.  Pt states she is on blood thinners.  VS stable.  140/80, hr 82.

## 2017-04-03 NOTE — ED Notes (Signed)
Called pt name x3 to be roomed. No answer.

## 2017-04-04 ENCOUNTER — Other Ambulatory Visit: Payer: Self-pay | Admitting: Adult Health

## 2017-04-14 ENCOUNTER — Other Ambulatory Visit: Payer: Self-pay | Admitting: Cardiovascular Disease

## 2017-04-15 NOTE — Telephone Encounter (Signed)
REFILL 

## 2017-04-23 DIAGNOSIS — R140 Abdominal distension (gaseous): Secondary | ICD-10-CM | POA: Diagnosis not present

## 2017-04-23 DIAGNOSIS — R110 Nausea: Secondary | ICD-10-CM | POA: Diagnosis not present

## 2017-04-23 DIAGNOSIS — K5901 Slow transit constipation: Secondary | ICD-10-CM | POA: Diagnosis not present

## 2017-05-07 ENCOUNTER — Other Ambulatory Visit: Payer: Self-pay | Admitting: Internal Medicine

## 2017-05-17 DIAGNOSIS — M1712 Unilateral primary osteoarthritis, left knee: Secondary | ICD-10-CM | POA: Diagnosis not present

## 2017-05-23 DIAGNOSIS — G8929 Other chronic pain: Secondary | ICD-10-CM | POA: Diagnosis not present

## 2017-05-23 DIAGNOSIS — M25562 Pain in left knee: Secondary | ICD-10-CM | POA: Diagnosis not present

## 2017-05-23 DIAGNOSIS — Z23 Encounter for immunization: Secondary | ICD-10-CM | POA: Diagnosis not present

## 2017-05-29 ENCOUNTER — Emergency Department (HOSPITAL_COMMUNITY): Payer: Medicare Other

## 2017-05-29 ENCOUNTER — Inpatient Hospital Stay (HOSPITAL_COMMUNITY)
Admission: EM | Admit: 2017-05-29 | Discharge: 2017-06-06 | DRG: 202 | Disposition: A | Discharge status: Home / Self Care | Payer: Medicare Other | Attending: Emergency Medicine | Admitting: Emergency Medicine

## 2017-05-29 ENCOUNTER — Inpatient Hospital Stay (HOSPITAL_COMMUNITY): Payer: Medicare Other

## 2017-05-29 ENCOUNTER — Encounter (HOSPITAL_COMMUNITY): Payer: Self-pay | Admitting: Internal Medicine

## 2017-05-29 DIAGNOSIS — G4700 Insomnia, unspecified: Secondary | ICD-10-CM | POA: Diagnosis present

## 2017-05-29 DIAGNOSIS — K219 Gastro-esophageal reflux disease without esophagitis: Secondary | ICD-10-CM | POA: Diagnosis not present

## 2017-05-29 DIAGNOSIS — K5900 Constipation, unspecified: Secondary | ICD-10-CM | POA: Diagnosis present

## 2017-05-29 DIAGNOSIS — Z820 Family history of epilepsy and other diseases of the nervous system: Secondary | ICD-10-CM

## 2017-05-29 DIAGNOSIS — Z8249 Family history of ischemic heart disease and other diseases of the circulatory system: Secondary | ICD-10-CM

## 2017-05-29 DIAGNOSIS — R05 Cough: Secondary | ICD-10-CM | POA: Diagnosis not present

## 2017-05-29 DIAGNOSIS — Z9889 Other specified postprocedural states: Secondary | ICD-10-CM | POA: Diagnosis not present

## 2017-05-29 DIAGNOSIS — I2510 Atherosclerotic heart disease of native coronary artery without angina pectoris: Secondary | ICD-10-CM

## 2017-05-29 DIAGNOSIS — J4520 Mild intermittent asthma, uncomplicated: Secondary | ICD-10-CM

## 2017-05-29 DIAGNOSIS — M8580 Other specified disorders of bone density and structure, unspecified site: Secondary | ICD-10-CM | POA: Diagnosis present

## 2017-05-29 DIAGNOSIS — E785 Hyperlipidemia, unspecified: Secondary | ICD-10-CM | POA: Diagnosis not present

## 2017-05-29 DIAGNOSIS — I5022 Chronic systolic (congestive) heart failure: Secondary | ICD-10-CM | POA: Diagnosis present

## 2017-05-29 DIAGNOSIS — Z9049 Acquired absence of other specified parts of digestive tract: Secondary | ICD-10-CM | POA: Diagnosis not present

## 2017-05-29 DIAGNOSIS — Z833 Family history of diabetes mellitus: Secondary | ICD-10-CM | POA: Diagnosis not present

## 2017-05-29 DIAGNOSIS — K56609 Unspecified intestinal obstruction, unspecified as to partial versus complete obstruction: Secondary | ICD-10-CM | POA: Diagnosis not present

## 2017-05-29 DIAGNOSIS — J4521 Mild intermittent asthma with (acute) exacerbation: Secondary | ICD-10-CM | POA: Diagnosis not present

## 2017-05-29 DIAGNOSIS — R9431 Abnormal electrocardiogram [ECG] [EKG]: Secondary | ICD-10-CM | POA: Diagnosis not present

## 2017-05-29 DIAGNOSIS — K5903 Drug induced constipation: Secondary | ICD-10-CM | POA: Diagnosis not present

## 2017-05-29 DIAGNOSIS — Z7982 Long term (current) use of aspirin: Secondary | ICD-10-CM

## 2017-05-29 DIAGNOSIS — Z7902 Long term (current) use of antithrombotics/antiplatelets: Secondary | ICD-10-CM

## 2017-05-29 DIAGNOSIS — R0600 Dyspnea, unspecified: Secondary | ICD-10-CM | POA: Diagnosis not present

## 2017-05-29 DIAGNOSIS — G894 Chronic pain syndrome: Secondary | ICD-10-CM | POA: Diagnosis present

## 2017-05-29 DIAGNOSIS — J9620 Acute and chronic respiratory failure, unspecified whether with hypoxia or hypercapnia: Secondary | ICD-10-CM | POA: Diagnosis present

## 2017-05-29 DIAGNOSIS — E039 Hypothyroidism, unspecified: Secondary | ICD-10-CM | POA: Diagnosis present

## 2017-05-29 DIAGNOSIS — E119 Type 2 diabetes mellitus without complications: Secondary | ICD-10-CM | POA: Diagnosis not present

## 2017-05-29 DIAGNOSIS — J9621 Acute and chronic respiratory failure with hypoxia: Secondary | ICD-10-CM

## 2017-05-29 DIAGNOSIS — I110 Hypertensive heart disease with heart failure: Secondary | ICD-10-CM | POA: Diagnosis present

## 2017-05-29 DIAGNOSIS — I10 Essential (primary) hypertension: Secondary | ICD-10-CM | POA: Diagnosis not present

## 2017-05-29 DIAGNOSIS — Z825 Family history of asthma and other chronic lower respiratory diseases: Secondary | ICD-10-CM

## 2017-05-29 DIAGNOSIS — Z66 Do not resuscitate: Secondary | ICD-10-CM | POA: Diagnosis present

## 2017-05-29 DIAGNOSIS — Z79899 Other long term (current) drug therapy: Secondary | ICD-10-CM

## 2017-05-29 DIAGNOSIS — R918 Other nonspecific abnormal finding of lung field: Secondary | ICD-10-CM | POA: Diagnosis not present

## 2017-05-29 DIAGNOSIS — J45901 Unspecified asthma with (acute) exacerbation: Secondary | ICD-10-CM | POA: Diagnosis not present

## 2017-05-29 DIAGNOSIS — R069 Unspecified abnormalities of breathing: Secondary | ICD-10-CM | POA: Diagnosis not present

## 2017-05-29 LAB — BASIC METABOLIC PANEL
Anion gap: 8 (ref 5–15)
BUN: 28 mg/dL — AB (ref 6–20)
CALCIUM: 9.4 mg/dL (ref 8.9–10.3)
CHLORIDE: 106 mmol/L (ref 101–111)
CO2: 27 mmol/L (ref 22–32)
CREATININE: 1.05 mg/dL — AB (ref 0.44–1.00)
GFR, EST AFRICAN AMERICAN: 53 mL/min — AB (ref >60)
GFR, EST NON AFRICAN AMERICAN: 45 mL/min — AB (ref >60)
Glucose, Bld: 136 mg/dL — ABNORMAL HIGH (ref 65–99)
Potassium: 4.1 mmol/L (ref 3.5–5.1)
SODIUM: 141 mmol/L (ref 135–145)

## 2017-05-29 LAB — CBC WITH DIFFERENTIAL/PLATELET
BASOS PCT: 0 %
Basophils Absolute: 0 10*3/uL (ref 0.0–0.1)
EOS ABS: 0.2 10*3/uL (ref 0.0–0.7)
EOS PCT: 3 %
HCT: 38.5 % (ref 36.0–46.0)
HEMOGLOBIN: 12.1 g/dL (ref 12.0–15.0)
Lymphocytes Relative: 28 %
Lymphs Abs: 1.4 10*3/uL (ref 0.7–4.0)
MCH: 27.1 pg (ref 26.0–34.0)
MCHC: 31.4 g/dL (ref 30.0–36.0)
MCV: 86.1 fL (ref 78.0–100.0)
MONOS PCT: 14 %
Monocytes Absolute: 0.7 10*3/uL (ref 0.1–1.0)
NEUTROS PCT: 55 %
Neutro Abs: 2.8 10*3/uL (ref 1.7–7.7)
PLATELETS: 149 10*3/uL — AB (ref 150–400)
RBC: 4.47 MIL/uL (ref 3.87–5.11)
RDW: 14.2 % (ref 11.5–15.5)
WBC: 5.1 10*3/uL (ref 4.0–10.5)

## 2017-05-29 LAB — TROPONIN I

## 2017-05-29 LAB — GLUCOSE, CAPILLARY: Glucose-Capillary: 196 mg/dL — ABNORMAL HIGH (ref 65–99)

## 2017-05-29 LAB — D-DIMER, QUANTITATIVE: D-Dimer, Quant: 0.56 ug/mL-FEU — ABNORMAL HIGH (ref 0.00–0.50)

## 2017-05-29 IMAGING — CR DG CHEST 2V
2 series · 2 of 2 positions shown · non-contrast
Comparison: 01/20/2015

CLINICAL DATA: Asthma x 1 week, cough with phlegm x 6 months

HTN,diabetes
Hx  Of coronary artery disease and cardiac [id number]
EXAM:
CHEST  2 VIEW

[w chest pa]
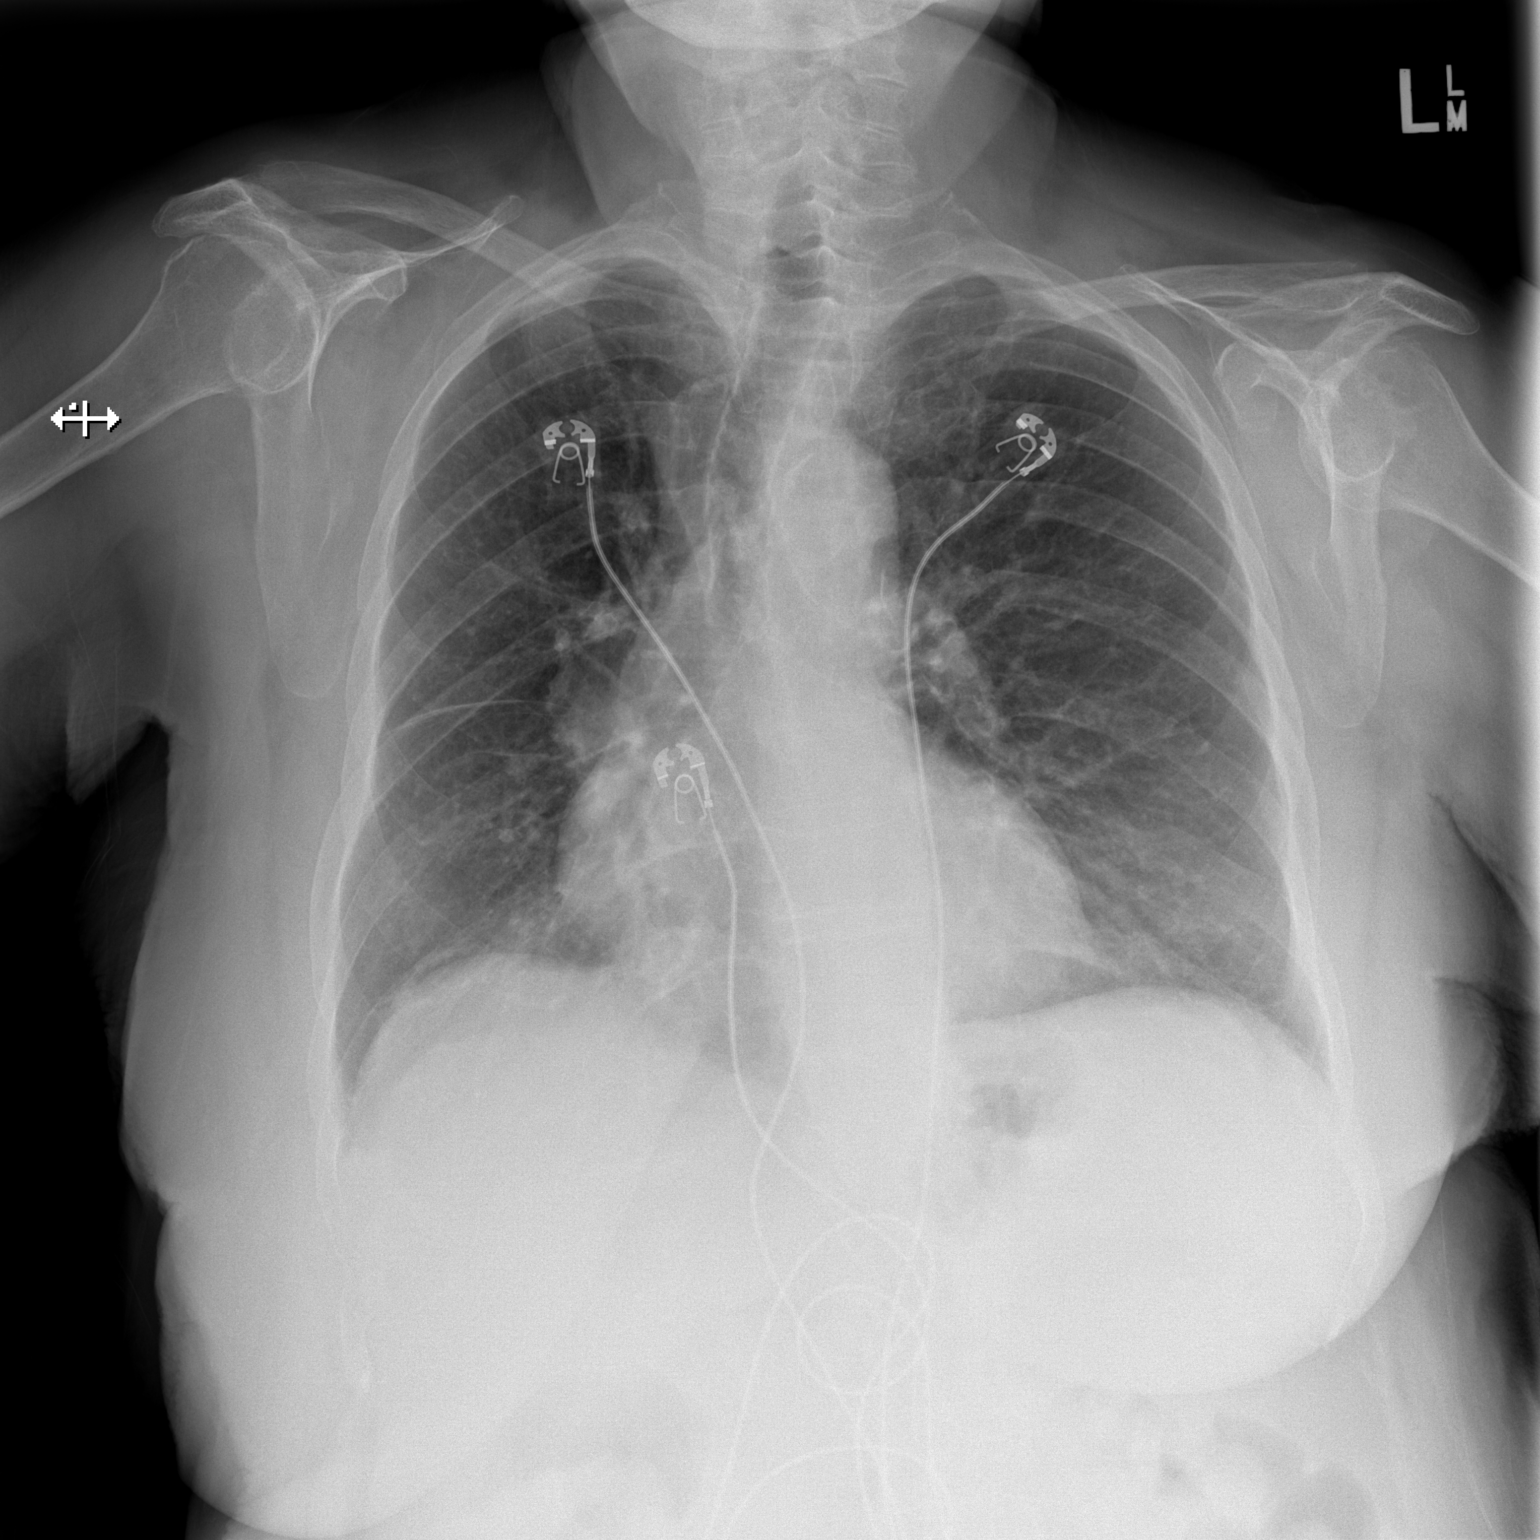

[w chest lat]
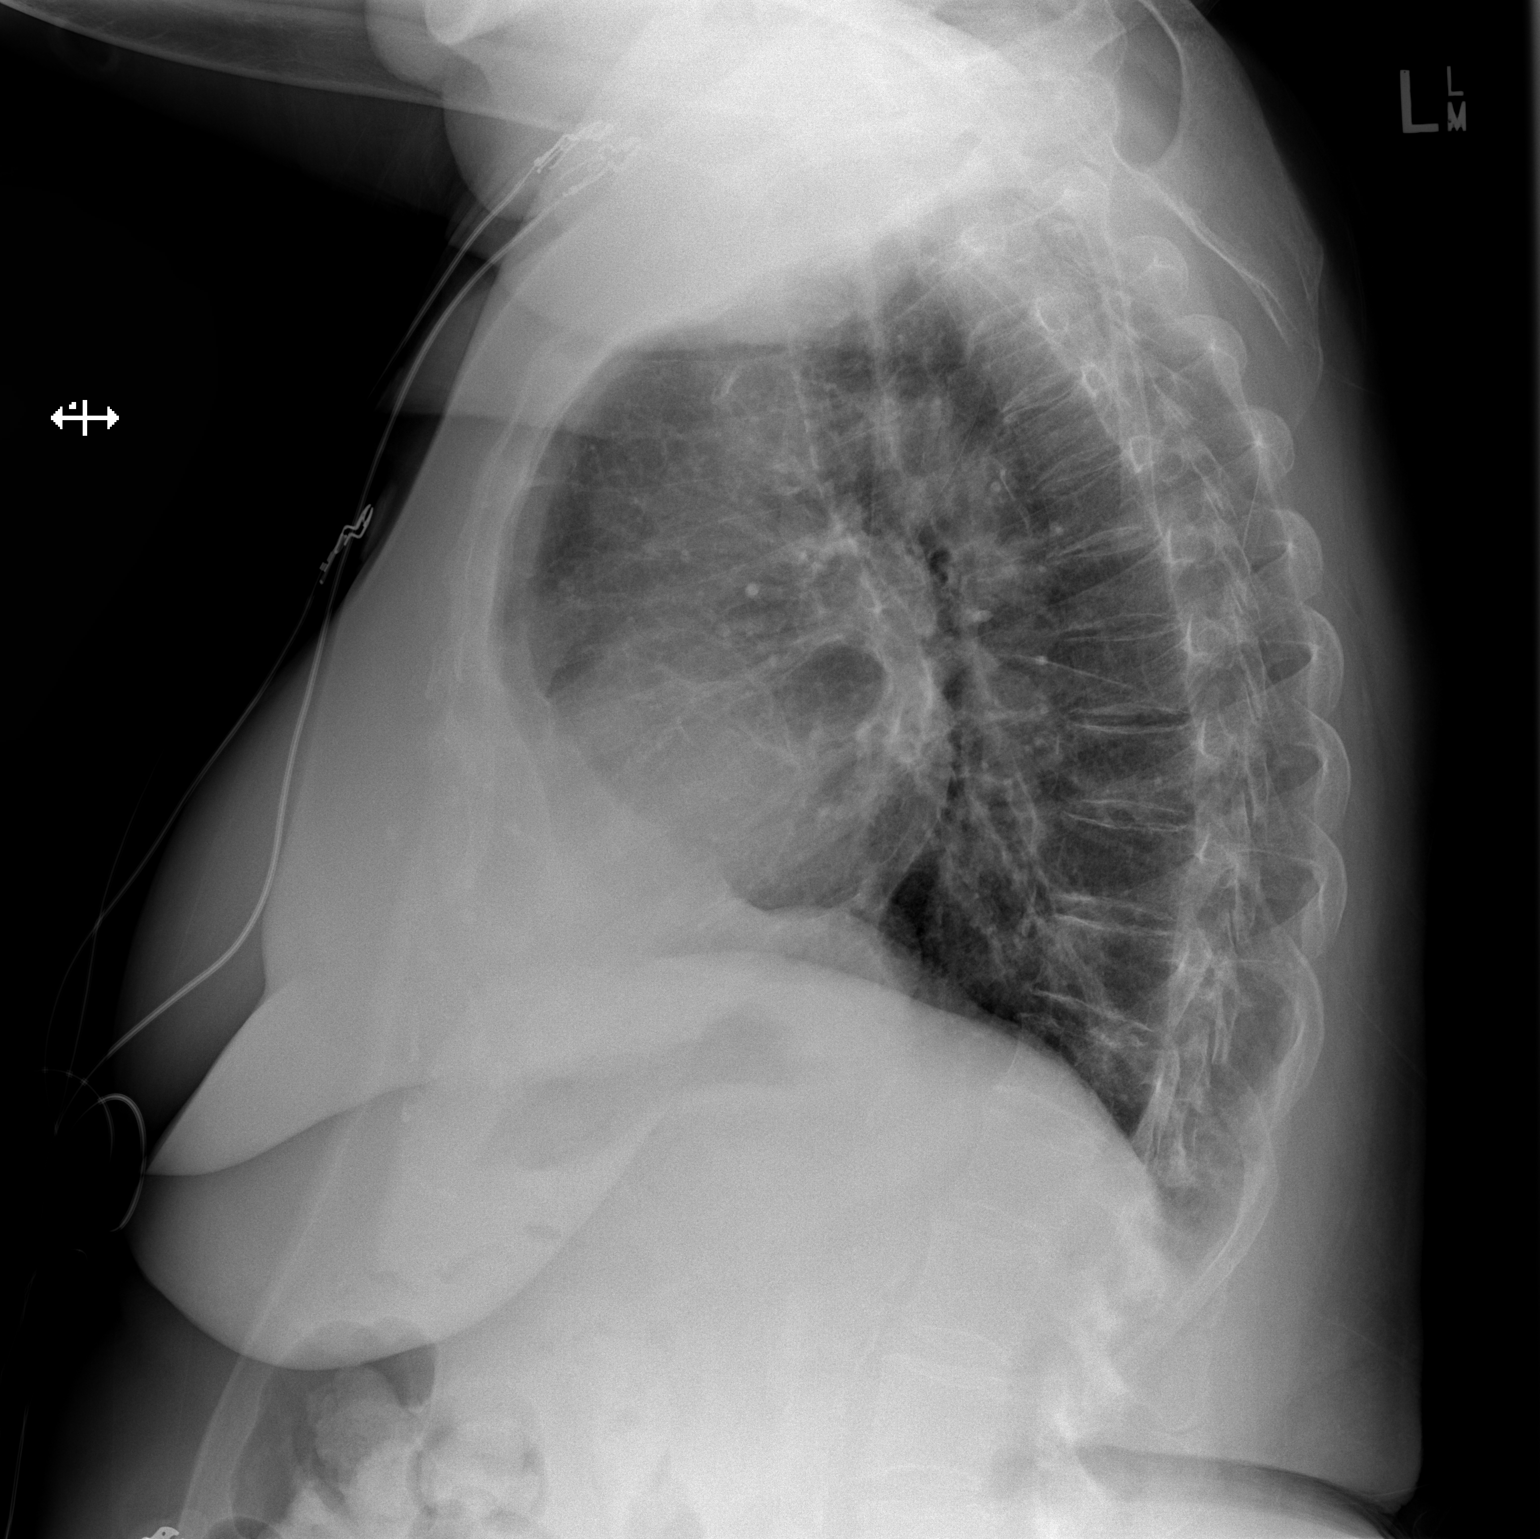

[2 of 2 positions shown; findings below may reference images not displayed]

FINDINGS: Cardiac silhouette is top-normal in size. No mediastinal or hilar
masses or evidence of adenopathy.

Lungs are mildly hyperexpanded but clear. No pleural effusion or
pneumothorax.

Bony thorax is demineralized intact.

No change from the prior study.
IMPRESSION: No active cardiopulmonary disease.

## 2017-05-29 MED ORDER — PANTOPRAZOLE SODIUM 40 MG PO TBEC
40.0000 mg | DELAYED_RELEASE_TABLET | Freq: Two times a day (BID) | ORAL | Status: DC
  Administered 2017-05-29 – 2017-06-06 (×16): 40 mg via ORAL
  Filled 2017-05-29 (×15): qty 1

## 2017-05-29 MED ORDER — SODIUM CHLORIDE 0.9 % IV SOLN
INTRAVENOUS | Status: DC
  Administered 2017-05-29: 23:00:00 via INTRAVENOUS

## 2017-05-29 MED ORDER — ONDANSETRON HCL 4 MG/2ML IJ SOLN: 4.0000 mg | Freq: Four times a day (QID) | INTRAMUSCULAR | Status: DC | PRN

## 2017-05-29 MED ORDER — ONDANSETRON HCL 4 MG PO TABS: 4.0000 mg | ORAL_TABLET | Freq: Four times a day (QID) | ORAL | Status: DC | PRN

## 2017-05-29 MED ORDER — SODIUM CHLORIDE 0.9 % IV SOLN
INTRAVENOUS | Status: DC
  Administered 2017-05-29: 17:00:00 via INTRAVENOUS

## 2017-05-29 MED ORDER — INSULIN ASPART 100 UNIT/ML ~~LOC~~ SOLN
0.0000 [IU] | Freq: Three times a day (TID) | SUBCUTANEOUS | Status: DC
  Administered 2017-05-30 – 2017-06-02 (×8): 3 [IU] via SUBCUTANEOUS
  Administered 2017-06-02 – 2017-06-06 (×8): 2 [IU] via SUBCUTANEOUS

## 2017-05-29 MED ORDER — ENOXAPARIN SODIUM 30 MG/0.3ML ~~LOC~~ SOLN
30.0000 mg | Freq: Every day | SUBCUTANEOUS | Status: DC
  Administered 2017-05-29 – 2017-06-05 (×8): 30 mg via SUBCUTANEOUS
  Filled 2017-05-29 (×9): qty 0.3

## 2017-05-29 MED ORDER — PREDNISONE 20 MG PO TABS
40.0000 mg | ORAL_TABLET | Freq: Every day | ORAL | Status: AC
  Administered 2017-05-30 – 2017-06-02 (×4): 40 mg via ORAL
  Filled 2017-05-29 (×4): qty 2

## 2017-05-29 MED ORDER — METHYLPREDNISOLONE SODIUM SUCC 125 MG IJ SOLR
125.0000 mg | Freq: Once | INTRAMUSCULAR | Status: AC
  Administered 2017-05-29: 125 mg via INTRAVENOUS
  Filled 2017-05-29: qty 2

## 2017-05-29 MED ORDER — METHYLPREDNISOLONE SODIUM SUCC 40 MG IJ SOLR
40.0000 mg | Freq: Two times a day (BID) | INTRAMUSCULAR | Status: AC
  Administered 2017-05-29 – 2017-05-30 (×2): 40 mg via INTRAVENOUS
  Filled 2017-05-29 (×2): qty 1

## 2017-05-29 MED ORDER — IRBESARTAN 150 MG PO TABS
150.0000 mg | ORAL_TABLET | Freq: Every day | ORAL | Status: DC
  Administered 2017-05-30 – 2017-06-06 (×8): 150 mg via ORAL
  Filled 2017-05-29 (×8): qty 1

## 2017-05-29 MED ORDER — CLOPIDOGREL BISULFATE 75 MG PO TABS
75.0000 mg | ORAL_TABLET | Freq: Every evening | ORAL | Status: DC
  Administered 2017-05-29 – 2017-06-05 (×8): 75 mg via ORAL
  Filled 2017-05-29 (×8): qty 1

## 2017-05-29 MED ORDER — ASPIRIN EC 81 MG PO TBEC
81.0000 mg | DELAYED_RELEASE_TABLET | Freq: Every day | ORAL | Status: DC
  Administered 2017-05-29 – 2017-06-05 (×8): 81 mg via ORAL
  Filled 2017-05-29 (×8): qty 1

## 2017-05-29 MED ORDER — SUCRALFATE 1 G PO TABS
1.0000 g | ORAL_TABLET | Freq: Three times a day (TID) | ORAL | Status: DC
  Administered 2017-05-30 – 2017-06-06 (×29): 1 g via ORAL
  Filled 2017-05-29 (×29): qty 1

## 2017-05-29 MED ORDER — LEVALBUTEROL HCL 1.25 MG/0.5ML IN NEBU
1.2500 mg | INHALATION_SOLUTION | RESPIRATORY_TRACT | Status: DC | PRN
  Administered 2017-06-01: 1.25 mg via RESPIRATORY_TRACT
  Filled 2017-05-29: qty 0.5

## 2017-05-29 MED ORDER — METOCLOPRAMIDE HCL 5 MG PO TABS
5.0000 mg | ORAL_TABLET | Freq: Three times a day (TID) | ORAL | Status: DC
  Administered 2017-05-30 – 2017-06-06 (×22): 5 mg via ORAL
  Filled 2017-05-29 (×22): qty 1

## 2017-05-29 MED ORDER — IPRATROPIUM-ALBUTEROL 0.5-2.5 (3) MG/3ML IN SOLN
3.0000 mL | Freq: Four times a day (QID) | RESPIRATORY_TRACT | Status: DC
  Administered 2017-05-29 – 2017-05-30 (×2): 3 mL via RESPIRATORY_TRACT
  Filled 2017-05-29 (×2): qty 3

## 2017-05-29 MED ORDER — INSULIN ASPART 100 UNIT/ML ~~LOC~~ SOLN
0.0000 [IU] | Freq: Every day | SUBCUTANEOUS | Status: DC
  Administered 2017-05-30: 3 [IU] via SUBCUTANEOUS
  Administered 2017-06-02: 2 [IU] via SUBCUTANEOUS

## 2017-05-29 MED ORDER — SENNA 8.6 MG PO TABS
1.0000 | ORAL_TABLET | Freq: Two times a day (BID) | ORAL | Status: DC
  Administered 2017-05-29 – 2017-06-06 (×16): 8.6 mg via ORAL
  Filled 2017-05-29 (×16): qty 1

## 2017-05-29 MED ORDER — ACETAMINOPHEN 325 MG PO TABS
650.0000 mg | ORAL_TABLET | Freq: Four times a day (QID) | ORAL | Status: DC | PRN
  Administered 2017-05-30 – 2017-05-31 (×4): 650 mg via ORAL
  Filled 2017-05-29 (×5): qty 2

## 2017-05-29 MED ORDER — ACETAMINOPHEN-CODEINE #3 300-30 MG PO TABS
1.0000 | ORAL_TABLET | ORAL | Status: DC | PRN
  Administered 2017-05-31 – 2017-06-05 (×6): 1 via ORAL
  Filled 2017-05-29 (×6): qty 1

## 2017-05-29 MED ORDER — ROSUVASTATIN CALCIUM 10 MG PO TABS
20.0000 mg | ORAL_TABLET | Freq: Every day | ORAL | Status: DC
  Administered 2017-05-30 – 2017-06-06 (×8): 20 mg via ORAL
  Filled 2017-05-29 (×8): qty 2

## 2017-05-29 MED ORDER — ACETAMINOPHEN 650 MG RE SUPP: 650.0000 mg | Freq: Four times a day (QID) | RECTAL | Status: DC | PRN

## 2017-05-29 MED ORDER — QUETIAPINE FUMARATE 25 MG PO TABS
25.0000 mg | ORAL_TABLET | Freq: Every day | ORAL | Status: DC
  Administered 2017-05-29 – 2017-06-05 (×8): 25 mg via ORAL
  Filled 2017-05-29 (×8): qty 1

## 2017-05-29 MED ORDER — LORAZEPAM 1 MG PO TABS
1.0000 mg | ORAL_TABLET | Freq: Four times a day (QID) | ORAL | Status: DC | PRN
  Administered 2017-05-29 – 2017-06-02 (×2): 1 mg via ORAL
  Filled 2017-05-29 (×2): qty 1

## 2017-05-29 MED ORDER — POLYETHYLENE GLYCOL 3350 17 G PO PACK: 17.0000 g | PACK | Freq: Every day | ORAL | Status: DC | PRN

## 2017-05-29 NOTE — H&P (Signed)
[name] MVH:[id number] DOB: [birth year] DOA: 05/29/2017     PCP: [name], MD   Outpatient Specialists: Pulmonology [facility] Lone Buccini, Cardiology Claiborne Billings  Patient coming from:    home Lives  With family    Chief Complaint: shortness of breath  HPI: [name] is a 83 y.o. female with medical history significant of Asthma, CAD, DM2, bronchitis, GERD, HTN    Presented with shortness of breath, wheezing and coughing for the past 4 days. She denies any fevers or chills she try to his home number was admitted and seemed to help patient called 911 and was given albuterol with some relief is typical for her prior   asthma exacerbation. She has had wheezing worse with ambulation for the past 4 days. No fever, no chest pain. She is feeling better since being in ER but not resolved Denies any sick contacts Had flu shot 6 days ago reports constipation last BM was 3 day ago' Had to have surgery in the past for SBO     Regarding pertinent Chronic problems: HTN on Micardis 80/25 Troprol 100mg   HLD on Zetia and Crstor  CAD on dual aniplatelate therapy aspirin /Plavix. Last ECHO was March 2017 EF 30-35%  IN ER:  Temp (24hrs), Avg:98.7 F (37.1 C), Min:98.7 F (37.1 C), Max:98.7 F (37.1 C)      on arrival  ED Triage Vitals [05/29/17 1650]  Enc Vitals Group     BP (!) 154/84     Pulse Rate (!) 110     Resp 18     Temp 98.7 F (37.1 C)     Temp Source Oral     SpO2 97 %     Weight      Height      Head Circumference      Peak Flow      Pain Score      Pain Loc      Pain Edu?      Excl. in [place]?     Latest RR 18 97% HR 110 BP 154/84 WBC 5.1 HG 12.1 Plt 149 Na 141 K 4.1 BUN 28 Cr 1.05 AG 8 Troponin <0.03 CXR non acute Following Medications were ordered in ER: Medications  0.9 %  sodium chloride infusion ( Intravenous New Bag/Given 05/29/17 1720)  methylPREDNISolone sodium succinate (SOLU-MEDROL) 125 mg/2 mL injection 125 mg (125 mg Intravenous Given 05/29/17 1718)       Hospitalist was called for admission for asthma Exacerbation  Review of Systems:    Pertinent positives include: shortness of breath at rest.   dyspnea on exertion, wheezing.  Constitutional:  No weight loss, night sweats, Fevers, chills, fatigue, weight loss  HEENT:  No headaches, Difficulty swallowing,Tooth/dental problems,Sore throat,  No sneezing, itching, ear ache, nasal congestion, post nasal drip,  Cardio-vascular:  No chest pain, Orthopnea, PND, anasarca, dizziness, palpitations.no Bilateral lower extremity swelling  GI:  No heartburn, indigestion, abdominal pain, nausea, vomiting, diarrhea, change in bowel habits, loss of appetite, melena, blood in stool, hematemesis Resp:    No excess mucus, no productive cough, No non-productive cough, No coughing up of blood.No change in color of mucus.  Skin:  no rash or lesions. No jaundice GU:  no dysuria, change in color of urine, no urgency or frequency. No straining to urinate.  No flank pain.  Musculoskeletal:  No joint pain or no joint swelling. No decreased range of motion. No back pain.  Psych:  No change in mood or  affect. No depression or anxiety. No memory loss.  Neuro: no localizing neurological complaints, no tingling, no weakness, no double vision, no gait abnormality, no slurred speech, no confusion  As per HPI otherwise 10 point review of systems negative.   Past Medical History: Past Medical History:  Diagnosis Date  . Acute blood loss anemia   . Allergic rhinitis   . Anxiety   . Anxiety   . Asthma   . 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 CAD (coronary artery disease)   . Chronic pain syndrome   . Coronary artery disease   . Diabetes mellitus without complication ([place])   . Essential tremor   . GAD (generalized anxiety disorder)   . GERD (gastroesophageal reflux disease)   . H/O cardiovascular stress test 06-26-2012   compared to previous study  there is no significant change; normal myocardial perfusion study. no significant ischemia demonstrated . this is a low risk scan  . H/O echocardiogram 06-26-2012   EF 55%  . History of cardiac cath 09-30-1998   cath done by Dr. Claiborne Billings, successful rotation atherectomy of the left anterior decending artert with reduction from 80 to105  . History of Doppler ultrasound 03-30-2008   neg abd. doppler for aneurysm  . History of Holter monitoring 12-22-2007   NSR with pac occs. pvc's  . HLD (hyperlipidemia)   . Hx of cardiac cath 09-26-1998   dignostic cath dr. Claiborne Billings to do rotoblator at a later date  . Hypertension   . Hypothyroidism   . Insomnia   . Osteoarthritis   . Osteopenia   . Partial small bowel obstruction ([place])   . Physical deconditioning   . Sepsis due to pneumonia [facility])    Past Surgical History:  Procedure Laterality Date  . APPENDECTOMY  1947  . Bladder Tack  1970   Dr Rosana Hoes  . BREAST LUMPECTOMY  1970   Left, Dr Leandrew Koyanagi  . DILATION AND CURETTAGE OF UTERUS  1960  . LAPAROSCOPY N/A 01/19/2016   Procedure: LAPAROSCOPY DIAGNOSTIC;  Surgeon: Greer Pickerel, MD;  Location: [facility];  Service: General;  Laterality: N/A;  . LYSIS OF ADHESION N/A 01/19/2016   Procedure: LYSIS OF ADHESION;  Surgeon: Greer Pickerel, MD;  Location: [facility];  Service: General;  Laterality: N/A;  . NASAL SINUS SURGERY  1980   Left; for chronic sinusitis  . REVISION TOTAL KNEE ARTHROPLASTY  2005   Dr Alvan Dame Right knee  . VESICOVAGINAL FISTULA CLOSURE W/ TAH  1971   Dr Mallie Mussel     Social History:  Ambulatory   independently    reports that she has never smoked. She has never used smokeless tobacco. She reports that she does not drink alcohol or use drugs.  Allergies:  No Known Allergies     Family History:   Family History  Problem Relation Age of Onset  . Diabetes Mother   . Heart disease Mother   . Diabetes Father   . Parkinson's disease Father   . Heart disease Father   . Asthma Father   . Heart disease  Brother   . Diabetes Brother   . Diabetes Brother   . Diabetes Brother     Medications: Prior to Admission medications   Medication Sig Start Date End Date Taking? Authorizing Provider  ANORO ELLIPTA 62.5-25 MCG/INH AEPB INHALE 1 PUFF BY MOUTH EVERY DAY 05/08/17  Yes Baird Lyons D, MD  aspirin EC 81 MG tablet Take 81 mg  by mouth at bedtime.    Yes [provider]  azelastine (ASTELIN) 137 MCG/SPRAY nasal spray Place 1 spray into the nose 2 (two) times daily. Use in each nostril as directed   Yes [provider]  clopidogrel (PLAVIX) 75 MG tablet Take 75 mg by mouth every evening.    Yes [provider]  clorazepate (TRANXENE) 15 MG tablet Take 15 mg by mouth at bedtime. anxiety   Yes [provider]  furosemide (LASIX) 20 MG tablet Take 1 tablet alternating with 2 tablets every other day 08/27/16  Yes Troy Sine, MD  levofloxacin (LEVAQUIN) 500 MG tablet TAKE 1 TABLET BY MOUTH EVERY DAY FOR 7 DAYS 05/26/17  Yes [provider]  Loratadine-Pseudoephedrine (CLARITIN-D 24 HOUR PO) Take 1 tablet by mouth daily.   Yes [provider]  LORazepam (ATIVAN) 1 MG tablet Take 1 mg by mouth every 8 (eight) hours.   Yes [provider]  metoprolol succinate (TOPROL-XL) 50 MG 24 hr tablet TAKE 2 TABLETS IN THE MORNING AND 1 TABLET IN THE EVENING 04/15/17  Yes Troy Sine, MD  Multiple Vitamin (MULTIVITAMIN WITH MINERALS) TABS tablet Take 1 tablet by mouth daily.   Yes [provider]  pantoprazole (PROTONIX) 40 MG tablet Take 40 mg by mouth 2 (two) times daily.    Yes [provider]  QUEtiapine (SEROQUEL) 25 MG tablet Take 25 mg by mouth at bedtime.  04/29/17  Yes [provider]  QVAR 40 MCG/ACT inhaler TAKE 2 PUFFS BY MOUTH TWICE A DAY 04/04/17  Yes Young, Clinton D, MD  rosuvastatin (CRESTOR) 20 MG tablet Take 20 mg by mouth daily. 03/26/17  Yes [provider]  sucralfate (CARAFATE) 1 g tablet TAKE 1  TABLET EVERY MORNING & UP TO 4 TIMES A DAY ON AN EMPTY STOMACH AS NEEDED FOR NAUSEA 05/09/17  Yes [provider]  telmisartan (MICARDIS) 40 MG tablet Take 40 mg by mouth daily.   Yes [provider]  Vitamin D, Ergocalciferol, 2000 units CAPS Take 1 capsule by mouth daily.   Yes [provider]  acetaminophen-codeine (TYLENOL #3) 300-30 MG tablet Take 1 tablet by mouth every 4 (four) hours as needed for moderate pain.    [provider]  albuterol (PROVENTIL HFA;VENTOLIN HFA) 108 (90 Base) MCG/ACT inhaler Inhale 2 puffs into the lungs every 6 (six) hours as needed for wheezing or shortness of breath.    [provider]  albuterol (PROVENTIL) (2.5 MG/3ML) 0.083% nebulizer solution Take 3 mLs (2.5 mg total) by nebulization every 6 (six) hours as needed for wheezing. 05/02/15   Deneise Lever, MD  Beclomethasone Diprop HFA (QVAR REDIHALER) 40 MCG/ACT AERB Inhale 2 puffs into the lungs 2 (two) times daily. Patient not taking: Reported on 05/29/2017 10/26/16   Parrett, Fonnie Mu, NP  loperamide (IMODIUM A-D) 2 MG tablet Take 2 mg by mouth as needed for diarrhea or loose stools.     [provider]  metoCLOPramide (REGLAN) 5 MG tablet TAKE 1 TABLET 3 TIMES A DAY BEFORE MEALS AS NEEDED FOR NAUSEA 05/10/17   [provider]  nitroGLYCERIN (NITROSTAT) 0.4 MG SL tablet Place 0.4 mg under the tongue every 5 (five) minutes as needed. For chest pain    [provider]  TRULICITY 4.03 KV/4.2VZ SOPN INJECT 1 PEN SUBCUTANEOUSLY WEEKLY 03/27/17   [provider]    Physical Exam: Patient Vitals for the past 24 hrs:  BP Temp Temp src Pulse Resp  SpO2  05/29/17 1650 (!) 154/84 98.7 F (37.1 C) Oral (!) 110 18 97 %    1. General:  in No Acute distress  well  -appearing 2. Psychological: Alert and  Oriented 3. Head/ENT:     Dry Mucous Membranes                          Head Non traumatic, neck supple                           Poor  Dentition 4. SKIN: decreased Skin turgor,  Skin clean Dry and intact no rash 5. Heart: Regular rate and rhythm no  Murmur, no Rub or gallop 6. Lungs occasinal  wheezes or crackles   7. Abdomen: Soft,  non-tender,  distended  obese  bowel sounds present 8. Lower extremities: no clubbing, cyanosis, or edema 9. Neurologically Grossly intact, moving all 4 extremities equally   10. MSK: Normal range of motion   body mass index is unknown because there is no height or weight on file.  Labs on Admission:   Labs on Admission: I have personally reviewed following labs and imaging studies  CBC:  Recent Labs Lab 05/29/17 1631  WBC 5.1  NEUTROABS 2.8  HGB 12.1  HCT 38.5  MCV 86.1  PLT 409*   Basic Metabolic Panel:  Recent Labs Lab 05/29/17 1631  NA 141  K 4.1  CL 106  CO2 27  GLUCOSE 136*  BUN 28*  CREATININE 1.05*  CALCIUM 9.4   GFR: CrCl cannot be calculated (Unknown ideal weight.). Liver Function Tests: No results for input(s): AST, ALT, ALKPHOS, BILITOT, PROT, ALBUMIN in the last 168 hours. No results for input(s): LIPASE, AMYLASE in the last 168 hours. No results for input(s): AMMONIA in the last 168 hours. Coagulation Profile: No results for input(s): INR, PROTIME in the last 168 hours. Cardiac Enzymes:  Recent Labs Lab 05/29/17 1631  TROPONINI <0.03   BNP (last 3 results) No results for input(s): PROBNP in the last 8760 hours. HbA1C: No results for input(s): HGBA1C in the last 72 hours. CBG: No results for input(s): GLUCAP in the last 168 hours. Lipid Profile: No results for input(s): CHOL, HDL, LDLCALC, TRIG, CHOLHDL, LDLDIRECT in the last 72 hours. Thyroid Function Tests: No results for input(s): TSH, T4TOTAL, FREET4, T3FREE, THYROIDAB in the last 72 hours. Anemia Panel: No results for input(s): VITAMINB12, FOLATE, FERRITIN, TIBC, IRON, RETICCTPCT in the last 72 hours. Urine analysis:  Sepsis Labs: @LABRCNTIP (procalcitonin:4,lacticidven:4) )No  results found for this or any previous visit (from the past 240 hour(s)).     UA not ordered  Lab Results  Component Value Date   HGBA1C 7.4 (H) 01/11/2016    CrCl cannot be calculated (Unknown ideal weight.).  BNP (last 3 results) No results for input(s): PROBNP in the last 8760 hours.   ECG REPORT  Independently reviewed Rate:97  Rhythm: LBBB old  ST&T Change:NA QTC 497  There were no vitals filed for this visit.   Cultures:    Component Value Date/Time   SDES URINE, CATHETERIZED 01/11/2016 0957   SPECREQUEST NONE 01/11/2016 0957   CULT NO GROWTH 01/11/2016 0957   REPTSTATUS 01/12/2016 FINAL 01/11/2016 0957     Radiological Exams on Admission: Dg Chest 2 View  Result Date: 05/29/2017 CLINICAL DATA:  83 year old female with a history of cough and wheeze EXAM: CHEST  2 VIEW COMPARISON:  01/24/2016 FINDINGS: Cardiomediastinal  silhouette unchanged in size and contour. No evidence of central vascular congestion. No pneumothorax. No confluent airspace disease. No pleural effusion. Coarsened interstitial markings, similar to the comparison studies. No displaced fracture. IMPRESSION: Chronic lung changes without evidence of acute cardiopulmonary disease. Electronically Signed   By: Corrie Mckusick D.O.   On: 05/29/2017 18:13    Chart has been reviewed    Assessment/Plan  83 y.o. female with medical history significant of Asthma, CAD, DM2, bronchitis, GERD, HTN  Admitted for asthma exacerbation  Present on Admission: . Asthma exacerbation -    Will initiate: Steroid taper  - Albuterol   XopenexPRN, - scheduled duoneb, restart q var at discharge    -  Mucinex.  Titrate O2 to saturation >90%. Follow patients respiratory status.  Obtain viral panel . Acute on chronic respiratory failure (HCC)Likely secondary to asthma exacerbation but given advanced age will monitor on telemetry cycle cardiac enzymes Constipation - obtain KUB to eval for stool burden, Evidence of  moderate degree of stool burden will order bowel management if no result may need enema . Coronary atherosclerosis continue aspirin and plavix hold BB due to bronchospasm . HLD (hyperlipidemia) stable continue home medications . GERD (gastroesophageal reflux disease) - contiue protonix . HTN (hypertension) - continue home medications . Dyspnea - cycle CE given hx of CAD but most likely due to asthma Dm 2-  - Order   moderate SSI     -  check TSH and HgA1C  - Hold by mouth medications     Other plan as per orders.  DVT prophylaxis:   Lovenox     Code Status:  limited code, no intubaton as per patient   Family Communication:   Family not  at  Bedside   Disposition Plan:      To home once workup is complete and patient is stable                         Would benefit from PT/OT eval prior to DC   ordered                                                Consults called: None   Admission status:   inpatient       Level of care    tele         I have spent a total of 42min on this admission   Yovan Leeman 05/29/2017, 10:06 PM    [facility]  Pager [phone]   after 2 AM please page floor coverage PA If 7AM-7PM, please contact the day team taking care of the patient  [email or web address]  Password [email or web address]

## 2017-05-29 NOTE — ED Provider Notes (Signed)
[facility] DEPT Provider Note   CSN: [id number] Arrival date & time: 05/29/17  1608     History   Chief Complaint Chief Complaint  Patient presents with  . Shortness of Breath  . Wheezing    HPI [name] is a 83 y.o. female.  83 y/o female w/ h/o asthma presents with 4 days of dyspnea and wheezing No fever, emesis, diarrhea No orthopnea but some dyspnea on exertion Using home nebulizer with temp relief No le edema Sx better also with rest Called ems and given albuterol with good relief      Past Medical History:  Diagnosis Date  . Acute blood loss anemia   . Allergic rhinitis   . Anxiety   . Anxiety   . Asthma   . 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 CAD (coronary artery disease)   . Chronic pain syndrome   . Coronary artery disease   . Diabetes mellitus without complication ([place])   . Essential tremor   . GAD (generalized anxiety disorder)   . GERD (gastroesophageal reflux disease)   . H/O cardiovascular stress test 06-26-2012   compared to previous study there is no significant change; normal myocardial perfusion study. no significant ischemia demonstrated . this is a low risk scan  . H/O echocardiogram 06-26-2012   EF 55%  . History of cardiac cath 09-30-1998   cath done by Dr. Claiborne Billings, successful rotation atherectomy of the left anterior decending artert with reduction from 80 to105  . History of Doppler ultrasound 03-30-2008   neg abd. doppler for aneurysm  . History of Holter monitoring 12-22-2007   NSR with pac occs. pvc's  . HLD (hyperlipidemia)   . Hx of cardiac cath 09-26-1998   dignostic cath dr. Claiborne Billings to do rotoblator at a later date  . Hypertension   . Hypothyroidism   . Insomnia   . Osteoarthritis   . Osteopenia   . Partial small bowel obstruction ([place])   . Physical deconditioning   . Sepsis due to pneumonia [facility])     Patient Active Problem List   Diagnosis Date Noted  . Bowel  obstruction ([place])   . Preop cardiovascular exam 01/18/2016  . SBO (small bowel obstruction) ([place]) 01/12/2016  . Hypokalemia 01/12/2016  . Sepsis ([place]) 01/11/2016  . Acute on chronic respiratory failure ([place]) 01/11/2016  . AKI (acute kidney injury) ([place])   . Diabetes mellitus with complication ([place])   . UTI (lower urinary tract infection)   . Cough 12/29/2015  . Acid reflux 12/29/2015  . PND (post-nasal drip) 12/29/2015  . Diabetes mellitus type 2 without retinopathy ([place]) 07/15/2015  . Sepsis due to pneumonia ([place]) 07/15/2015  . GERD (gastroesophageal reflux disease) 05/08/2015  . Left bundle branch block 07/31/2014  . Asthma exacerbation 01/18/2014  . Abdominal pain, other specified site 01/19/2013  . Nonspecific abnormal electrocardiogram (ECG) - intermittent BBB 12/01/2012  . HTN (hypertension) 11/30/2012  . HLD (hyperlipidemia) 11/30/2012  . Chest pain 11/30/2012  . PAF - with RVR, converted sponateously 11/30/2012  . Hypothyroid 11/30/2012  . Arthritis, degenerative 02/27/2010  . Type 2 diabetes mellitus ([place]) 02/27/2010  . Adult hypothyroidism 02/27/2010  . Essential (primary) hypertension 02/27/2010  . Gastric catarrh 02/27/2010  . Allergic rhinitis 05/24/2009  . Candidiasis of skin and nail 05/24/2009  . Bone/cartilage disorder 03/15/2009  . Pruritic dermatitis 12/02/2008  . Clinical depression 11/03/2008  . Anxiety state 11/04/2007  . Atherosclerosis of coronary artery 10/16/2007  .  Diabetes mellitus type 2, controlled ([place]) 07/08/2007  . Anxiety 07/08/2007  . Coronary atherosclerosis 07/08/2007  . Asthmatic bronchitis, mild intermittent, uncomplicated 41/96/2229  . Seasonal and perennial allergic rhinitis 07/08/2007  . ARTHRITIS 07/08/2007    Past Surgical History:  Procedure Laterality Date  . APPENDECTOMY  1947  . Bladder Tack  1970   Dr Rosana Hoes  . BREAST LUMPECTOMY  1970   Left, Dr Leandrew Koyanagi  . DILATION AND CURETTAGE OF UTERUS  1960  . LAPAROSCOPY N/A  01/19/2016   Procedure: LAPAROSCOPY DIAGNOSTIC;  Surgeon: Greer Pickerel, MD;  Location: [facility];  Service: General;  Laterality: N/A;  . LYSIS OF ADHESION N/A 01/19/2016   Procedure: LYSIS OF ADHESION;  Surgeon: Greer Pickerel, MD;  Location: [facility];  Service: General;  Laterality: N/A;  . NASAL SINUS SURGERY  1980   Left; for chronic sinusitis  . REVISION TOTAL KNEE ARTHROPLASTY  2005   Dr Alvan Dame Right knee  . VESICOVAGINAL FISTULA CLOSURE W/ TAH  1971   Dr Mallie Mussel    OB History    No data available       Home Medications    Prior to Admission medications   Medication Sig Start Date End Date Taking? Authorizing Provider  acetaminophen-codeine (TYLENOL #3) 300-30 MG tablet Take 1 tablet by mouth every 4 (four) hours as needed for moderate pain.    [provider]  albuterol (PROVENTIL HFA;VENTOLIN HFA) 108 (90 Base) MCG/ACT inhaler Inhale 2 puffs into the lungs every 6 (six) hours as needed for wheezing or shortness of breath.    [provider]  albuterol (PROVENTIL) (2.5 MG/3ML) 0.083% nebulizer solution Take 3 mLs (2.5 mg total) by nebulization every 6 (six) hours as needed for wheezing. 05/02/15   Deneise Lever, MD  ANORO ELLIPTA 62.5-25 MCG/INH AEPB INHALE 1 PUFF BY MOUTH EVERY DAY 05/08/17   Baird Lyons D, MD  aspirin EC 81 MG tablet Take 81 mg by mouth at bedtime.     [provider]  azelastine (ASTELIN) 137 MCG/SPRAY nasal spray Place 1 spray into the nose 2 (two) times daily. Use in each nostril as directed    [provider]  Beclomethasone Diprop HFA (QVAR REDIHALER) 40 MCG/ACT AERB Inhale 2 puffs into the lungs 2 (two) times daily. 10/26/16   Parrett, Fonnie Mu, NP  clopidogrel (PLAVIX) 75 MG tablet Take 75 mg by mouth every evening.     [provider]  clorazepate (TRANXENE) 15 MG tablet Take 15 mg by mouth at bedtime. anxiety    [provider]  docusate sodium (COLACE) 100 MG capsule Take 100 mg by mouth daily.    [provider]  furosemide (LASIX) 20 MG tablet Take 1 tablet alternating with 2 tablets every other day 08/27/16   Troy Sine, MD  loperamide (IMODIUM A-D) 2 MG tablet Take 2 mg by mouth as needed for diarrhea or loose stools.     [provider]  LORazepam (ATIVAN) 1 MG tablet Take 1 mg by mouth every 8 (eight) hours as needed for anxiety.    [provider]  metoprolol succinate (TOPROL-XL) 50 MG 24 hr tablet TAKE 2 TABLETS IN THE MORNING AND 1 TABLET IN THE EVENING 04/15/17   Troy Sine, MD  Multiple Vitamin (MULTIVITAMIN WITH MINERALS) TABS tablet Take 1 tablet by mouth daily.    [provider]  nitroGLYCERIN (NITROSTAT) 0.4 MG SL tablet Place 0.4 mg under the tongue every 5 (five) minutes as needed.  For chest pain    [provider]  omeprazole (PRILOSEC) 20 MG capsule Take 20 mg by mouth 2 (two) times daily before a meal.     [provider]  pantoprazole (PROTONIX) 40 MG tablet Take 40 mg by mouth daily.    [provider]  polyethylene glycol (MIRALAX / GLYCOLAX) packet Take 17 g by mouth 2 (two) times daily.    [provider]  QVAR 40 MCG/ACT inhaler TAKE 2 PUFFS BY MOUTH TWICE A DAY 04/04/17   Young, Clinton D, MD  telmisartan (MICARDIS) 40 MG tablet Take 40 mg by mouth daily.    [provider]  topiramate (TOPAMAX) 25 MG tablet Take 25 mg by mouth at bedtime.    [provider]  ZETIA 10 MG tablet Take 10 mg by mouth daily. 08/15/15   [provider]    Family History Family History  Problem Relation Age of Onset  . Diabetes Mother   . Heart disease Mother   . Diabetes Father   . Parkinson's disease Father   . Heart disease Father   . Asthma Father   . Heart disease Brother   . Diabetes Brother   . Diabetes Brother   . Diabetes Brother     Social History Social History  Substance Use Topics  . Smoking status: Never Smoker  . Smokeless tobacco: Never Used  . Alcohol use No      Allergies   Patient has no known allergies.   Review of Systems Review of Systems  All other systems reviewed and are negative.    Physical Exam Updated Vital Signs There were no vitals taken for this visit.  Physical Exam  Constitutional: She is oriented to person, place, and time. She appears well-developed and well-nourished.  Non-toxic appearance. No distress.  HENT:  Head: Normocephalic and atraumatic.  Eyes: Pupils are equal, round, and reactive to light. Conjunctivae, EOM and lids are normal.  Neck: Normal range of motion. Neck supple. No tracheal deviation present. No thyroid mass present.  Cardiovascular: Normal rate, regular rhythm and normal heart sounds.  Exam reveals no gallop.   No murmur heard. Pulmonary/Chest: Effort normal. No stridor. No respiratory distress. She has decreased breath sounds in the right lower field and the left lower field. She has wheezes in the right lower field and the left lower field. She has no rhonchi. She has no rales.  Abdominal: Soft. Normal appearance and bowel sounds are normal. She exhibits no distension. There is no tenderness. There is no rebound and no CVA tenderness.  Musculoskeletal: Normal range of motion. She exhibits no edema or tenderness.  Neurological: She is alert and oriented to person, place, and time. She has normal strength. No cranial nerve deficit or sensory deficit. GCS eye subscore is 4. GCS verbal subscore is 5. GCS motor subscore is 6.  Skin: Skin is warm and dry. No abrasion and no rash noted.  Psychiatric: She has a normal mood and affect. Her speech is normal and behavior is normal.  Nursing note and vitals reviewed.    ED Treatments / Results  Labs (all labs ordered are listed, but only abnormal results are displayed) Labs Reviewed  CBC WITH DIFFERENTIAL/PLATELET  BASIC METABOLIC PANEL  TROPONIN I    EKG  EKG Interpretation  Date/Time:  Wednesday May 29 2017 17:10:56 EDT Ventricular  Rate:  97 PR Interval:    QRS Duration: 156 QT Interval:  391 QTC Calculation: 497 R Axis:  Text Interpretation:  Sinus arrhythmia Left bundle branch block No significant change since last tracing Confirmed by Lacretia Leigh ([id number]) on 05/29/2017 6:55:39 PM       Radiology No results found.  Procedures Procedures (including critical care time)  Medications Ordered in ED Medications  0.9 %  sodium chloride infusion (not administered)     Initial Impression / Assessment and Plan / ED Course  I have reviewed the triage vital signs and the nursing notes.  Pertinent labs & imaging results that were available during my care of the patient were reviewed by me and considered in my medical decision making (see chart for details).   patient treated with steroids as well as beta agonist therapy here. Will be limited to the hospital  Final Clinical Impressions(s) / ED Diagnoses   Final diagnoses:  None    New Prescriptions New Prescriptions   No medications on file     Lacretia Leigh, MD 05/29/17 [phone]

## 2017-05-29 NOTE — ED Triage Notes (Signed)
Per EMS: Pt is coming from home Pt has been coughing and wheezing for 3 days. Pt has taken her home inhaler and nebulizer treatment with no aid. EMS reports possible crackles in her lungs. Pt reports she had pneumonia a few times last year.  Cough is non-productive.   Pt had 2 duonebs with EMS  Last vitals 147/119 Pulse: 110 RR 22 SPO2 97% on Nebulizer Pt was 96% on RA

## 2017-05-30 ENCOUNTER — Ambulatory Visit: Payer: Self-pay | Admitting: Acute Care

## 2017-05-30 DIAGNOSIS — R0600 Dyspnea, unspecified: Secondary | ICD-10-CM

## 2017-05-30 LAB — PHOSPHORUS: PHOSPHORUS: 3.5 mg/dL (ref 2.5–4.6)

## 2017-05-30 LAB — TROPONIN I: Troponin I: <0.03 ng/mL (ref <0.03)

## 2017-05-30 LAB — RESPIRATORY PANEL BY PCR
ADENOVIRUS-RVPPCR: NOT DETECTED
Bordetella pertussis: NOT DETECTED
CORONAVIRUS NL63-RVPPCR: NOT DETECTED
CORONAVIRUS OC43-RVPPCR: NOT DETECTED
Chlamydophila pneumoniae: NOT DETECTED
Coronavirus 229E: NOT DETECTED
Coronavirus HKU1: NOT DETECTED
INFLUENZA A-RVPPCR: NOT DETECTED
INFLUENZA B-RVPPCR: NOT DETECTED
METAPNEUMOVIRUS-RVPPCR: NOT DETECTED
MYCOPLASMA PNEUMONIAE-RVPPCR: NOT DETECTED
PARAINFLUENZA VIRUS 1-RVPPCR: NOT DETECTED
PARAINFLUENZA VIRUS 2-RVPPCR: NOT DETECTED
PARAINFLUENZA VIRUS 3-RVPPCR: NOT DETECTED
PARAINFLUENZA VIRUS 4-RVPPCR: NOT DETECTED
RESPIRATORY SYNCYTIAL VIRUS-RVPPCR: NOT DETECTED
RHINOVIRUS / ENTEROVIRUS - RVPPCR: NOT DETECTED

## 2017-05-30 LAB — HEMOGLOBIN A1C
Hgb A1c MFr Bld: 6.6 % — ABNORMAL HIGH (ref 4.8–5.6)
MEAN PLASMA GLUCOSE: 142.72 mg/dL

## 2017-05-30 LAB — COMPREHENSIVE METABOLIC PANEL
ALBUMIN: 3.4 g/dL — AB (ref 3.5–5.0)
ALK PHOS: 53 U/L (ref 38–126)
ALT: 20 U/L (ref 14–54)
ANION GAP: 9 (ref 5–15)
AST: 22 U/L (ref 15–41)
BILIRUBIN TOTAL: 0.2 mg/dL — AB (ref 0.3–1.2)
BUN: 24 mg/dL — ABNORMAL HIGH (ref 6–20)
CO2: 25 mmol/L (ref 22–32)
Calcium: 9.1 mg/dL (ref 8.9–10.3)
Chloride: 107 mmol/L (ref 101–111)
Creatinine, Ser: 0.96 mg/dL (ref 0.44–1.00)
GFR calc non Af Amer: 51 mL/min — ABNORMAL LOW (ref >60)
GFR, EST AFRICAN AMERICAN: 59 mL/min — AB (ref >60)
GLUCOSE: 182 mg/dL — AB (ref 65–99)
POTASSIUM: 4.2 mmol/L (ref 3.5–5.1)
Sodium: 141 mmol/L (ref 135–145)
TOTAL PROTEIN: 6.3 g/dL — AB (ref 6.5–8.1)

## 2017-05-30 LAB — GLUCOSE, CAPILLARY
Glucose-Capillary: 156 mg/dL — ABNORMAL HIGH (ref 65–99)
Glucose-Capillary: 180 mg/dL — ABNORMAL HIGH (ref 65–99)
Glucose-Capillary: 191 mg/dL — ABNORMAL HIGH (ref 65–99)
Glucose-Capillary: 254 mg/dL — ABNORMAL HIGH (ref 65–99)

## 2017-05-30 LAB — CBC
HEMATOCRIT: 37.4 % (ref 36.0–46.0)
HEMOGLOBIN: 11.7 g/dL — AB (ref 12.0–15.0)
MCH: 26.9 pg (ref 26.0–34.0)
MCHC: 31.3 g/dL (ref 30.0–36.0)
MCV: 86 fL (ref 78.0–100.0)
Platelets: 170 10*3/uL (ref 150–400)
RBC: 4.35 MIL/uL (ref 3.87–5.11)
RDW: 14.4 % (ref 11.5–15.5)
WBC: 5.6 10*3/uL (ref 4.0–10.5)

## 2017-05-30 LAB — TSH: TSH: 0.208 u[IU]/mL — ABNORMAL LOW (ref 0.350–4.500)

## 2017-05-30 LAB — MAGNESIUM: MAGNESIUM: 2.1 mg/dL (ref 1.7–2.4)

## 2017-05-30 MED ORDER — IPRATROPIUM-ALBUTEROL 0.5-2.5 (3) MG/3ML IN SOLN
3.0000 mL | Freq: Three times a day (TID) | RESPIRATORY_TRACT | Status: DC
  Administered 2017-05-30 – 2017-06-03 (×14): 3 mL via RESPIRATORY_TRACT
  Filled 2017-05-30 (×14): qty 3

## 2017-05-30 MED ORDER — SODIUM CHLORIDE 0.9% FLUSH: 3.0000 mL | INTRAVENOUS | Status: DC | PRN

## 2017-05-30 MED ORDER — METOPROLOL SUCCINATE ER 50 MG PO TB24
100.0000 mg | ORAL_TABLET | Freq: Every day | ORAL | Status: DC
  Administered 2017-05-30 – 2017-06-06 (×8): 100 mg via ORAL
  Filled 2017-05-30 (×8): qty 2

## 2017-05-30 MED ORDER — SODIUM CHLORIDE 0.9% FLUSH
3.0000 mL | Freq: Two times a day (BID) | INTRAVENOUS | Status: DC
  Administered 2017-05-30 – 2017-06-05 (×13): 3 mL via INTRAVENOUS

## 2017-05-30 MED ORDER — SODIUM CHLORIDE 0.9 % IV SOLN: 250.0000 mL | INTRAVENOUS | Status: DC | PRN

## 2017-05-30 MED ORDER — BISACODYL 10 MG RE SUPP
10.0000 mg | Freq: Once | RECTAL | Status: AC
  Administered 2017-05-30: 10 mg via RECTAL
  Filled 2017-05-30: qty 1

## 2017-05-30 MED ORDER — FUROSEMIDE 20 MG PO TABS
20.0000 mg | ORAL_TABLET | Freq: Every day | ORAL | Status: DC
  Administered 2017-05-30 – 2017-06-06 (×8): 20 mg via ORAL
  Filled 2017-05-30 (×8): qty 1

## 2017-05-30 MED ORDER — METOPROLOL SUCCINATE ER 50 MG PO TB24
50.0000 mg | ORAL_TABLET | Freq: Every day | ORAL | Status: DC
  Administered 2017-05-31 – 2017-06-05 (×6): 50 mg via ORAL
  Filled 2017-05-30 (×6): qty 1

## 2017-05-30 NOTE — Evaluation (Signed)
Physical Therapy Evaluation Patient Details Name: [name] MRN: [id number] DOB: 01/25/1925 Today's Date: 05/30/2017   History of Present Illness  83 y.o. female with medical history significant of Asthma, R TKA, exploratory laparotomy 2* SBO 01/2016, CAD, DM2, bronchitis, GERD, HTN. Dx asthma exacerbation  Clinical Impression  Pt with asthma exacerbation and notable weakness (even pt reports that she is weak and has been for a little bit). She get SOB with very little movement and fatigued easily wit decreased strength and activity tolerance. To benefit from PT acute care and with HHPT upon DC. Pt would prefer to go home and recommend her family stay with her 24 hr initially for a few days until she can get more independent and stronger on her feet.     Follow Up Recommendations Home health PT    Equipment Recommendations  None recommended by PT    Recommendations for Other Services       Precautions / Restrictions Precautions Precautions: Fall Restrictions Weight Bearing Restrictions: No      Mobility  Bed Mobility Overal bed mobility: Needs Assistance Bed Mobility: Supine to Sit     Supine to sit: Min assist     General bed mobility comments: increased time and use of bedrail  Transfers Overall transfer level: Needs assistance Equipment used: Rolling walker (2 wheeled) Transfers: Sit to/from [facility] Sit to Stand: Mod assist;+2 safety/equipment Stand pivot transfers: Min assist;+2 safety/equipment       General transfer comment: little assist to rise from lower surface   Ambulation/Gait Ambulation/Gait assistance: Min guard Ambulation Distance (Feet): 40 Feet Assistive device: Rolling walker (2 wheeled) Gait Pattern/deviations: Step-through pattern     General Gait Details: small steps, limited distance however patient had been on [facility] with nursing prior to our sessiona nd had stood at bedside for about 3 minutes prior to ambulation. So  she fatigued very quickly   Financial trader [place] (Stroke Patients Only)       Balance                                             Pertinent Vitals/Pain Pain Assessment: No/denies pain    Home Living Family/patient expects to be discharged to:: Private residence Living Arrangements: Children Available Help at Discharge: Family Type of Home: House Home Access: Stairs to enter   Technical brewer of Steps: 1 Home Layout: One level Home Equipment: Environmental consultant - 4 wheels      Prior Function Level of Independence: Independent with assistive device(s)         Comments: son assists with meals, groceries and laundry, but patient states she is there alone at times but family can help and live close (her niece lives with her?) and uses the 4 wheeled RW to move around the house      Hand Dominance        Extremity/Trunk Assessment   Upper Extremity Assessment Upper Extremity Assessment: Generalized weakness    Lower Extremity Assessment Lower Extremity Assessment: Generalized weakness       Communication   Communication: No difficulties  Cognition Arousal/Alertness: Awake/alert Behavior During Therapy: WFL for tasks assessed/performed Overall Cognitive Status: Within Functional Limits for tasks assessed  General Comments      Exercises     Assessment/Plan    PT Assessment Patient needs continued PT services  PT Problem List Decreased strength;Decreased activity tolerance;Decreased mobility       PT Treatment Interventions Gait training;Functional mobility training;Therapeutic exercise;Therapeutic activities;Patient/family education    PT Goals (Current goals can be found in the Care Plan section)  Acute Rehab PT Goals Patient Stated Goal: pt wants to go home and not to a nursing home. Would be open to HHPT to help her get a little  stronger. "I am weak and have been for a while".  PT Goal Formulation: With patient Time For Goal Achievement: 05/30/17 Potential to Achieve Goals: Good    Frequency Min 3X/week   Barriers to discharge        Co-evaluation               AM-PAC PT "6 Clicks" Daily Activity  Outcome Measure Difficulty turning over in bed (including adjusting bedclothes, sheets and blankets)?: Unable Difficulty moving from lying on back to sitting on the side of the bed? : Unable Difficulty sitting down on and standing up from a chair with arms (e.g., wheelchair, bedside commode, etc,.)?: Unable Help needed moving to and from a bed to chair (including a wheelchair)?: A Little Help needed walking in hospital room?: A Little Help needed climbing 3-5 steps with a railing? : A Lot 6 Click Score: 11    End of Session   Activity Tolerance: Patient tolerated treatment well;Patient limited by fatigue (limited by SOB as well ) Patient left: in chair;with call bell/phone within reach;with chair alarm set (power was out so I alerted patient to call out for help to NOT get up by herself and she agreed. ) Nurse Communication: Mobility status PT Visit Diagnosis: Muscle weakness (generalized) (M62.81)    Time: 5093-2671 PT Time Calculation (min) (ACUTE ONLY): 27 min   Charges:   PT Evaluation $PT Eval Low Complexity: 1 Low     PT G Codes:   PT G-Codes **NOT FOR INPATIENT CLASS** Functional Assessment Tool Used: AM-PAC 6 Clicks Basic Mobility;Clinical judgement Functional Limitation: Mobility: Walking and moving around Mobility: Walking and Moving Around Current Status (I4580): At least 1 percent but less than 20 percent impaired, limited or restricted Mobility: Walking and Moving Around Goal Status [id number]): 0 percent impaired, limited or restricted    Clide Dales, PT Pager: [phone] 05/30/2017   Arrie Zuercher, Gatha Mayer 05/30/2017, 5:18 PM

## 2017-05-30 NOTE — Progress Notes (Signed)
Nutrition Brief Note  Patient identified on the Malnutrition Screening Tool (MST) Report  Wt Readings from Last 15 Encounters:  05/30/17 190 lb 8 oz (86.4 kg)  04/03/17 182 lb (82.6 kg)  02/26/17 184 lb 6.4 oz (83.6 kg)  01/01/17 181 lb (82.1 kg)  08/27/16 183 lb (83 kg)  07/18/16 175 lb (79.4 kg)  07/04/16 177 lb 3.2 oz (80.4 kg)  06/15/16 175 lb 12.8 oz (79.7 kg)  05/07/16 173 lb (78.5 kg)  02/27/16 178 lb 12.8 oz (81.1 kg)  02/07/16 188 lb (85.3 kg)  02/03/16 188 lb 6.4 oz (85.5 kg)  02/02/16 179 lb 3.2 oz (81.3 kg)  12/16/15 180 lb (81.6 kg)  10/31/15 184 lb 3.2 oz (83.6 kg)    Body mass index is 35.99 kg/m. Patient meets criteria for obese based on current BMI. Pt weight noted to be stable per last 15 encounters.   Current diet order is carb modifed, patient is consuming approximately 100% of meals at this time. Pt reports having great appetite prior to admission, consuming three meals with Ensure supplements BID.   Labs and medications reviewed.   No nutrition interventions warranted at this time. If nutrition issues arise, please consult RD.   Mariana Single RD, LDN Clinical Nutrition Pager # [phone]

## 2017-05-30 NOTE — Progress Notes (Signed)
PROGRESS NOTE  [name]  TFT:[id number] DOB: 1924-12-21 DOA: 05/29/2017 PCP: [name], MD  Outpatient Specialists: Pulmonology [facility] Buccini, Cardiology [facility] Billings  Brief Narrative: [name] is a 83 y.o. female with a history of asthma, CAD, T2DM, GERD, HTN who presented to the ED 10/10 with dyspnea, wheezing and cough worsening over 4 days. Symptoms typical of prior asthma exacerbations, so she tried nebulizers at home with some, but limited, benefit so she called EMS. On arrival she had increased work of breathing despite nebulizers and steroids, so was admitted.  Assessment & Plan: Active Problems:   Coronary atherosclerosis   HTN (hypertension)   HLD (hyperlipidemia)   Asthma exacerbation   GERD (gastroesophageal reflux disease)   Diabetes mellitus type 2 without retinopathy (HCC)   Acute on chronic respiratory failure (HCC)   Dyspnea  Asthma exacerbation: Unclear precipitant. Had flu vaccine 6 days PTA, though no true risk factors/fevers/sick contacts. RVP negative.  - Continue steroids and inhaled bronchodilators. She's oxygenating but with increased work of breathing. At her age, anticipate slower recovery even with adequate treatment. At high risk for decompensation. If necessary, would consent to NIPPV trial. Otherwise, DNR.  - O2 prn, not currently hypoxemic.   Constipation: Mod stool burden on KUB.  - Bowel regimen ordered, consider enema if no results.  CAD and chronic systolic CHF: No chest pain, ECG nonischemic, troponins neg. D-dimer within normal limits for age.  - Continue ASA/plavix, statin. - Continue ARB, lasix 20mg  daily.  - Metoprolol held on admission. Will restart this to prevent rebound tachycardia. BP also up. This is cardioselective.   T2DM: HCW2B 7.6% - Hold trulicity - SSI  Hyperlipidemia: Stable - Continue home med  HTN: Chronic, stable - Continue home medications, restart BB  GERD: Chronic, stable - Continue home med  Insomnia:  -  Continue seroquel qHS, will hold benzo due to high fall risk.   Low TSH:  - Check free T4.    DVT prophylaxis: Lovenox Code Status: Partial (NIPPV/intubation only if respiratory compromise), if cardiac/respiratory arrest does not want resuscitation Family Communication: None at bedside Disposition Plan: Hopeful for DC back to home environment where she has significant family support  Consultants:   None  Procedures:   None  Antimicrobials:  None   Subjective: Working to breathe more than baseline, improved with breathing treatments. No chest pain, fever. Severe cough nonproductive.   Objective: Vitals:   05/30/17 0812 05/30/17 1011 05/30/17 1039 05/30/17 1338  BP:   (!) 121/56 (!) 155/68  Pulse:   95 100  Resp:   18 20  Temp:   98.5 F (36.9 C) 99.1 F (37.3 C)  TempSrc:   Oral Oral  SpO2: 94%  95% 95%  Weight:  86.4 kg (190 lb 8 oz)      Intake/Output Summary (Last 24 hours) at 05/30/17 1512 Last data filed at 05/30/17 1339  Gross per 24 hour  Intake           916.58 ml  Output                0 ml  Net           916.58 ml   Filed Weights   05/30/17 1011  Weight: 86.4 kg (190 lb 8 oz)    Gen: Elderly female in no distress Pulm: Mildly labored tachypnea. Wheezing throughout prolonged expiration  CV: Regular rate and rhythm. No murmur, rub, or gallop. No JVD, no pedal edema. GI: Abdomen  soft, non-tender, non-distended, with normoactive bowel sounds. No organomegaly or masses felt. Ext: Warm, no deformities Skin: No rashes, lesions no ulcers Neuro: Alert and oriented. No focal neurological deficits. Psych: Judgement and insight appear normal. Mood & affect appropriate.   Data Reviewed: I have personally reviewed following labs and imaging studies  CBC:  Recent Labs Lab 05/29/17 1631 05/30/17 0320  WBC 5.1 5.6  NEUTROABS 2.8  --   HGB 12.1 11.7*  HCT 38.5 37.4  MCV 86.1 86.0  PLT 149* 557   Basic Metabolic Panel:  Recent Labs Lab  05/29/17 1631 05/30/17 0320  NA 141 141  K 4.1 4.2  CL 106 107  CO2 27 25  GLUCOSE 136* 182*  BUN 28* 24*  CREATININE 1.05* 0.96  CALCIUM 9.4 9.1  MG  --  2.1  PHOS  --  3.5   GFR: Estimated Creatinine Clearance: 38.9 mL/min (by C-G formula based on SCr of 0.96 mg/dL). Liver Function Tests:  Recent Labs Lab 05/30/17 0320  AST 22  ALT 20  ALKPHOS 53  BILITOT 0.2*  PROT 6.3*  ALBUMIN 3.4*   No results for input(s): LIPASE, AMYLASE in the last 168 hours. No results for input(s): AMMONIA in the last 168 hours. Coagulation Profile: No results for input(s): INR, PROTIME in the last 168 hours. Cardiac Enzymes:  Recent Labs Lab 05/29/17 1631 05/29/17 2157 05/30/17 0320 05/30/17 1020  TROPONINI <0.03 <0.03 <0.03 <0.03   BNP (last 3 results) No results for input(s): PROBNP in the last 8760 hours. HbA1C:  Recent Labs  05/30/17 0320  HGBA1C 6.6*   CBG:  Recent Labs Lab 05/29/17 2307 05/30/17 0721  GLUCAP 196* 156*   Lipid Profile: No results for input(s): CHOL, HDL, LDLCALC, TRIG, CHOLHDL, LDLDIRECT in the last 72 hours. Thyroid Function Tests:  Recent Labs  05/30/17 0320  TSH 0.208*   Anemia Panel: No results for input(s): VITAMINB12, FOLATE, FERRITIN, TIBC, IRON, RETICCTPCT in the last 72 hours. Urine analysis:    Component Value Date/Time   COLORURINE YELLOW 07/18/2016 2305   APPEARANCEUR CLEAR 07/18/2016 2305   LABSPEC 1.021 07/18/2016 2305   PHURINE 6.0 07/18/2016 2305   GLUCOSEU NEGATIVE 07/18/2016 2305   HGBUR NEGATIVE 07/18/2016 2305   BILIRUBINUR NEGATIVE 07/18/2016 2305   KETONESUR NEGATIVE 07/18/2016 2305   PROTEINUR NEGATIVE 07/18/2016 2305   UROBILINOGEN 0.2 01/18/2014 2313   NITRITE NEGATIVE 07/18/2016 2305   LEUKOCYTESUR SMALL (A) 07/18/2016 2305   Recent Results (from the past 240 hour(s))  Respiratory Panel by PCR     Status: None   Collection Time: 05/29/17 10:56 PM  Result Value Ref Range Status   Adenovirus NOT DETECTED  NOT DETECTED Final   Coronavirus 229E NOT DETECTED NOT DETECTED Final   Coronavirus HKU1 NOT DETECTED NOT DETECTED Final   Coronavirus NL63 NOT DETECTED NOT DETECTED Final   Coronavirus OC43 NOT DETECTED NOT DETECTED Final   Metapneumovirus NOT DETECTED NOT DETECTED Final   Rhinovirus / Enterovirus NOT DETECTED NOT DETECTED Final   Influenza A NOT DETECTED NOT DETECTED Final   Influenza B NOT DETECTED NOT DETECTED Final   Parainfluenza Virus 1 NOT DETECTED NOT DETECTED Final   Parainfluenza Virus 2 NOT DETECTED NOT DETECTED Final   Parainfluenza Virus 3 NOT DETECTED NOT DETECTED Final   Parainfluenza Virus 4 NOT DETECTED NOT DETECTED Final   Respiratory Syncytial Virus NOT DETECTED NOT DETECTED Final   Bordetella pertussis NOT DETECTED NOT DETECTED Final   Chlamydophila pneumoniae NOT DETECTED NOT DETECTED  Final   Mycoplasma pneumoniae NOT DETECTED NOT DETECTED Final    Comment: Performed at [facility], [place] [address], [place], [place] [address]      Radiology Studies: Dg Chest 2 View  Result Date: 05/29/2017 CLINICAL DATA:  83 year old female with a history of cough and wheeze EXAM: CHEST  2 VIEW COMPARISON:  01/24/2016 FINDINGS: Cardiomediastinal silhouette unchanged in size and contour. No evidence of central vascular congestion. No pneumothorax. No confluent airspace disease. No pleural effusion. Coarsened interstitial markings, similar to the comparison studies. No displaced fracture. IMPRESSION: Chronic lung changes without evidence of acute cardiopulmonary disease. Electronically Signed   By: Corrie Mckusick D.O.   On: 05/29/2017 18:13   Dg Abd 1 View  Result Date: 05/29/2017 CLINICAL DATA:  Patient for 1 week, history of obstruction EXAM: ABDOMEN - 1 VIEW COMPARISON:  CT abdomen and pelvis 11/02/2016 FINDINGS: Normal bowel gas pattern. Scattered stool throughout colon. No evidence of bowel obstruction or wall thickening. Bones demineralized. No urine tract calcification.  IMPRESSION: Nonobstructive bowel gas pattern. Electronically Signed   By: Lavonia Dana M.D.   On: 05/29/2017 22:50    Scheduled Meds: . aspirin EC  81 mg Oral QHS  . clopidogrel  75 mg Oral QPM  . enoxaparin (LOVENOX) injection  30 mg Subcutaneous QHS  . insulin aspart  0-15 Units Subcutaneous TID WC  . insulin aspart  0-5 Units Subcutaneous QHS  . ipratropium-albuterol  3 mL Nebulization TID  . irbesartan  150 mg Oral Daily  . metoCLOPramide  5 mg Oral TID AC  . pantoprazole  40 mg Oral BID  . predniSONE  40 mg Oral Q supper  . QUEtiapine  25 mg Oral QHS  . rosuvastatin  20 mg Oral Daily  . senna  1 tablet Oral BID  . sucralfate  1 g Oral TID WC & HS   Continuous Infusions: . sodium chloride 10 mL/hr (05/30/17 1312)     LOS: 1 day   Time spent: 25 minutes.  Vance Gather, MD [facility] Pager [phone]  If 7PM-7AM, please contact night-coverage [email or web address] Password [email or web address] 05/30/2017, 3:12 PM

## 2017-05-30 NOTE — Evaluation (Signed)
Occupational Therapy Evaluation Patient Details Name: [name] MRN: [id number] DOB: [birth year] Today's Date: 05/30/2017    History of Present Illness 83 y.o. female with medical history significant of Asthma on home O2, R TKA, exploratory laparotomy 2* SBO 01/2016, CAD, DM2, bronchitis, GERD, HTN. Dx asthma exacerbation   Clinical Impression   Pt admitted with asthma exacerbation. Pt currently with functional limitations due to the deficits listed below (see OT Problem List). Pt will benefit from skilled OT to increase their safety and independence with ADL and functional mobility for ADL to facilitate discharge to venue listed below.      Follow Up Recommendations  Home health OT;Supervision/Assistance - 24 hour    Equipment Recommendations  None recommended by OT    Recommendations for Other Services       Precautions / Restrictions Precautions Precautions: Fall      Mobility Bed Mobility Overal bed mobility: Needs Assistance Bed Mobility: Supine to Sit     Supine to sit: Min assist     General bed mobility comments: increased time and use of bedrail  Transfers Overall transfer level: Needs assistance Equipment used: Rolling walker (2 wheeled) Transfers: Sit to/from [facility] Sit to Stand: Mod assist;+2 safety/equipment Stand pivot transfers: Min assist;+2 safety/equipment                ADL either performed or assessed with clinical judgement   ADL Overall ADL's : Needs assistance/impaired Eating/Feeding: Set up;Sitting   Grooming: Set up;Sitting   Upper Body Bathing: Minimal assistance;Sitting   Lower Body Bathing: Moderate assistance;Sit to/from stand;Cueing for safety;Cueing for sequencing   Upper Body Dressing : Minimal assistance;Sitting   Lower Body Dressing: Moderate assistance;Sit to/from stand;Cueing for safety;Cueing for sequencing   Toilet Transfer: Minimal assistance;+2 for safety/equipment;RW;Ambulation;Cueing  for sequencing;Cueing for safety Toilet Transfer Details (indicate cue type and reason): bed to chair Toileting- Clothing Manipulation and Hygiene: Moderate assistance;Sit to/from stand;Cueing for safety;Cueing for sequencing         General ADL Comments: pt accustomed to wheel walker. son and other family will A as needed     Vision Patient Visual Report: No change from baseline              Pertinent Vitals/Pain Pain Assessment: No/denies pain     Hand Dominance     Extremity/Trunk Assessment Upper Extremity Assessment Upper Extremity Assessment: Generalized weakness           Communication Communication Communication: No difficulties   Cognition Arousal/Alertness: Awake/alert Behavior During Therapy: WFL for tasks assessed/performed Overall Cognitive Status: Within Functional Limits for tasks assessed                                                Home Living Family/patient expects to be discharged to:: Private residence Living Arrangements: Children Available Help at Discharge: Family Type of Home: House Home Access: Stairs to enter     Home Layout: One level     Bathroom Shower/Tub:  (pt perfoms sink bath)   Bathroom Toilet: Standard     Home Equipment: Environmental consultant - 4 wheels          Prior Functioning/Environment Level of Independence: Independent with assistive device(s)        Comments: son assists with meals, groceries and laundry        OT Problem List: Decreased strength;Decreased activity  tolerance;Decreased knowledge of use of DME or AE;Impaired balance (sitting and/or standing)      OT Treatment/Interventions: Self-care/ADL training;Patient/family education;DME and/or AE instruction    OT Goals(Current goals can be found in the care plan section) Acute Rehab OT Goals Patient Stated Goal: home with son to A OT Goal Formulation: With patient Time For Goal Achievement: 06/13/17 Potential to Achieve Goals: Good   OT Frequency: Min 2X/week    AM-PAC PT "6 Clicks" Daily Activity     Outcome Measure Help from another person eating meals?: None Help from another person taking care of personal grooming?: A Little Help from another person toileting, which includes using toliet, bedpan, or urinal?: A Lot Help from another person bathing (including washing, rinsing, drying)?: A Lot Help from another person to put on and taking off regular upper body clothing?: A Little Help from another person to put on and taking off regular lower body clothing?: A Lot 6 Click Score: 16   End of Session Equipment Utilized During Treatment: Rolling walker Nurse Communication: Mobility status  Activity Tolerance: Patient tolerated treatment well Patient left: in chair;with chair alarm set  OT Visit Diagnosis: Unsteadiness on feet (R26.81);Muscle weakness (generalized) (M62.81)                Time: 0355-9741 OT Time Calculation (min): 22 min Charges:  OT General Charges $OT Visit: 1 Visit OT Evaluation $OT Eval Moderate Complexity: 1 Mod G-Codes:     Kari Baars, OT [phone]  Payton Mccallum D 05/30/2017, 5:06 PM

## 2017-05-30 NOTE — Care Management Note (Signed)
Case Management Note  Patient Details  Name: [name] MRN: [id number] Date of Birth: 1924/10/11  Subjective/Objective:                  asthma  Action/Plan: Date:  May 30, 2017 Chart reviewed for concurrent status and case management needs.  Will continue to follow patient progress.  Discharge Planning: following for needs  Expected discharge date: [id number]  [name], BSN, [place], [place]   Expected Discharge Date:   (unknown)               Expected Discharge Plan:  Home/Self Care  In-House Referral:     Discharge planning Services  CM Consult  Post Acute Care Choice:    Choice offered to:     DME Arranged:    DME Agency:     HH Arranged:    [place] Agency:     Status of Service:  In process, will continue to follow  If discussed at Long Length of Stay Meetings, dates discussed:    Additional Comments:  Leeroy Cha, RN 05/30/2017, 8:57 AM

## 2017-05-31 DIAGNOSIS — J9621 Acute and chronic respiratory failure with hypoxia: Secondary | ICD-10-CM

## 2017-05-31 LAB — GLUCOSE, CAPILLARY
GLUCOSE-CAPILLARY: 158 mg/dL — AB (ref 65–99)
Glucose-Capillary: 193 mg/dL — ABNORMAL HIGH (ref 65–99)
Glucose-Capillary: 109 mg/dL — ABNORMAL HIGH (ref 65–99)
Glucose-Capillary: 164 mg/dL — ABNORMAL HIGH (ref 65–99)

## 2017-05-31 MED ORDER — BENZONATATE 100 MG PO CAPS
200.0000 mg | ORAL_CAPSULE | Freq: Two times a day (BID) | ORAL | Status: DC
  Administered 2017-05-31 – 2017-06-06 (×13): 200 mg via ORAL
  Filled 2017-05-31 (×13): qty 2

## 2017-05-31 MED ORDER — GUAIFENESIN 100 MG/5ML PO SOLN
5.0000 mL | ORAL | Status: DC | PRN
Start: 2017-05-31 — End: 2017-06-06

## 2017-05-31 NOTE — Progress Notes (Deleted)
Pt. Placed on Valatie 2 lpm for saturations of 86% on room air.

## 2017-05-31 NOTE — Progress Notes (Signed)
PROGRESS NOTE  [name]  EYC:[id number] DOB: [birth year] DOA: 05/29/2017  PCP: [name], MD   Outpatient Specialists: Pulmonology [facility]ccini, Cardiology [facility]   Brief Narrative: 83 y.o. female with a history of asthma, CAD, T2DM, GERD, HTN who presented to the ED 10/10 with dyspnea, wheezing and cough worsening over 4 days. Symptoms typical of prior asthma exacerbations, so she tried nebulizers at home with some, but limited, benefit so she called EMS. On arrival she had increased work of breathing despite nebulizers and steroids and was admitted.  Assessment & Plan:  Asthma exacerbation: Unclear precipitant. Had flu vaccine 6 days PTA, though no true risk factors/fevers/sick contacts. RVP negative.  - cont BD scheduled and as needed - cont Prednisone taper - add robitussin as needed - if not better, will ask for CT chest for clearer evaluation   Constipation: Mod stool burden on KUB.  - had BM yesterday   CAD and chronic systolic CHF: No chest pain, ECG nonischemic, troponins neg. D-dimer within normal limits for age.  - Continue ASA/plavix, statin. - Continue ARB, lasix 20mg  daily.  - restarted Metoprolol 10/11  T2DM: JSH7W 2.6% - Hold trulicity - keep on SSI  Hyperlipidemia: Stable - continue statin   HTN: Chronic, stable - Continue home medications, irbesartan, lasix, metoprolol   GERD: Chronic, stable - Continue home med  Insomnia - Continue seroquel qHS - agree with holding benzos for now   Low TSH - fT4 pending    DVT prophylaxis: Lovenox SQ Code Status: Partial (NIPPV/intubation only if respiratory compromise), if cardiac/respiratory arrest does not want resuscitation Family Communication: pt at bedside  Disposition Plan: home in 1-2 days   Consultants:   None  Procedures:   None  Antimicrobials:  None   Subjective: Reports feeling better but still with dry cough and hard to get out.  Objective: Vitals:   05/30/17 2215 05/31/17  0008 05/31/17 0442 05/31/17 0908  BP:  138/66 (!) 144/79   Pulse:  93 95   Resp:  18 18   Temp:  98 F (36.7 C) 97.7 F (36.5 C)   TempSrc:  Oral Oral   SpO2: 94% 93% 96% 99%  Weight:      Height:        Intake/Output Summary (Last 24 hours) at 05/31/17 1139 Last data filed at 05/30/17 1839  Gross per 24 hour  Intake              240 ml  Output                0 ml  Net              240 ml   Filed Weights   05/30/17 1011 05/30/17 1709  Weight: 86.4 kg (190 lb 8 oz) 86.4 kg (190 lb 7.6 oz)    Physical Exam  Constitutional: Appears calm, NAD CVS: RRR, S1/S2 +, no murmurs, no gallops, no carotid bruit.  Pulmonary: exp wheezing, mild rhonchi at bases  Abdominal: Soft. BS +,  no distension, tenderness, rebound or guarding.  Musculoskeletal: Normal range of motion. No edema and no tenderness.  Neuro: Alert. Normal reflexes, muscle tone coordination. No cranial nerve deficit.  Data Reviewed: I have personally reviewed following labs and imaging studies  CBC:  Recent Labs Lab 05/29/17 1631 05/30/17 0320  WBC 5.1 5.6  NEUTROABS 2.8  --   HGB 12.1 11.7*  HCT 38.5 37.4  MCV 86.1 86.0  PLT 149* 170  Basic Metabolic Panel:  Recent Labs Lab 05/29/17 1631 05/30/17 0320  NA 141 141  K 4.1 4.2  CL 106 107  CO2 27 25  GLUCOSE 136* 182*  BUN 28* 24*  CREATININE 1.05* 0.96  CALCIUM 9.4 9.1  MG  --  2.1  PHOS  --  3.5   Liver Function Tests:  Recent Labs Lab 05/30/17 0320  AST 22  ALT 20  ALKPHOS 53  BILITOT 0.2*  PROT 6.3*  ALBUMIN 3.4*   Cardiac Enzymes:  Recent Labs Lab 05/29/17 1631 05/29/17 2157 05/30/17 0320 05/30/17 1020  TROPONINI <0.03 <0.03 <0.03 <0.03   HbA1C:  Recent Labs  05/30/17 0320  HGBA1C 6.6*   CBG:  Recent Labs Lab 05/30/17 0721 05/30/17 1124 05/30/17 1705 05/30/17 2108 05/31/17 0741  GLUCAP 156* 180* 191* 254* 158*   Thyroid Function Tests:  Recent Labs  05/30/17 0320  TSH 0.208*   Urine analysis:      Component Value Date/Time   COLORURINE YELLOW 07/18/2016 2305   APPEARANCEUR CLEAR 07/18/2016 2305   LABSPEC 1.021 07/18/2016 2305   PHURINE 6.0 07/18/2016 2305   GLUCOSEU NEGATIVE 07/18/2016 2305   HGBUR NEGATIVE 07/18/2016 2305   BILIRUBINUR NEGATIVE 07/18/2016 2305   KETONESUR NEGATIVE 07/18/2016 2305   PROTEINUR NEGATIVE 07/18/2016 2305   UROBILINOGEN 0.2 01/18/2014 2313   NITRITE NEGATIVE 07/18/2016 2305   LEUKOCYTESUR SMALL (A) 07/18/2016 2305   Recent Results (from the past 240 hour(s))  Respiratory Panel by PCR     Status: None   Collection Time: 05/29/17 10:56 PM  Result Value Ref Range Status   Adenovirus NOT DETECTED NOT DETECTED Final   Coronavirus 229E NOT DETECTED NOT DETECTED Final   Coronavirus HKU1 NOT DETECTED NOT DETECTED Final   Coronavirus NL63 NOT DETECTED NOT DETECTED Final   Coronavirus OC43 NOT DETECTED NOT DETECTED Final   Metapneumovirus NOT DETECTED NOT DETECTED Final   Rhinovirus / Enterovirus NOT DETECTED NOT DETECTED Final   Influenza A NOT DETECTED NOT DETECTED Final   Influenza B NOT DETECTED NOT DETECTED Final   Parainfluenza Virus 1 NOT DETECTED NOT DETECTED Final   Parainfluenza Virus 2 NOT DETECTED NOT DETECTED Final   Parainfluenza Virus 3 NOT DETECTED NOT DETECTED Final   Parainfluenza Virus 4 NOT DETECTED NOT DETECTED Final   Respiratory Syncytial Virus NOT DETECTED NOT DETECTED Final   Bordetella pertussis NOT DETECTED NOT DETECTED Final   Chlamydophila pneumoniae NOT DETECTED NOT DETECTED Final   Mycoplasma pneumoniae NOT DETECTED NOT DETECTED Final    Comment: Performed at [facility], [place] [address] [place], [place] [address]      Radiology Studies: Dg Chest 2 View  Result Date: 05/29/2017 CLINICAL DATA:  83 year old female with a history of cough and wheeze EXAM: CHEST  2 VIEW COMPARISON:  01/24/2016 FINDINGS: Cardiomediastinal silhouette unchanged in size and contour. No evidence of central vascular congestion. No  pneumothorax. No confluent airspace disease. No pleural effusion. Coarsened interstitial markings, similar to the comparison studies. No displaced fracture. IMPRESSION: Chronic lung changes without evidence of acute cardiopulmonary disease. Electronically Signed   By: Corrie Mckusick D.O.   On: 05/29/2017 18:13   Dg Abd 1 View  Result Date: 05/29/2017 CLINICAL DATA:  Patient for 1 week, history of obstruction EXAM: ABDOMEN - 1 VIEW COMPARISON:  CT abdomen and pelvis 11/02/2016 FINDINGS: Normal bowel gas pattern. Scattered stool throughout colon. No evidence of bowel obstruction or wall thickening. Bones demineralized. No urine tract calcification. IMPRESSION: Nonobstructive bowel  gas pattern. Electronically Signed   By: Lavonia Dana M.D.   On: 05/29/2017 22:50    Scheduled Meds: . aspirin EC  81 mg Oral QHS  . clopidogrel  75 mg Oral QPM  . enoxaparin (LOVENOX) injection  30 mg Subcutaneous QHS  . furosemide  20 mg Oral Daily  . insulin aspart  0-15 Units Subcutaneous TID WC  . insulin aspart  0-5 Units Subcutaneous QHS  . ipratropium-albuterol  3 mL Nebulization TID  . irbesartan  150 mg Oral Daily  . metoCLOPramide  5 mg Oral TID AC  . metoprolol succinate  100 mg Oral Daily  . metoprolol succinate  50 mg Oral QHS  . pantoprazole  40 mg Oral BID  . predniSONE  40 mg Oral Q supper  . QUEtiapine  25 mg Oral QHS  . rosuvastatin  20 mg Oral Daily  . senna  1 tablet Oral BID  . sodium chloride flush  3 mL Intravenous Q12H  . sucralfate  1 g Oral TID WC & HS   Continuous Infusions: . sodium chloride       LOS: 2 days   Time spent: 25 minutes.  Faye Ramsay, MD [facility] Pager [phone]  If 7PM-7AM, please contact night-coverage [email or web address] Password [email or web address] 05/31/2017, 11:39 AM

## 2017-06-01 LAB — CBC
HCT: 41.3 % (ref 36.0–46.0)
HEMOGLOBIN: 12.7 g/dL (ref 12.0–15.0)
MCH: 26.4 pg (ref 26.0–34.0)
MCHC: 30.8 g/dL (ref 30.0–36.0)
MCV: 85.9 fL (ref 78.0–100.0)
Platelets: 208 10*3/uL (ref 150–400)
RBC: 4.81 MIL/uL (ref 3.87–5.11)
RDW: 14.6 % (ref 11.5–15.5)
WBC: 8.8 10*3/uL (ref 4.0–10.5)

## 2017-06-01 LAB — BASIC METABOLIC PANEL
ANION GAP: 10 (ref 5–15)
BUN: 30 mg/dL — ABNORMAL HIGH (ref 6–20)
CHLORIDE: 107 mmol/L (ref 101–111)
CO2: 25 mmol/L (ref 22–32)
Calcium: 9.3 mg/dL (ref 8.9–10.3)
Creatinine, Ser: 0.87 mg/dL (ref 0.44–1.00)
GFR calc non Af Amer: 57 mL/min — ABNORMAL LOW (ref >60)
Glucose, Bld: 174 mg/dL — ABNORMAL HIGH (ref 65–99)
POTASSIUM: 4.1 mmol/L (ref 3.5–5.1)
SODIUM: 142 mmol/L (ref 135–145)

## 2017-06-01 LAB — GLUCOSE, CAPILLARY
GLUCOSE-CAPILLARY: 168 mg/dL — AB (ref 65–99)
GLUCOSE-CAPILLARY: 152 mg/dL — AB (ref 65–99)
GLUCOSE-CAPILLARY: 186 mg/dL — AB (ref 65–99)
Glucose-Capillary: 118 mg/dL — ABNORMAL HIGH (ref 65–99)

## 2017-06-01 MED ORDER — AZITHROMYCIN 600 MG PO TABS: 600.0000 mg | ORAL_TABLET | Freq: Every day | ORAL | Status: DC

## 2017-06-01 MED ORDER — DOXYCYCLINE HYCLATE 100 MG PO TABS
100.0000 mg | ORAL_TABLET | Freq: Two times a day (BID) | ORAL | Status: DC
  Administered 2017-06-01 – 2017-06-06 (×11): 100 mg via ORAL
  Filled 2017-06-01 (×11): qty 1

## 2017-06-01 NOTE — Progress Notes (Addendum)
PROGRESS NOTE  [name]  VPX:[id number] DOB: [birth year] DOA: 05/29/2017  PCP: [name], MD   Outpatient Specialists: Pulmonology Sullivan Lone Buccini, Cardiology Claiborne Billings   Brief Narrative: 83 y.o. female with a history of asthma, CAD, T2DM, GERD, HTN who presented to the ED 10/10 with dyspnea, wheezing and cough worsening over 4 days. Symptoms typical of prior asthma exacerbations, so she tried nebulizers at home with some, but limited, benefit so she called EMS. On arrival she had increased work of breathing despite nebulizers and steroids and was admitted.  Assessment & Plan:  Asthma exacerbation: Unclear precipitant. Had flu vaccine 6 days PTA, though no true risk factors/fevers/sick contacts. RVP negative.  - pt clinically improving but still with intermittent episodes of non productive and productive cough  - cont BD scheduled and as needed - cont Prednisone taper - added robitussin as needed - will place on empiric Doxycycline as well for now  Constipation: Mod stool burden on KUB.  - has had BM  CAD and chronic systolic CHF: No chest pain, ECG nonischemic, troponins neg. D-dimer within normal limits for age.  - Continue ASA/plavix, statin. - Continue ARB, lasix 20mg  daily.  - restarted Metoprolol 10/11  T2DM: IOE7O 3.5% - Hold trulicity - keep on SSI  Hyperlipidemia: Stable - continue statin   HTN: Chronic, stable - stable BP - if persistent cough, may need to hold ARB  GERD: Chronic, stable - Continue home med  Insomnia - Continue seroquel qHS - pt denies concerns regarding insomnia   Low TSH - fT4 pending   DVT prophylaxis: Lovenox SQ Code Status: Partial (NIPPV/intubation only if respiratory compromise), if cardiac/respiratory arrest does not want resuscitation Family Communication: pt and son at bedside  Disposition Plan: home in 1-2 days   Consultants:   None  Procedures:   None  Antimicrobials:  Doxycycline 10/13 -->  Subjective: Pt  reports feeling better but still bothered with cough.   Objective: Vitals:   06/01/17 0625 06/01/17 0841 06/01/17 1000 06/01/17 1320  BP: (!) 155/79  (!) 147/67 136/77  Pulse:   97 95  Resp:   (!) 21 18  Temp:   98.6 F (37 C) 98.7 F (37.1 C)  TempSrc:   Oral Oral  SpO2:  97% 96% 96%  Weight:      Height:        Intake/Output Summary (Last 24 hours) at 06/01/17 1408 Last data filed at 06/01/17 1321  Gross per 24 hour  Intake              360 ml  Output                0 ml  Net              360 ml   Filed Weights   05/30/17 1011 05/30/17 1709  Weight: 86.4 kg (190 lb 8 oz) 86.4 kg (190 lb 7.6 oz)    Physical Exam  Constitutional: Appears NAD, calm CVS: RRR, S1/S2 +, no murmurs, no gallops, no carotid bruit.  Pulmonary: Effort and breath sounds normal, mild rhonchi at bases and exp wheezing  Abdominal: Soft. BS +,  no distension, tenderness, rebound or guarding.  Neuro: Alert. Normal reflexes, muscle tone coordination. No cranial nerve deficit. Psychiatric: Normal mood and affect. Behavior, judgment, thought content normal.   Data Reviewed: I have personally reviewed following labs and imaging studies  CBC:  Recent Labs Lab 05/29/17 1631 05/30/17 0320 06/01/17 0531  WBC 5.1 5.6  8.8  NEUTROABS 2.8  --   --   HGB 12.1 11.7* 12.7  HCT 38.5 37.4 41.3  MCV 86.1 86.0 85.9  PLT 149* 170 109   Basic Metabolic Panel:  Recent Labs Lab 05/29/17 1631 05/30/17 0320 06/01/17 0531  NA 141 141 142  K 4.1 4.2 4.1  CL 106 107 107  CO2 27 25 25   GLUCOSE 136* 182* 174*  BUN 28* 24* 30*  CREATININE 1.05* 0.96 0.87  CALCIUM 9.4 9.1 9.3  MG  --  2.1  --   PHOS  --  3.5  --    Liver Function Tests:  Recent Labs Lab 05/30/17 0320  AST 22  ALT 20  ALKPHOS 53  BILITOT 0.2*  PROT 6.3*  ALBUMIN 3.4*   Cardiac Enzymes:  Recent Labs Lab 05/29/17 1631 05/29/17 2157 05/30/17 0320 05/30/17 1020  TROPONINI <0.03 <0.03 <0.03 <0.03   HbA1C:  Recent Labs   05/30/17 0320  HGBA1C 6.6*   CBG:  Recent Labs Lab 05/31/17 1154 05/31/17 1718 05/31/17 2108 06/01/17 0710 06/01/17 1131  GLUCAP 193* 109* 164* 168* 152*   Thyroid Function Tests:  Recent Labs  05/30/17 0320  TSH 0.208*   Urine analysis:    Component Value Date/Time   COLORURINE YELLOW 07/18/2016 2305   APPEARANCEUR CLEAR 07/18/2016 2305   LABSPEC 1.021 07/18/2016 2305   PHURINE 6.0 07/18/2016 2305   GLUCOSEU NEGATIVE 07/18/2016 2305   HGBUR NEGATIVE 07/18/2016 2305   BILIRUBINUR NEGATIVE 07/18/2016 2305   KETONESUR NEGATIVE 07/18/2016 2305   PROTEINUR NEGATIVE 07/18/2016 2305   UROBILINOGEN 0.2 01/18/2014 2313   NITRITE NEGATIVE 07/18/2016 2305   LEUKOCYTESUR SMALL (A) 07/18/2016 2305   Recent Results (from the past 240 hour(s))  Respiratory Panel by PCR     Status: None   Collection Time: 05/29/17 10:56 PM  Result Value Ref Range Status   Adenovirus NOT DETECTED NOT DETECTED Final   Coronavirus 229E NOT DETECTED NOT DETECTED Final   Coronavirus HKU1 NOT DETECTED NOT DETECTED Final   Coronavirus NL63 NOT DETECTED NOT DETECTED Final   Coronavirus OC43 NOT DETECTED NOT DETECTED Final   Metapneumovirus NOT DETECTED NOT DETECTED Final   Rhinovirus / Enterovirus NOT DETECTED NOT DETECTED Final   Influenza A NOT DETECTED NOT DETECTED Final   Influenza B NOT DETECTED NOT DETECTED Final   Parainfluenza Virus 1 NOT DETECTED NOT DETECTED Final   Parainfluenza Virus 2 NOT DETECTED NOT DETECTED Final   Parainfluenza Virus 3 NOT DETECTED NOT DETECTED Final   Parainfluenza Virus 4 NOT DETECTED NOT DETECTED Final   Respiratory Syncytial Virus NOT DETECTED NOT DETECTED Final   Bordetella pertussis NOT DETECTED NOT DETECTED Final   Chlamydophila pneumoniae NOT DETECTED NOT DETECTED Final   Mycoplasma pneumoniae NOT DETECTED NOT DETECTED Final    Comment: Performed at [facility], [place] [address], [place], [place] [address]     Radiology Studies: No results  found.  Scheduled Meds: . aspirin EC  81 mg Oral QHS  . benzonatate  200 mg Oral BID  . clopidogrel  75 mg Oral QPM  . enoxaparin (LOVENOX) injection  30 mg Subcutaneous QHS  . furosemide  20 mg Oral Daily  . insulin aspart  0-15 Units Subcutaneous TID WC  . insulin aspart  0-5 Units Subcutaneous QHS  . ipratropium-albuterol  3 mL Nebulization TID  . irbesartan  150 mg Oral Daily  . metoCLOPramide  5 mg Oral TID AC  . metoprolol succinate  100  mg Oral Daily  . metoprolol succinate  50 mg Oral QHS  . pantoprazole  40 mg Oral BID  . predniSONE  40 mg Oral Q supper  . QUEtiapine  25 mg Oral QHS  . rosuvastatin  20 mg Oral Daily  . senna  1 tablet Oral BID  . sodium chloride flush  3 mL Intravenous Q12H  . sucralfate  1 g Oral TID WC & HS   Continuous Infusions: . sodium chloride       LOS: 3 days   Time spent: 25 minutes.  Faye Ramsay, MD [facility] Pager [phone]  If 7PM-7AM, please contact night-coverage [email or web address] Password [email or web address] 06/01/2017, 2:08 PM

## 2017-06-01 NOTE — Progress Notes (Signed)
Occupational Therapy Treatment Patient Details Name: [name] MRN: [id number] DOB: 26-Jan-1925 Today's Date: 06/01/2017    History of present illness 83 y.o. female with medical history significant of Asthma, R TKA, exploratory laparotomy 2* SBO 01/2016, CAD, DM2, bronchitis, GERD, HTN. Dx asthma exacerbation   OT comments  Pt making improvements toward increasing I. Pt states family will A her as needed  Follow Up Recommendations  Home health OT;Supervision/Assistance - 24 hour    Equipment Recommendations  None recommended by OT    Recommendations for Other Services      Precautions / Restrictions Precautions Precautions: Fall       Mobility Bed Mobility               General bed mobility comments: pt OOB  Transfers Overall transfer level: Needs assistance Equipment used: Rolling walker (2 wheeled) Transfers: Sit to/from Stand Sit to Stand: Supervision;Min guard Stand pivot transfers: Min guard;Supervision                ADL either performed or assessed with clinical judgement   ADL                           Toilet Transfer: Supervision/safety;RW;Ambulation   Toileting- Water quality scientist and Hygiene: Supervision/safety;Sit to/from stand;Cueing for safety;Cueing for sequencing       Functional mobility during ADLs: Min guard;Cueing for sequencing;Cueing for safety General ADL Comments: family will A as needed               Cognition Arousal/Alertness: Awake/alert Behavior During Therapy: WFL for tasks assessed/performed Overall Cognitive Status: Within Functional Limits for tasks assessed                                                     Pertinent Vitals/ Pain       Pain Assessment: No/denies pain         Frequency  Min 2X/week        Progress Toward Goals  OT Goals(current goals can now be found in the care plan section)  Progress towards OT goals: Progressing toward goals     Plan  Discharge plan remains appropriate       AM-PAC PT "6 Clicks" Daily Activity     Outcome Measure   Help from another person eating meals?: None Help from another person taking care of personal grooming?: A Little Help from another person toileting, which includes using toliet, bedpan, or urinal?: A Lot Help from another person bathing (including washing, rinsing, drying)?: A Lot Help from another person to put on and taking off regular upper body clothing?: A Little Help from another person to put on and taking off regular lower body clothing?: A Lot 6 Click Score: 16    End of Session Equipment Utilized During Treatment: Rolling walker  OT Visit Diagnosis: Unsteadiness on feet (R26.81);Muscle weakness (generalized) (M62.81)   Activity Tolerance Patient tolerated treatment well   Patient Left in chair;with chair alarm set   Nurse Communication Mobility status        Time: 7371-0626 OT Time Calculation (min): 14 min  Charges: OT General Charges $OT Visit: 1 Visit OT Treatments $Self Care/Home Management : 8-22 mins  [place], [place]   [name] 06/01/2017, 11:33 AM

## 2017-06-02 ENCOUNTER — Inpatient Hospital Stay (HOSPITAL_COMMUNITY): Payer: Medicare Other

## 2017-06-02 LAB — CBC
HCT: 40 % (ref 36.0–46.0)
HEMOGLOBIN: 12.7 g/dL (ref 12.0–15.0)
MCH: 27 pg (ref 26.0–34.0)
MCHC: 31.8 g/dL (ref 30.0–36.0)
MCV: 85.1 fL (ref 78.0–100.0)
Platelets: 186 10*3/uL (ref 150–400)
RBC: 4.7 MIL/uL (ref 3.87–5.11)
RDW: 14.5 % (ref 11.5–15.5)
WBC: 6.5 10*3/uL (ref 4.0–10.5)

## 2017-06-02 LAB — BASIC METABOLIC PANEL
Anion gap: 9 (ref 5–15)
BUN: 26 mg/dL — AB (ref 6–20)
CHLORIDE: 107 mmol/L (ref 101–111)
CO2: 27 mmol/L (ref 22–32)
CREATININE: 0.86 mg/dL (ref 0.44–1.00)
Calcium: 8.9 mg/dL (ref 8.9–10.3)
GFR calc Af Amer: >60 mL/min (ref >60)
GFR calc non Af Amer: 58 mL/min — ABNORMAL LOW (ref >60)
GLUCOSE: 142 mg/dL — AB (ref 65–99)
Potassium: 3.8 mmol/L (ref 3.5–5.1)
SODIUM: 143 mmol/L (ref 135–145)

## 2017-06-02 LAB — GLUCOSE, CAPILLARY
GLUCOSE-CAPILLARY: 128 mg/dL — AB (ref 65–99)
Glucose-Capillary: 118 mg/dL — ABNORMAL HIGH (ref 65–99)
Glucose-Capillary: 123 mg/dL — ABNORMAL HIGH (ref 65–99)
Glucose-Capillary: 205 mg/dL — ABNORMAL HIGH (ref 65–99)

## 2017-06-02 NOTE — Progress Notes (Signed)
PROGRESS NOTE  [name]  HMC:[id number] DOB: Oct 30, 1924 DOA: 05/29/2017  PCP: [name], MD   Outpatient Specialists: Pulmonology [facility] Lone Buccini, Cardiology Claiborne Billings   Brief Narrative: 83 y.o. female with a history of asthma, CAD, T2DM, GERD, HTN who presented to the ED 10/10 with dyspnea, wheezing and cough worsening over 4 days. Symptoms typical of prior asthma exacerbations, so she tried nebulizers at home with some, but limited, benefit so she called EMS. On arrival she had increased work of breathing despite nebulizers and steroids and was admitted.  Assessment & Plan:  Asthma exacerbation: Unclear precipitant. Had flu vaccine 6 days PTA, though no true risk factors/fevers/sick contacts. RVP negative.  - pt clinically improving but still with intermittent episodes of non productive and productive cough  - cont BD scheduled and as needed - cont Prednisone taper - continue empiric doxycycline   Constipation: Mod stool burden on KUB.  - has had BM  CAD and chronic systolic CHF: No chest pain, ECG nonischemic, troponins neg. D-dimer within normal limits for age.  - Continue ASA/plavix, statin. - Continue ARB, lasix 20mg  daily.  - restarted Metoprolol 10/11  T2DM: MOQ9U 7.6% - Hold trulicity - keep on SSI for now   Hyperlipidemia: Stable - continue statin   HTN: Chronic, stable - stable BP - if persistent cough, may need to hold ARB  GERD: Chronic, stable - Continue home med  Insomnia - Continue seroquel qHS - pt denies concerns regarding insomnia   Low TSH - fT4 pending   DVT prophylaxis: Lovenox SQ Code Status: Partial (NIPPV/intubation only if respiratory compromise), if cardiac/respiratory arrest does not want resuscitation Family Communication: pt and son at bedside  Disposition Plan: home in 1-2 days   Consultants:   None  Procedures:   None  Antimicrobials:  Doxycycline 10/13 -->  Subjective: Pt reports feeling better.    Objective: Vitals:   06/01/17 2120 06/02/17 0538 06/02/17 0859 06/02/17 1317  BP:  (!) 150/81  (!) 151/73  Pulse:  84  (!) 106  Resp:  18  18  Temp:  98 F (36.7 C)  98.2 F (36.8 C)  TempSrc:  Oral  Oral  SpO2: 94% 96% 95% 96%  Weight:      Height:        Intake/Output Summary (Last 24 hours) at 06/02/17 1354 Last data filed at 06/02/17 1214  Gross per 24 hour  Intake              600 ml  Output                0 ml  Net              600 ml   Filed Weights   05/30/17 1011 05/30/17 1709  Weight: 86.4 kg (190 lb 8 oz) 86.4 kg (190 lb 7.6 oz)    Physical Exam  Constitutional: Appears calm, NAD CVS: RRR, S1/S2 +, no murmurs, no gallops, no carotid bruit.  Pulmonary: Effort and breath sounds normal, mild rales at bases  Abdominal: Soft. BS +,  no distension, tenderness, rebound or guarding.   Data Reviewed: I have personally reviewed following labs and imaging studies  CBC:  Recent Labs Lab 05/29/17 1631 05/30/17 0320 06/01/17 0531 06/02/17 0610  WBC 5.1 5.6 8.8 6.5  NEUTROABS 2.8  --   --   --   HGB 12.1 11.7* 12.7 12.7  HCT 38.5 37.4 41.3 40.0  MCV 86.1 86.0 85.9 85.1  PLT 149*  170 208 323   Basic Metabolic Panel:  Recent Labs Lab 05/29/17 1631 05/30/17 0320 06/01/17 0531 06/02/17 0610  NA 141 141 142 143  K 4.1 4.2 4.1 3.8  CL 106 107 107 107  CO2 27 25 25 27   GLUCOSE 136* 182* 174* 142*  BUN 28* 24* 30* 26*  CREATININE 1.05* 0.96 0.87 0.86  CALCIUM 9.4 9.1 9.3 8.9  MG  --  2.1  --   --   PHOS  --  3.5  --   --    Liver Function Tests:  Recent Labs Lab 05/30/17 0320  AST 22  ALT 20  ALKPHOS 53  BILITOT 0.2*  PROT 6.3*  ALBUMIN 3.4*   Cardiac Enzymes:  Recent Labs Lab 05/29/17 1631 05/29/17 2157 05/30/17 0320 05/30/17 1020  TROPONINI <0.03 <0.03 <0.03 <0.03   HbA1C: No results for input(s): HGBA1C in the last 72 hours. CBG:  Recent Labs Lab 06/01/17 1131 06/01/17 1621 06/01/17 2047 06/02/17 0734 06/02/17 1137   GLUCAP 152* 118* 186* 128* 118*   Urine analysis:    Component Value Date/Time   COLORURINE YELLOW 07/18/2016 2305   APPEARANCEUR CLEAR 07/18/2016 2305   LABSPEC 1.021 07/18/2016 2305   PHURINE 6.0 07/18/2016 2305   GLUCOSEU NEGATIVE 07/18/2016 2305   HGBUR NEGATIVE 07/18/2016 2305   BILIRUBINUR NEGATIVE 07/18/2016 2305   KETONESUR NEGATIVE 07/18/2016 2305   PROTEINUR NEGATIVE 07/18/2016 2305   UROBILINOGEN 0.2 01/18/2014 2313   NITRITE NEGATIVE 07/18/2016 2305   LEUKOCYTESUR SMALL (A) 07/18/2016 2305   Recent Results (from the past 240 hour(s))  Respiratory Panel by PCR     Status: None   Collection Time: 05/29/17 10:56 PM  Result Value Ref Range Status   Adenovirus NOT DETECTED NOT DETECTED Final   Coronavirus 229E NOT DETECTED NOT DETECTED Final   Coronavirus HKU1 NOT DETECTED NOT DETECTED Final   Coronavirus NL63 NOT DETECTED NOT DETECTED Final   Coronavirus OC43 NOT DETECTED NOT DETECTED Final   Metapneumovirus NOT DETECTED NOT DETECTED Final   Rhinovirus / Enterovirus NOT DETECTED NOT DETECTED Final   Influenza A NOT DETECTED NOT DETECTED Final   Influenza B NOT DETECTED NOT DETECTED Final   Parainfluenza Virus 1 NOT DETECTED NOT DETECTED Final   Parainfluenza Virus 2 NOT DETECTED NOT DETECTED Final   Parainfluenza Virus 3 NOT DETECTED NOT DETECTED Final   Parainfluenza Virus 4 NOT DETECTED NOT DETECTED Final   Respiratory Syncytial Virus NOT DETECTED NOT DETECTED Final   Bordetella pertussis NOT DETECTED NOT DETECTED Final   Chlamydophila pneumoniae NOT DETECTED NOT DETECTED Final   Mycoplasma pneumoniae NOT DETECTED NOT DETECTED Final    Comment: Performed at [facility], [place] [address], [place], [place] [address]     Radiology Studies: Ct Chest Wo Contrast  Result Date: 06/02/2017 CLINICAL DATA:  Asthma exacerbation, chronic systolic CHF EXAM: CT CHEST WITHOUT CONTRAST TECHNIQUE: Multidetector CT imaging of the chest was performed following the standard  protocol without IV contrast. COMPARISON:  Chest radiographs dated 05/29/2017 FINDINGS: Cardiovascular: Heart is normal in size.  No pericardial effusion. No evidence of thoracic aortic aneurysm. Atherosclerotic calcifications of the aortic arch. Three vessel coronary atherosclerosis. Mediastinum/Nodes: Small mediastinal lymph nodes which do not meet pathologic CT size criteria. Visualized right thyroid is enlarged/ nodular. Lungs/Pleura: Mild linear scarring in the right upper, middle, and lower lobes. No focal consolidation. 4 mm subpleural nodule in the right lower lobe along the fissure (series 5/ image 70), likely with benign subpleural  lymph node. 2 mm subpleural nodule in the left lower lobe (series 5/image 37). 3 mm subpleural left lower lobe pulmonary nodule (series 3 5/image 102). Additional small bilateral pulmonary nodules. Mild centrilobular emphysematous changes, upper lobe predominant. No pleural effusion or pneumothorax. Upper Abdomen: Visualized upper abdomen is notable for a tiny hiatal hernia and a 14 mm short axis partially calcified left celiac axis node (series 2/ image 130), grossly unchanged from prior CT abdomen/ pelvis dated 11/02/2016. Musculoskeletal: Mild degenerative changes the thoracic spine. IMPRESSION: Small bilateral pulmonary nodules measuring up to 4 mm in the right lower lobe. Non-contrast chest CT can be considered in 12 months in this high-risk patient. This recommendation follows the consensus statement: Guidelines for Management of Incidental Pulmonary Nodules Detected on CT Images: From the Fleischner Society 2017; Radiology 2017; 284:228-243. No evidence of acute cardiopulmonary disease. Aortic Atherosclerosis (ICD10-I70.0) and Emphysema (ICD10-J43.9). Electronically Signed   By: Julian Hy M.D.   On: 06/02/2017 11:58    Scheduled Meds: . aspirin EC  81 mg Oral QHS  . benzonatate  200 mg Oral BID  . clopidogrel  75 mg Oral QPM  . doxycycline  100 mg Oral Q12H   . enoxaparin (LOVENOX) injection  30 mg Subcutaneous QHS  . furosemide  20 mg Oral Daily  . insulin aspart  0-15 Units Subcutaneous TID WC  . insulin aspart  0-5 Units Subcutaneous QHS  . ipratropium-albuterol  3 mL Nebulization TID  . irbesartan  150 mg Oral Daily  . metoCLOPramide  5 mg Oral TID AC  . metoprolol succinate  100 mg Oral Daily  . metoprolol succinate  50 mg Oral QHS  . pantoprazole  40 mg Oral BID  . predniSONE  40 mg Oral Q supper  . QUEtiapine  25 mg Oral QHS  . rosuvastatin  20 mg Oral Daily  . senna  1 tablet Oral BID  . sodium chloride flush  3 mL Intravenous Q12H  . sucralfate  1 g Oral TID WC & HS   Continuous Infusions: . sodium chloride       LOS: 4 days   Time spent: 25 minutes.  Faye Ramsay, MD [facility] Pager [phone]  If 7PM-7AM, please contact night-coverage [email or web address] Password [email or web address] 06/02/2017, 1:54 PM

## 2017-06-03 LAB — GLUCOSE, CAPILLARY
GLUCOSE-CAPILLARY: 96 mg/dL (ref 65–99)
GLUCOSE-CAPILLARY: 131 mg/dL — AB (ref 65–99)
Glucose-Capillary: 143 mg/dL — ABNORMAL HIGH (ref 65–99)
Glucose-Capillary: 132 mg/dL — ABNORMAL HIGH (ref 65–99)

## 2017-06-03 MED ORDER — IPRATROPIUM-ALBUTEROL 0.5-2.5 (3) MG/3ML IN SOLN
3.0000 mL | Freq: Two times a day (BID) | RESPIRATORY_TRACT | Status: DC
  Administered 2017-06-04 – 2017-06-06 (×5): 3 mL via RESPIRATORY_TRACT
  Filled 2017-06-03 (×5): qty 3

## 2017-06-03 NOTE — Care Management Note (Signed)
Case Management Note  Patient Details  Name: [name] MRN: [id number] Date of Birth: 1925/04/02  Subjective/Objective:                  asthma  Action/Plan: Date:  June 03, 2017 Chart reviewed for concurrent status and case management needs.  Will continue to follow patient progress.  Discharge Planning: following for needs  Expected discharge date: June 06, 2017  [name], BSN, [place], [place]   Expected Discharge Date:   (unknown)               Expected Discharge Plan:  Home/Self Care  In-House Referral:     Discharge planning Services  CM Consult  Post Acute Care Choice:    Choice offered to:     DME Arranged:    DME Agency:     HH Arranged:    HH Agency:     Status of Service:  In process, will continue to follow  If discussed at Long Length of Stay Meetings, dates discussed:    Additional Comments:  Leeroy Cha, RN 06/03/2017, 9:30 AM

## 2017-06-03 NOTE — Progress Notes (Signed)
Occupational Therapy Treatment Patient Details Name: [name] MRN: [id number] DOB: Sep 26, 1924 Today's Date: 06/03/2017    History of present illness 83 y.o. female with medical history significant of Asthma, R TKA, exploratory laparotomy 2* SBO 01/2016, CAD, DM2, bronchitis, GERD, HTN. Dx asthma exacerbation   OT comments  Pt overall S with ADL activity this am- pt making progress  Follow Up Recommendations  Home health OT;Supervision/Assistance - 24 hour    Equipment Recommendations  None recommended by OT    Recommendations for Other Services      Precautions / Restrictions Precautions Precautions: Fall Restrictions Weight Bearing Restrictions: No       Mobility Bed Mobility               General bed mobility comments: pt OOB  Transfers Overall transfer level: Needs assistance Equipment used: Rolling walker (2 wheeled) Transfers: Sit to/from [facility] Sit to Stand: Supervision Stand pivot transfers: Supervision                ADL either performed or assessed with clinical judgement   ADL       Grooming: Standing;Brushing hair;Wash/dry face;Wash/dry hands;Cueing for safety       Lower Body Bathing: Minimal assistance;Cueing for safety;Cueing for sequencing       Lower Body Dressing: Sit to/from stand;Cueing for safety;Cueing for sequencing;Minimal assistance   Toilet Transfer: Supervision/safety;Ambulation;RW;Cueing for sequencing   Toileting- Clothing Manipulation and Hygiene: Supervision/safety;Sit to/from stand;Cueing for safety;Cueing for sequencing         General ADL Comments: family will A as needed     Vision Patient Visual Report: No change from baseline            Cognition Arousal/Alertness: Awake/alert Behavior During Therapy: WFL for tasks assessed/performed Overall Cognitive Status: Within Functional Limits for tasks assessed                                                    Pertinent Vitals/ Pain       Pain Assessment: No/denies pain                                                Frequency  Min 2X/week        Progress Toward Goals  OT Goals(current goals can now be found in the care plan section)  Progress towards OT goals: Progressing toward goals     Plan Discharge plan remains appropriate    Co-evaluation                 AM-PAC PT "6 Clicks" Daily Activity     Outcome Measure   Help from another person eating meals?: None Help from another person taking care of personal grooming?: A Little Help from another person toileting, which includes using toliet, bedpan, or urinal?: A Little Help from another person bathing (including washing, rinsing, drying)?: A Little Help from another person to put on and taking off regular upper body clothing?: A Little Help from another person to put on and taking off regular lower body clothing?: A Little 6 Click Score: 19    End of Session Equipment Utilized During Treatment: Rolling walker  OT Visit Diagnosis: Unsteadiness on feet (  R26.81);Muscle weakness (generalized) (M62.81)   Activity Tolerance Patient tolerated treatment well   Patient Left in chair;with chair alarm set   Nurse Communication Mobility status        Time: 3225-6720 OT Time Calculation (min): 22 min  Charges: OT General Charges $OT Visit: 1 Visit OT Treatments $Self Care/Home Management : 8-22 mins  [place], [place]   Betsy Pries 06/03/2017, 12:42 PM

## 2017-06-03 NOTE — Progress Notes (Signed)
PROGRESS NOTE  [name]  AST:[id number] DOB: [birth year] DOA: 05/29/2017  PCP: [name], MD   Outpatient Specialists: Pulmonology Sullivan Lone Buccini, Cardiology Claiborne Billings   Brief Narrative: 83 y.o. female with a history of asthma, CAD, T2DM, GERD, HTN who presented to the ED 10/10 with dyspnea, wheezing and cough worsening over 4 days. Symptoms typical of prior asthma exacerbations, so she tried nebulizers at home with some, but limited, benefit so she called EMS. On arrival she had increased work of breathing despite nebulizers and steroids and was admitted.  Assessment & Plan:  Asthma exacerbation: Unclear precipitant. Had flu vaccine 6 days PTA, though no true risk factors/fevers/sick contacts. RVP negative.  - pt clinically improving but still with intermittent episodes of non productive and productive cough  - cont BD scheduled and as needed - prednisone taper, continue doxy for now   Constipation: Mod stool burden on KUB.  - has had BM  CAD and chronic systolic CHF: No chest pain, ECG nonischemic, troponins neg. D-dimer within normal limits for age.  - Continue ASA/plavix, statin. - Continue ARB, lasix 20mg  daily.  - restarted Metoprolol 10/11  T2DM: LGX2J 1.9% - Hold trulicity - keep on SSI for now   Hyperlipidemia: Stable - continue statin   HTN: Chronic, stable - stable BP - may need to hold ARB if pt not better   GERD: Chronic, stable - Continue home med  Insomnia - Continue seroquel qHS - pt denies concerns regarding insomnia   Low TSH - fT4 pending   DVT prophylaxis: Lovenox SQ Code Status: Partial (NIPPV/intubation only if respiratory compromise), if cardiac/respiratory arrest does not want resuscitation Family Communication: son at bedside  Disposition Plan: home in 1-2 days   Consultants:   None  Procedures:   None  Antimicrobials:  Doxycycline 10/13 -->  Subjective: Pt reports ongoing cough.   Objective: Vitals:   06/02/17 1930  06/02/17 2144 06/03/17 0458 06/03/17 0806  BP:  125/88 (!) 156/87   Pulse:  (!) 101 94   Resp:  18 18   Temp:  98.3 F (36.8 C) (!) 97.5 F (36.4 C)   TempSrc:  Oral Oral   SpO2: 97% 95% 96% 95%  Weight:      Height:        Intake/Output Summary (Last 24 hours) at 06/03/17 1238 Last data filed at 06/03/17 0900  Gross per 24 hour  Intake              720 ml  Output                0 ml  Net              720 ml   Filed Weights   05/30/17 1011 05/30/17 1709  Weight: 86.4 kg (190 lb 8 oz) 86.4 kg (190 lb 7.6 oz)    Physical Exam  Constitutional: Appears calm, NAD CVS: RRR, S1/S2 +, no murmurs, no gallops, no carotid bruit.  Pulmonary: Effort and breath sounds normal, mild rhonchi at bases   Abdominal: Soft. BS +,  no distension, tenderness, rebound or guarding.  Musculoskeletal: Normal range of motion. No edema and no tenderness.    Data Reviewed: I have personally reviewed following labs and imaging studies  CBC:  Recent Labs Lab 05/29/17 1631 05/30/17 0320 06/01/17 0531 06/02/17 0610  WBC 5.1 5.6 8.8 6.5  NEUTROABS 2.8  --   --   --   HGB 12.1 11.7* 12.7 12.7  HCT 38.5  37.4 41.3 40.0  MCV 86.1 86.0 85.9 85.1  PLT 149* 170 208 536   Basic Metabolic Panel:  Recent Labs Lab 05/29/17 1631 05/30/17 0320 06/01/17 0531 06/02/17 0610  NA 141 141 142 143  K 4.1 4.2 4.1 3.8  CL 106 107 107 107  CO2 27 25 25 27   GLUCOSE 136* 182* 174* 142*  BUN 28* 24* 30* 26*  CREATININE 1.05* 0.96 0.87 0.86  CALCIUM 9.4 9.1 9.3 8.9  MG  --  2.1  --   --   PHOS  --  3.5  --   --    Liver Function Tests:  Recent Labs Lab 05/30/17 0320  AST 22  ALT 20  ALKPHOS 53  BILITOT 0.2*  PROT 6.3*  ALBUMIN 3.4*   Cardiac Enzymes:  Recent Labs Lab 05/29/17 1631 05/29/17 2157 05/30/17 0320 05/30/17 1020  TROPONINI <0.03 <0.03 <0.03 <0.03   CBG:  Recent Labs Lab 06/02/17 1137 06/02/17 1620 06/02/17 2149 06/03/17 0738 06/03/17 1219  GLUCAP 118* 123* 205* 143* 96     Urine analysis:    Component Value Date/Time   COLORURINE YELLOW 07/18/2016 [place] 07/18/2016 2305   LABSPEC 1.021 07/18/2016 2305   PHURINE 6.0 07/18/2016 2305   GLUCOSEU NEGATIVE 07/18/2016 2305   HGBUR NEGATIVE 07/18/2016 2305   BILIRUBINUR NEGATIVE 07/18/2016 2305   KETONESUR NEGATIVE 07/18/2016 2305   PROTEINUR NEGATIVE 07/18/2016 2305   UROBILINOGEN 0.2 01/18/2014 2313   NITRITE NEGATIVE 07/18/2016 2305   LEUKOCYTESUR SMALL (A) 07/18/2016 2305   Recent Results (from the past 240 hour(s))  Respiratory Panel by PCR     Status: None   Collection Time: 05/29/17 10:56 PM  Result Value Ref Range Status   Adenovirus NOT DETECTED NOT DETECTED Final   Coronavirus 229E NOT DETECTED NOT DETECTED Final   Coronavirus HKU1 NOT DETECTED NOT DETECTED Final   Coronavirus NL63 NOT DETECTED NOT DETECTED Final   Coronavirus OC43 NOT DETECTED NOT DETECTED Final   Metapneumovirus NOT DETECTED NOT DETECTED Final   Rhinovirus / Enterovirus NOT DETECTED NOT DETECTED Final   Influenza A NOT DETECTED NOT DETECTED Final   Influenza B NOT DETECTED NOT DETECTED Final   Parainfluenza Virus 1 NOT DETECTED NOT DETECTED Final   Parainfluenza Virus 2 NOT DETECTED NOT DETECTED Final   Parainfluenza Virus 3 NOT DETECTED NOT DETECTED Final   Parainfluenza Virus 4 NOT DETECTED NOT DETECTED Final   Respiratory Syncytial Virus NOT DETECTED NOT DETECTED Final   Bordetella pertussis NOT DETECTED NOT DETECTED Final   Chlamydophila pneumoniae NOT DETECTED NOT DETECTED Final   Mycoplasma pneumoniae NOT DETECTED NOT DETECTED Final    Comment: Performed at [facility], [place]. [address], [place], [place] [address]     Radiology Studies: Ct Chest Wo Contrast  Result Date: 06/02/2017 CLINICAL DATA:  Asthma exacerbation, chronic systolic CHF EXAM: CT CHEST WITHOUT CONTRAST TECHNIQUE: Multidetector CT imaging of the chest was performed following the standard protocol without IV contrast.  COMPARISON:  Chest radiographs dated 05/29/2017 FINDINGS: Cardiovascular: Heart is normal in size.  No pericardial effusion. No evidence of thoracic aortic aneurysm. Atherosclerotic calcifications of the aortic arch. Three vessel coronary atherosclerosis. Mediastinum/Nodes: Small mediastinal lymph nodes which do not meet pathologic CT size criteria. Visualized right thyroid is enlarged/ nodular. Lungs/Pleura: Mild linear scarring in the right upper, middle, and lower lobes. No focal consolidation. 4 mm subpleural nodule in the right lower lobe along the fissure (series 5/ image 70), likely with  benign subpleural lymph node. 2 mm subpleural nodule in the left lower lobe (series 5/image 37). 3 mm subpleural left lower lobe pulmonary nodule (series 3 5/image 102). Additional small bilateral pulmonary nodules. Mild centrilobular emphysematous changes, upper lobe predominant. No pleural effusion or pneumothorax. Upper Abdomen: Visualized upper abdomen is notable for a tiny hiatal hernia and a 14 mm short axis partially calcified left celiac axis node (series 2/ image 130), grossly unchanged from prior CT abdomen/ pelvis dated 11/02/2016. Musculoskeletal: Mild degenerative changes the thoracic spine. IMPRESSION: Small bilateral pulmonary nodules measuring up to 4 mm in the right lower lobe. Non-contrast chest CT can be considered in 12 months in this high-risk patient. This recommendation follows the consensus statement: Guidelines for Management of Incidental Pulmonary Nodules Detected on CT Images: From the Fleischner Society 2017; Radiology 2017; 284:228-243. No evidence of acute cardiopulmonary disease. Aortic Atherosclerosis (ICD10-I70.0) and Emphysema (ICD10-J43.9). Electronically Signed   By: Julian Hy M.D.   On: 06/02/2017 11:58    Scheduled Meds: . aspirin EC  81 mg Oral QHS  . benzonatate  200 mg Oral BID  . clopidogrel  75 mg Oral QPM  . doxycycline  100 mg Oral Q12H  . enoxaparin (LOVENOX)  injection  30 mg Subcutaneous QHS  . furosemide  20 mg Oral Daily  . insulin aspart  0-15 Units Subcutaneous TID WC  . insulin aspart  0-5 Units Subcutaneous QHS  . ipratropium-albuterol  3 mL Nebulization TID  . irbesartan  150 mg Oral Daily  . metoCLOPramide  5 mg Oral TID AC  . metoprolol succinate  100 mg Oral Daily  . metoprolol succinate  50 mg Oral QHS  . pantoprazole  40 mg Oral BID  . QUEtiapine  25 mg Oral QHS  . rosuvastatin  20 mg Oral Daily  . senna  1 tablet Oral BID  . sodium chloride flush  3 mL Intravenous Q12H  . sucralfate  1 g Oral TID WC & HS   Continuous Infusions: . sodium chloride       LOS: 5 days   Time spent: 25 minutes.  Faye Ramsay, MD [facility] Pager [phone]  If 7PM-7AM, please contact night-coverage [email or web address] Password [facility] 06/03/2017, 12:38 PM

## 2017-06-04 LAB — GLUCOSE, CAPILLARY
GLUCOSE-CAPILLARY: 124 mg/dL — AB (ref 65–99)
GLUCOSE-CAPILLARY: 112 mg/dL — AB (ref 65–99)
Glucose-Capillary: 136 mg/dL — ABNORMAL HIGH (ref 65–99)
Glucose-Capillary: 150 mg/dL — ABNORMAL HIGH (ref 65–99)

## 2017-06-04 MED ORDER — GI COCKTAIL ~~LOC~~: 30.0000 mL | Freq: Two times a day (BID) | ORAL | Status: DC | PRN

## 2017-06-04 NOTE — Progress Notes (Signed)
Physical Therapy Treatment Patient Details Name: [name] MRN: [id number] DOB: [birth year] Today's Date: 06/04/2017    History of Present Illness 83 y.o. female with medical history significant of Asthma, R TKA, exploratory laparotomy 2* SBO 01/2016, CAD, DM2, bronchitis, GERD, HTN. Dx asthma exacerbation    PT Comments    Pt is progressing well; reports feeling better today overall; continue to recommend HHPT and initial supervision for OOB   Follow Up Recommendations  Home health PT;Supervision for mobility/OOB     Equipment Recommendations  None recommended by PT    Recommendations for Other Services       Precautions / Restrictions Precautions Precautions: Fall Restrictions Weight Bearing Restrictions: No    Mobility  Bed Mobility Overal bed mobility: Needs Assistance Bed Mobility: Supine to Sit     Supine to sit: Supervision;HOB elevated     General bed mobility comments: light use of rail, HOB elevated ~30*  Transfers Overall transfer level: Needs assistance Equipment used: Rolling walker (2 wheeled) Transfers: Sit to/from Stand Sit to Stand: Supervision         General transfer comment: subtle cues for hand placement, pt demo's good carryover from previous session  Ambulation/Gait Ambulation/Gait assistance: Min guard Ambulation Distance (Feet): 15 Feet (x2) Assistive device: Rolling walker (2 wheeled) Gait Pattern/deviations: Step-through pattern;Decreased stride length Gait velocity: decr   General Gait Details: slow but steady gait, pt demo's good carryover from previous session with improved RW safety awareness when maneuvering in tighter spaces   Stairs            Wheelchair Mobility    Modified Rankin (Stroke Patients Only)       Balance Overall balance assessment: Needs assistance Sitting-balance support: No upper extremity supported;Feet supported Sitting balance-Leahy Scale: Good     Standing balance support: No upper  extremity supported;During functional activity Standing balance-Leahy Scale: Fair Standing balance comment: able to stand and wash hands at sink without UE support, close guarding but no LOB; able to reach ~4" outside BOS                            Cognition Arousal/Alertness: Awake/alert Behavior During Therapy: WFL for tasks assessed/performed Overall Cognitive Status: Within Functional Limits for tasks assessed                                        Exercises      General Comments        Pertinent Vitals/Pain Pain Assessment: No/denies pain    Home Living                      Prior Function            PT Goals (current goals can now be found in the care plan section) Acute Rehab PT Goals Patient Stated Goal: pt wants to go home and not to a nursing home. Would be open to HHPT to help her get a little stronger. "I am weak and have been for a while".  PT Goal Formulation: With patient Time For Goal Achievement: 06/07/17 Potential to Achieve Goals: Good Progress towards PT goals: Progressing toward goals    Frequency    Min 3X/week      PT Plan Current plan remains appropriate    Co-evaluation  AM-PAC PT "6 Clicks" Daily Activity  Outcome Measure  Difficulty turning over in bed (including adjusting bedclothes, sheets and blankets)?: A Little Difficulty moving from lying on back to sitting on the side of the bed? : A Little Difficulty sitting down on and standing up from a chair with arms (e.g., wheelchair, bedside commode, etc,.)?: A Little Help needed moving to and from a bed to chair (including a wheelchair)?: A Little Help needed walking in hospital room?: A Little Help needed climbing 3-5 steps with a railing? : A Lot 6 Click Score: 17    End of Session Equipment Utilized During Treatment: Gait belt Activity Tolerance: Patient tolerated treatment well Patient left: in chair;with call bell/phone  within reach;with chair alarm set   PT Visit Diagnosis: Muscle weakness (generalized) (M62.81)     Time: 5320-2334 PT Time Calculation (min) (ACUTE ONLY): 23 min  Charges:  $Gait Training: 23-37 mins                    G CodesKenyon Ana, PT Pager: [phone] 06/04/2017    Kenyon Ana 06/04/2017, 11:34 AM

## 2017-06-04 NOTE — Progress Notes (Addendum)
PROGRESS NOTE  [name]  MEQ:[id number] DOB: [birth year] DOA: 05/29/2017  PCP: [name], MD   Outpatient Specialists: Pulmonology [facility] Lone Buccini, Cardiology Claiborne Billings   Brief Narrative: 83 y.o. female with a history of asthma, CAD, T2DM, GERD, HTN who presented to the ED 10/10 with dyspnea, wheezing and cough worsening over 4 days. Symptoms typical of prior asthma exacerbations, so she tried nebulizers at home with some, but limited, benefit so she called EMS. On arrival she had increased work of breathing despite nebulizers and steroids and was admitted.  Assessment & Plan:  Asthma exacerbation: Unclear precipitant. Had flu vaccine 6 days PTA - pt clinically improving but still with intermittent episodes of non productive and productive cough  - says she has a feeling that something is stuck in her throat  - cont BD scheduled and as needed - ensure control of acid reflux as well  - prednisone taper completed inpatient, continue doxy for now - if better, can be discharged in AM   Constipation: Mod stool burden on KUB.  - has had BM  CAD and chronic systolic CHF: No chest pain, ECG nonischemic, troponins neg. D-dimer within normal limits for age.  - Continue ASA/plavix, statin. - Continue ARB, lasix 20mg  daily.  - restarted Metoprolol 10/11  T2DM: WLN9G 9.2% - Hold trulicity - cont SSI   Hyperlipidemia: Stable - continue statin   HTN: Chronic, stable - stable BP - may need to hold ARB if pt not better   GERD: Chronic, stable - Continue home med  Insomnia - Continue seroquel qHS - pt denies concerns regarding insomnia   Low TSH - fT4 pending   DVT prophylaxis: Lovenox SQ Code Status: Partial (NIPPV/intubation only if respiratory compromise), if cardiac/respiratory arrest does not want resuscitation Family Communication: son at bedside  Disposition Plan: home in am if resp stable   Consultants:   None  Procedures:   None  Antimicrobials:  Doxycycline  10/13 --> 10/17  Subjective: Pt still concerned with ongoing cough.   Objective: Vitals:   06/03/17 2216 06/04/17 0421 06/04/17 0916 06/04/17 1317  BP: (!) 120/58 (!) 156/73  (!) 144/80  Pulse: 86 95  (!) 108  Resp: 16 16  16   Temp: 97.8 F (36.6 C) 98.2 F (36.8 C)  99 F (37.2 C)  TempSrc: Oral Oral  Oral  SpO2: 98% 97% 95% 96%  Weight:      Height:        Intake/Output Summary (Last 24 hours) at 06/04/17 1403 Last data filed at 06/04/17 0908  Gross per 24 hour  Intake              723 ml  Output                0 ml  Net              723 ml   Filed Weights   05/30/17 1011 05/30/17 1709  Weight: 86.4 kg (190 lb 8 oz) 86.4 kg (190 lb 7.6 oz)    Physical Exam  Constitutional: Appears calm, NAD CVS: RRR, S1/S2 +, no murmurs, no gallops, no carotid bruit.  Pulmonary: Effort and breath sounds normal, still some exp wheezing noted  Musculoskeletal: Normal range of motion. No edema and no tenderness.   Data Reviewed: I have personally reviewed following labs and imaging studies  CBC:  Recent Labs Lab 05/29/17 1631 05/30/17 0320 06/01/17 0531 06/02/17 0610  WBC 5.1 5.6 8.8 6.5  NEUTROABS 2.8  --   --   --  HGB 12.1 11.7* 12.7 12.7  HCT 38.5 37.4 41.3 40.0  MCV 86.1 86.0 85.9 85.1  PLT 149* 170 208 741   Basic Metabolic Panel:  Recent Labs Lab 05/29/17 1631 05/30/17 0320 06/01/17 0531 06/02/17 0610  NA 141 141 142 143  K 4.1 4.2 4.1 3.8  CL 106 107 107 107  CO2 27 25 25 27   GLUCOSE 136* 182* 174* 142*  BUN 28* 24* 30* 26*  CREATININE 1.05* 0.96 0.87 0.86  CALCIUM 9.4 9.1 9.3 8.9  MG  --  2.1  --   --   PHOS  --  3.5  --   --    Liver Function Tests:  Recent Labs Lab 05/30/17 0320  AST 22  ALT 20  ALKPHOS 53  BILITOT 0.2*  PROT 6.3*  ALBUMIN 3.4*   Cardiac Enzymes:  Recent Labs Lab 05/29/17 1631 05/29/17 2157 05/30/17 0320 05/30/17 1020  TROPONINI <0.03 <0.03 <0.03 <0.03   CBG:  Recent Labs Lab 06/03/17 1219 06/03/17 1652  06/03/17 2212 06/04/17 0729 06/04/17 1119  GLUCAP 96 131* 132* 124* 112*   Urine analysis:    Component Value Date/Time   COLORURINE YELLOW 07/18/2016 [place] 07/18/2016 2305   LABSPEC 1.021 07/18/2016 2305   PHURINE 6.0 07/18/2016 2305   GLUCOSEU NEGATIVE 07/18/2016 2305   HGBUR NEGATIVE 07/18/2016 2305   BILIRUBINUR NEGATIVE 07/18/2016 2305   KETONESUR NEGATIVE 07/18/2016 2305   PROTEINUR NEGATIVE 07/18/2016 2305   UROBILINOGEN 0.2 01/18/2014 2313   NITRITE NEGATIVE 07/18/2016 2305   LEUKOCYTESUR SMALL (A) 07/18/2016 2305   Recent Results (from the past 240 hour(s))  Respiratory Panel by PCR     Status: None   Collection Time: 05/29/17 10:56 PM  Result Value Ref Range Status   Adenovirus NOT DETECTED NOT DETECTED Final   Coronavirus 229E NOT DETECTED NOT DETECTED Final   Coronavirus HKU1 NOT DETECTED NOT DETECTED Final   Coronavirus NL63 NOT DETECTED NOT DETECTED Final   Coronavirus OC43 NOT DETECTED NOT DETECTED Final   Metapneumovirus NOT DETECTED NOT DETECTED Final   Rhinovirus / Enterovirus NOT DETECTED NOT DETECTED Final   Influenza A NOT DETECTED NOT DETECTED Final   Influenza B NOT DETECTED NOT DETECTED Final   Parainfluenza Virus 1 NOT DETECTED NOT DETECTED Final   Parainfluenza Virus 2 NOT DETECTED NOT DETECTED Final   Parainfluenza Virus 3 NOT DETECTED NOT DETECTED Final   Parainfluenza Virus 4 NOT DETECTED NOT DETECTED Final   Respiratory Syncytial Virus NOT DETECTED NOT DETECTED Final   Bordetella pertussis NOT DETECTED NOT DETECTED Final   Chlamydophila pneumoniae NOT DETECTED NOT DETECTED Final   Mycoplasma pneumoniae NOT DETECTED NOT DETECTED Final    Comment: Performed at [facility], [place]. [address] [place], [place] [address]     Radiology Studies: No results found.  Scheduled Meds: . aspirin EC  81 mg Oral QHS  . benzonatate  200 mg Oral BID  . clopidogrel  75 mg Oral QPM  . doxycycline  100 mg Oral Q12H  . enoxaparin  (LOVENOX) injection  30 mg Subcutaneous QHS  . furosemide  20 mg Oral Daily  . insulin aspart  0-15 Units Subcutaneous TID WC  . insulin aspart  0-5 Units Subcutaneous QHS  . ipratropium-albuterol  3 mL Nebulization BID  . irbesartan  150 mg Oral Daily  . metoCLOPramide  5 mg Oral TID AC  . metoprolol succinate  100 mg Oral Daily  . metoprolol succinate  50  mg Oral QHS  . pantoprazole  40 mg Oral BID  . QUEtiapine  25 mg Oral QHS  . rosuvastatin  20 mg Oral Daily  . senna  1 tablet Oral BID  . sodium chloride flush  3 mL Intravenous Q12H  . sucralfate  1 g Oral TID WC & HS   Continuous Infusions: . sodium chloride       LOS: 6 days   Time spent: 15 minutes.  Faye Ramsay, MD [facility] Pager [phone]  If 7PM-7AM, please contact night-coverage [email or web address] Password [email or web address] 06/04/2017, 2:03 PM

## 2017-06-04 NOTE — Care Management Important Message (Signed)
Important Message  Patient Details  Name: ANNI HOCEVAR MRN: [id number] Date of Birth: 1924-10-03   Medicare Important Message Given:  Yes    Kerin Salen 06/04/2017, 10:15 AMImportant Message  Patient Details  Name: KORRINE SICARD MRN: [id number] Date of Birth: 1925/02/04   Medicare Important Message Given:  Yes    Kerin Salen 06/04/2017, 10:15 AMImportant Message  Patient Details  Name: MAELIN KURKOWSKI MRN: [id number] Date of Birth: [birth year]   Medicare Important Message Given:  Yes    Kerin Salen 06/04/2017, 10:14 AM

## 2017-06-05 LAB — GLUCOSE, CAPILLARY
GLUCOSE-CAPILLARY: 133 mg/dL — AB (ref 65–99)
GLUCOSE-CAPILLARY: 116 mg/dL — AB (ref 65–99)
GLUCOSE-CAPILLARY: 156 mg/dL — AB (ref 65–99)
Glucose-Capillary: 123 mg/dL — ABNORMAL HIGH (ref 65–99)

## 2017-06-05 MED ORDER — GI COCKTAIL ~~LOC~~
30.0000 mL | Freq: Three times a day (TID) | ORAL | Status: DC
  Administered 2017-06-05 – 2017-06-06 (×4): 30 mL via ORAL
  Filled 2017-06-05 (×4): qty 30

## 2017-06-05 NOTE — Discharge Summary (Signed)
Physician Discharge Summary  [name] GUR:[id number] DOB: 03-21-1925 DOA: 05/29/2017  PCP: [name], MD  Admit date: 05/29/2017 Discharge date: 06/05/2017  Recommendations for Outpatient Follow-up:  1. Pt will need to follow up with PCP in 2-3 weeks post discharge 2. Please obtain BMP to evaluate electrolytes and kidney function 3. Please also check CBC to evaluate Hg and Hct levels  Discharge Diagnoses:  Active Problems:   Coronary atherosclerosis   HTN (hypertension)   HLD (hyperlipidemia)   Asthma exacerbation   GERD (gastroesophageal reflux disease)   Diabetes mellitus type 2 without retinopathy (HCC)   Acute on chronic respiratory failure ([place])   Dyspnea  Discharge Condition: Stable  Diet recommendation: Heart healthy diet discussed in details   History of present illness:  83 y.o. female with a history of asthma, CAD, T2DM, GERD, HTN who presented to the ED 10/10 with dyspnea, wheezing and cough worsening over 4 days. Symptoms typical of prior asthma exacerbations, so she tried nebulizers at home with some, but limited, benefit so she called EMS. On arrival she had increased work of breathing despite nebulizers and steroids and was admitted.  Assessment & Plan:  Asthma exacerbation: Unclear precipitant. Had flu vaccine 6 days PTA - pt clinically improving but still with intermittent episodes of non productive and productive cough  - says she has a feeling that something is stuck in her throat  - SLP done, continue with regular diet  - cont BD as needed - ensure control of acid reflux as well  - prednisone taper completed inpatient, continue doxy, last dose today   Constipation: Mod stool burden on KUB.  - has had BM x 2 this AM   CAD and chronic systolic CHF: No chest pain, ECG nonischemic, troponins neg. D-dimer within normal limits for age.  - Continue ASA/plavix, statin. - Continue ARB, lasix 20mg  daily.  - restarted Metoprolol 10/11  T2DM:  HbA1c 6.6% - resume home medical regimen   Hyperlipidemia: Stable - continue statin   HTN: Chronic, stable - stable BP - may need to hold ARB if pt not better   GERD: Chronic, stable - Continue home med  Insomnia - Continue seroquel qHS - pt denies concerns regarding insomnia   Low TSH - outpatient follow up   DVT prophylaxis: Lovenox SQ Code Status: Partial (NIPPV/intubation only if respiratory compromise), if cardiac/respiratory arrest does not want resuscitation Family Communication: son at bedside  Disposition Plan: home in am if resp stable   Consultants:   None  Procedures:   None  Antimicrobials:  Doxycycline 10/13 --> 10/17  Procedures/Studies: Dg Chest 2 View  Result Date: 05/29/2017 CLINICAL DATA:  83 year old female with a history of cough and wheeze EXAM: CHEST  2 VIEW COMPARISON:  01/24/2016 FINDINGS: Cardiomediastinal silhouette unchanged in size and contour. No evidence of central vascular congestion. No pneumothorax. No confluent airspace disease. No pleural effusion. Coarsened interstitial markings, similar to the comparison studies. No displaced fracture. IMPRESSION: Chronic lung changes without evidence of acute cardiopulmonary disease. Electronically Signed   By: Corrie Mckusick D.O.   On: 05/29/2017 18:13   Dg Abd 1 View  Result Date: 05/29/2017 CLINICAL DATA:  Patient for 1 week, history of obstruction EXAM: ABDOMEN - 1 VIEW COMPARISON:  CT abdomen and pelvis 11/02/2016 FINDINGS: Normal bowel gas pattern. Scattered stool throughout colon. No evidence of bowel obstruction or wall thickening. Bones demineralized. No urine tract calcification. IMPRESSION: Nonobstructive bowel gas pattern. Electronically Signed   By: Elta Guadeloupe  Thornton Papas M.D.   On: 05/29/2017 22:50   Ct Chest Wo Contrast  Result Date: 06/02/2017 CLINICAL DATA:  Asthma exacerbation, chronic systolic CHF EXAM: CT CHEST WITHOUT CONTRAST TECHNIQUE: Multidetector CT imaging of the chest  was performed following the standard protocol without IV contrast. COMPARISON:  Chest radiographs dated 05/29/2017 FINDINGS: Cardiovascular: Heart is normal in size.  No pericardial effusion. No evidence of thoracic aortic aneurysm. Atherosclerotic calcifications of the aortic arch. Three vessel coronary atherosclerosis. Mediastinum/Nodes: Small mediastinal lymph nodes which do not meet pathologic CT size criteria. Visualized right thyroid is enlarged/ nodular. Lungs/Pleura: Mild linear scarring in the right upper, middle, and lower lobes. No focal consolidation. 4 mm subpleural nodule in the right lower lobe along the fissure (series 5/ image 70), likely with benign subpleural lymph node. 2 mm subpleural nodule in the left lower lobe (series 5/image 37). 3 mm subpleural left lower lobe pulmonary nodule (series 3 5/image 102). Additional small bilateral pulmonary nodules. Mild centrilobular emphysematous changes, upper lobe predominant. No pleural effusion or pneumothorax. Upper Abdomen: Visualized upper abdomen is notable for a tiny hiatal hernia and a 14 mm short axis partially calcified left celiac axis node (series 2/ image 130), grossly unchanged from prior CT abdomen/ pelvis dated 11/02/2016. Musculoskeletal: Mild degenerative changes the thoracic spine. IMPRESSION: Small bilateral pulmonary nodules measuring up to 4 mm in the right lower lobe. Non-contrast chest CT can be considered in 12 months in this high-risk patient. This recommendation follows the consensus statement: Guidelines for Management of Incidental Pulmonary Nodules Detected on CT Images: From the Fleischner Society 2017; Radiology 2017; 284:228-243. No evidence of acute cardiopulmonary disease. Aortic Atherosclerosis (ICD10-I70.0) and Emphysema (ICD10-J43.9). Electronically Signed   By: Julian Hy M.D.   On: 06/02/2017 11:58    Discharge Exam: Vitals:   06/05/17 1443 06/05/17 1516  BP: 120/61 121/67  Pulse: 82 99  Resp: 20 20   Temp: 98.2 F (36.8 C) 97.6 F (36.4 C)  SpO2: 97% 94%   Vitals:   06/05/17 0638 06/05/17 0815 06/05/17 1443 06/05/17 1516  BP:  131/73 120/61 121/67  Pulse:  97 82 99  Resp:  17 20 20   Temp:  98.2 F (36.8 C) 98.2 F (36.8 C) 97.6 F (36.4 C)  TempSrc:  Oral Oral Oral  SpO2: 96% 97% 97% 94%  Weight:      Height:        General: Pt is alert, follows commands appropriately, not in acute distress Cardiovascular: Regular rate and rhythm, S1/S2 +, no murmurs, no rubs, no gallops Respiratory: Clear to auscultation bilaterally, no wheezing, no crackles, no rhonchi Abdominal: Soft, non tender, non distended, bowel sounds +, no guarding Extremities: no edema, no cyanosis, pulses palpable bilaterally DP and PT Neuro: Grossly nonfocal  Discharge Instructions   Allergies as of 06/05/2017   No Known Allergies     Medication List    STOP taking these medications   beclomethasone 40 MCG/ACT inhaler Commonly known as:  QVAR REDIHALER   levofloxacin 500 MG tablet Commonly known as:  LEVAQUIN   loperamide 2 MG tablet Commonly known as:  IMODIUM A-D     TAKE these medications   acetaminophen-codeine 300-30 MG tablet Commonly known as:  TYLENOL #3 Take 1 tablet by mouth every 4 (four) hours as needed for moderate pain.   albuterol 108 (90 Base) MCG/ACT inhaler Commonly known as:  PROVENTIL HFA;VENTOLIN HFA Inhale 2 puffs into the lungs every 6 (six) hours as needed for wheezing or shortness of breath.  albuterol (2.5 MG/3ML) 0.083% nebulizer solution Commonly known as:  PROVENTIL Take 3 mLs (2.5 mg total) by nebulization every 6 (six) hours as needed for wheezing.   ANORO ELLIPTA 62.5-25 MCG/INH Aepb Generic drug:  umeclidinium-vilanterol INHALE 1 PUFF BY MOUTH EVERY DAY   aspirin EC 81 MG tablet Take 81 mg by mouth at bedtime.   azelastine 0.1 % nasal spray Commonly known as:  ASTELIN Place 1 spray into the nose 2 (two) times daily. Use in each nostril as  directed   CLARITIN-D 24 HOUR PO Take 1 tablet by mouth daily.   clopidogrel 75 MG tablet Commonly known as:  PLAVIX Take 75 mg by mouth every evening.   clorazepate 15 MG tablet Commonly known as:  TRANXENE Take 15 mg by mouth at bedtime. anxiety   furosemide 20 MG tablet Commonly known as:  LASIX Take 1 tablet alternating with 2 tablets every other day   LORazepam 1 MG tablet Commonly known as:  ATIVAN Take 1 mg by mouth every 8 (eight) hours.   metoCLOPramide 5 MG tablet Commonly known as:  REGLAN TAKE 1 TABLET 3 TIMES A DAY BEFORE MEALS AS NEEDED FOR NAUSEA   metoprolol succinate 50 MG 24 hr tablet Commonly known as:  TOPROL-XL TAKE 2 TABLETS IN THE MORNING AND 1 TABLET IN THE EVENING   multivitamin with minerals Tabs tablet Take 1 tablet by mouth daily.   nitroGLYCERIN 0.4 MG SL tablet Commonly known as:  NITROSTAT Place 0.4 mg under the tongue every 5 (five) minutes as needed. For chest pain   pantoprazole 40 MG tablet Commonly known as:  PROTONIX Take 40 mg by mouth 2 (two) times daily.   QUEtiapine 25 MG tablet Commonly known as:  SEROQUEL Take 25 mg by mouth at bedtime.   QVAR 40 MCG/ACT inhaler Generic drug:  beclomethasone TAKE 2 PUFFS BY MOUTH TWICE A DAY   rosuvastatin 20 MG tablet Commonly known as:  CRESTOR Take 20 mg by mouth daily.   sucralfate 1 g tablet Commonly known as:  CARAFATE TAKE 1 TABLET EVERY MORNING & UP TO 4 TIMES A DAY ON AN EMPTY STOMACH AS NEEDED FOR NAUSEA   telmisartan 40 MG tablet Commonly known as:  MICARDIS Take 40 mg by mouth daily.   TRULICITY 2.99 ME/2.6ST Sopn Generic drug:  Dulaglutide INJECT 1 PEN SUBCUTANEOUSLY WEEKLY   Vitamin D (Ergocalciferol) 2000 units Caps Take 1 capsule by mouth daily.      Follow-up Information    [name], MD Follow up.   Specialty:  Internal Medicine Contact information: [address] [place] [address] [phone]        Deneise Lever, MD  Follow up.   Specialty:  Pulmonary Disease Contact information: [place] [address] [phone]            The results of significant diagnostics from this hospitalization (including imaging, microbiology, ancillary and laboratory) are listed below for reference.     Microbiology: Recent Results (from the past 240 hour(s))  Respiratory Panel by PCR     Status: None   Collection Time: 05/29/17 10:56 PM  Result Value Ref Range Status   Adenovirus NOT DETECTED NOT DETECTED Final   Coronavirus 229E NOT DETECTED NOT DETECTED Final   Coronavirus HKU1 NOT DETECTED NOT DETECTED Final   Coronavirus NL63 NOT DETECTED NOT DETECTED Final   Coronavirus OC43 NOT DETECTED NOT DETECTED Final   Metapneumovirus NOT DETECTED NOT DETECTED Final  Rhinovirus / Enterovirus NOT DETECTED NOT DETECTED Final   Influenza A NOT DETECTED NOT DETECTED Final   Influenza B NOT DETECTED NOT DETECTED Final   Parainfluenza Virus 1 NOT DETECTED NOT DETECTED Final   Parainfluenza Virus 2 NOT DETECTED NOT DETECTED Final   Parainfluenza Virus 3 NOT DETECTED NOT DETECTED Final   Parainfluenza Virus 4 NOT DETECTED NOT DETECTED Final   Respiratory Syncytial Virus NOT DETECTED NOT DETECTED Final   Bordetella pertussis NOT DETECTED NOT DETECTED Final   Chlamydophila pneumoniae NOT DETECTED NOT DETECTED Final   Mycoplasma pneumoniae NOT DETECTED NOT DETECTED Final    Comment: Performed at [facility], [place] [address] [place],  [address]     Labs: Basic Metabolic Panel:  Recent Labs Lab 05/29/17 1631 05/30/17 0320 06/01/17 0531 06/02/17 0610  NA 141 141 142 143  K 4.1 4.2 4.1 3.8  CL 106 107 107 107  CO2 27 25 25 27   GLUCOSE 136* 182* 174* 142*  BUN 28* 24* 30* 26*  CREATININE 1.05* 0.96 0.87 0.86  CALCIUM 9.4 9.1 9.3 8.9  MG  --  2.1  --   --   PHOS  --  3.5  --   --    Liver Function Tests:  Recent Labs Lab 05/30/17 0320  AST 22  ALT 20  ALKPHOS 53  BILITOT  0.2*  PROT 6.3*  ALBUMIN 3.4*   CBC:  Recent Labs Lab 05/29/17 1631 05/30/17 0320 06/01/17 0531 06/02/17 0610  WBC 5.1 5.6 8.8 6.5  NEUTROABS 2.8  --   --   --   HGB 12.1 11.7* 12.7 12.7  HCT 38.5 37.4 41.3 40.0  MCV 86.1 86.0 85.9 85.1  PLT 149* 170 208 186   Cardiac Enzymes:  Recent Labs Lab 05/29/17 1631 05/29/17 2157 05/30/17 0320 05/30/17 1020  TROPONINI <0.03 <0.03 <0.03 <0.03   BNP: BNP (last 3 results)  Recent Labs  06/15/16 1316  BNP 119.4*    ProBNP (last 3 results) No results for input(s): PROBNP in the last 8760 hours.  CBG:  Recent Labs Lab 06/04/17 1119 06/04/17 1702 06/04/17 2144 06/05/17 0746 06/05/17 1144  GLUCAP 112* 136* 150* 133* 116*     SIGNED: Time coordinating discharge: 60 minutes  Faye Ramsay, MD  [facility] 06/05/2017, 3:45 PM Pager [phone]  If 7PM-7AM, please contact night-coverage [email or web address] Password [email or web address]

## 2017-06-05 NOTE — Discharge Instructions (Signed)

## 2017-06-05 NOTE — Evaluation (Signed)
Clinical/Bedside Swallow Evaluation Patient Details  Name: [name] MRN: [id number] Date of Birth: [birth year]  Today's Date: 06/05/2017 Time: SLP Start Time (ACUTE ONLY): 1055 SLP Stop Time (ACUTE ONLY): 1136 SLP Time Calculation (min) (ACUTE ONLY): 41 min  Past Medical History:  Past Medical History:  Diagnosis Date  . Acute blood loss anemia   . Allergic rhinitis   . Anxiety   . Anxiety   . Asthma   . 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 CAD (coronary artery disease)   . Chronic pain syndrome   . Coronary artery disease   . Diabetes mellitus without complication ([place])   . Essential tremor   . GAD (generalized anxiety disorder)   . GERD (gastroesophageal reflux disease)   . H/O cardiovascular stress test 06-26-2012   compared to previous study there is no significant change; normal myocardial perfusion study. no significant ischemia demonstrated . this is a low risk scan  . H/O echocardiogram 06-26-2012   EF 55%  . History of cardiac cath 09-30-1998   cath done by Dr. Claiborne Billings, successful rotation atherectomy of the left anterior decending artert with reduction from 80 to105  . History of Doppler ultrasound 03-30-2008   neg abd. doppler for aneurysm  . History of Holter monitoring 12-22-2007   NSR with pac occs. pvc's  . HLD (hyperlipidemia)   . Hx of cardiac cath 09-26-1998   dignostic cath dr. Claiborne Billings to do rotoblator at a later date  . Hypertension   . Hypothyroidism   . Insomnia   . Osteoarthritis   . Osteopenia   . Partial small bowel obstruction ([place])   . Physical deconditioning   . Sepsis due to pneumonia [facility])    Past Surgical History:  Past Surgical History:  Procedure Laterality Date  . APPENDECTOMY  1947  . Bladder Tack  1970   Dr Rosana Hoes  . BREAST LUMPECTOMY  1970   Left, Dr Leandrew Koyanagi  . DILATION AND CURETTAGE OF UTERUS  1960  . LAPAROSCOPY N/A 01/19/2016   Procedure: LAPAROSCOPY DIAGNOSTIC;  Surgeon: Greer Pickerel, MD;  Location: [place];  Service: General;  Laterality: N/A;  . LYSIS OF ADHESION N/A 01/19/2016   Procedure: LYSIS OF ADHESION;  Surgeon: Greer Pickerel, MD;  Location: [place];  Service: General;  Laterality: N/A;  . NASAL SINUS SURGERY  1980   Left; for chronic sinusitis  . REVISION TOTAL KNEE ARTHROPLASTY  2005   Dr Alvan Dame Right knee  . VESICOVAGINAL FISTULA CLOSURE W/ TAH  1971   Dr Mallie Mussel   HPI:  83 yo female adm to [facility] with acute on chronic respiratory infection.  PMH + for GERD, bronchitis, HTN, CAD, T2DM.  Pt has chronic lung changes with moderate stool burden per imaging studies in the house.  Pt UGI 10/2012 showed reflux to thoracic region, very prominent reflux, 4 cm sliding hiatal hernia.    Assessment / Plan / Recommendation Clinical Impression  Pt with negative CN exam regarding swallow musculature and has significant h/o reflux to thoracic region as well as HH per UGI study in 2013.  Her largest complaint currently is difficulty swallowing pills and throat pain.  Of note, pt reported issues with refluxing in the middle of the night - causing her to wake with acidic taste in her mouth.  She also admits to increased abdomen distention and "fullness" today - suspect this may exacerbate her symptoms.  Pt's largest source of aspiration risk likely  due to known h/o reflux.  Pt observed with 3 ounce water test that she did not pass - due to requiring rest break in between and throat clearing post=swallow.  Pt does cough/clear throat before, during and after intake (Ensure, pills, graham cracker and water consumed) but do not suspect significant oropharyngeal dysphagia causing aspiration.  Given pt report of dyspnea with po and known reflux provided her with compensation strategies to mitigate dysphagia.  Pt reports she has an appointment to see her GI MD October 30th and SLP encouraged her to keep this appointment.   SLP Visit Diagnosis: Dysphagia, unspecified (R13.10)    Aspiration Risk   Mild aspiration risk    Diet Recommendation Regular;Thin liquid (to allow pt to order foods she can manage)   Liquid Administration via: Cup;Straw Medication Administration:  (whole with thicker liquids) Supervision: Patient able to self feed Compensations: Slow rate;Small sips/bites (start meals with liquids due to xerostomia) Postural Changes: Seated upright at 90 degrees;Remain upright for at least 30 minutes after po intake    Other  Recommendations Oral Care Recommendations: Oral care BID   Follow up Recommendations   n/a - pt to follow up with GI as OP on Oct 30th      Frequency and Duration     n/a       Prognosis        Swallow Study   General Date of Onset: 06/05/17 HPI: 83 yo female adm to [facility] with acute on chronic respiratory infection.  PMH + for GERD, bronchitis, HTN, CAD, T2DM.  Pt has chronic lung changes with moderate stool burden per imaging studies in the house.  Pt UGI 10/2012 showed reflux to thoracic region, very prominent reflux, 4 cm sliding hiatal hernia.  Type of Study: Bedside Swallow Evaluation Diet Prior to this Study: Regular;Thin liquids Temperature Spikes Noted: No Respiratory Status: Room air History of Recent Intubation: No Behavior/Cognition: Alert;Cooperative;Pleasant mood Oral Cavity Assessment: Within Functional Limits Oral Care Completed by SLP: No Oral Cavity - Dentition: Adequate natural dentition Vision: Functional for self-feeding Self-Feeding Abilities: Able to feed self Patient Positioning: Upright in bed Baseline Vocal Quality: Normal Volitional Cough: Strong Volitional Swallow: Able to elicit    Oral/Motor/Sensory Function Overall Oral Motor/Sensory Function: Within functional limits   Ice Chips Ice chips: Not tested   Thin Liquid Thin Liquid: Within functional limits Presentation: Cup;Self Fed    Nectar Thick Nectar Thick Liquid: Within functional limits Presentation: Cup;Self Fed   Honey Thick Honey Thick Liquid: Not  tested   Puree Puree: Not tested   Solid   GO   Solid: Within functional limits Presentation: Self Fredirick Lathe 06/05/2017,11:57 AM   Luanna Salk, [place] [facility] SLP [phone]

## 2017-06-05 NOTE — Progress Notes (Signed)
Occupational Therapy Treatment Patient Details Name: [name] MRN: [id number] DOB: 1925/02/23 Today's Date: 06/05/2017    History of present illness 83 y.o. female with medical history significant of Asthma, R TKA, exploratory laparotomy 2* SBO 01/2016, CAD, DM2, bronchitis, GERD, HTN. Dx asthma exacerbation   OT comments  Pt improving daily  Follow Up Recommendations  Home health OT;Supervision/Assistance - 24 hour    Equipment Recommendations  None recommended by OT           Mobility Bed Mobility               General bed mobility comments: pt in chair  Transfers Overall transfer level: Needs assistance Equipment used: Rolling walker (2 wheeled) Transfers: Stand Pivot Transfers;Sit to/from Stand Sit to Stand: Supervision Stand pivot transfers: Supervision                ADL either performed or assessed with clinical judgement   ADL Overall ADL's : Needs assistance/impaired             Lower Body Bathing: Supervison/ safety   Upper Body Dressing : Supervision/safety;Sitting   Lower Body Dressing: Min guard;Sit to/from stand;Cueing for safety;Cueing for sequencing   Toilet Transfer: Supervision/safety;Ambulation;RW;Cueing for sequencing   Toileting- Clothing Manipulation and Hygiene: Supervision/safety;Sit to/from stand;Cueing for safety;Cueing for sequencing       Functional mobility during ADLs: Min guard;Cueing for safety;Cueing for sequencing;Rolling walker General ADL Comments: Pt performed functional mobility and demonstated good safety awareness with walker               Cognition Arousal/Alertness: Awake/alert Behavior During Therapy: WFL for tasks assessed/performed Overall Cognitive Status: Within Functional Limits for tasks assessed                                                     Pertinent Vitals/ Pain       Pain Assessment: No/denies pain         Frequency  Min 2X/week        Progress  Toward Goals  OT Goals(current goals can now be found in the care plan section)  Progress towards OT goals: Progressing toward goals     Plan Discharge plan remains appropriate       AM-PAC PT "6 Clicks" Daily Activity     Outcome Measure   Help from another person eating meals?: None Help from another person taking care of personal grooming?: A Little Help from another person toileting, which includes using toliet, bedpan, or urinal?: A Little Help from another person bathing (including washing, rinsing, drying)?: A Little Help from another person to put on and taking off regular upper body clothing?: A Little Help from another person to put on and taking off regular lower body clothing?: A Little 6 Click Score: 19    End of Session Equipment Utilized During Treatment: Rolling walker  OT Visit Diagnosis: Unsteadiness on feet (R26.81);Muscle weakness (generalized) (M62.81)   Activity Tolerance Patient tolerated treatment well   Patient Left in chair;with chair alarm set   Nurse Communication Mobility status        Time: 2993-7169 OT Time Calculation (min): 20 min  Charges: OT General Charges $OT Visit: 1 Visit OT Treatments $Self Care/Home Management : 8-22 mins  [place], [place]   Betsy Pries 06/05/2017, 12:52 PM

## 2017-06-05 NOTE — Progress Notes (Signed)
SATURATION QUALIFICATIONS: (This note is used to comply with regulatory documentation for home oxygen)  Patient Saturations on Room Air at Rest = 98%  Patient Saturations on Room Air while Ambulating = 96%  Patient Saturations on 0 Liters of oxygen while Ambulating = %  Patient did not need oxygen therapy

## 2017-06-05 NOTE — Progress Notes (Signed)
Physical Therapy Treatment Patient Details Name: [name] MRN: [id number] DOB: [birth year] Today's Date: 06/05/2017    History of Present Illness 83 y.o. female with medical history significant of Asthma, R TKA, exploratory laparotomy 2* SBO 01/2016, CAD, DM2, bronchitis, GERD, HTN. Dx asthma exacerbation    PT Comments    Assisted out of recliner to amb in hallway a slow but steady gait with RW.  Pt stated she uses a rollator at home.     Follow Up Recommendations  Home health PT;Supervision for mobility/OOB     Equipment Recommendations  None recommended by PT    Recommendations for Other Services       Precautions / Restrictions Precautions Precautions: Fall Restrictions Weight Bearing Restrictions: No    Mobility  Bed Mobility Overal bed mobility: Needs Assistance Bed Mobility: Sit to Supine       Sit to supine: Min guard;Supervision   General bed mobility comments: assisted back to bed with increased time  Transfers Overall transfer level: Needs assistance Equipment used: Rolling walker (2 wheeled) Transfers: Stand Pivot Transfers;Sit to/from Stand Sit to Stand: Supervision;Min guard Stand pivot transfers: Supervision       General transfer comment: good use of hands to steady self just requires increased time  Ambulation/Gait Ambulation/Gait assistance: Supervision;Min guard Ambulation Distance (Feet): 26 Feet Assistive device: Rolling walker (2 wheeled) Gait Pattern/deviations: Step-through pattern;Decreased stride length Gait velocity: decreased   General Gait Details: slow but steady gait, pt demo's good carryover from previous session with improved RW safety awareness when maneuvering in tighter spaces   Stairs            Wheelchair Mobility    Modified Rankin (Stroke Patients Only)       Balance                                            Cognition Arousal/Alertness: Awake/alert Behavior During Therapy:  WFL for tasks assessed/performed Overall Cognitive Status: Within Functional Limits for tasks assessed                                        Exercises      General Comments        Pertinent Vitals/Pain Pain Assessment: Faces Faces Pain Scale: Hurts a little bit Pain Location: L knee OA Pain Descriptors / Indicators: Grimacing Pain Intervention(s): Monitored during session    Home Living                      Prior Function            PT Goals (current goals can now be found in the care plan section) Progress towards PT goals: Progressing toward goals    Frequency    Min 3X/week      PT Plan Current plan remains appropriate    Co-evaluation              AM-PAC PT "6 Clicks" Daily Activity  Outcome Measure  Difficulty turning over in bed (including adjusting bedclothes, sheets and blankets)?: A Little Difficulty moving from lying on back to sitting on the side of the bed? : A Little Difficulty sitting down on and standing up from a chair with arms (e.g., wheelchair, bedside commode, etc,.)?: A Little Help needed  moving to and from a bed to chair (including a wheelchair)?: A Little Help needed walking in hospital room?: A Little Help needed climbing 3-5 steps with a railing? : A Lot 6 Click Score: 17    End of Session Equipment Utilized During Treatment: Gait belt Activity Tolerance: Patient tolerated treatment well Patient left: in bed;with bed alarm set;with family/visitor present;with call bell/phone within reach Nurse Communication: Mobility status PT Visit Diagnosis: Muscle weakness (generalized) (M62.81)     Time: 1344-1400 PT Time Calculation (min) (ACUTE ONLY): 16 min  Charges:  $Gait Training: 8-22 mins                    G Codes:       Rica Koyanagi  PTA [facility]  Acute  Rehab Pager      [phone]

## 2017-06-06 ENCOUNTER — Other Ambulatory Visit: Payer: Self-pay | Admitting: *Deleted

## 2017-06-06 DIAGNOSIS — J4520 Mild intermittent asthma, uncomplicated: Secondary | ICD-10-CM

## 2017-06-06 LAB — GLUCOSE, CAPILLARY: Glucose-Capillary: 128 mg/dL — ABNORMAL HIGH (ref 65–99)

## 2017-06-06 MED ORDER — FUROSEMIDE 20 MG PO TABS: ORAL_TABLET | ORAL | 6 refills | Status: AC

## 2017-06-06 NOTE — Progress Notes (Signed)
Date: June 06, 2017 Discharge orders review for case management needs.  [facility] notified of need for RN,OT,PT./representative notified/pt will be going to her brother's home contact number is [phone]. Per patient or family member no additional needs at home. [name], BSN, [place], CCM:  [phone]

## 2017-06-06 NOTE — Progress Notes (Signed)
Patient is seen and examined with RN and family in room. She is feeling better, on room air. Orthostatic vital sign wnl, she denies dizziness and she feels ready to go home. She is to discharg home today with home health and follow up with pmd and pulmonology, detail discharge summary was done by Dr Doyle Askew.

## 2017-06-10 DIAGNOSIS — E039 Hypothyroidism, unspecified: Secondary | ICD-10-CM | POA: Diagnosis not present

## 2017-06-10 DIAGNOSIS — J9620 Acute and chronic respiratory failure, unspecified whether with hypoxia or hypercapnia: Secondary | ICD-10-CM | POA: Diagnosis not present

## 2017-06-10 DIAGNOSIS — J45901 Unspecified asthma with (acute) exacerbation: Secondary | ICD-10-CM | POA: Diagnosis not present

## 2017-06-10 DIAGNOSIS — K219 Gastro-esophageal reflux disease without esophagitis: Secondary | ICD-10-CM | POA: Diagnosis not present

## 2017-06-10 DIAGNOSIS — I5022 Chronic systolic (congestive) heart failure: Secondary | ICD-10-CM | POA: Diagnosis not present

## 2017-06-10 DIAGNOSIS — G894 Chronic pain syndrome: Secondary | ICD-10-CM | POA: Diagnosis not present

## 2017-06-10 DIAGNOSIS — E119 Type 2 diabetes mellitus without complications: Secondary | ICD-10-CM | POA: Diagnosis not present

## 2017-06-10 DIAGNOSIS — M1990 Unspecified osteoarthritis, unspecified site: Secondary | ICD-10-CM | POA: Diagnosis not present

## 2017-06-10 DIAGNOSIS — E785 Hyperlipidemia, unspecified: Secondary | ICD-10-CM | POA: Diagnosis not present

## 2017-06-10 DIAGNOSIS — I2510 Atherosclerotic heart disease of native coronary artery without angina pectoris: Secondary | ICD-10-CM | POA: Diagnosis not present

## 2017-06-10 DIAGNOSIS — I110 Hypertensive heart disease with heart failure: Secondary | ICD-10-CM | POA: Diagnosis not present

## 2017-06-10 DIAGNOSIS — F419 Anxiety disorder, unspecified: Secondary | ICD-10-CM | POA: Diagnosis not present

## 2017-06-10 DIAGNOSIS — Z7982 Long term (current) use of aspirin: Secondary | ICD-10-CM | POA: Diagnosis not present

## 2017-06-11 DIAGNOSIS — G894 Chronic pain syndrome: Secondary | ICD-10-CM | POA: Diagnosis not present

## 2017-06-11 DIAGNOSIS — I2510 Atherosclerotic heart disease of native coronary artery without angina pectoris: Secondary | ICD-10-CM | POA: Diagnosis not present

## 2017-06-11 DIAGNOSIS — I5022 Chronic systolic (congestive) heart failure: Secondary | ICD-10-CM | POA: Diagnosis not present

## 2017-06-11 DIAGNOSIS — I110 Hypertensive heart disease with heart failure: Secondary | ICD-10-CM | POA: Diagnosis not present

## 2017-06-11 DIAGNOSIS — E119 Type 2 diabetes mellitus without complications: Secondary | ICD-10-CM | POA: Diagnosis not present

## 2017-06-11 DIAGNOSIS — J45901 Unspecified asthma with (acute) exacerbation: Secondary | ICD-10-CM | POA: Diagnosis not present

## 2017-06-13 ENCOUNTER — Ambulatory Visit (INDEPENDENT_AMBULATORY_CARE_PROVIDER_SITE_OTHER): Payer: Medicare Other | Admitting: Internal Medicine

## 2017-06-13 ENCOUNTER — Other Ambulatory Visit (INDEPENDENT_AMBULATORY_CARE_PROVIDER_SITE_OTHER): Payer: Medicare Other

## 2017-06-13 ENCOUNTER — Encounter: Payer: Self-pay | Admitting: Internal Medicine

## 2017-06-13 VITALS — BP 112/78 | HR 87 | Ht 61.0 in | Wt 187.8 lb

## 2017-06-13 DIAGNOSIS — J4531 Mild persistent asthma with (acute) exacerbation: Secondary | ICD-10-CM | POA: Diagnosis not present

## 2017-06-13 DIAGNOSIS — I2510 Atherosclerotic heart disease of native coronary artery without angina pectoris: Secondary | ICD-10-CM

## 2017-06-13 DIAGNOSIS — J4521 Mild intermittent asthma with (acute) exacerbation: Secondary | ICD-10-CM

## 2017-06-13 LAB — CBC WITH DIFFERENTIAL/PLATELET
BASOS ABS: 0.1 10*3/uL (ref 0.0–0.1)
BASOS PCT: 1 % (ref 0.0–3.0)
EOS ABS: 0.2 10*3/uL (ref 0.0–0.7)
Eosinophils Relative: 2.9 % (ref 0.0–5.0)
HCT: 40.8 % (ref 36.0–46.0)
HEMOGLOBIN: 12.7 g/dL (ref 12.0–15.0)
Lymphocytes Relative: 21.6 % (ref 12.0–46.0)
Lymphs Abs: 1.5 10*3/uL (ref 0.7–4.0)
MCHC: 31.3 g/dL (ref 30.0–36.0)
MCV: 85.1 fl (ref 78.0–100.0)
MONO ABS: 0.8 10*3/uL (ref 0.1–1.0)
Monocytes Relative: 11.4 % (ref 3.0–12.0)
Neutro Abs: 4.3 10*3/uL (ref 1.4–7.7)
Neutrophils Relative %: 63.1 % (ref 43.0–77.0)
Platelets: 188 10*3/uL (ref 150.0–400.0)
RBC: 4.79 Mil/uL (ref 3.87–5.11)
RDW: 14.2 % (ref 11.5–15.5)
WBC: 6.8 10*3/uL (ref 4.0–10.5)

## 2017-06-13 LAB — BASIC METABOLIC PANEL
BUN: 19 mg/dL (ref 6–23)
CO2: 33 mEq/L — ABNORMAL HIGH (ref 19–32)
Calcium: 9.8 mg/dL (ref 8.4–10.5)
Chloride: 102 mEq/L (ref 96–112)
Creatinine, Ser: 1.03 mg/dL (ref 0.40–1.20)
GFR: 53.49 mL/min — AB (ref >60.00)
GLUCOSE: 130 mg/dL — AB (ref 70–99)
POTASSIUM: 5.3 meq/L — AB (ref 3.5–5.1)
SODIUM: 141 meq/L (ref 135–145)

## 2017-06-13 NOTE — Progress Notes (Signed)
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 Air-trapping with increased residual volume. Diffusion mildly reduced. FVC 1.61/76%, 11.05/77%, FEV1/FVC 0.65, FEF 25-75% 0.56/33%. RV 134%, DLCO 76%.  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 States she has been bothered with allergies, congestion, and post-nasal drip. States she felt a "knot on left of neck" that was sore. Hasn't felt it since then.  She reports "misery"-same old postnasal drip. 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 Pt has F/U on 06-24-17 with PCP. Needs BMP and CBC lab work. Pt states she feels better and can breathe better but staying weak.  Post Hospital-ED visit 10/10 with dyspnea wheezing and cough diagnosis asthma exacerbation She wanted to get post Hospital lab work done so could be available when she sees PCP next week. Still mild chest congestion and cough after recent exacerbation, but nonproductive with no fever. Does not feel she has an infection. Notes feet swelling as she is sedentary in a chair most of each day now. We reviewed her CT scan report. Never smoker, now 83 years old CT chest 06/02/17 IMPRESSION: Small bilateral pulmonary nodules measuring up to 4 mm in the right lower lobe. Non-contrast chest CT can be considered in 12 months in this high-risk patient. This recommendation follows the consensus statement: Guidelines for Management of  Incidental Pulmonary Nodules Detected on CT Images: From the Fleischner Society 2017; Radiology 2017; 284:228-243. No evidence of acute cardiopulmonary disease.  ROS-see HPI  + = positive Constitutional:   No-   weight loss, night sweats, fevers, chills, fatigue, lassitude. HEENT:   No-  headaches, difficulty swallowing, tooth/dental problems, sore throat,       No-  sneezing, itching, ear ache, +nasal congestion, +post nasal drip,  CV:  No-   chest pain, orthopnea, PND, swelling in lower extremities, anasarca,                                                        dizziness, palpitations Resp:+ shortness of breath with exertion or at rest.              No-   productive cough,  + non-productive cough,  No- coughing up of blood.              No-   change in color of mucus.  + wheezing.   Skin: No-   rash or lesions. GI:  No-   heartburn, indigestion, abdominal pain, nausea, vomiting,  GU:  MS:  No-   joint pain or swelling.   Neuro-     nothing unusual Psych:  No- change in mood or affect. No depression or anxiety.  No memory loss.  OBJ- Physical Exam General- Alert, Oriented, Affect-appropriate, Distress- none acute, + obese, + elderly Skin- rash-none, lesions- none, excoriation- none Lymphadenopathy- none Head- atraumatic            Eyes- Gross vision intact, PERRLA, conjunctivae and secretions clear  Ears- Hearing, canals-normal            Nose- Clear, no-Septal dev, mucus, polyps, erosion, perforation             Throat- Mallampati III , mucosa clear , drainage- none, tonsils- atrophic, + bad teeth-some extractions Neck- flexible , trachea midline, no stridor , thyroid nl, carotid no bruit Chest - symmetrical excursion , unlabored           Heart/CV- RRR , no murmur heard , no gallop  , no rub, nl s1 s2                           - JVD- none , edema 2+, stasis changes- none, varices- none           Lung- unlabored, wheeze-none, cough-none, dullness-none, rub- none.             Chest wall-  Abd-  Br/ Gen/ Rectal- Not done, not indicated Extrem- cyanosis- none, clubbing, none, atrophy- none, strength- nl. + Rolling walker Neuro- grossly intact to observation

## 2017-06-13 NOTE — Patient Instructions (Signed)
Order- lab-    CBC w diff, BMET      Dx exacerbation asthmatic bronchitis,   Ok to continue present inhaled meds  Please call as needed

## 2017-06-14 ENCOUNTER — Telehealth: Payer: Self-pay | Admitting: Internal Medicine

## 2017-06-14 DIAGNOSIS — E119 Type 2 diabetes mellitus without complications: Secondary | ICD-10-CM | POA: Diagnosis not present

## 2017-06-14 DIAGNOSIS — I2510 Atherosclerotic heart disease of native coronary artery without angina pectoris: Secondary | ICD-10-CM | POA: Diagnosis not present

## 2017-06-14 DIAGNOSIS — I110 Hypertensive heart disease with heart failure: Secondary | ICD-10-CM | POA: Diagnosis not present

## 2017-06-14 DIAGNOSIS — I5022 Chronic systolic (congestive) heart failure: Secondary | ICD-10-CM | POA: Diagnosis not present

## 2017-06-14 DIAGNOSIS — G894 Chronic pain syndrome: Secondary | ICD-10-CM | POA: Diagnosis not present

## 2017-06-14 DIAGNOSIS — J45901 Unspecified asthma with (acute) exacerbation: Secondary | ICD-10-CM | POA: Diagnosis not present

## 2017-06-14 NOTE — Telephone Encounter (Signed)
Notes recorded by Deneise Lever, MD on 06/13/2017 at 4:19 PM EDT Labs- CBC blood cell count is normal, with no anemia. The chemistry test shows blood sugar elevated at 130, potassium slightly elevated at 5.3, and CO2(carbonate) elevated, which can be from several reasons.   Spoke with the pt's son and notified of recs per CDY  He verbalized understanding and will inform the pt

## 2017-06-14 NOTE — Progress Notes (Signed)
See PN dated 06/14/17

## 2017-06-15 NOTE — Assessment & Plan Note (Signed)
Resolving recent exacerbation, complicated by CHF. Her chest is now clear and ankle edema is the most obvious finding. Plan-continue her usual asthma regimen.

## 2017-06-15 NOTE — Assessment & Plan Note (Signed)
Considered to have at least a component of left ventricular CHF during recent hospitalization. To be followed up with PCP Plan-we can draw a requested labs-CBC, chemistry-be available for her PCP next week.

## 2017-06-17 DIAGNOSIS — I5022 Chronic systolic (congestive) heart failure: Secondary | ICD-10-CM | POA: Diagnosis not present

## 2017-06-17 DIAGNOSIS — G894 Chronic pain syndrome: Secondary | ICD-10-CM | POA: Diagnosis not present

## 2017-06-17 DIAGNOSIS — I2510 Atherosclerotic heart disease of native coronary artery without angina pectoris: Secondary | ICD-10-CM | POA: Diagnosis not present

## 2017-06-17 DIAGNOSIS — E119 Type 2 diabetes mellitus without complications: Secondary | ICD-10-CM | POA: Diagnosis not present

## 2017-06-17 DIAGNOSIS — J45901 Unspecified asthma with (acute) exacerbation: Secondary | ICD-10-CM | POA: Diagnosis not present

## 2017-06-17 DIAGNOSIS — I110 Hypertensive heart disease with heart failure: Secondary | ICD-10-CM | POA: Diagnosis not present

## 2017-06-18 DIAGNOSIS — R140 Abdominal distension (gaseous): Secondary | ICD-10-CM | POA: Diagnosis not present

## 2017-06-18 DIAGNOSIS — K5901 Slow transit constipation: Secondary | ICD-10-CM | POA: Diagnosis not present

## 2017-06-18 DIAGNOSIS — R110 Nausea: Secondary | ICD-10-CM | POA: Diagnosis not present

## 2017-06-18 IMAGING — CR DG CHEST 2V
2 series · 2 of 2 positions shown · non-contrast
Comparison: 06/25/2015 and 01/20/2015

CLINICAL DATA: Fever, productive cough, and progressive dyspnea.

EXAM:
CHEST  2 VIEW

[w chest lat]
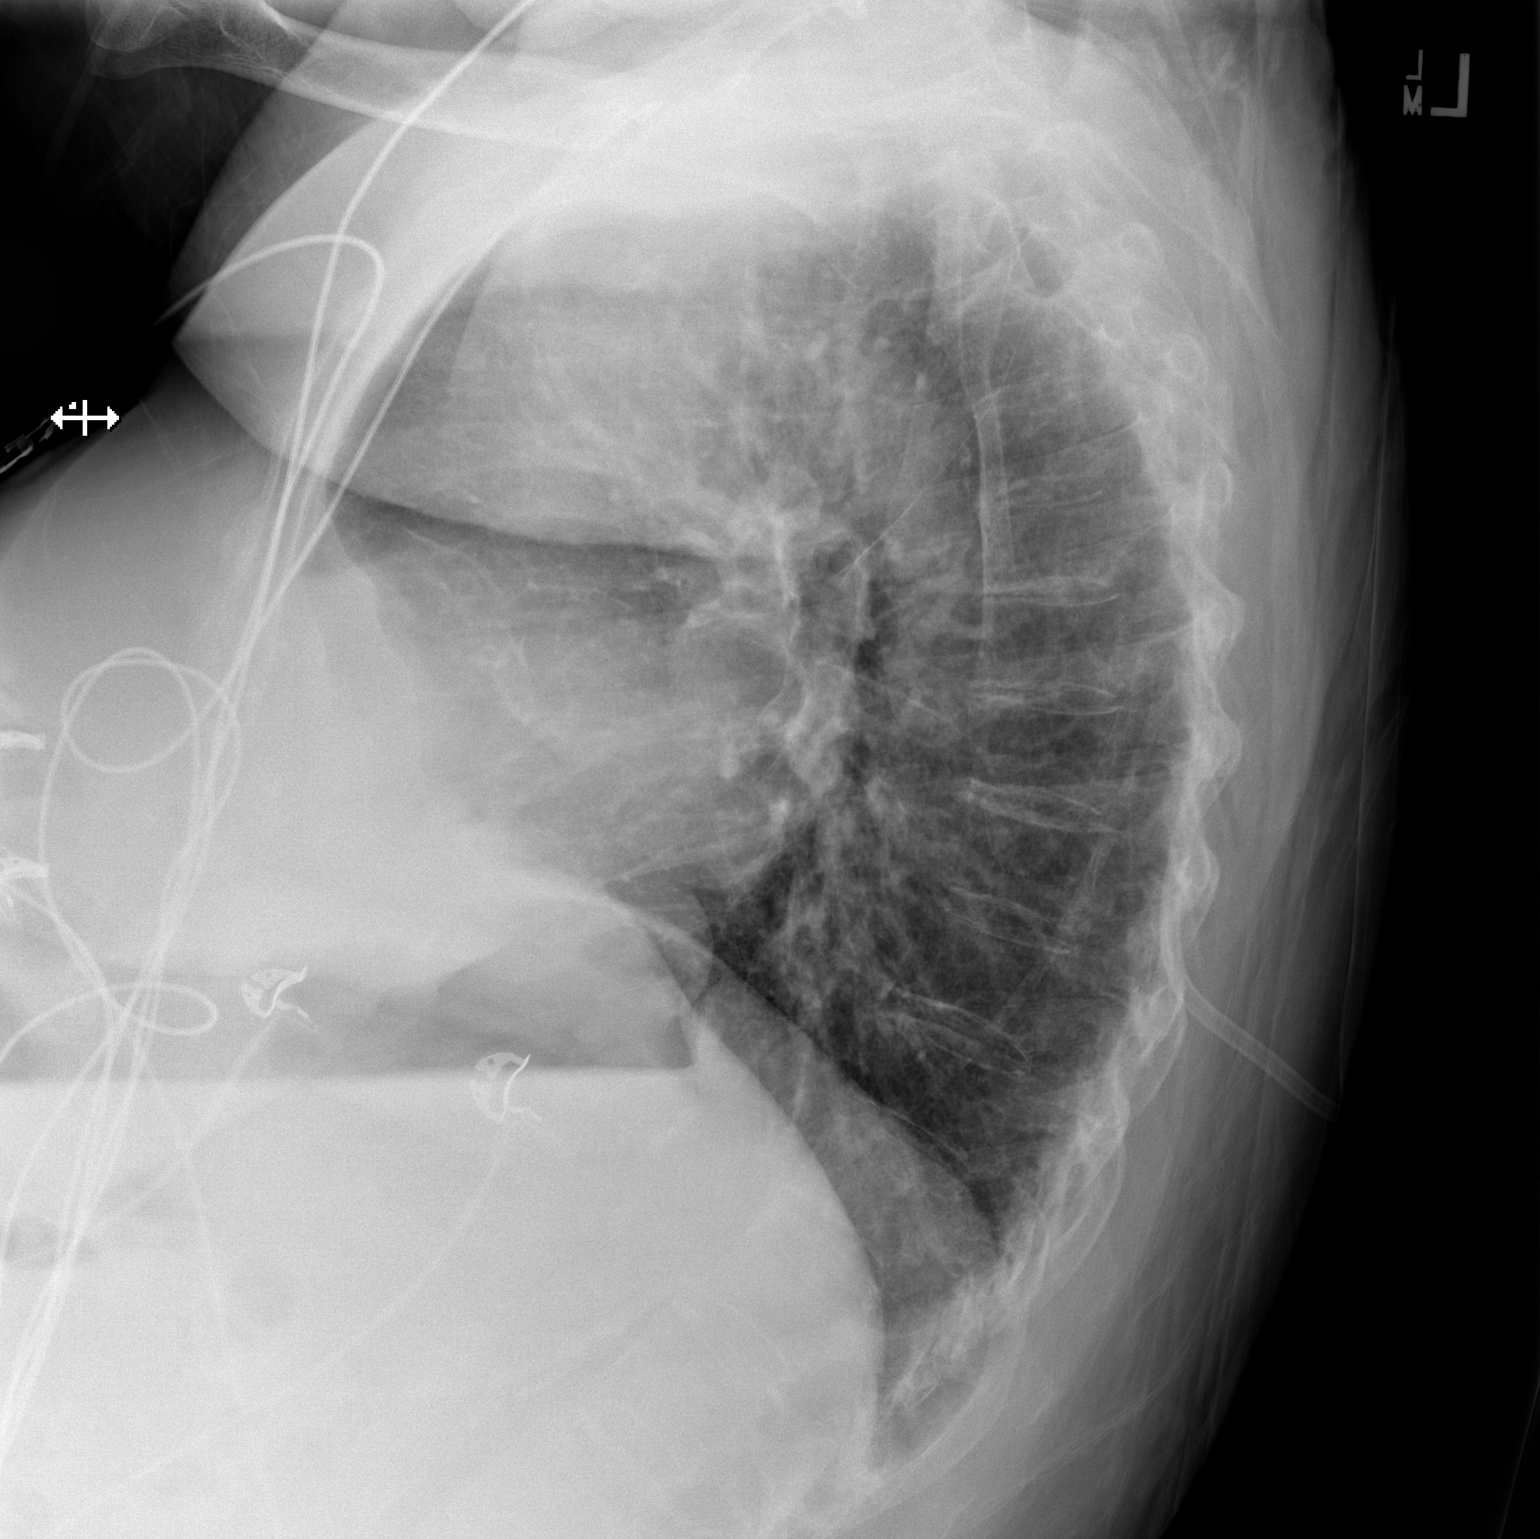

[x chest ap]
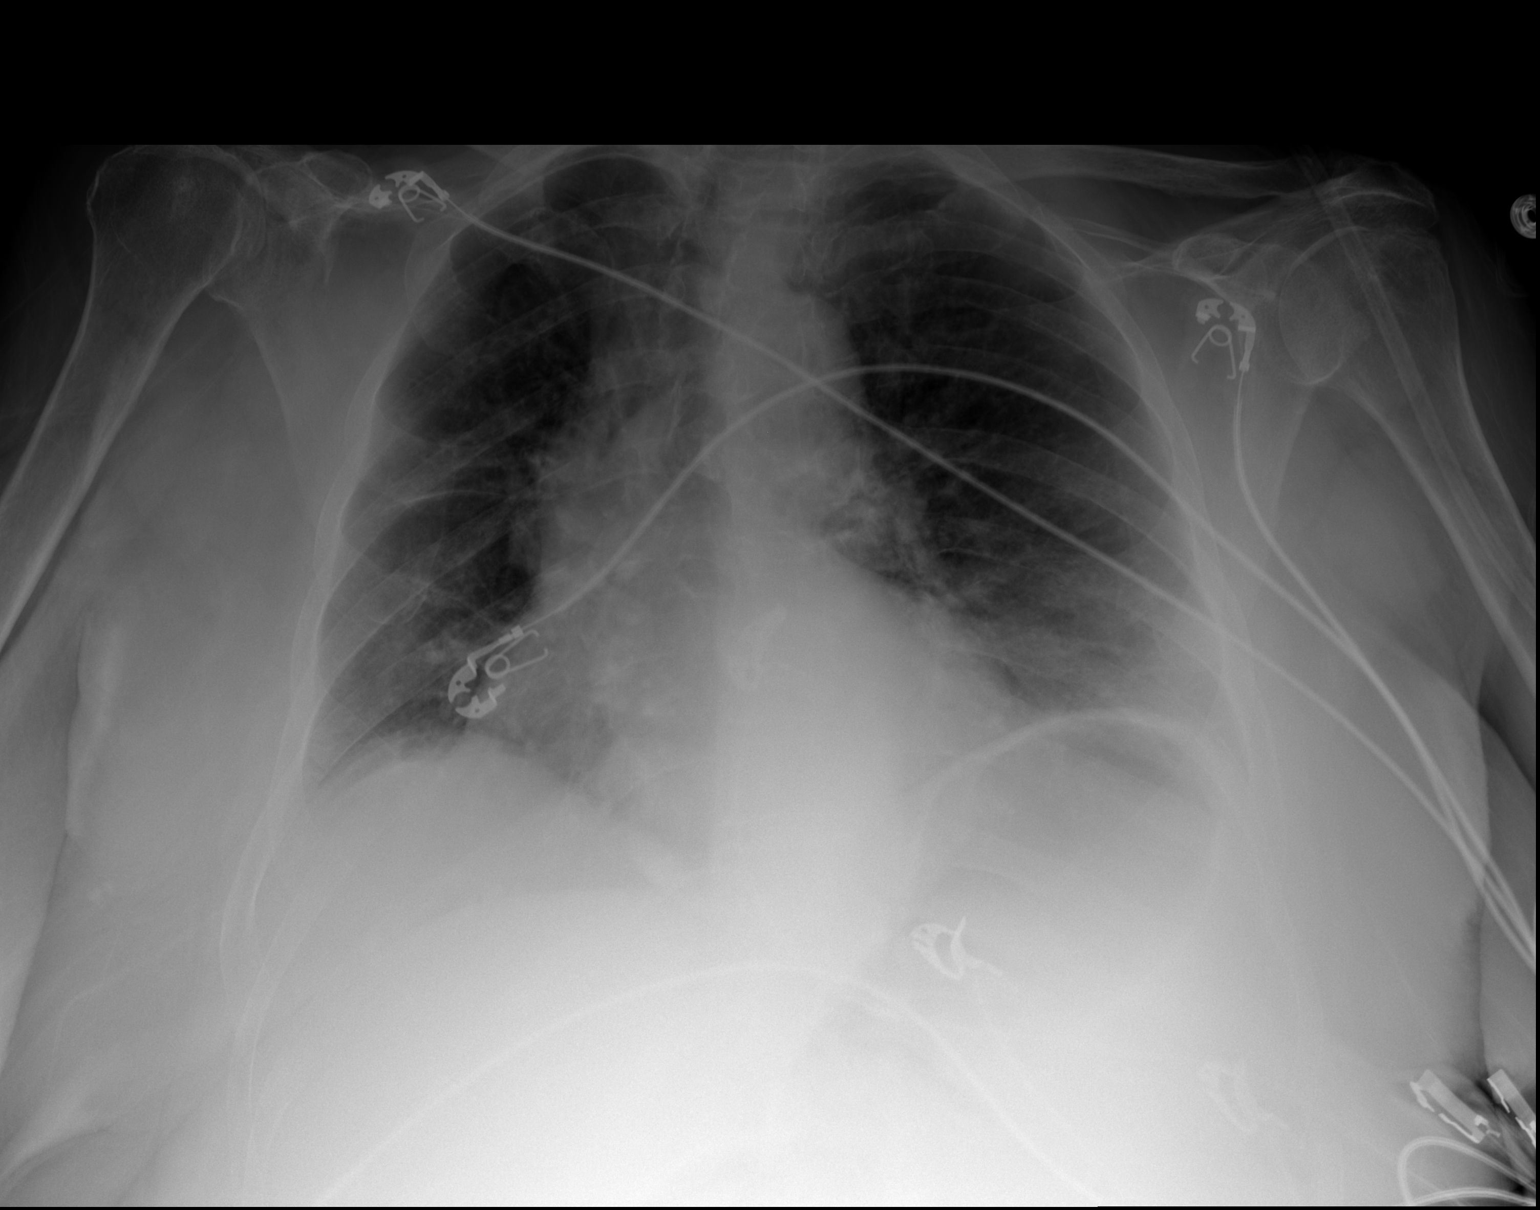

[2 of 2 positions shown; findings below may reference images not displayed]

FINDINGS: There is slight peribronchial thickening. No discrete infiltrates or
effusions. Slight atelectasis at the right lung base. Heart size and
vascularity are normal. No acute osseous abnormality.
IMPRESSION: Bronchitic changes with slight right base atelectasis anteriorly.

## 2017-06-21 ENCOUNTER — Other Ambulatory Visit: Payer: Self-pay | Admitting: Cardiovascular Disease

## 2017-06-21 NOTE — Telephone Encounter (Signed)
REFILL 

## 2017-06-24 DIAGNOSIS — G894 Chronic pain syndrome: Secondary | ICD-10-CM | POA: Diagnosis not present

## 2017-06-24 DIAGNOSIS — I2510 Atherosclerotic heart disease of native coronary artery without angina pectoris: Secondary | ICD-10-CM | POA: Diagnosis not present

## 2017-06-24 DIAGNOSIS — I110 Hypertensive heart disease with heart failure: Secondary | ICD-10-CM | POA: Diagnosis not present

## 2017-06-24 DIAGNOSIS — J45901 Unspecified asthma with (acute) exacerbation: Secondary | ICD-10-CM | POA: Diagnosis not present

## 2017-06-24 DIAGNOSIS — I5022 Chronic systolic (congestive) heart failure: Secondary | ICD-10-CM | POA: Diagnosis not present

## 2017-06-24 DIAGNOSIS — E119 Type 2 diabetes mellitus without complications: Secondary | ICD-10-CM | POA: Diagnosis not present

## 2017-06-25 DIAGNOSIS — G894 Chronic pain syndrome: Secondary | ICD-10-CM | POA: Diagnosis not present

## 2017-06-25 DIAGNOSIS — E119 Type 2 diabetes mellitus without complications: Secondary | ICD-10-CM | POA: Diagnosis not present

## 2017-06-25 DIAGNOSIS — I110 Hypertensive heart disease with heart failure: Secondary | ICD-10-CM | POA: Diagnosis not present

## 2017-06-25 DIAGNOSIS — I5022 Chronic systolic (congestive) heart failure: Secondary | ICD-10-CM | POA: Diagnosis not present

## 2017-06-25 DIAGNOSIS — I2510 Atherosclerotic heart disease of native coronary artery without angina pectoris: Secondary | ICD-10-CM | POA: Diagnosis not present

## 2017-06-25 DIAGNOSIS — J45901 Unspecified asthma with (acute) exacerbation: Secondary | ICD-10-CM | POA: Diagnosis not present

## 2017-07-20 DIAGNOSIS — 419620001 Death: Secondary | SNOMED CT | POA: Diagnosis not present

## 2017-07-20 DEATH — deceased

## 2017-11-20 IMAGING — CR DG NECK SOFT TISSUE
2 series · 2 of 2 positions shown · non-contrast
Comparison: Maxillofacial CT performed 07/19/2012

CLINICAL DATA: Sensation of object stuck in throat, worsening since
[REDACTED]. Pain on swallowing. Initial encounter.

EXAM:
NECK SOFT TISSUES - 1+ VIEW

[w soft tissue neck lat]
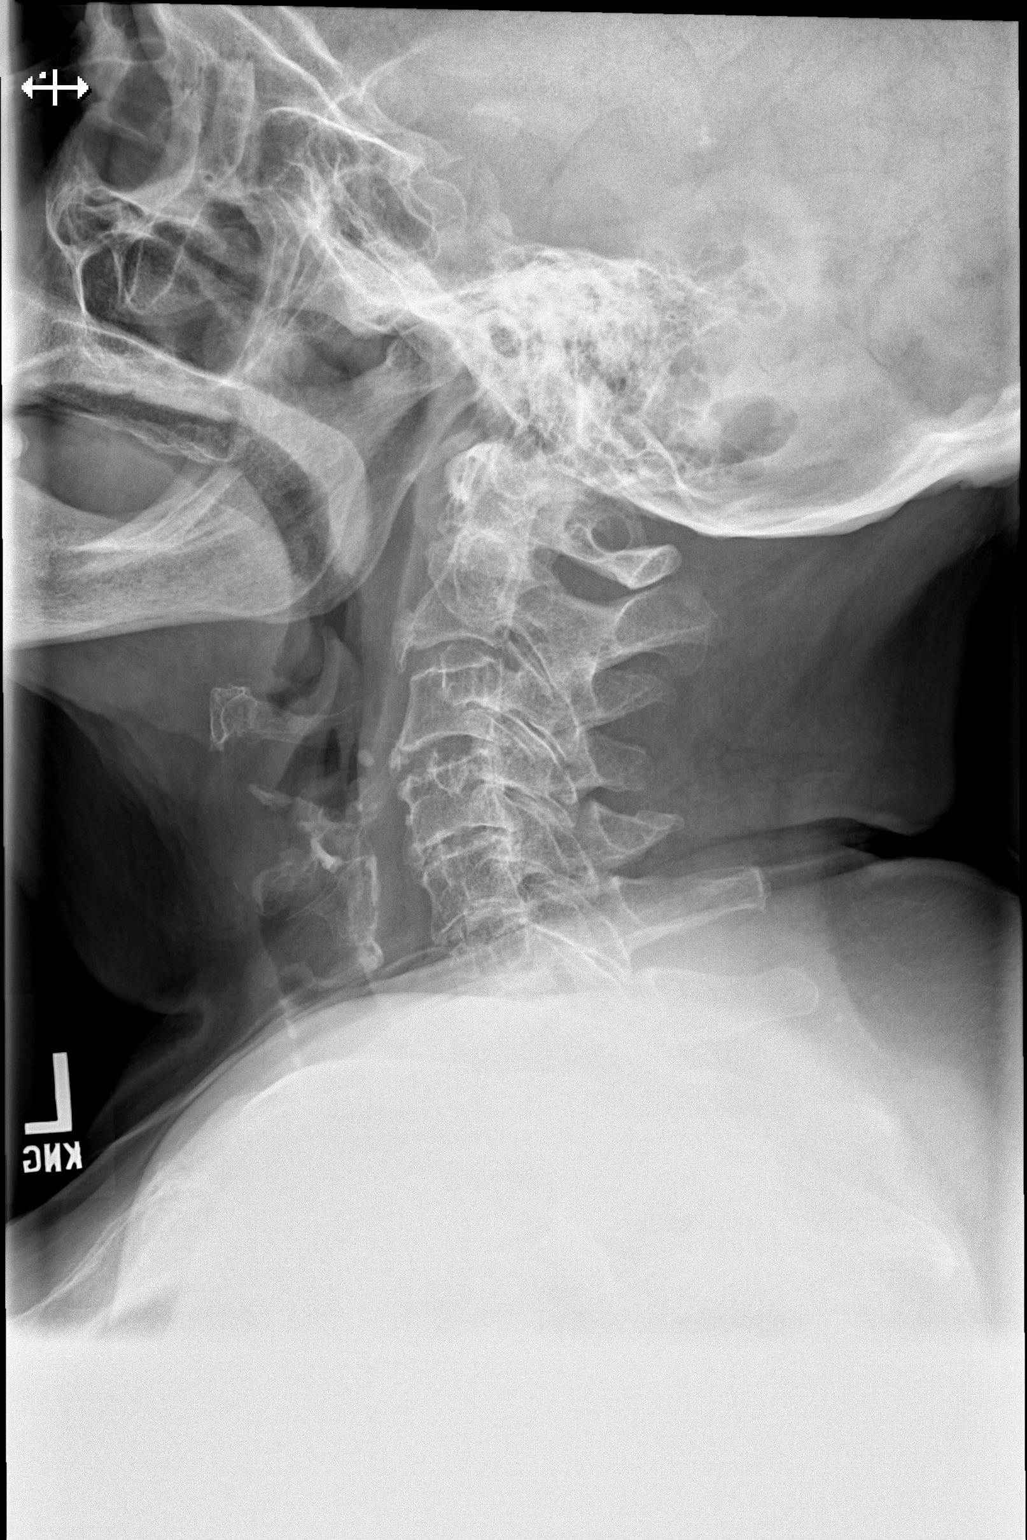

[x soft tissue neck ap]
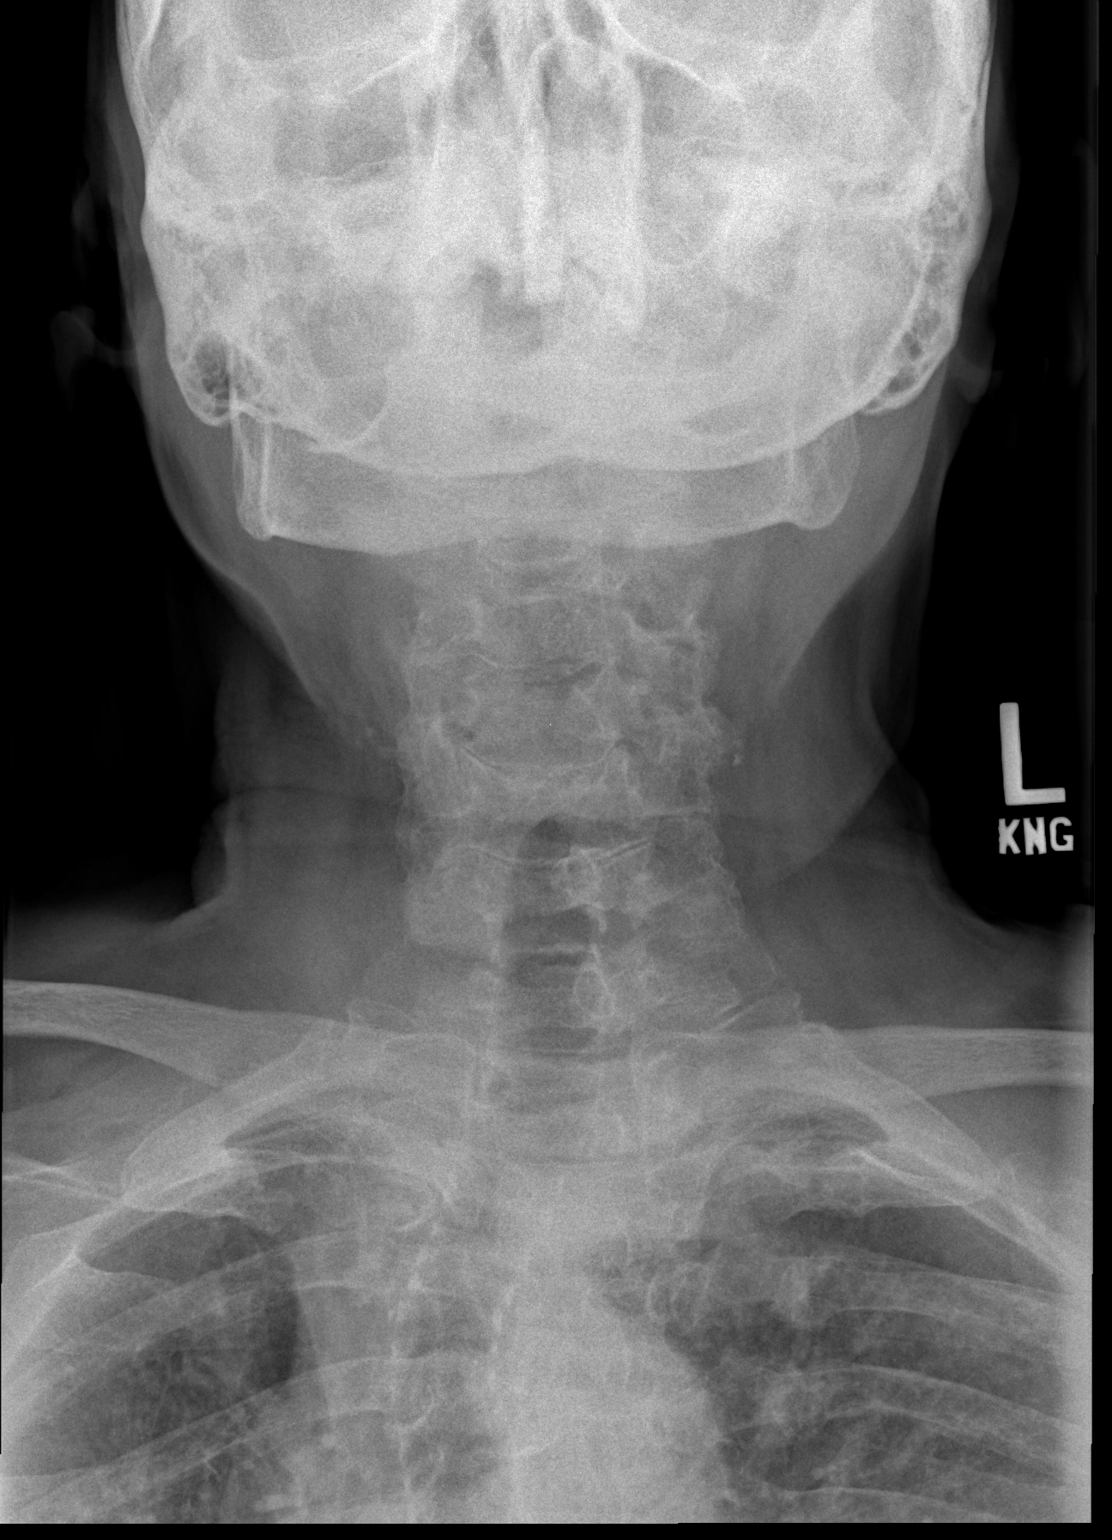

[2 of 2 positions shown; findings below may reference images not displayed]

FINDINGS: The nasopharynx, oropharynx and hypopharynx are grossly
unremarkable. The epiglottis is normal in thickness. Prevertebral
soft tissues are within normal limits.

Mild degenerative change is noted along the mid cervical spine, with
small anterior and posterior osteophytes and minimal disc space
narrowing. The visualized paranasal sinuses and mastoid air cells
are well-aerated. The visualized lung apices are grossly clear.
IMPRESSION: Unremarkable radiographs of the soft tissues of the neck.

## 2017-12-12 ENCOUNTER — Ambulatory Visit: Payer: Self-pay | Admitting: Internal Medicine

## 2018-01-02 ENCOUNTER — Ambulatory Visit: Payer: Self-pay | Admitting: Internal Medicine
